# Patient Record
Sex: Female | Born: 1935 | Race: White | Hispanic: No | Marital: Married
Health system: Midwestern US, Community
[De-identification: ages and names within clinical notes are randomized; demographics above are authoritative.]

## PROBLEM LIST (undated history)

## (undated) DIAGNOSIS — T8549XS Other mechanical complication of breast prosthesis and implant, sequela: Secondary | ICD-10-CM

## (undated) DIAGNOSIS — D239 Other benign neoplasm of skin, unspecified: Secondary | ICD-10-CM

## (undated) DIAGNOSIS — Z9289 Personal history of other medical treatment: Secondary | ICD-10-CM

## (undated) DIAGNOSIS — R2681 Unsteadiness on feet: Secondary | ICD-10-CM

## (undated) DIAGNOSIS — F32A Depression, unspecified: Secondary | ICD-10-CM

## (undated) DIAGNOSIS — R002 Palpitations: Secondary | ICD-10-CM

## (undated) DIAGNOSIS — G8929 Other chronic pain: Secondary | ICD-10-CM

## (undated) DIAGNOSIS — R918 Other nonspecific abnormal finding of lung field: Secondary | ICD-10-CM

## (undated) DIAGNOSIS — D126 Benign neoplasm of colon, unspecified: Secondary | ICD-10-CM

## (undated) DIAGNOSIS — N390 Urinary tract infection, site not specified: Secondary | ICD-10-CM

## (undated) DIAGNOSIS — D249 Benign neoplasm of unspecified breast: Secondary | ICD-10-CM

## (undated) DIAGNOSIS — F419 Anxiety disorder, unspecified: Secondary | ICD-10-CM

## (undated) DIAGNOSIS — C801 Malignant (primary) neoplasm, unspecified: Secondary | ICD-10-CM

## (undated) DIAGNOSIS — R251 Tremor, unspecified: Secondary | ICD-10-CM

## (undated) DIAGNOSIS — A0472 Enterocolitis due to Clostridium difficile, not specified as recurrent: Secondary | ICD-10-CM

## (undated) DIAGNOSIS — F329 Major depressive disorder, single episode, unspecified: Secondary | ICD-10-CM

## (undated) DIAGNOSIS — R634 Abnormal weight loss: Secondary | ICD-10-CM

## (undated) DIAGNOSIS — I10 Essential (primary) hypertension: Secondary | ICD-10-CM

## (undated) DIAGNOSIS — J00 Acute nasopharyngitis [common cold]: Secondary | ICD-10-CM

## (undated) DIAGNOSIS — I509 Heart failure, unspecified: Secondary | ICD-10-CM

## (undated) DIAGNOSIS — N12 Tubulo-interstitial nephritis, not specified as acute or chronic: Secondary | ICD-10-CM

## (undated) DIAGNOSIS — E119 Type 2 diabetes mellitus without complications: Secondary | ICD-10-CM

## (undated) DIAGNOSIS — Z923 Personal history of irradiation: Secondary | ICD-10-CM

## (undated) DIAGNOSIS — S2239XA Fracture of one rib, unspecified side, initial encounter for closed fracture: Secondary | ICD-10-CM

## (undated) DIAGNOSIS — R109 Unspecified abdominal pain: Secondary | ICD-10-CM

## (undated) DIAGNOSIS — C3490 Malignant neoplasm of unspecified part of unspecified bronchus or lung: Secondary | ICD-10-CM

## (undated) DIAGNOSIS — S2249XA Multiple fractures of ribs, unspecified side, initial encounter for closed fracture: Secondary | ICD-10-CM

## (undated) DIAGNOSIS — J449 Chronic obstructive pulmonary disease, unspecified: Secondary | ICD-10-CM

## (undated) DIAGNOSIS — N200 Calculus of kidney: Secondary | ICD-10-CM

## (undated) DIAGNOSIS — K219 Gastro-esophageal reflux disease without esophagitis: Secondary | ICD-10-CM

## (undated) DIAGNOSIS — E785 Hyperlipidemia, unspecified: Secondary | ICD-10-CM

## (undated) DIAGNOSIS — R0602 Shortness of breath: Secondary | ICD-10-CM

## (undated) DIAGNOSIS — M1990 Unspecified osteoarthritis, unspecified site: Secondary | ICD-10-CM

## (undated) DIAGNOSIS — K5790 Diverticulosis of intestine, part unspecified, without perforation or abscess without bleeding: Secondary | ICD-10-CM

## (undated) DIAGNOSIS — K458 Other specified abdominal hernia without obstruction or gangrene: Secondary | ICD-10-CM

## (undated) DIAGNOSIS — M5136 Other intervertebral disc degeneration, lumbar region: Secondary | ICD-10-CM

## (undated) DIAGNOSIS — S32009A Unspecified fracture of unspecified lumbar vertebra, initial encounter for closed fracture: Secondary | ICD-10-CM

## (undated) DIAGNOSIS — M5416 Radiculopathy, lumbar region: Secondary | ICD-10-CM

## (undated) DIAGNOSIS — M810 Age-related osteoporosis without current pathological fracture: Secondary | ICD-10-CM

## (undated) HISTORY — DX: Malignant (primary) neoplasm, unspecified: C80.1

## (undated) HISTORY — PX: TONSILLECTOMY: SUR1361

## (undated) HISTORY — DX: Benign neoplasm of colon, unspecified: D12.6

## (undated) HISTORY — DX: Benign neoplasm of unspecified breast: D24.9

## (undated) HISTORY — DX: Tubulo-interstitial nephritis, not specified as acute or chronic: N12

## (undated) HISTORY — PX: EYE SURGERY: SHX253

## (undated) HISTORY — DX: Multiple fractures of ribs, unspecified side, initial encounter for closed fracture: S22.49XA

## (undated) HISTORY — DX: Personal history of other medical treatment: Z92.89

## (undated) HISTORY — DX: Anxiety disorder, unspecified: F41.9

## (undated) HISTORY — PX: TUBAL LIGATION: SHX77

## (undated) HISTORY — DX: Other benign neoplasm of skin, unspecified: D23.9

## (undated) HISTORY — DX: Hyperlipidemia, unspecified: E78.5

## (undated) HISTORY — DX: Unspecified osteoarthritis, unspecified site: M19.90

## (undated) HISTORY — DX: Fracture of one rib, unspecified side, initial encounter for closed fracture: S22.39XA

## (undated) HISTORY — DX: Chronic obstructive pulmonary disease, unspecified: J44.9

## (undated) HISTORY — DX: Personal history of irradiation: Z92.3

## (undated) HISTORY — DX: Diverticulosis of intestine, part unspecified, without perforation or abscess without bleeding: K57.90

## (undated) HISTORY — DX: Type 2 diabetes mellitus without complications: E11.9

## (undated) HISTORY — DX: Calculus of kidney: N20.0

## (undated) HISTORY — DX: Malignant neoplasm of unspecified part of unspecified bronchus or lung: C34.90

---

## 1961-02-21 HISTORY — PX: APPENDECTOMY: SHX54

## 1968-10-22 HISTORY — PX: BREAST SURGERY: SHX581

## 1970-02-21 HISTORY — PX: ABDOMINAL HYSTERECTOMY: SHX81

## 1970-02-21 HISTORY — PX: EXPLORATORY LAPAROTOMY: SUR591

## 1981-02-21 DIAGNOSIS — N200 Calculus of kidney: Secondary | ICD-10-CM

## 1981-02-21 HISTORY — DX: Calculus of kidney: N20.0

## 2000-05-09 ENCOUNTER — Ambulatory Visit (HOSPITAL_COMMUNITY): Admission: RE | Admit: 2000-05-09 | Discharge: 2000-05-09 | Payer: Self-pay | Admitting: Pulmonary Disease

## 2000-08-29 ENCOUNTER — Ambulatory Visit (HOSPITAL_COMMUNITY): Admission: RE | Admit: 2000-08-29 | Discharge: 2000-08-29 | Payer: Self-pay | Admitting: Pulmonary Disease

## 2001-04-13 ENCOUNTER — Emergency Department (HOSPITAL_COMMUNITY): Admission: EM | Admit: 2001-04-13 | Discharge: 2001-04-13 | Payer: Self-pay | Admitting: Emergency Medicine

## 2001-04-23 ENCOUNTER — Ambulatory Visit (HOSPITAL_COMMUNITY): Admission: RE | Admit: 2001-04-23 | Discharge: 2001-04-23 | Payer: Self-pay | Admitting: Family Medicine

## 2001-04-23 ENCOUNTER — Encounter: Payer: Self-pay | Admitting: Family Medicine

## 2001-09-27 ENCOUNTER — Ambulatory Visit (HOSPITAL_COMMUNITY): Admission: RE | Admit: 2001-09-27 | Discharge: 2001-09-27 | Payer: Self-pay | Admitting: Pulmonary Disease

## 2001-11-01 ENCOUNTER — Ambulatory Visit (HOSPITAL_COMMUNITY): Admission: RE | Admit: 2001-11-01 | Discharge: 2001-11-01 | Payer: Self-pay | Admitting: Pulmonary Disease

## 2002-02-21 ENCOUNTER — Encounter (INDEPENDENT_AMBULATORY_CARE_PROVIDER_SITE_OTHER): Payer: Self-pay | Admitting: *Deleted

## 2002-02-21 DIAGNOSIS — D249 Benign neoplasm of unspecified breast: Secondary | ICD-10-CM

## 2002-02-21 HISTORY — DX: Benign neoplasm of unspecified breast: D24.9

## 2003-05-08 ENCOUNTER — Ambulatory Visit (HOSPITAL_COMMUNITY): Admission: RE | Admit: 2003-05-08 | Discharge: 2003-05-08 | Payer: Self-pay | Admitting: Pulmonary Disease

## 2003-05-23 DIAGNOSIS — Z9289 Personal history of other medical treatment: Secondary | ICD-10-CM

## 2003-05-23 HISTORY — DX: Personal history of other medical treatment: Z92.89

## 2003-05-24 ENCOUNTER — Emergency Department (HOSPITAL_COMMUNITY): Admission: EM | Admit: 2003-05-24 | Discharge: 2003-05-24 | Payer: Self-pay | Admitting: Emergency Medicine

## 2003-05-31 ENCOUNTER — Inpatient Hospital Stay (HOSPITAL_COMMUNITY): Admission: EM | Admit: 2003-05-31 | Discharge: 2003-06-03 | Payer: Self-pay | Admitting: Emergency Medicine

## 2003-06-02 ENCOUNTER — Encounter (INDEPENDENT_AMBULATORY_CARE_PROVIDER_SITE_OTHER): Payer: Self-pay | Admitting: *Deleted

## 2003-06-04 ENCOUNTER — Encounter: Admission: RE | Admit: 2003-06-04 | Discharge: 2003-06-04 | Payer: Self-pay | Admitting: Family Medicine

## 2003-07-01 ENCOUNTER — Encounter: Admission: RE | Admit: 2003-07-01 | Discharge: 2003-07-01 | Payer: Self-pay | Admitting: Sports Medicine

## 2003-08-07 ENCOUNTER — Encounter: Admission: RE | Admit: 2003-08-07 | Discharge: 2003-08-07 | Payer: Self-pay | Admitting: Family Medicine

## 2003-08-12 ENCOUNTER — Encounter: Admission: RE | Admit: 2003-08-12 | Discharge: 2003-08-12 | Payer: Self-pay | Admitting: Family Medicine

## 2003-08-15 ENCOUNTER — Encounter: Admission: RE | Admit: 2003-08-15 | Discharge: 2003-08-15 | Payer: Self-pay | Admitting: Family Medicine

## 2003-08-29 ENCOUNTER — Encounter: Admission: RE | Admit: 2003-08-29 | Discharge: 2003-08-29 | Payer: Self-pay | Admitting: Sports Medicine

## 2003-09-15 ENCOUNTER — Encounter: Admission: RE | Admit: 2003-09-15 | Discharge: 2003-09-15 | Payer: Self-pay | Admitting: Family Medicine

## 2003-10-22 ENCOUNTER — Encounter: Admission: RE | Admit: 2003-10-22 | Discharge: 2003-10-22 | Payer: Self-pay | Admitting: Family Medicine

## 2003-11-27 ENCOUNTER — Ambulatory Visit: Payer: Self-pay | Admitting: Family Medicine

## 2003-12-08 ENCOUNTER — Ambulatory Visit: Payer: Self-pay | Admitting: Sports Medicine

## 2003-12-09 ENCOUNTER — Ambulatory Visit (HOSPITAL_COMMUNITY): Admission: RE | Admit: 2003-12-09 | Discharge: 2003-12-09 | Payer: Self-pay | Admitting: Sports Medicine

## 2003-12-26 ENCOUNTER — Ambulatory Visit: Payer: Self-pay | Admitting: Family Medicine

## 2004-03-03 ENCOUNTER — Ambulatory Visit: Payer: Self-pay | Admitting: Family Medicine

## 2004-08-06 ENCOUNTER — Ambulatory Visit: Payer: Self-pay | Admitting: Family Medicine

## 2004-11-08 ENCOUNTER — Ambulatory Visit: Payer: Self-pay | Admitting: Sports Medicine

## 2004-11-23 ENCOUNTER — Ambulatory Visit: Payer: Self-pay | Admitting: Family Medicine

## 2004-12-31 ENCOUNTER — Ambulatory Visit: Payer: Self-pay | Admitting: Family Medicine

## 2005-02-02 ENCOUNTER — Ambulatory Visit: Payer: Self-pay | Admitting: Family Medicine

## 2005-04-06 ENCOUNTER — Encounter: Admission: RE | Admit: 2005-04-06 | Discharge: 2005-04-06 | Payer: Self-pay | Admitting: Sports Medicine

## 2005-04-06 ENCOUNTER — Ambulatory Visit: Payer: Self-pay | Admitting: Family Medicine

## 2005-04-15 ENCOUNTER — Ambulatory Visit: Payer: Self-pay | Admitting: Family Medicine

## 2005-05-25 ENCOUNTER — Ambulatory Visit: Payer: Self-pay | Admitting: Family Medicine

## 2005-07-11 ENCOUNTER — Ambulatory Visit: Payer: Self-pay | Admitting: Family Medicine

## 2005-10-18 ENCOUNTER — Ambulatory Visit: Payer: Self-pay | Admitting: Family Medicine

## 2005-11-03 ENCOUNTER — Ambulatory Visit: Payer: Self-pay | Admitting: Family Medicine

## 2005-11-08 ENCOUNTER — Ambulatory Visit: Payer: Self-pay | Admitting: Family Medicine

## 2005-11-08 ENCOUNTER — Ambulatory Visit: Payer: Self-pay

## 2005-11-21 ENCOUNTER — Ambulatory Visit: Payer: Self-pay | Admitting: Family Medicine

## 2006-02-07 ENCOUNTER — Ambulatory Visit: Payer: Self-pay | Admitting: Family Medicine

## 2006-02-23 ENCOUNTER — Ambulatory Visit: Payer: Self-pay | Admitting: Family Medicine

## 2006-04-03 ENCOUNTER — Ambulatory Visit: Payer: Self-pay | Admitting: Family Medicine

## 2006-04-20 DIAGNOSIS — M810 Age-related osteoporosis without current pathological fracture: Secondary | ICD-10-CM | POA: Insufficient documentation

## 2006-04-20 DIAGNOSIS — E669 Obesity, unspecified: Secondary | ICD-10-CM | POA: Insufficient documentation

## 2006-04-20 DIAGNOSIS — N3941 Urge incontinence: Secondary | ICD-10-CM

## 2006-04-20 DIAGNOSIS — G4700 Insomnia, unspecified: Secondary | ICD-10-CM | POA: Insufficient documentation

## 2006-04-20 DIAGNOSIS — K589 Irritable bowel syndrome without diarrhea: Secondary | ICD-10-CM | POA: Insufficient documentation

## 2006-04-20 DIAGNOSIS — M1990 Unspecified osteoarthritis, unspecified site: Secondary | ICD-10-CM | POA: Insufficient documentation

## 2006-04-20 DIAGNOSIS — D509 Iron deficiency anemia, unspecified: Secondary | ICD-10-CM

## 2006-04-20 DIAGNOSIS — K219 Gastro-esophageal reflux disease without esophagitis: Secondary | ICD-10-CM

## 2006-04-21 ENCOUNTER — Encounter (INDEPENDENT_AMBULATORY_CARE_PROVIDER_SITE_OTHER): Payer: Self-pay | Admitting: *Deleted

## 2006-05-11 ENCOUNTER — Encounter: Payer: Self-pay | Admitting: Family Medicine

## 2006-05-11 DIAGNOSIS — I872 Venous insufficiency (chronic) (peripheral): Secondary | ICD-10-CM

## 2006-05-11 DIAGNOSIS — M1990 Unspecified osteoarthritis, unspecified site: Secondary | ICD-10-CM | POA: Insufficient documentation

## 2006-05-11 DIAGNOSIS — F411 Generalized anxiety disorder: Secondary | ICD-10-CM | POA: Insufficient documentation

## 2006-05-11 DIAGNOSIS — I10 Essential (primary) hypertension: Secondary | ICD-10-CM | POA: Insufficient documentation

## 2006-05-11 DIAGNOSIS — J449 Chronic obstructive pulmonary disease, unspecified: Secondary | ICD-10-CM | POA: Insufficient documentation

## 2006-05-11 DIAGNOSIS — E785 Hyperlipidemia, unspecified: Secondary | ICD-10-CM

## 2006-05-11 DIAGNOSIS — M6530 Trigger finger, unspecified finger: Secondary | ICD-10-CM | POA: Insufficient documentation

## 2006-06-21 ENCOUNTER — Ambulatory Visit: Payer: Self-pay | Admitting: Family Medicine

## 2006-06-23 LAB — CONVERTED CEMR LAB
ALT: 14 units/L (ref 0–40)
Bilirubin, Direct: 0.2 mg/dL (ref 0.0–0.3)
CO2: 30 meq/L (ref 19–32)
Calcium: 9.6 mg/dL (ref 8.4–10.5)
Creatinine, Ser: 0.7 mg/dL (ref 0.4–1.2)
GFR calc Af Amer: 107 mL/min
HDL: 43.9 mg/dL (ref >39.0)
Sodium: 144 meq/L (ref 135–145)
Total Protein: 6 g/dL (ref 6.0–8.3)
Triglycerides: 110 mg/dL (ref 0–149)

## 2006-06-26 ENCOUNTER — Ambulatory Visit: Payer: Self-pay | Admitting: Family Medicine

## 2006-06-26 LAB — CONVERTED CEMR LAB
HDL goal, serum: 40 mg/dL
LDL Goal: 100 mg/dL

## 2006-08-08 ENCOUNTER — Encounter: Admission: RE | Admit: 2006-08-08 | Discharge: 2006-08-08 | Payer: Self-pay | Admitting: Family Medicine

## 2006-08-17 ENCOUNTER — Encounter (INDEPENDENT_AMBULATORY_CARE_PROVIDER_SITE_OTHER): Payer: Self-pay | Admitting: *Deleted

## 2007-01-01 ENCOUNTER — Ambulatory Visit: Payer: Self-pay | Admitting: Family Medicine

## 2007-07-02 ENCOUNTER — Ambulatory Visit: Payer: Self-pay | Admitting: Family Medicine

## 2007-08-30 ENCOUNTER — Ambulatory Visit: Payer: Self-pay | Admitting: Family Medicine

## 2007-08-31 DIAGNOSIS — E119 Type 2 diabetes mellitus without complications: Secondary | ICD-10-CM

## 2007-08-31 LAB — CONVERTED CEMR LAB
ALT: 19 units/L (ref 0–35)
Albumin: 3.8 g/dL (ref 3.5–5.2)
Bilirubin, Direct: 0.1 mg/dL (ref 0.0–0.3)
CO2: 31 meq/L (ref 19–32)
Calcium: 9.4 mg/dL (ref 8.4–10.5)
Creatinine, Ser: 0.9 mg/dL (ref 0.4–1.2)
Direct LDL: 179.2 mg/dL
GFR calc Af Amer: 80 mL/min
Glucose, Bld: 130 mg/dL — ABNORMAL HIGH (ref 70–99)
VLDL: 21 mg/dL (ref 0–40)

## 2007-09-07 ENCOUNTER — Ambulatory Visit: Payer: Self-pay | Admitting: Family Medicine

## 2007-09-11 LAB — CONVERTED CEMR LAB
Creatinine, Urine: 143.5 mg/dL
Hgb A1c MFr Bld: 6.5 % — ABNORMAL HIGH (ref 4.6–6.1)
Microalb Creat Ratio: 5 mg/g (ref 0.0–30.0)
Microalb, Ur: 0.72 mg/dL (ref 0.00–1.89)

## 2007-12-05 ENCOUNTER — Ambulatory Visit: Payer: Self-pay | Admitting: Family Medicine

## 2007-12-05 LAB — CONVERTED CEMR LAB: Cholesterol: 225 mg/dL (ref 0–200)

## 2007-12-07 ENCOUNTER — Telehealth (INDEPENDENT_AMBULATORY_CARE_PROVIDER_SITE_OTHER): Payer: Self-pay | Admitting: Internal Medicine

## 2007-12-14 ENCOUNTER — Ambulatory Visit: Payer: Self-pay | Admitting: Family Medicine

## 2008-02-22 LAB — HM DIABETES EYE EXAM: HM Diabetic Eye Exam: NORMAL

## 2008-03-11 ENCOUNTER — Ambulatory Visit: Payer: Self-pay | Admitting: Family Medicine

## 2008-03-13 LAB — CONVERTED CEMR LAB
ALT: 13 units/L (ref 0–35)
AST: 18 units/L (ref 0–37)
Albumin: 4 g/dL (ref 3.5–5.2)
Bilirubin, Direct: 0.1 mg/dL (ref 0.0–0.3)
Direct LDL: 161.1 mg/dL
Glucose, Bld: 137 mg/dL — ABNORMAL HIGH (ref 70–99)
HDL: 50.6 mg/dL (ref >39.0)
Hgb A1c MFr Bld: 6.1 % — ABNORMAL HIGH (ref 4.6–6.0)
Potassium: 4.4 meq/L (ref 3.5–5.1)
Sodium: 142 meq/L (ref 135–145)
VLDL: 31 mg/dL (ref 0–40)

## 2008-03-17 ENCOUNTER — Ambulatory Visit: Payer: Self-pay | Admitting: Family Medicine

## 2008-04-08 ENCOUNTER — Encounter (INDEPENDENT_AMBULATORY_CARE_PROVIDER_SITE_OTHER): Payer: Self-pay | Admitting: *Deleted

## 2008-06-09 ENCOUNTER — Ambulatory Visit: Payer: Self-pay | Admitting: Family Medicine

## 2008-06-09 LAB — CONVERTED CEMR LAB: LDL Cholesterol: 147 mg/dL

## 2008-06-11 ENCOUNTER — Telehealth: Payer: Self-pay | Admitting: Family Medicine

## 2008-06-13 LAB — CONVERTED CEMR LAB
AST: 20 units/L (ref 0–37)
Albumin: 3.9 g/dL (ref 3.5–5.2)
BUN: 16 mg/dL (ref 6–23)
CO2: 31 meq/L (ref 19–32)
Chloride: 110 meq/L (ref 96–112)
Cholesterol: 221 mg/dL — ABNORMAL HIGH (ref 0–200)
Direct LDL: 147.7 mg/dL
GFR calc non Af Amer: 75.12 mL/min (ref >60)
Glucose, Bld: 130 mg/dL — ABNORMAL HIGH (ref 70–99)
Hgb A1c MFr Bld: 6.3 % (ref 4.6–6.5)
Sodium: 145 meq/L (ref 135–145)
Triglycerides: 94 mg/dL (ref 0.0–149.0)

## 2008-06-18 ENCOUNTER — Ambulatory Visit: Payer: Self-pay | Admitting: Family Medicine

## 2008-07-18 ENCOUNTER — Ambulatory Visit: Payer: Self-pay | Admitting: Family Medicine

## 2008-07-22 ENCOUNTER — Encounter: Payer: Self-pay | Admitting: Family Medicine

## 2008-08-07 ENCOUNTER — Telehealth: Payer: Self-pay | Admitting: Family Medicine

## 2008-09-12 ENCOUNTER — Ambulatory Visit: Payer: Self-pay | Admitting: Family Medicine

## 2008-09-24 ENCOUNTER — Ambulatory Visit: Payer: Self-pay | Admitting: Family Medicine

## 2008-09-24 DIAGNOSIS — D126 Benign neoplasm of colon, unspecified: Secondary | ICD-10-CM | POA: Insufficient documentation

## 2008-11-11 ENCOUNTER — Ambulatory Visit: Payer: Self-pay | Admitting: Family Medicine

## 2008-11-12 ENCOUNTER — Ambulatory Visit: Payer: Self-pay | Admitting: Family Medicine

## 2008-12-03 ENCOUNTER — Ambulatory Visit: Payer: Self-pay | Admitting: Family Medicine

## 2008-12-10 ENCOUNTER — Telehealth: Payer: Self-pay | Admitting: Family Medicine

## 2008-12-11 ENCOUNTER — Encounter: Payer: Self-pay | Admitting: Family Medicine

## 2008-12-15 ENCOUNTER — Encounter: Payer: Self-pay | Admitting: Family Medicine

## 2008-12-16 ENCOUNTER — Telehealth: Payer: Self-pay | Admitting: Family Medicine

## 2008-12-17 ENCOUNTER — Encounter: Payer: Self-pay | Admitting: Family Medicine

## 2009-03-04 ENCOUNTER — Telehealth: Payer: Self-pay | Admitting: Internal Medicine

## 2009-03-09 ENCOUNTER — Emergency Department (HOSPITAL_COMMUNITY): Admission: EM | Admit: 2009-03-09 | Discharge: 2009-03-09 | Payer: Self-pay | Admitting: Emergency Medicine

## 2009-03-09 ENCOUNTER — Ambulatory Visit: Payer: Self-pay | Admitting: Family Medicine

## 2009-03-17 ENCOUNTER — Ambulatory Visit: Payer: Self-pay | Admitting: Family Medicine

## 2009-03-26 ENCOUNTER — Ambulatory Visit: Payer: Self-pay | Admitting: Family Medicine

## 2009-03-26 LAB — CONVERTED CEMR LAB
Alkaline Phosphatase: 68 units/L (ref 39–117)
Bilirubin, Direct: 0.1 mg/dL (ref 0.0–0.3)
Calcium: 9.6 mg/dL (ref 8.4–10.5)
GFR calc non Af Amer: 65.43 mL/min (ref >60)
HDL: 63.8 mg/dL (ref >39.00)
Sodium: 141 meq/L (ref 135–145)
Total CHOL/HDL Ratio: 4
Triglycerides: 135 mg/dL (ref 0.0–149.0)

## 2009-04-03 ENCOUNTER — Encounter (INDEPENDENT_AMBULATORY_CARE_PROVIDER_SITE_OTHER): Payer: Self-pay | Admitting: *Deleted

## 2009-04-03 ENCOUNTER — Ambulatory Visit: Payer: Self-pay | Admitting: Family Medicine

## 2009-04-14 ENCOUNTER — Encounter: Admission: RE | Admit: 2009-04-14 | Discharge: 2009-04-14 | Payer: Self-pay | Admitting: Family Medicine

## 2009-04-15 LAB — HM MAMMOGRAPHY: HM Mammogram: NORMAL

## 2009-04-20 ENCOUNTER — Encounter (INDEPENDENT_AMBULATORY_CARE_PROVIDER_SITE_OTHER): Payer: Self-pay

## 2009-04-21 ENCOUNTER — Ambulatory Visit: Payer: Self-pay | Admitting: Internal Medicine

## 2009-05-05 ENCOUNTER — Ambulatory Visit: Payer: Self-pay | Admitting: Internal Medicine

## 2009-06-11 ENCOUNTER — Ambulatory Visit: Payer: Self-pay | Admitting: Family Medicine

## 2009-06-25 ENCOUNTER — Ambulatory Visit: Payer: Self-pay | Admitting: Family Medicine

## 2009-06-29 LAB — CONVERTED CEMR LAB
Albumin: 3.9 g/dL (ref 3.5–5.2)
BUN: 14 mg/dL (ref 6–23)
Bilirubin, Direct: 0.1 mg/dL (ref 0.0–0.3)
CO2: 31 meq/L (ref 19–32)
Cholesterol: 242 mg/dL — ABNORMAL HIGH (ref 0–200)
Direct LDL: 165.1 mg/dL
GFR calc non Af Amer: 83.25 mL/min (ref >60)
Total CHOL/HDL Ratio: 4
Triglycerides: 101 mg/dL (ref 0.0–149.0)
VLDL: 20.2 mg/dL (ref 0.0–40.0)

## 2009-07-03 ENCOUNTER — Ambulatory Visit: Payer: Self-pay | Admitting: Family Medicine

## 2009-07-23 ENCOUNTER — Ambulatory Visit: Payer: Self-pay | Admitting: Family Medicine

## 2009-09-29 ENCOUNTER — Ambulatory Visit: Payer: Self-pay | Admitting: Family Medicine

## 2009-10-02 LAB — CONVERTED CEMR LAB
ALT: 18 units/L (ref 0–35)
Albumin: 4 g/dL (ref 3.5–5.2)
BUN: 18 mg/dL (ref 6–23)
CO2: 29 meq/L (ref 19–32)
Chloride: 100 meq/L (ref 96–112)
Cholesterol: 234 mg/dL — ABNORMAL HIGH (ref 0–200)
Creatinine, Ser: 0.9 mg/dL (ref 0.4–1.2)
Glucose, Bld: 166 mg/dL — ABNORMAL HIGH (ref 70–99)
Hgb A1c MFr Bld: 7.4 % — ABNORMAL HIGH (ref 4.6–6.5)
Microalb, Ur: 0.6 mg/dL (ref 0.0–1.9)
Total Bilirubin: 0.8 mg/dL (ref 0.3–1.2)
Total Protein: 6.5 g/dL (ref 6.0–8.3)
VLDL: 39 mg/dL (ref 0.0–40.0)

## 2009-10-06 ENCOUNTER — Ambulatory Visit: Payer: Self-pay | Admitting: Family Medicine

## 2009-11-11 ENCOUNTER — Ambulatory Visit: Payer: Self-pay | Admitting: Family Medicine

## 2010-01-04 ENCOUNTER — Ambulatory Visit: Payer: Self-pay | Admitting: Family Medicine

## 2010-01-18 ENCOUNTER — Ambulatory Visit: Payer: Self-pay | Admitting: Family Medicine

## 2010-02-23 ENCOUNTER — Ambulatory Visit
Admission: RE | Admit: 2010-02-23 | Discharge: 2010-02-23 | Payer: Self-pay | Source: Home / Self Care | Attending: Family Medicine | Admitting: Family Medicine

## 2010-02-23 LAB — HM DIABETES FOOT EXAM

## 2010-03-02 ENCOUNTER — Ambulatory Visit
Admission: RE | Admit: 2010-03-02 | Discharge: 2010-03-02 | Payer: Self-pay | Source: Home / Self Care | Attending: Family Medicine | Admitting: Family Medicine

## 2010-03-14 ENCOUNTER — Encounter: Payer: Self-pay | Admitting: Family Medicine

## 2010-03-23 NOTE — Procedures (Signed)
Summary: Colonoscopy  Patient: [name] Note: All result statuses are Final unless otherwise noted.  Tests: (1) Colonoscopy (COL)   COL Colonoscopy           DONE     [facility]     [address]     [place], [place]  [address]           COLONOSCOPY PROCEDURE REPORT           PATIENT:  Marie Holmes, Marie Holmes  MR#:  [id number]     BIRTHDATE:  January 12, 1936, 74 yrs. old  GENDER:  female           ENDOSCOPIST:  Hedwig Morton. Juanda Chance, MD     Referred by:           PROCEDURE DATE:  05/05/2009     PROCEDURE:  Colonoscopy [address]56[address]     ASA CLASS:  Class I     INDICATIONS:  ADENOMATOUS POLYP IN 2005     PRIOR COLON 1990           MEDICATIONS:   Versed 12 mg, Fentanyl 100 mcg           DESCRIPTION OF PROCEDURE:   After the risks benefits and     alternatives of the procedure were thoroughly explained, informed     consent was obtained.  Digital rectal exam was performed and     revealed no rectal masses.   The LB [id number] endoscope     was introduced through the anus and advanced to the cecum, which     was identified by both the appendix and ileocecal valve, without     limitations.  The quality of the prep was good, using MiraLax.     The instrument was then slowly withdrawn as the colon was fully     examined.     <<PROCEDUREIMAGES>>           FINDINGS:  Moderate diverticulosis was found in the sigmoid colon     (see image1 and image3). DIFFICULT TURN IN THE SIGMOID COLON, WE     HAD TO SWITCH TO A PEDIATRIC SCOPE  This was otherwise a normal     examination of the colon (see image4 and image2).   Retroflexed     views in the rectum revealed no abnormalities.    The scope was     then withdrawn from the patient and the procedure completed.           COMPLICATIONS:  None           ENDOSCOPIC IMPRESSION:     1) Moderate diverticulosis in the sigmoid colon     2) Otherwise normal examination     RECOMMENDATIONS:     1) high fiber diet           REPEAT EXAM:  In 10  year(s) for.           ______________________________     Hedwig Morton. Juanda Chance, MD           CC:           n.     eSIGNED:   Hedwig Morton. Loyde Orth at 05/05/2009 12:31 PM           [place] Nap, [phone]  Note: An exclamation mark (!) indicates a result that was not dispersed into the flowsheet. Document Creation Date: 05/05/2009 12:31 PM _______________________________________________________________________  (1) Order result status: Final Collection or observation date-time: 05/05/2009 12:25 Requested date-time:  Receipt date-time:  Reported date-time:  Referring Physician:   Ordering Physician: Lina Sar [phone]) Specimen Source:  Source: Launa Grill Order Number: [id number] Lab site:   Appended Document: Colonoscopy    Clinical Lists Changes  Observations: Added new observation of COLONNXTDUE: 04/2019 (05/05/2009 13:01)
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
## 2010-03-23 NOTE — Letter (Signed)
Summary: [facility] Instructions  [facility]  [address] [place], [place] [address]   Phone: [phone]  Fax: [phone]       Marie Holmes    1935-11-30    MRN: [id number]       Procedure Day /Date: Tuesday 05/05/09     Arrival Time:  10:30 am     Procedure Time: 11:30 am     Location of Procedure:                    _x_  [facility] (4th Floor)    PREPARATION FOR COLONOSCOPY WITH MIRALAX  Starting 5 days prior to your procedure 04/30/09 do not eat nuts, seeds, popcorn, corn, beans, peas,  salads, or any raw vegetables.  Do not take any fiber supplements (e.g. Metamucil, Citrucel, and Benefiber). ____________________________________________________________________________________________________   THE DAY BEFORE YOUR PROCEDURE         DATE: 05/04/09  DAY: Monday  1   Drink clear liquids the entire day-NO SOLID FOOD  2   Do not drink anything colored red or purple.  Avoid juices with pulp.  No orange juice.  3   Drink at least 64 oz. (8 glasses) of fluid/clear liquids during the day to prevent dehydration and help the prep work efficiently.  CLEAR LIQUIDS INCLUDE: Water Jello Ice Popsicles Tea (sugar ok, no milk/cream) Powdered fruit flavored drinks Coffee (sugar ok, no milk/cream) Gatorade Juice: apple, white grape, white cranberry  Lemonade Clear bullion, consomm, broth Carbonated beverages (any kind) Strained chicken noodle soup Hard Candy  4   Mix the entire bottle of Miralax with 64 oz. of Gatorade/Powerade in the morning and put in the refrigerator to chill.  5   At 3:00 pm take 2 Dulcolax/Bisacodyl tablets.  6   At 4:30 pm take one Reglan/Metoclopramide tablet.  7  Starting at 5:00 pm drink one 8 oz glass of the Miralax mixture every 15-20 minutes until you have finished drinking the entire 64 oz.  You should finish drinking prep around 7:30 or 8:00 pm.  8   If you are nauseated, you may take the 2nd Reglan/Metoclopramide  tablet at 6:30 pm.        9    At 8:00 pm take 2 more DULCOLAX/Bisacodyl tablets.     THE DAY OF YOUR PROCEDURE      DATE:  05/05/09  DAY: Tuesday  You may drink clear liquids until  9:30 am (2 HOURS BEFORE PROCEDURE).   MEDICATION INSTRUCTIONS  Unless otherwise instructed, you should take regular prescription medications with a small sip of water as early as possible the morning of your procedure.         OTHER INSTRUCTIONS  You will need a responsible adult at least 73 years of age to accompany you and drive you home.   This person must remain in the waiting room during your procedure.  Wear loose fitting clothing that is easily removed.  Leave jewelry and other valuables at home.  However, you may wish to bring a book to read or an iPod/MP3 player to listen to music as you wait for your procedure to start.  Remove all body piercing jewelry and leave at home.  Total time from sign-in until discharge is approximately 2-3 hours.  You should go home directly after your procedure and rest.  You can resume normal activities the day after your procedure.  The day of your procedure you should not:   Drive  Make legal decisions   Operate machinery   Drink alcohol   Return to work  You will receive specific instructions about eating, activities and medications before you leave.   The above instructions have been reviewed and explained to me by   Ulis Rias RN  April 21, 2009 12:59 PM     I fully understand and can verbalize these instructions _____________________________ Date _______

## 2010-03-23 NOTE — Miscellaneous (Signed)
Summary: Lec previsit  Clinical Lists Changes  Medications: Added new medication of MIRALAX   POWD (POLYETHYLENE GLYCOL 3350) As per prep  instructions. - Signed Added new medication of REGLAN 10 MG  TABS (METOCLOPRAMIDE HCL) As per prep instructions. - Signed Added new medication of DULCOLAX 5 MG  TBEC (BISACODYL) Day before procedure take 2 at 3pm and 2 at 8pm. - Signed Rx of MIRALAX   POWD (POLYETHYLENE GLYCOL 3350) As per prep  instructions.;  #255gm x 0;  Signed;  Entered by: Ulis Rias RN;  Authorized by: Hart Carwin MD;  Method used: Electronically to [facility] [address], [place], [place], [place]  [address], Ph: [phone], Fax: [phone] Rx of REGLAN 10 MG  TABS (METOCLOPRAMIDE HCL) As per prep instructions.;  #2 x 0;  Signed;  Entered by: Ulis Rias RN;  Authorized by: Hart Carwin MD;  Method used: Electronically to [facility] [address] [place], [place], [place]  [address], Ph: [phone], Fax: [phone] Rx of DULCOLAX 5 MG  TBEC (BISACODYL) Day before procedure take 2 at 3pm and 2 at 8pm.;  #4 x 0;  Signed;  Entered by: Ulis Rias RN;  Authorized by: Hart Carwin MD;  Method used: Electronically to [facility] [address] [place], [place], [place]  [address], Ph: [phone], Fax: [phone] Observations: Added new observation of ALLERGY REV: Done (04/21/2009 12:39)    Prescriptions: DULCOLAX 5 MG  TBEC (BISACODYL) Day before procedure take 2 at 3pm and 2 at 8pm.  #4 x 0   Entered by:   Ulis Rias RN   Authorized by:   Hart Carwin MD   Signed by:   Ulis Rias RN on 04/21/2009   Method used:   Electronically to        [facility]  [address] (retail)       [address]       [place], [place]  [address]       Ph: [phone]       Fax: [phone]   RxID:   [id number] REGLAN 10 MG  TABS (METOCLOPRAMIDE HCL) As per prep instructions.  #2 x 0   Entered by:   Ulis Rias RN  Authorized by:   Hart Carwin MD   Signed by:   Ulis Rias RN on 04/21/2009   Method used:   Electronically to        [facility]  [address] (retail)       [address]       [place], [place]  [address]       Ph: [phone]       Fax: [phone]   RxID:   [id number] MIRALAX   POWD (POLYETHYLENE GLYCOL 3350) As per prep  instructions.  #255gm x 0   Entered by:   Ulis Rias RN   Authorized by:   Hart Carwin MD   Signed by:   Ulis Rias RN on 04/21/2009   Method used:   Electronically to        [facility]  [address] (retail)       [address]       [place], [place]  [address]       Ph: [phone]  Fax: [phone]   RxID:   [id number]

## 2010-03-23 NOTE — Progress Notes (Signed)
Summary: Decline to Schedule Colonoscopy   Phone Note Outgoing Call   Call placed by: Hortense Ramal CMA Duncan Dull),  March 04, 2009 1:39 PM Call placed to: Patient Summary of Call: Left message on patient's voicemail to call back. She needs to schedule her recall colonoscopy due to her previous history of adenomatous colonic polyps. Initial call taken by: Hortense Ramal CMA Duncan Dull),  March 04, 2009 1:40 PM  Follow-up for Phone Call        Patient declines to schedule colonoscopy at this time. She states that she will call back if she decides to schedule. I have advised her as to how important it is that she follow up as directed and stay up to date on her colonoscopy due to her history of adenomatous colonic polyps. Follow-up by: Hortense Ramal CMA Duncan Dull),  March 05, 2009 8:08 AM
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
## 2010-03-23 NOTE — Assessment & Plan Note (Signed)
Summary: FLU SHOT/CLE   Nurse Visit   Allergies: 1)  ! Sulfa  Immunizations Administered:  Pneumonia Vaccine:    Vaccine Type: Pneumovax    Site: right deltoid    Mfr: Merck    Dose: 0.5 ml    Route: IM    Given by: Mervin Hack CMA (AAMA)    Exp. Date: 05/06/2011    Lot #: [id number]    VIS given: 01/26/09 version given November 11, 2009.  Influenza Vaccine # 1:    Vaccine Type: Fluvax 3+    Site: left deltoid    Mfr: GlaxoSmithKline    Dose: 0.5 ml    Route: IM    Given by: Mervin Hack CMA (AAMA)    Exp. Date: 08/21/2010    Lot #: [id number]    VIS given: 09/15/09 version given November 11, 2009.  Flu Vaccine Consent Questions:    Do you have a history of severe allergic reactions to this vaccine? no    Any prior history of allergic reactions to egg and/or gelatin? no    Do you have a sensitivity to the preservative Thimersol? no    Do you have a past history of Guillan-Barre Syndrome? no    Do you currently have an acute febrile illness? no    Have you ever had a severe reaction to latex? no    Vaccine information given and explained to patient? yes    Are you currently pregnant? no  Orders Added: 1)  Pneumococcal Vaccine [90732] 2)  Admin 1st Vaccine [90471] 3)  Flu Vaccine 20yrs + [44010] 4)  Admin of Any Addtl Vaccine [27253]

## 2010-03-23 NOTE — Letter (Signed)
Summary: Previsit letter  [facility]  [address] [place], [place] [address]   Phone: [phone]  Fax: [phone]       04/03/2009 MRN: [id number]  Marie Holmes [address] [place], [place]  [address]  Dear Ms. Kennebrew,  Welcome to the Gastroenterology Division at [facility].    You are scheduled to see a nurse for your pre-procedure visit on 04-21-09 at 1PMon the 3rd floor at [facility], [address]  We ask that you try to arrive at our office 15 minutes prior to your appointment time to allow for check-in.  Your nurse visit will consist of discussing your medical and surgical history, your immediate family medical history, and your medications.    Please bring a complete list of all your medications or, if you prefer, bring the medication bottles and we will list them.  We will need to be aware of both prescribed and over the counter drugs.  We will need to know exact dosage information as well.  If you are on blood thinners (Coumadin, Plavix, Aggrenox, Ticlid, etc.) please call our office today/prior to your appointment, as we need to consult with your physician about holding your medication.   Please be prepared to read and sign documents such as consent forms, a financial agreement, and acknowledgement forms.  If necessary, and with your consent, a friend or relative is welcome to sit-in on the nurse visit with you.  Please bring your insurance card so that we may make a copy of it.  If your insurance requires a referral to see a specialist, please bring your referral form from your primary care physician.  No co-pay is required for this nurse visit.     If you cannot keep your appointment, please call [phone] to cancel or reschedule prior to your appointment date.  This allows [place] the opportunity to schedule an appointment for another patient in need of care.    Thank you for choosing  Gastroenterology for your medical needs.  We  appreciate the opportunity to care for you.  Please visit [place] at our website  to learn more about our practice.                     Sincerely.                                                                                                                   The Gastroenterology Division
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
## 2010-04-17 ENCOUNTER — Encounter: Payer: Self-pay | Admitting: Family Medicine

## 2010-04-27 ENCOUNTER — Encounter: Payer: Self-pay | Admitting: Family Medicine

## 2010-04-30 ENCOUNTER — Telehealth: Payer: Self-pay | Admitting: Family Medicine

## 2010-05-04 NOTE — Progress Notes (Signed)
Summary: Alprazolam and Citalopram  Phone Note Refill Request Message from:  Fax from Pharmacy on April 30, 2010 3:25 PM  Refills Requested: Medication #1:  CITALOPRAM HYDROBROMIDE 40 MG TABS Take 1 tablet by mouth once a day  Medication #2:  ALPRAZOLAM 0.5 MG  TABS 1 tab by mouth at bedtime as needed insomnia [facility].  Forms in your IN box if you want them.  Fax:   [phone]   Method Requested: Fax to Mail Away Pharmacy Initial call taken by: Delilah Shan CMA Duncan Dull),  April 30, 2010 3:26 PM  Follow-up for Phone Call        [facility]Consuello Masse CMA   Follow-up by: Benny Lennert CMA Duncan Dull),  April 30, 2010 3:40 PM    Prescriptions: ALPRAZOLAM 0.5 MG  TABS (ALPRAZOLAM) 1 tab by mouth at bedtime as needed insomnia  #30 x 0   Entered by:   Benny Lennert CMA (AAMA)   Authorized by:   Kerby Nora MD   Signed by:   Benny Lennert CMA (AAMA) on 04/30/2010   Method used:   Printed then faxed to ...       [facility] (mail-order)             , [place]         Ph: [phone]       Fax: [phone]   RxID:   [id number] CITALOPRAM HYDROBROMIDE 40 MG TABS (CITALOPRAM HYDROBROMIDE) Take 1 tablet by mouth once a day  #90 x 3   Entered by:   Benny Lennert CMA (AAMA)   Authorized by:   Kerby Nora MD   Signed by:   Benny Lennert CMA (AAMA) on 04/30/2010   Method used:   Faxed to ...       [facility] (mail-order)             , [place]         Ph: [phone]       Fax: [phone]   RxID:   [id number] ALPRAZOLAM 0.5 MG  TABS (ALPRAZOLAM) 1 tab by mouth at bedtime as needed insomnia  #30 x 0   Entered and Authorized by:   Kerby Nora MD   Signed by:   Kerby Nora MD on 04/30/2010   Method used:   Telephoned to ...       [facility]  [address] [facility] (retail)       [address]       [place], [place]  [address]       Ph: [phone]       Fax: [phone]   RxID:   [id number] CITALOPRAM  HYDROBROMIDE 40 MG TABS (CITALOPRAM HYDROBROMIDE) Take 1 tablet by mouth once a day  #90 x 3   Entered and Authorized by:   Kerby Nora MD   Signed by:   Kerby Nora MD on 04/30/2010   Method used:   Electronically to        [facility]  [address] (retail)       [address]       [place], [place]  [address]       Ph: [phone]       Fax: [phone]   RxID:   [id number]

## 2010-05-16 LAB — GLUCOSE, CAPILLARY: Glucose-Capillary: 149 mg/dL — ABNORMAL HIGH (ref 70–99)

## 2010-05-25 ENCOUNTER — Other Ambulatory Visit: Payer: Self-pay | Admitting: Family Medicine

## 2010-05-26 ENCOUNTER — Other Ambulatory Visit (INDEPENDENT_AMBULATORY_CARE_PROVIDER_SITE_OTHER): Payer: Medicare HMO | Admitting: Family Medicine

## 2010-05-26 DIAGNOSIS — E119 Type 2 diabetes mellitus without complications: Secondary | ICD-10-CM

## 2010-05-31 ENCOUNTER — Ambulatory Visit: Payer: Self-pay | Admitting: Family Medicine

## 2010-06-14 ENCOUNTER — Encounter: Payer: Self-pay | Admitting: Family Medicine

## 2010-06-14 ENCOUNTER — Ambulatory Visit (INDEPENDENT_AMBULATORY_CARE_PROVIDER_SITE_OTHER): Payer: Medicare HMO | Admitting: Family Medicine

## 2010-06-14 DIAGNOSIS — R140 Abdominal distension (gaseous): Secondary | ICD-10-CM | POA: Insufficient documentation

## 2010-06-14 DIAGNOSIS — E119 Type 2 diabetes mellitus without complications: Secondary | ICD-10-CM

## 2010-06-14 DIAGNOSIS — K589 Irritable bowel syndrome without diarrhea: Secondary | ICD-10-CM

## 2010-06-14 DIAGNOSIS — E785 Hyperlipidemia, unspecified: Secondary | ICD-10-CM

## 2010-06-14 DIAGNOSIS — R141 Gas pain: Secondary | ICD-10-CM

## 2010-06-14 DIAGNOSIS — K219 Gastro-esophageal reflux disease without esophagitis: Secondary | ICD-10-CM

## 2010-06-14 DIAGNOSIS — I10 Essential (primary) hypertension: Secondary | ICD-10-CM

## 2010-06-14 MED ORDER — METFORMIN HCL ER (OSM) 1000 MG PO TB24: ORAL_TABLET | ORAL | Status: DC

## 2010-06-14 MED ORDER — OMEPRAZOLE 20 MG PO CPDR: 40.0000 mg | DELAYED_RELEASE_CAPSULE | Freq: Every day | ORAL | Status: DC

## 2010-06-14 NOTE — Progress Notes (Signed)
  Subjective:    Patient ID: Marie Holmes, female    DOB: 1935/10/18, 75 y.o.   MRN: [id number]  HPI 75 year old female here for BP and Dm check.  Diabetes:  Inadequate control.Using medications without difficulties: but metformin not helping  Even at higher dose. Hypoglycemic episodes: none Hyperglycemic episodes: Feet problems:none Blood Sugars averaging: 185-215 fasting Only comes down if she fasts. eye exam within last year: yes Other issues: She has been sweating a lot during the day and night. Nocturia. Thirsty and dry skin.   Occ feels like food stopping in abdomen, not moving through... In last few months.  No  Abdominal pain.. More pressure, bloating. Copius gas.. Feels like it is radiating to back at times. Occ sour burps..food coming up at times. Nausea occ. Occ diarrhea... No relationship to changing metformin doses.  Hx of multiple abdominal surgeries.    Hypertension: Well controlled.    Using medication without problems or lightheadedness:  Chest pain with exertion: None Edema:None Short of breath: stable Average home BPs:120/70s  PMH and SH reviewed.   Vital signs, Meds and allergies reviewed.  ROS: See HPI.  Otherwise nontributory.   GEN: nad, alert and oriented HEENT: mucous membranes moist NECK: supple w/o LA CV: rrr.  no murmur PULM: ctab, no inc wob ABD: soft, +bs,mild pain in epigastrum momentarily, no hepatosplenomegaly, no rebound, no guarding EXT: no edema SKIN: no acute rash  Diabetic foot exam: Normal inspection No skin breakdown No calluses  Normal DP pulses Normal sensation to light tough and monofilament Nails normal    Review of Systems     Objective:   Physical Exam        Assessment & Plan:

## 2010-06-14 NOTE — Patient Instructions (Addendum)
Increase to 4 tabs daily of metformin until meds gone.. Then fill 1,000 mg tablets. Follow blood sugars at home.. If not improving in 1-2 weeks.. Call. Increase omeprazole to 2 tabs of 20 mg daily. Call if not improving.

## 2010-06-15 ENCOUNTER — Telehealth: Payer: Self-pay | Admitting: *Deleted

## 2010-06-15 NOTE — Assessment & Plan Note (Signed)
Worsened control despite increase in metformin.. If metformin is working for her.  Start by maxing out dose of metformin...may need to stop this med (possible SE of GI upset) or add glipizide, etc. She has tried Byetta in past and did not tolerate it.

## 2010-06-15 NOTE — Telephone Encounter (Signed)
Yes, okay to make the change... I thought I sent in generic.Marland Kitchen Please change in EMR as well.

## 2010-06-15 NOTE — Assessment & Plan Note (Signed)
See discussion in GERD section.

## 2010-06-15 NOTE — Telephone Encounter (Signed)
Marie Holmes is asking to change fortamet to metformin 1000 mg ER, number 60,  2 a day.  The fortamet is very expensive.  [facility] [place].

## 2010-06-15 NOTE — Assessment & Plan Note (Signed)
?   If this is sole cause of reflux of food into mouth and epigastric pain. She may also have elemant of DM gastroparesis, so we will try to get better control of DM. Also there may be an element of SE to metformin.  Will increase her PPI prilosec to 40 mg daily.  May need GI referral for further eval if not improving.

## 2010-06-15 NOTE — Assessment & Plan Note (Signed)
Improved control  Continue current regimen

## 2010-06-16 NOTE — Telephone Encounter (Signed)
Pharmacy advised as instructed

## 2010-06-23 ENCOUNTER — Encounter: Payer: Self-pay | Admitting: Family Medicine

## 2010-06-23 ENCOUNTER — Ambulatory Visit (INDEPENDENT_AMBULATORY_CARE_PROVIDER_SITE_OTHER): Payer: Medicare HMO | Admitting: Family Medicine

## 2010-06-23 VITALS — BP 120/62 | HR 76 | Temp 97.8°F | Ht 61.25 in | Wt 142.0 lb

## 2010-06-23 DIAGNOSIS — J40 Bronchitis, not specified as acute or chronic: Secondary | ICD-10-CM | POA: Insufficient documentation

## 2010-06-23 MED ORDER — GUAIFENESIN-CODEINE 100-10 MG/5ML PO SYRP: 5.0000 mL | ORAL_SOLUTION | Freq: Two times a day (BID) | ORAL | Status: DC | PRN

## 2010-06-23 MED ORDER — AZITHROMYCIN 250 MG PO TABS: ORAL_TABLET | ORAL | Status: AC

## 2010-06-23 NOTE — Progress Notes (Signed)
75 yo pt of Dr. Ermalene Searing here for ?bronchitis.  ?h/o COPD.  Does not appear to be on an inhaled steroid. Uses prn rescue inhaler.  5 days of worsening URI symptoms- harsh, wet, productive cough.    Has not taken her temperature but having chills and sweats at night.   L ear is hurting a bit   Can't sleep because cough keeping her awake.  The PMH, PSH, Social History, Family History, Medications, and allergies have been reviewed in [facility], and have been updated if relevant.  Review of Systems  General:  Complains of chills, fatigue, loss of appetite, and malaise; denies fever. Eyes:  Denies discharge and eye irritation. ENT:  Complains of hoarseness, nasal congestion, postnasal drainage, and sore throat; denies earache and sinus pressure. CV:  Denies chest pain or discomfort and palpitations. Resp:  Complains of cough, sputum productive, and wheezing; denies pleuritic. GI:  Denies diarrhea, nausea, and vomiting. Derm:  Denies itching and rash.  Physical Exam BP 120/62  Pulse 76  Temp(Src) 97.8 F (36.6 C) (Oral)  Ht 5' 1.25" (1.556 m)  Wt 142 lb (64.411 kg)  BMI 26.61 kg/m2  SpO2 96% General:  Well-developed,well-nourished,in no acute distress; alert,appropriate and cooperative throughout examination Head:  normocephalic, atraumatic, and no abnormalities observed.  mild maxillary sinus tenderness  Eyes:  vision grossly intact, pupils equal, pupils round, pupils reactive to light, and no injection.   Ears:  R ear normal and L ear normal.   Nose:  nares are boggy but clear  Mouth:  mildly hoarse voice  pharynx pink and moist, no erythema, and no exudates.   Neck:  No deformities, masses, or tenderness noted. Lungs:  harsh/ distant bs  hacking cough  CTA with harsh bs in bases/no rhonchi or rales  no wheeze- even on forced exp  Heart:  Normal rate and regular rhythm. S1 and S2 normal without gallop, murmur, click, rub or other extra sounds. Skin:  Intact without suspicious  lesions or rashes Cervical Nodes:  No lymphadenopathy noted Psych:  normal affect, talkative and pleasant

## 2010-06-23 NOTE — Assessment & Plan Note (Signed)
New. Given history and progression of symptoms, will treat for bacterial process with Zpack. Cheratussin for cough and supportive care as per pt instructions.

## 2010-06-23 NOTE — Patient Instructions (Signed)
Take antibiotic as directed.  Drink lots of fluids.  Treat sympotmatically with Mucinex, nasal saline irrigation, and Tylenol/Ibuprofen. AYou can use warm compresses.  Cough suppressant at night. Call if not improving as expected in 5-7 days.

## 2010-06-28 ENCOUNTER — Other Ambulatory Visit: Payer: Self-pay | Admitting: Family Medicine

## 2010-07-09 NOTE — Discharge Summary (Signed)
NAME:  Marie Holmes, Marie Holmes                         ACCOUNT NO.:  [id number]   MEDICAL RECORD NO.:  [id number]                   PATIENT TYPE:  INP   LOCATION:  5707                                 FACILITY:  [facility]   PHYSICIAN:  Kerby Nora, MD                     DATE OF BIRTH:  07-26-1935   DATE OF ADMISSION:  05/31/2003  DATE OF DISCHARGE:  06/03/2003                                 DISCHARGE SUMMARY   DISCHARGE DIAGNOSES:  1. Abdominal pain and distention secondary to irritable bowel syndrome.  2. Heme-positive stools secondary to colonic polyps.  3. Anxiety.  4. Elbow pain.   MEDICATIONS:  1. Levsinex 0.375 mg p.o. b.i.d. p.r.n. abdominal pain.  2. Phenergan 12.5 mg p.o. q.6h. p.r.n. nausea.  3. Ativan 1 mg p.o. q.h.s. p.r.n. anxiety and agitation.  4. Tylenol 325 mg p.o. q.4h. p.r.n. pain.   SPECIAL INSTRUCTIONS:  The patient is advised to take laxative upon home  arrival to get rid of barium.   FOLLOW UP:  The patient will follow up with Dr. Ermalene Searing at [facility] on Jul 01, 2003, at 2:15.   CONSULTATION:  GI, Dr. Juanda Chance.   PROCEDURES:  1. June 02, 2003, right upper quadrant abdominal ultrasound, no gallstones,     no abnormalities, pancreas not visualized.  2. June 01, 2003, KUB:  No evidence of obstruction or gross free abdominal     air.  3. May 31, 2003, CT of the abdomen and pelvis.  Impression:  Mild diffuse     fatty infiltration of liver, tiny hypodensity in right lobe of liver,     stable, small calcifications in spleen, pancreas and adrenal glands are     within normal limits, cyst in left kidney is stable, gallbladder is     within normal limits, no disproportionate dilation of bowel to suggest     obstruction, healing right-sided rib fractures noted.  4. June 02, 2003, colonoscopy.  Impression:  Multiple polyps in the sigmoid     colon to rectum, minimum size 3 mm, maximum size 5 mm.  No evidence of     diverticulosis, three polyps removed  and sent to pathology.  5. June 03, 2003, small-bowel follow-through:  Unremarkable.   HISTORY OF PRESENT ILLNESS:  The patient is a 75 year old white female with  abdominal pain and increased distension over the past one to two weeks.  The  patient had been seen previously in GI clinic with Dr. Juanda Chance and had a well-  known history of irritable bowel syndrome and right upper quadrant pain.  The patient was seen one week ago in [facility] Emergency  Department and was diagnosed with early diverticulitis and sent home with  Cipro and Flagyl.  Several days later, she saw her primary care physician,  Dr. Juanetta Gosling, in [place], [place], who continued that treatment  course.  Following that, she had worsening abdominal pain and distention  that brought her to the emergency room.   HOSPITAL COURSE:  PROBLEM #1 -  ABDOMINAL PAIN AND DISTENTION:  Initially on  admission, the patient had a KUB that showed no evidence of obstruction or  abdominal free air.  Given the history of a CT showing early diverticulitis,  a CT scan was repeated.  This second CT scan showed no evidence of  diverticulitis and no evidence of small-bowel obstruction; given her history  of multiple abdominal surgeries, it was thought that partial small-bowel  obstruction was still high on the list on her differential.  Given this, GI  was consulted.  They felt that partial small-bowel obstruction versus  irritable bowel syndrome were likely possibilities of etiology of her  abdominal pain and distention.  The patient then had a normal right upper  quadrant ultrasound and normal colonoscopy with only multiple small polyps.  Following this, she had small-bowel follow-through that only showed a small  dilated area.  the working diagnosis for her abdominal pain and distention  was irritable bowel syndrome.  She was begun on antispasmodics and her  abdominal pain improved greatly by the day of discharge.    PROBLEM #2 -  HEME-POSITIVE STOOLS:  Given this and her abdominal pain and  distention, she had the work-up noted in #1.  The heme-positive stools were  thought to be secondary to colonic polyps.  The patient's hemoglobin was  stable at 12.9.  The pathology is pending on these polyps removed and should  be followed up as an outpatient.   PROBLEM #3 -  ANXIETY:  The patient did have significant anxiety and  reported recent stressors included her mother dying approximately three  weeks ago.  The patient was started by Dr. Leveda Anna on 1 mg Ativan at bedtime  to assist with anxiety and sleep.  This may only need to be continued  temporarily until the patient can get a handle on her recent stressors in  her life.   PROBLEM #4 -  ELBOW PAIN:  The patient reported right elbow pain.  This pain  was consistent most with lateral epicondylitis.  The patient had normal  elbow films with no evidence of fracture.  This should be followed up on as  an outpatient and the patient given range of motion exercises.                                                Kerby Nora, MD    AB/MEDQ  D:  06/03/2003  T:  06/04/2003  Job:  [id number]   cc:   Lina Sar, M.D. [facility]

## 2010-07-09 NOTE — Assessment & Plan Note (Signed)
[facility]                             [place] OFFICE NOTE   NAME:Marie, Holmes                      MRN:          [id number]  DATE:10/18/2005                            DOB:          June 08, 1935    CHIEF COMPLAINT:  A 75 year old white female here to establish new doctor.   HISTORY OF PRESENT ILLNESS:  Marie Holmes is familiar to me as I took care  of her at [facility].  She would like to transfer here for  further care.  Today she would like to discuss the following.  1. Elevated blood pressure:  She has noted elevated blood pressures at      home ranging around 169/95.  She has felt somewhat symptomatic with a      slight headache.  She denies any recent chest pain but has had some      sharp intermittent chest pain in the past.  She has never had      cardiovascular evaluation such as a stress test.  She appears to be      taking metoprolol inappropriately 150 mg three tablets for a total of      450 mg b.i.d.  I doubt she is actually taking that much given that her      pulse is 70 but we did clarify today her dose.  She is also taking      enalapril 10 mg daily.  2. Anxiety:  She continues to have problems with general anxiety      throughout the day.  She feels as though she can never get her mind to      stop running.  She states that she continues to have problems sleeping      because she cannot stop thinking about things that worry her before she      goes to sleep.  She states she has always been this way and has been      her tendency but she would like to retry an antidepressant to see if it      improves this medication.   PAST MEDICAL HISTORY:  1. Hypertension.  2. COPD.  3. Anxiety.  4. Increased cholesterol.  5. Kidney stones.  6. Colon polyps.   HOSPITALIZATIONS/SURGERIES/PROCEDURES:  Patient is unsure and we will obtain  this from [facility].  1. Breast biopsy in 1971.  2.  Appendicitis in 1963.  3. Hysterectomy in 1971.   ALLERGIES:  SULFA.   MEDICATIONS:  1. Metoprolol 150 mg p.o. b.i.d. or possibly 450 mg p.o. b.i.d.  2. Enalapril 10 mg daily.  3. Albuterol p.r.n.   REVIEW OF SYSTEMS:  Otherwise negative.   FAMILY HISTORY:  Father deceased at age 51 with pulmonary embolism.  Mother  deceased at age 73 with hip fracture.  She has one brother and one half-  brother who are healthy and she has one sister who died in a motor vehicle  accident at a young age.  There is no family history of cancer of any type.   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  She is married happily, although frequently gets frustrated with her  husband.  She has no children.  She does not get regular exercise.  She  frequently eats fast food.   PHYSICAL EXAMINATION:  GENERAL APPEARANCE:  A thin appearing female in no  apparent distress.  VITAL SIGNS:  Height 5 feet 2 inches, weight 126, blood pressure 168/83,  pulse 70, temperature 97.7.  HEENT:  PERRLA.  Extraocular muscles intact.  Nares clear.  Oropharynx  clear.  Tympanic membranes clear.  NECK:  No thyromegaly, no lymphadenopathy.  PULMONARY:  Clear to auscultation bilaterally.  No wheezing, rhonchi or  rales.  CARDIOVASCULAR:  Regular rate and rhythm, no murmurs, rubs, or gallops.  ABDOMEN:  Soft, nontender, normal active bowel sounds, no  hepatosplenomegaly.  EXTREMITIES:  There are 2+ pulses, no peripheral edema.   ASSESSMENT/PLAN:  1. Hypertension, poor control.  We will gradually titrate up her      enalapril.  She would go up to 20 mg daily.  We clarified her      metoprolol dose for it to be 150 mg twice daily.  She likely needs a      cardiovascular evaluation given her occasional chest pain.  She has      never had a Cardiolite or a stress test.  As soon as we obtain records      from [facility], we can consider setting this up.  2. Anxiety,  chronic:  We will restart her on fluoxetine today 20 mg daily.      Hopefully, this will help with her anxiety symptoms.  3. Chronic obstructive pulmonary disease:  She would benefit from      pulmonary function tests to determine whether she has a reversible      component.  We should likely initiate Advair given the fact that she      has frequent cough and some shortness of breath.  She has been using      the albuterol occasionally.  She will return to clinic following      receipt of the records.                                   Kerby Nora, MD   AB/MedQ  DD:  10/18/2005  DT:  10/19/2005  Job #:  [id number]

## 2010-07-09 NOTE — H&P (Signed)
NAME:  Marie Holmes, Marie Holmes                         ACCOUNT NO.:  [id number]   MEDICAL RECORD NO.:  [id number]                   PATIENT TYPE:  INP   LOCATION:  5707                                 FACILITY:  [facility]   PHYSICIAN:  Santiago Bumpers. Hensel, M.D.             DATE OF BIRTH:  1935-06-27   DATE OF ADMISSION:  05/31/2003  DATE OF DISCHARGE:                                HISTORY & PHYSICAL   CHIEF COMPLAINT:  Abdominal pain, distention.   PRIMARY MEDICAL PHYSICIAN:  Oneal Deputy. Juanetta Gosling, M.D.   HISTORY OF PRESENT ILLNESS:  This is a 75 year old white female with  abdominal pain and increasing distention over the past one to two weeks.  Ms. Castrellon was seen approximately one week ago at [facility] Emergency  Department and diagnosed with early diverticulitis.  She was sent home on  seven days of Cipro and Flagyl.  She was also sent home with Vicodin for  pain relief and Phenergan for nausea.  She saw her primary medical doctor  approximately two days later and told to continue the treatment course.  Over the past few days, she has experienced intermittent stabbing and right  lower quadrant abdominal pain.  This is worse when lying supine and improved  with ambulation.  She denies any fevers, chills.  She has been nauseous but  denies any vomiting.  She  has only had two bowel movements in the past  seven days.  There has been no diarrhea, except a few episodes after her CT  scan with contrast last week.   PAST MEDICAL HISTORY:  None, per patient report.   PAST SURGICAL HISTORY:  1. Total abdominal hysterectomy and status post a left oophorectomy.  2. Status post appendectomy.  3. Status post exploratory laparotomy for adhesions in the 1970s.  4. Benign tumor excision on her left arm.   MEDICATIONS:  1. Cipro.  2. Flagyl.  3. Vicodin.  4. Phenergan.   All of these were prescribed last week and she has been taking.   ALLERGIES:  SULFA drugs, which cause ulcers on her mouth and a  rash.   SOCIAL HISTORY:  She currently lives in [place] with her husband.  She is  retired as an Environmental health practitioner.  She has had CMA training in the  past.  She has smoked approximately one pack per day since 75 years of age.  She denies any alcohol.  She has four children.  She has been married to her  current husband for about four years, as her previous husband is deceased.   FAMILY HISTORY:  Mother died at the age of 40, status post a hip fracture.  She also had hypothyroidism and osteoporosis.  Her father died at the age of  58, secondary to a pulmonary embolism.  She has one brother and one half-  brother who are both healthy.  She does have a daughter and a niece with  systemic lupus erythematosus.   PHYSICAL EXAMINATION:  Her temp is 98.3, blood pressure 162/80, heart rate  is 114 (86 by recheck performed by myself), respiratory rate 20, O2 sat 97%  on room air.  In general, she is an anxious appearing white female in no  acute distress.  HEENT:  Normocephalic, atraumatic.  PERRLA, EOMI, tympanic membranes clear,  oropharynx shows a dry tongue, but no erythema.  Neck is supple.  No  lymphadenopathy or thyromegaly.  CARDIOVASCULAR:  Regular rate and rhythm, no murmurs.  Her rate was 86, by  my check.  CHEST:  Clear to auscultation.  No rattle or wheeze.  ABDOMEN:  Soft, but distended.  There are hyperactive bowel sounds.  She has  mild to moderate tenderness to palpation of the right lower quadrant,  without any rebound or guarding.  EXTREMITIES:  Without edema.  NEUROLOGIC EXAM:  Alert and oriented x 3.  Cranial nerves II-XII are intact.  Strength and sensation are normal.   LABORATORY DATA:  White blood count 10.7, hemoglobin 14.0, hematocrit 41.0,  platelets 362, sodium 142, potassium 3.6, chloride 109, bicarb 25, BUN 11,  creatinine 0.8, glucose 109, total bilirubin 0.9, alk phos 77, AST 50, ALT  43, total protein 6.6, albumin 4.0, calcium 9.2, PT 12.7, INR 0.9,  PTT 35,  lipase 18.  Urinalysis is negative, except for 15 ketones.   OTHER STUDIES:  1. Acute abdominal series reveals old rib fractures.  There are several air     fluid levels in descending colon; question diarrheal disease.  There is     normal bowel gas pattern and no dilated bowel.  2. CT of the abdomen and pelvis:  No acute disease.  There are some liver     and renal hyperdensities and this is unchanged from a previous CT scan.   ASSESSMENT:  1. Abdominal distention, confusing picture.  She is status post treatment     for diverticulitis.  There is no previous increase white blood count or     fever.  She has new question of mild obstruction from adhesions from     previous surgeries, but no evidence of such on CT.  We will admit, place     on bowel rest, avoid opiates, give IV fluids and follow.  We will     consider NG tube placement if distension continues.  We will hold     antibiotics for now.  2. Anxiety.  The patient __________anxiety while in room.  Recent stressors     include mother dying approximately three weeks.  Could this be IBS vs     stress?  We will keep this in mind. We will consider antispasmodic.  3. Increased LFT is very mild.  We will recheck in the a.m.  4. Increased blood pressure.  The patient is anxious and ___________.  We     will follow while in hospital.   DICTATING FOR:  Santiago Bumpers. Leveda Anna, M.D.      Rodolph Bong, M.D.                        William A. Leveda Anna, M.D.    AK/MEDQ  D:  05/31/2003  T:  06/01/2003  Job:  [id number]

## 2010-07-21 ENCOUNTER — Other Ambulatory Visit: Payer: Self-pay | Admitting: *Deleted

## 2010-07-22 MED ORDER — METFORMIN HCL ER 500 MG PO TB24: ORAL_TABLET | ORAL | Status: DC

## 2010-08-04 MED ORDER — MIDAZOLAM 1 MG/ML IJ SOLN
1 mg/mL | INTRAMUSCULAR | Status: DC | PRN
Start: 2010-08-04 — End: 2010-08-04
  Administered 2010-08-04 (×4): via INTRAVENOUS

## 2010-08-04 MED ORDER — DIPHENHYDRAMINE HCL 50 MG/ML IJ SOLN
50 mg/mL | Freq: Once | INTRAMUSCULAR | Status: AC
Start: 2010-08-04 — End: 2010-08-04
  Administered 2010-08-04: 15:00:00 via INTRAVENOUS

## 2010-08-04 MED ORDER — SIMETHICONE 40 MG/0.6 ML ORAL DROPS, SUSP
40 mg/0.6 mL | ORAL | Status: DC | PRN
Start: 2010-08-04 — End: 2010-08-04

## 2010-08-04 MED ORDER — FLUMAZENIL 0.1 MG/ML IV SOLN
0.1 mg/mL | INTRAVENOUS | Status: DC | PRN
Start: 2010-08-04 — End: 2010-08-04

## 2010-08-04 MED ORDER — FENTANYL CITRATE (PF) 50 MCG/ML IJ SOLN
50 mcg/mL | INTRAMUSCULAR | Status: DC | PRN
Start: 2010-08-04 — End: 2010-08-04
  Administered 2010-08-04 (×3): via INTRAVENOUS

## 2010-08-04 MED ORDER — NALOXONE 0.4 MG/ML INJECTION
0.4 mg/mL | INTRAMUSCULAR | Status: DC | PRN
Start: 2010-08-04 — End: 2010-08-04

## 2010-08-04 MED ADMIN — 0.9% sodium chloride infusion: INTRAVENOUS | @ 15:00:00 | NDC 00409798303

## 2010-08-04 NOTE — Procedures (Signed)
Procedures by Leta Speller, MD at 08/04/10 1109                Author: Leta Speller, MD  Service: --  Author Type: Physician       Filed: 08/04/10 1113  Date of Service: 08/04/10 1109  Status: Signed          Editor: Leta Speller, MD (Physician)            Pre-procedure Diagnoses        1. Anemia [285.9]                           Post-procedure Diagnoses        1. Internal hemorrhoids [455.0]        2. Diverticulosis [562.10]                           Procedures        1. COLONOSCOPY [HYQ6578 (Custom)]                                                         [facility]   [place] [facility] D. Nunzio Cory, MD   [phone]        August 04, 2010      Colonoscopy Procedure Note   Amy Galloway   DOB:  09/21/1935   [facility] Medical Record Number: [id number]      Indications:     Iron deficiency anemia - 280.9   PCP:  Larena Glassman, MD   Anesthesia/Sedation: Conscious Sedation/Moderate Sedation   Endoscopist:  Dr. Theodoro Parma   Complications:  None   Estimated Blood Loss:  None      Permit:   The indications, risks, benefits and alternatives were reviewed with the patient or their decision maker who was provided an opportunity to ask questions  and all questions were answered.  The specific risks of colonoscopy with conscious sedation were reviewed, including but not limited to anesthetic complication, bleeding, adverse drug reaction, missed lesion, infection, IV site reactions, and intestinal  perforation which would lead to the need for surgical repair.  Alternatives to colonoscopy including radiographic imaging, observation without testing, or laboratory testing were reviewed including the limitations of those alternatives.  After considering  the options and having all their questions answered, the patient or their decision maker provided both verbal and written consent to proceed.           Procedure in Detail:   After  obtaining informed consent, positioning of the patient in the left lateral decubitus position, and conduction of a pre-procedure pause or "time out" the endoscope was introduced into the anus and advanced to the cecum, which was identified by the  ileocecal valve and appendiceal orifice.  The quality of the colonic preparation was good.  A careful inspection was made as the colonoscope was withdrawn, findings and interventions are described below.      Findings:    There is diverticulosis in the sigmoid colon without complications such as bleeding, inflammatory change, or luminal narrowing.   In the rectum, medium internal hemorrhoids are noted without bleeding.      Specimens:  none      Complications:    None; patient tolerated the procedure well.      Impression:   Normal colonoscopy to the cecum, with no evidence of neoplasia, clinically diverticular disease, or mucosal abnormality.      Recommendations:      - High fiber diet.     - Given the normal findings on EGD and colonoscopy and the absence of overt GI blood loss, [name] anemia is a consequence of her gastric bypass surgery and the only treatment for that is iron supplementation PO (IV if needed.)     - Follow up with your primary care physician as needed.      Thank you for entrusting me with this patient's care.  Please do not hesitate to contact me with any questions or if I can be of assistance with any of your other patients' GI needs.      Signed By: Leta Speller, MD                         August 04, 2010

## 2010-08-04 NOTE — Procedures (Signed)
Procedures by Leta Speller, MD at 08/04/10 1107                Author: Leta Speller, MD  Service: --  Author Type: Physician       Filed: 08/04/10 1109  Date of Service: 08/04/10 1107  Status: Signed          Editor: Leta Speller, MD (Physician)            Pre-procedure Diagnoses        1. Anemia [285.9]                           Procedures        1. EGD [GI2 (Custom)]                                                           [facility]   [place] - [facility] D. Nunzio Cory, MD   [phone]        August 04, 2010      Esophagogastroduodenoscopy (EGD) Procedure Note   Amy Galloway   DOB: 07/02/1935   [facility] Medical Record Number: [id number]         Indications:    Iron deficiency anemia - 280.9   Referring Physician:  Larena Glassman, MD   Anesthesia/Sedation: Conscious Sedation/Moderate Sedation   Endoscopist:  Dr. Theodoro Parma   Complications:  None   Estimated Blood Loss:  None      Permit:   The indications, risks, benefits and alternatives were reviewed with the patient or their decision maker who was provided an opportunity to ask questions  and all questions were answered.  The specific risks of esophagogastroduodenoscopy with conscious sedation were reviewed, including but not limited to anesthetic complication, bleeding, adverse drug reaction, missed lesion, infection, IV site reactions,  and intestinal perforation which would lead to the need for surgical repair.  Alternatives to EGD including radiographic imaging, observation without testing, or laboratory testing were reviewed as well as the limitations of those alternatives discussed.   After considering the options and having all their questions answered, the patient or their decision maker provided both verbal and written consent to proceed.         Procedure in Detail:   After obtaining informed consent, positioning of the patient in the left lateral decubitus  position, and conduction of a pre-procedure pause or "time out" the endoscope was introduced into the mouth and advanced to the duodenum.  A careful inspection  was made, and findings or interventions are described below.      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   Duodenum/jejunum: normal      Therapies:  none      Specimens: none      Impression: Gastric bypass.             Recommendations:   -See colonoscopy report.      Thank you for entrusting me with this patient's care.  Please do not hesitate to contact me with any questions or  if I can be of assistance with any of your other patients' GI needs.   Signed By: Leta Speller, MD                         August 04, 2010

## 2010-08-04 NOTE — Procedures (Signed)
[facility]  [place] - [facility] D. Nunzio Cory, MD  [phone]      August 04, 2010    Colonoscopy Procedure Note  Amy Galloway  DOB:  Nov 24, 1935  [facility] Medical Record Number: [id number]    Indications:     Iron deficiency anemia - 280.9  PCP:  Larena Glassman, MD  Anesthesia/Sedation: Conscious Sedation/Moderate Sedation  Endoscopist:  Dr. Theodoro Parma  Complications:  None  Estimated Blood Loss:  None    Permit:  The indications, risks, benefits and alternatives were reviewed with the patient or their decision maker who was provided an opportunity to ask questions and all questions were answered.  The specific risks of colonoscopy with conscious sedation were reviewed, including but not limited to anesthetic complication, bleeding, adverse drug reaction, missed lesion, infection, IV site reactions, and intestinal perforation which would lead to the need for surgical repair.  Alternatives to colonoscopy including radiographic imaging, observation without testing, or laboratory testing were reviewed including the limitations of those alternatives.  After considering the options and having all their questions answered, the patient or their decision maker provided both verbal and written consent to proceed.        Procedure in Detail:  After obtaining informed consent, positioning of the patient in the left lateral decubitus position, and conduction of a pre-procedure pause or "time out" the endoscope was introduced into the anus and advanced to the cecum, which was identified by the ileocecal valve and appendiceal orifice.  The quality of the colonic preparation was good.  A careful inspection was made as the colonoscope was withdrawn, findings and interventions are described below.    Findings:   There is diverticulosis in the sigmoid colon without complications such as bleeding, inflammatory change, or luminal narrowing.   In the rectum, medium internal hemorrhoids are noted without bleeding.    Specimens:    none    Complications:   None; patient tolerated the procedure well.    Impression:  Normal colonoscopy to the cecum, with no evidence of neoplasia, clinically diverticular disease, or mucosal abnormality.    Recommendations:     - High fiber diet.    - Given the normal findings on EGD and colonoscopy and the absence of overt GI blood loss, [name]'s anemia is a consequence of her gastric bypass surgery and the only treatment for that is iron supplementation PO (IV if needed.)    - Follow up with your primary care physician as needed.    Thank you for entrusting me with this patient's care.  Please do not hesitate to contact me with any questions or if I can be of assistance with any of your other patients' GI needs.    Signed By: Leta Speller, MD                        August 04, 2010

## 2010-08-04 NOTE — Procedures (Signed)
 [facility]  [place] - [facility] [facility] D. Nunzio Cory, MD  [phone]      August 04, 2010    Esophagogastroduodenoscopy (EGD) Procedure Note  Amy Galloway  DOB: December 10, 1935  [facility] Medical Record Number: [id number]      Indications:    Iron deficiency anemia - 280.9  Referring Physician:  Larena Glassman, MD  Anesthesia/Sedation: Conscious Sedation/Moderate Sedation  Endoscopist:  Dr. Theodoro Parma  Complications:  None  Estimated Blood Loss:  None    Permit:  The indications, risks, benefits and alternatives were reviewed with the patient or their decision maker who was provided an opportunity to ask questions and all questions were answered.  The specific risks of esophagogastroduodenoscopy with conscious sedation were reviewed, including but not limited to anesthetic complication, bleeding, adverse drug reaction, missed lesion, infection, IV site reactions, and intestinal perforation which would lead to the need for surgical repair.  Alternatives to EGD including radiographic imaging, observation without testing, or laboratory testing were reviewed as well as the limitations of those alternatives discussed.  After considering the options and having all their questions answered, the patient or their decision maker provided both verbal and written consent to proceed.       Procedure in Detail:  After obtaining informed consent, positioning of the patient in the left lateral decubitus position, and conduction of a pre-procedure pause or "time out" the endoscope was introduced into the mouth and advanced to the duodenum.  A careful inspection was made, and findings or interventions are described below.    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   Duodenum/jejunum: normal    Therapies:  none    Specimens: none    Impression: Gastric bypass.           Recommendations:  -See colonoscopy report.    Thank you for entrusting me with this patient's care.  Please do not hesitate to contact me with any questions or if I can be of assistance with any of your other patients' GI needs.  Signed By: Leta Speller, MD                        August 04, 2010

## 2010-08-04 NOTE — Interval H&P Note (Signed)
Pre-Endoscopy H&P Update  Chief complaint/HPI:  The indication for the procedure, the patient's history and the patient's current medications are reviewed prior to the procedure and that data is reported on the H&P to which this document is attached.  Past Medical History   Diagnosis Date   ??? Thyroid disease    ??? Psychiatric disorder      depression   ??? Arrhythmia      PSVT   ??? Hypertension    ??? Other ill-defined conditions      TMJ   ??? Other ill-defined conditions      vitamin D deficiency   ??? Other ill-defined conditions      insomnia   ??? Other ill-defined conditions      carpel tunnel syndrome   ??? Other ill-defined conditions      iron deficiency   ??? Cancer 1979     breast cancer/ chemotherapy (1980-81)      Past Surgical History   Procedure Date   ??? Hx dilation and curettage 1975     excessive bleeding   ??? Hx hysterectomy 1976     fibroid tumor causing excessive bleeding   ??? Hx gi 1978     hemorrhoidectomy and repair of anal ulcer   ??? Hx orthopaedic 1995     right carpel tunnel surgery   ??? Hx orthopaedic 2005     arthroscopic right shoulder surgery - torn rotator cuff   ??? Hx orthopaedic 2010, 2011,2012     spinal steroid injection L4, L5- spinal stenosis   ??? Cardiac surg procedure unlist 2007     heart cath due to rapid heart rate   ??? Hx gastric bypass 2000, 2001     initial/repair   ??? Hx heent      macular degeneration   ??? Hx heent 2006     laser surgery to repair left torn retina    ??? Breast surgery procedure unlisted 1979     left modified radical mastectomy - breast cancer   ??? Breast surgery procedure unlisted 1982     reconstruction     Social   History   Substance Use Topics   ??? Smoking status: Not on file   ??? Smokeless tobacco: Not on file   ??? Alcohol Use:       No family history on file.   No Known Allergies   Prior to Admission Medications   Medication Last Dose Informant Patient Reported? Taking?   levothyroxine (LEVOXYL) 150 mcg tablet 08/04/2010 at Unknown  Yes Yes    Take 150 mcg by mouth Daily (before breakfast).     omeprazole (PRILOSEC) 20 mg capsule 08/03/2010 at Unknown  Yes Yes   Take 20 mg by mouth every morning.     TRIAMTERENE-HYDROCHLOROTHIAZID PO 08/03/2010 at Unknown  Yes Yes   Take 0.5 Tabs by mouth every morning.     propranolol (INDERAL) 20 mg tablet 08/04/2010 at Unknown  Yes Yes   Take 20 mg by mouth three (3) times daily.     citalopram (CELEXA) 20 mg tablet 08/04/2010 at Unknown  Yes Yes   Take 20 mg by mouth daily.     buPROPion SR (WELLBUTRIN SR) 150 mg SR tablet 08/04/2010 at Unknown  Yes Yes   Take 150 mg by mouth two (2) times a day.     ergocalciferol (VITAMIN D2) 50,000 unit capsule 08/03/2010 at Unknown  Yes Yes   Take 50,000 Units by mouth. Twice a month  fluticasone (FLONASE) 50 mcg/actuation nasal spray 08/03/2010 at Unknown  Yes Yes   2 Sprays by Both Nostrils route daily.     Azelastine (ASTEPRO) 0.15 % (205.5 mcg) nasal spray 08/03/2010 at Unknown  Yes Yes   2 Sprays by Both Nostrils route daily.     traZODone (DESYREL) 50 mg tablet 08/03/2010 at Unknown  Yes Yes   1 - 1 1/2 tablets at bedtime    DOCUSATE SODIUM (COLACE PO) 08/03/2010 at Unknown  Yes Yes   Take 250 mg by mouth two (2) times a day.     PYRIDOXINE HCL (VITAMIN B-6 PO) 08/03/2010 at Unknown  Yes Yes   Take 500 mg by mouth daily.     cyanocobalamin (VITAMIN B-12) 1,000 mcg tablet 08/03/2010 at Unknown  Yes Yes   Take 1,000 mcg by mouth daily.     aspirin 81 mg tablet 07/04/2010 at Unknown  Yes Yes   Take 81 mg by mouth daily.     CALCIUM CITRATE-VITAMIN D3 PO 08/03/2010 at Unknown  Yes Yes   Take 2 Caps by mouth daily.     iron polysaccharides (NU-IRON) 150 mg capsule 07/28/2010 at Unknown  Yes Yes   Take 150 mg by mouth daily.     Cholecalciferol, Vitamin D3, (VITAMIN D3) 1,000 unit Cap 08/03/2010 at Unknown  Yes Yes   Take 1 Tab by mouth daily.     VIT A/VIT C/VIT E/ZINC/COPPER (I-CAPS AREDS PO) 08/03/2010 at Unknown  Yes Yes   Take 1 Tab by mouth daily.      ranitidine (ZANTAC) 150 mg tablet 08/03/2010 at Unknown  Yes Yes   Take 150 mg by mouth as needed.     LORazepam (ATIVAN) 0.5 mg tablet Unknown at Unknown  Yes No   Take 0.5 mg by mouth two (2) times a day.     benzonatate (TESSALON) 100 mg capsule Unknown at Unknown  Yes No   Take 100 mg by mouth as needed.     naproxen (NAPROSYN) 500 mg tablet Unknown at Unknown  Yes No   Take 500 mg by mouth as needed.     acetaminophen 650 mg Tab Unknown at Unknown  Yes No   Take 1 Tab by mouth as needed.            PHYSICAL EXAM:  The patient is examined immediately prior to the procedure.  Visit Vitals   Item Reading   ??? BP 154/80   ??? Pulse 75   ??? Resp 14   ??? Ht 5\' 4"  (1.626 m)   ??? Wt 95.255 kg (210 lb)   ??? BMI 36.05 kg/m2   ??? SpO2 99%     Gen: Appears comfortable, no distress.  Pulm: comfortable respirations with no abnormal audible breath sounds  CV: heart regular, well perfused  GI: abdomen flat.    PLAN:  Informed consent discussion held, patient afforded an opportunity to ask questions and all questions answered.  After being advised of the risks, benefits, and alternatives, the patient requested that we proceed and indicated so on a written consent form.      Will proceed with procedure as planned.  Leta Speller, MD

## 2010-08-04 NOTE — H&P (Signed)
75 y.o. female for diagnostic EGD and colonoscopy for iron deficiency anemia on 08/04/2010.  Additional data for completion of the targeted pre-endoscopy H&P will be provided under 'H&P interval notes'.  Please see that document which will be attached to this.  Amy Speller, MD

## 2010-08-16 LAB — HM DIABETES EYE EXAM

## 2010-09-09 ENCOUNTER — Other Ambulatory Visit (INDEPENDENT_AMBULATORY_CARE_PROVIDER_SITE_OTHER): Payer: Medicare HMO

## 2010-09-09 DIAGNOSIS — E119 Type 2 diabetes mellitus without complications: Secondary | ICD-10-CM

## 2010-09-09 DIAGNOSIS — E785 Hyperlipidemia, unspecified: Secondary | ICD-10-CM

## 2010-09-09 LAB — LIPID PANEL
Cholesterol: 188 mg/dL (ref 0–200)
LDL Cholesterol: 104 mg/dL — ABNORMAL HIGH (ref 0–99)
VLDL: 20 mg/dL (ref 0.0–40.0)

## 2010-09-09 LAB — COMPREHENSIVE METABOLIC PANEL
ALT: 13 U/L (ref 0–35)
Albumin: 4.1 g/dL (ref 3.5–5.2)
Alkaline Phosphatase: 69 U/L (ref 39–117)
CO2: 30 mEq/L (ref 19–32)
Glucose, Bld: 148 mg/dL — ABNORMAL HIGH (ref 70–99)
Potassium: 4.4 mEq/L (ref 3.5–5.1)
Sodium: 142 mEq/L (ref 135–145)
Total Protein: 6.8 g/dL (ref 6.0–8.3)

## 2010-09-09 LAB — HEMOGLOBIN A1C: Hgb A1c MFr Bld: 7.2 % — ABNORMAL HIGH (ref 4.6–6.5)

## 2010-09-15 ENCOUNTER — Encounter: Payer: Medicare HMO | Admitting: Family Medicine

## 2010-09-16 ENCOUNTER — Encounter: Payer: Self-pay | Admitting: Family Medicine

## 2010-09-16 ENCOUNTER — Ambulatory Visit (INDEPENDENT_AMBULATORY_CARE_PROVIDER_SITE_OTHER): Payer: Medicare HMO | Admitting: Family Medicine

## 2010-09-16 DIAGNOSIS — E119 Type 2 diabetes mellitus without complications: Secondary | ICD-10-CM

## 2010-09-16 DIAGNOSIS — J449 Chronic obstructive pulmonary disease, unspecified: Secondary | ICD-10-CM

## 2010-09-16 DIAGNOSIS — M899 Disorder of bone, unspecified: Secondary | ICD-10-CM

## 2010-09-16 DIAGNOSIS — J4489 Other specified chronic obstructive pulmonary disease: Secondary | ICD-10-CM

## 2010-09-16 DIAGNOSIS — N3941 Urge incontinence: Secondary | ICD-10-CM

## 2010-09-16 DIAGNOSIS — E785 Hyperlipidemia, unspecified: Secondary | ICD-10-CM

## 2010-09-16 DIAGNOSIS — F411 Generalized anxiety disorder: Secondary | ICD-10-CM

## 2010-09-16 DIAGNOSIS — K219 Gastro-esophageal reflux disease without esophagitis: Secondary | ICD-10-CM

## 2010-09-16 DIAGNOSIS — R350 Frequency of micturition: Secondary | ICD-10-CM

## 2010-09-16 DIAGNOSIS — Z0000 Encounter for general adult medical examination without abnormal findings: Secondary | ICD-10-CM

## 2010-09-16 DIAGNOSIS — M949 Disorder of cartilage, unspecified: Secondary | ICD-10-CM

## 2010-09-16 DIAGNOSIS — I10 Essential (primary) hypertension: Secondary | ICD-10-CM

## 2010-09-16 LAB — POCT URINALYSIS DIPSTICK
Glucose, UA: NEGATIVE
Leukocytes, UA: NEGATIVE
Nitrite, UA: NEGATIVE
Protein, UA: NEGATIVE
Spec Grav, UA: 1.015
Urobilinogen, UA: NEGATIVE

## 2010-09-16 LAB — HM DIABETES FOOT EXAM

## 2010-09-16 MED ORDER — ALPRAZOLAM 0.5 MG PO TABS: 0.5000 mg | ORAL_TABLET | Freq: Every evening | ORAL | Status: DC | PRN

## 2010-09-16 NOTE — Assessment & Plan Note (Signed)
Continue metformin, work on lifestyle changes. We will recheck in 3 months.

## 2010-09-16 NOTE — Patient Instructions (Addendum)
Work on decreasing ice cream. Start exercise program, try to do daily walking. Look into insurance coverage of shingle vaccine. Stop at front to schuedule Bone density. Make sure to do yearly eye exam.

## 2010-09-16 NOTE — Assessment & Plan Note (Signed)
Well-controlled on current meds 

## 2010-09-16 NOTE — Assessment & Plan Note (Signed)
Stable control on current meds. 

## 2010-09-16 NOTE — Progress Notes (Signed)
Subjective:    Patient ID: Marie Holmes, female    DOB: 05/24/1935, 75 y.o.   MRN: [id number]  HPI  I have personally reviewed the Medicare Annual Wellness questionnaire and have noted 1. The patient's medical and social history 2. Their use of alcohol, tobacco or illicit drugs 3. Their current medications and supplements 4. The patient's functional ability including ADL's, fall risks, home safety risks and hearing or visual             impairment. 5. Diet and physical activities 6. Evidence for depression or mood disorders The patients weight, height, BMI and visual acuity have been recorded in the chart I have made referrals, counseling and provided education to the patient based review of the above and I have provided the pt with a written personalized care plan for preventive services.  Diabetes: Inadequate control but better now on max metformin.  Last A1C 7.2 down from 7.5 Using medications without difficulties: but metformin not helping Even at higher dose.  Hypoglycemic episodes: none  Hyperglycemic episodes:  Feet problems:none  Blood Sugars averaging:  In last 2-3 mornings 130-135 fasting, 2 hours after meal 130 Still eating a lot of ice cream in AMs. Eye exam within last year: NO  On higher dose of omeprazole 40 mg daily... GI issue: Helped moderately along with tagmet with issues with: feels like food stopping in abdomen, not moving through... In last few months.   Hypertension: Well controlled.  Using medication without problems or lightheadedness:  Chest pain with exertion: None  Edema:None  Short of breath: stable  Average home BPs:120/70s  PMH and SH reviewed.   Anxiety: On celexa, using alprazolam for sleep.   Review of Systems  Constitutional: Negative for fever, fatigue and unexpected weight change.  HENT: Negative for ear pain, congestion, sore throat, sneezing, trouble swallowing and sinus pressure.   Eyes: Negative for pain and itching.  Respiratory:  Negative for cough, shortness of breath and wheezing.   Cardiovascular: Negative for chest pain, palpitations and leg swelling.  Gastrointestinal: Positive for abdominal distention. Negative for nausea, abdominal pain, diarrhea, constipation and blood in stool.  Genitourinary: Positive for frequency. Negative for dysuria, hematuria, vaginal bleeding, vaginal discharge, difficulty urinating and menstrual problem.       NOted in last month, no other symptoms, does not empty completely.  Skin: Negative for rash.  Neurological: Negative for syncope, weakness, light-headedness, numbness and headaches.  Psychiatric/Behavioral: Negative for confusion and dysphoric mood. The patient is not nervous/anxious.        Objective:   Physical Exam  Constitutional: Vital signs are normal. She appears well-developed and well-nourished. She is cooperative.  Non-toxic appearance. She does not appear ill. No distress.  HENT:  Head: Normocephalic.  Right Ear: Hearing, tympanic membrane, external ear and ear canal normal.  Left Ear: Hearing, tympanic membrane, external ear and ear canal normal.  Nose: Nose normal.  Eyes: Conjunctivae, EOM and lids are normal. Pupils are equal, round, and reactive to light. No foreign bodies found.  Neck: Trachea normal and normal range of motion. Neck supple. Carotid bruit is not present. No mass and no thyromegaly present.  Cardiovascular: Normal rate, regular rhythm, S1 normal, S2 normal, normal heart sounds and intact distal pulses.  Exam reveals no gallop.   No murmur heard. Pulmonary/Chest: Effort normal and breath sounds normal. No respiratory distress. She has no wheezes. She has no rhonchi. She has no rales.  Abdominal: Soft. Normal appearance and bowel sounds are normal. She  exhibits no distension, no fluid wave, no abdominal bruit and no mass. There is no hepatosplenomegaly. There is no tenderness. There is no rebound, no guarding and no CVA tenderness. No hernia.    Genitourinary: No breast swelling, tenderness, discharge or bleeding. Pelvic exam was performed with patient prone.  Lymphadenopathy:    She has no cervical adenopathy.    She has no axillary adenopathy.  Neurological: She is alert. She has normal strength. No cranial nerve deficit or sensory deficit.  Skin: Skin is warm, dry and intact. No rash noted.  Psychiatric: Her speech is normal and behavior is normal. Judgment normal. Her mood appears not anxious. Cognition and memory are normal. She does not exhibit a depressed mood.          Assessment & Plan:  Annual Medicare: The patient's preventative maintenance and recommended screening tests for an annual wellness exam were reviewed in full today. Brought up to date unless services declined.  Counselled on the importance of diet, exercise, and its role in overall health and mortality. The patient's FH and SH was reviewed, including their home life, tobacco status, and drug and alcohol status.   Refuses mammogram this year, will do every 2 years.  NO DVE or pap.. Only has right ovary, no symptoms. Colonoscopy 2011 Due for DXA. Will consider shingles vaccine.

## 2010-09-16 NOTE — Assessment & Plan Note (Signed)
Well controlled. Continue current medication.  

## 2010-09-16 NOTE — Assessment & Plan Note (Signed)
Improved on higher dose of omeprazole. A lot of GI symptoms may be due to metformin but she is tolerating.

## 2010-09-16 NOTE — Assessment & Plan Note (Signed)
Great improvement. LDL almost at goal <100.

## 2010-09-16 NOTE — Assessment & Plan Note (Signed)
No sign of infection. Not bothering her enough to start medication. Will follow.

## 2010-10-05 ENCOUNTER — Ambulatory Visit (INDEPENDENT_AMBULATORY_CARE_PROVIDER_SITE_OTHER)
Admission: RE | Admit: 2010-10-05 | Discharge: 2010-10-05 | Disposition: A | Discharge status: Home / Self Care | Payer: Medicare HMO | Source: Ambulatory Visit

## 2010-10-05 DIAGNOSIS — M899 Disorder of bone, unspecified: Secondary | ICD-10-CM

## 2010-10-05 DIAGNOSIS — M949 Disorder of cartilage, unspecified: Secondary | ICD-10-CM

## 2010-10-15 ENCOUNTER — Ambulatory Visit (INDEPENDENT_AMBULATORY_CARE_PROVIDER_SITE_OTHER): Payer: Medicare HMO | Admitting: Family Medicine

## 2010-10-15 ENCOUNTER — Encounter: Payer: Self-pay | Admitting: Family Medicine

## 2010-10-15 DIAGNOSIS — R197 Diarrhea, unspecified: Secondary | ICD-10-CM

## 2010-10-15 DIAGNOSIS — E119 Type 2 diabetes mellitus without complications: Secondary | ICD-10-CM

## 2010-10-15 DIAGNOSIS — M810 Age-related osteoporosis without current pathological fracture: Secondary | ICD-10-CM

## 2010-10-15 MED ORDER — GLIPIZIDE ER 5 MG PO TB24: 5.0000 mg | ORAL_TABLET | Freq: Every day | ORAL | Status: DC

## 2010-10-15 MED ORDER — AMOXICILLIN 500 MG PO TABS: 1000.0000 mg | ORAL_TABLET | Freq: Two times a day (BID) | ORAL | Status: AC

## 2010-10-15 NOTE — Assessment & Plan Note (Signed)
Likely due to metformin SE as she has gradaully noted issue worsening on higher dose. Return to lower dose. Call if not improving or if new symptoms, abd pain, blood in stool, fever etc.

## 2010-10-15 NOTE — Progress Notes (Signed)
  Subjective:    Patient ID: Marie Holmes, female    DOB: [birth year], 75 y.o.   MRN: [id number]  Sinusitis This is a new problem. The current episode started in the past 7 days (started with sore throat). The problem has been gradually worsening since onset. There has been no fever. Associated symptoms include chills, congestion, coughing, ear pain and a sore throat. Pertinent negatives include no headaches or shortness of breath. (Left ear pain, non productive cough from post nasal drip.) Past treatments include acetaminophen. The treatment provided no relief.   Recent DEXA showed osteoporosis in hip. Not interested in medication at this time.  2 weeks ago she had an episode of stool incontinence... Stool like soup.  She is on metfromin. She has continued to have episodes of this after meals. Occ abdominal cramps before BM once. No abdominal pain otherwise. No blood in stool.   Review of Systems  Constitutional: Positive for chills.  HENT: Positive for ear pain, congestion and sore throat.   Respiratory: Positive for cough. Negative for shortness of breath.   Cardiovascular: Negative for chest pain, palpitations and leg swelling.  Gastrointestinal: Negative for abdominal pain and blood in stool.  Neurological: Negative for headaches.       Objective:   Physical Exam  Constitutional: Vital signs are normal. She appears well-developed and well-nourished. She is cooperative.  Non-toxic appearance. She does not appear ill. No distress.  HENT:  Head: Normocephalic.  Right Ear: Hearing, tympanic membrane, external ear and ear canal normal. Tympanic membrane is not erythematous, not retracted and not bulging.  Left Ear: Hearing, tympanic membrane, external ear and ear canal normal. Tympanic membrane is not erythematous, not retracted and not bulging.  Nose: No mucosal edema or rhinorrhea. Right sinus exhibits no maxillary sinus tenderness and no frontal sinus tenderness. Left sinus exhibits  no maxillary sinus tenderness and no frontal sinus tenderness.  Mouth/Throat: Uvula is midline, oropharynx is clear and moist and mucous membranes are normal.  Eyes: Conjunctivae, EOM and lids are normal. Pupils are equal, round, and reactive to light. No foreign bodies found.  Neck: Trachea normal and normal range of motion. Neck supple. Carotid bruit is not present. No mass and no thyromegaly present.  Cardiovascular: Normal rate, regular rhythm, S1 normal, S2 normal, normal heart sounds, intact distal pulses and normal pulses.  Exam reveals no gallop and no friction rub.   No murmur heard. Pulmonary/Chest: Effort normal and breath sounds normal. Not tachypneic. No respiratory distress. She has no decreased breath sounds. She has no wheezes. She has no rhonchi. She has no rales.  Abdominal: Soft. Normal appearance and bowel sounds are normal. She exhibits no distension and no mass. There is no tenderness. There is no rebound.       Hyperactive bowel sounds  Neurological: She is alert.  Skin: Skin is warm, dry and intact. No rash noted.  Psychiatric: Her speech is normal and behavior is normal. Judgment and thought content normal. Her mood appears not anxious. Cognition and memory are normal. She does not exhibit a depressed mood.          Assessment & Plan:

## 2010-10-15 NOTE — Patient Instructions (Addendum)
Start amoxicillin x 10 days.  Also consider mucinex and nasal saline irrigation. Call if not improving at end of antibioitcs or shortness of breath. Decrease metfromin back to 1 tab po twice daily.  Call if diarrhea and incontinence continuing despite lower dose metformin. Start low dose glipizide XL 5 mg daily.

## 2010-10-15 NOTE — Assessment & Plan Note (Signed)
Lower metformin for SE. Add glipizide 5 mg XL. Follow up as scheduled.

## 2010-10-15 NOTE — Assessment & Plan Note (Addendum)
Refuses treatment of any type. Discussed risks and benefits. She voices understanding of risk not treating osteoporosis. Will work on ca, vit d and weight bearing exercsie.

## 2010-12-02 ENCOUNTER — Ambulatory Visit (INDEPENDENT_AMBULATORY_CARE_PROVIDER_SITE_OTHER): Payer: Medicare HMO

## 2010-12-02 DIAGNOSIS — Z23 Encounter for immunization: Secondary | ICD-10-CM

## 2010-12-09 ENCOUNTER — Other Ambulatory Visit (INDEPENDENT_AMBULATORY_CARE_PROVIDER_SITE_OTHER): Payer: Medicare HMO

## 2010-12-09 DIAGNOSIS — E119 Type 2 diabetes mellitus without complications: Secondary | ICD-10-CM

## 2010-12-09 LAB — HEMOGLOBIN A1C: Hgb A1c MFr Bld: 6.8 % — ABNORMAL HIGH (ref 4.6–6.5)

## 2010-12-16 ENCOUNTER — Ambulatory Visit (INDEPENDENT_AMBULATORY_CARE_PROVIDER_SITE_OTHER): Payer: Medicare HMO | Admitting: Family Medicine

## 2010-12-16 ENCOUNTER — Encounter: Payer: Self-pay | Admitting: Family Medicine

## 2010-12-16 DIAGNOSIS — R05 Cough: Secondary | ICD-10-CM | POA: Insufficient documentation

## 2010-12-16 DIAGNOSIS — Z9189 Other specified personal risk factors, not elsewhere classified: Secondary | ICD-10-CM

## 2010-12-16 DIAGNOSIS — I10 Essential (primary) hypertension: Secondary | ICD-10-CM

## 2010-12-16 DIAGNOSIS — R197 Diarrhea, unspecified: Secondary | ICD-10-CM

## 2010-12-16 DIAGNOSIS — E119 Type 2 diabetes mellitus without complications: Secondary | ICD-10-CM

## 2010-12-16 DIAGNOSIS — E785 Hyperlipidemia, unspecified: Secondary | ICD-10-CM

## 2010-12-16 MED ORDER — FLUTICASONE PROPIONATE 50 MCG/ACT NA SUSP: 2.0000 | Freq: Every day | NASAL | Status: DC

## 2010-12-16 MED ORDER — AZITHROMYCIN 250 MG PO TABS: ORAL_TABLET | ORAL | Status: AC

## 2010-12-16 NOTE — Assessment & Plan Note (Signed)
Improved on lower dose of metformin.

## 2010-12-16 NOTE — Assessment & Plan Note (Addendum)
Well controlled, but to avoid low.. Pt instructed to never skip meal, eat with taking this med. Space metfomrin out to possibly help with diarrhea. May need to decrease metformin  Further or change off glipizide if lows continue.

## 2010-12-16 NOTE — Assessment & Plan Note (Signed)
As well as some SOB in setting of post nasal drip, fluigd in left ear and sinus pressure. Will treat with antibiotics and nasal steroid, but not improving given smoking history.. Check PFTs for re-eval of COPD (on no meds) and CXR.

## 2010-12-16 NOTE — Assessment & Plan Note (Signed)
Well controlled. Continue current medication. Per pt some occ ups at home, will continue to follow.

## 2010-12-16 NOTE — Patient Instructions (Addendum)
Take the glipizide at lunch with biggest meal of day.  MAke sure to eat with this medicaiton. Take metformin one tab in AM and one at PM. Recheck in 3 moinths, but in meantime if  Still having low blood sugars or diarrhea is intolerable... Call. Start nasal steroid spray for ear congestion, allergic symptoms.  Repeat course of antibiotics for sinus  Infection.  Call if cough, shortness of breath not improving. Likely would need follow up appt if not resolving in next 2 weeks for lung function test and CXR.

## 2010-12-16 NOTE — Progress Notes (Signed)
Subjective:    Patient ID: Marie Holmes, female    DOB: [birth year], 75 y.o.   MRN: [id number]  HPI  Diabetes:  Improved control (A1C at 6.8) on lowered dose of metformin given SE of diarrhea...added glipizide for the last 2 month.  Diarrhea better, but not gone 100%. Using medications without difficulties: NO Hypoglycemic episodes: one episode of 50... Forgot to eat.,,  Hyperglycemic episodes:NOne Feet problems: Blood Sugars averaging: FBS 121-150 eye exam within last year:  Hypertension:  At goal on cozaar, toprol Using medication without problems or lightheadedness:  Chest pain with exertion:None Edema: Occ Short of breath: some with exertion... Not exercising. Average home BPs: occ running 155/80, pulse 80 Other issues:  Elevated Cholesterol:due for re-eval in 3  Months.  Since sinus infection in  09/2010.. Treated with antibiotics, symptoms resolved then gradually returned. Cough since then...still having some left ear ache, B facial pain, occ congestion. Mild  SOB with exertion in last few months. Occ wheeze. No smoking, but has extensive history. Has had pain in right mid back, intermittant Review of Systems  Constitutional: Negative for fever and fatigue.  HENT: Positive for ear pain, congestion, rhinorrhea, postnasal drip and sinus pressure. Negative for nosebleeds and sneezing.   Eyes: Negative for pain.  Respiratory: Positive for cough and shortness of breath. Negative for chest tightness.   Cardiovascular: Positive for leg swelling. Negative for chest pain and palpitations.  Gastrointestinal: Negative for abdominal pain.  Genitourinary: Negative for dysuria.       Objective:   Physical Exam  Constitutional: Vital signs are normal. She appears well-developed and well-nourished. She is cooperative.  Non-toxic appearance. She does not appear ill. No distress.  HENT:  Head: Normocephalic.  Right Ear: Hearing, tympanic membrane, external ear and ear canal normal.  Tympanic membrane is not erythematous, not retracted and not bulging.  Left Ear: Hearing, external ear and ear canal normal. Tympanic membrane is not erythematous, not retracted and not bulging. A middle ear effusion is present.  Nose: No mucosal edema or rhinorrhea. Right sinus exhibits no maxillary sinus tenderness and no frontal sinus tenderness. Left sinus exhibits no maxillary sinus tenderness and no frontal sinus tenderness.  Mouth/Throat: Uvula is midline, oropharynx is clear and moist and mucous membranes are normal.       No pus seen, clear fluid  left TM  Eyes: Conjunctivae, EOM and lids are normal. Pupils are equal, round, and reactive to light. No foreign bodies found.  Neck: Trachea normal and normal range of motion. Neck supple. Carotid bruit is not present. No mass and no thyromegaly present.  Cardiovascular: Normal rate, regular rhythm, S1 normal, S2 normal, normal heart sounds, intact distal pulses and normal pulses.  Exam reveals no gallop and no friction rub.   No murmur heard. Pulmonary/Chest: Effort normal and breath sounds normal. Not tachypneic. No respiratory distress. She has no decreased breath sounds. She has no wheezes. She has no rhonchi. She has no rales.  Abdominal: Soft. Normal appearance and bowel sounds are normal. There is no tenderness.  Musculoskeletal:       ttp over right mid back under scapula  Neurological: She is alert.  Skin: Skin is warm, dry and intact. No rash noted.  Psychiatric: She has a normal mood and affect. Her speech is normal and behavior is normal. Judgment and thought content normal. Her mood appears not anxious. Cognition and memory are normal. She does not exhibit a depressed mood.    Diabetic foot exam: Normal inspection  No skin breakdown No calluses  Normal DP pulses Normal sensation to light touch and monofilament Nails normal       Assessment & Plan:

## 2010-12-16 NOTE — Assessment & Plan Note (Addendum)
Due for re-eval next OV. 

## 2011-02-09 ENCOUNTER — Encounter: Payer: Self-pay | Admitting: Family Medicine

## 2011-02-09 ENCOUNTER — Ambulatory Visit (INDEPENDENT_AMBULATORY_CARE_PROVIDER_SITE_OTHER): Payer: Medicare HMO | Admitting: Family Medicine

## 2011-02-09 VITALS — BP 120/72 | HR 77 | Temp 98.5°F | Ht 61.0 in | Wt 140.8 lb

## 2011-02-09 DIAGNOSIS — R05 Cough: Secondary | ICD-10-CM

## 2011-02-09 DIAGNOSIS — J209 Acute bronchitis, unspecified: Secondary | ICD-10-CM

## 2011-02-09 DIAGNOSIS — J449 Chronic obstructive pulmonary disease, unspecified: Secondary | ICD-10-CM

## 2011-02-09 MED ORDER — AZITHROMYCIN 250 MG PO TABS: ORAL_TABLET | ORAL | Status: DC

## 2011-02-09 MED ORDER — ALBUTEROL SULFATE HFA 108 (90 BASE) MCG/ACT IN AERS: 2.0000 | INHALATION_SPRAY | Freq: Four times a day (QID) | RESPIRATORY_TRACT | Status: DC | PRN

## 2011-02-09 MED ORDER — HYDROCODONE-HOMATROPINE 5-1.5 MG/5ML PO SYRP: ORAL_SOLUTION | ORAL | Status: AC

## 2011-02-09 NOTE — Progress Notes (Signed)
  Patient Name: Marie Holmes Date of Birth: [birth year] Age: 75 y.o. Medical Record Number: [id number] Gender: female  History of Present Illness:  Marie Holmes is a 75 y.o. very pleasant female patient with COPD who presents with the following:  Left side of face hurts and a lot of green drainage. Was supposed to sing in the christmas plan in church. 5 days. Having some chills, but no fever right now. Having trouble keeping warm.  Wants to stay in bed. Coughing all the time. Generally feels terrible. No global myalgias or arthralgias  Past Medical History, Surgical History, Social History, Family History, and Problem List have been reviewed in EHR and updated if relevant.  Review of Systems: ROS: GEN: Acute illness details above GI: Tolerating PO intake GU: maintaining adequate hydration and urination Pulm: No SOB Interactive and getting along well at home.  Otherwise, ROS is as per the HPI.   Physical Examination: Filed Vitals:   02/09/11 1537  BP: 120/72  Pulse: 77  Temp: 98.5 F (36.9 C)  TempSrc: Oral  Height: 5\' 1"  (1.549 m)  Weight: 140 lb 12.8 oz (63.866 kg)  SpO2: 97%    Body mass index is 26.60 kg/(m^2).   GEN: A and O x 3. WDWN. NAD.    ENT: Nose clear, ext NML.  No LAD.  No JVD.  TM's clear. Oropharynx clear.  PULM: Normal WOB, no distress. No crackles, wheezes, rhonchi. Decreased BS CV: RRR, no M/G/R, No rubs, No JVD.   EXT: warm and well-perfused, No c/c/e. PSYCH: Pleasant and conversant.   Assessment and Plan: 1. Bronchitis, acute  azithromycin (ZITHROMAX Z-PAK) 250 MG tablet, albuterol (PROVENTIL HFA;VENTOLIN HFA) 108 (90 BASE) MCG/ACT inhaler, HYDROcodone-homatropine (HYCODAN) 5-1.5 MG/5ML syrup  2. Cough    3. COPD      Bronchitis with mild copd flare

## 2011-02-16 ENCOUNTER — Telehealth: Payer: Self-pay | Admitting: Internal Medicine

## 2011-02-16 NOTE — Telephone Encounter (Signed)
Patient called and stated she is still having symptoms of chest congestion, chills, and was Rx a Z-pac on 02/09/11 and wanted to know if she could get a refill on the Z-pak.  She did her symptoms are better but not completely gone.  She saw you on the 19th for this.

## 2011-02-16 NOTE — Telephone Encounter (Signed)
Patient advised.

## 2011-02-16 NOTE — Telephone Encounter (Signed)
It is still active in her system and will be for a few more days.

## 2011-03-15 ENCOUNTER — Other Ambulatory Visit (INDEPENDENT_AMBULATORY_CARE_PROVIDER_SITE_OTHER): Payer: MEDICARE

## 2011-03-15 DIAGNOSIS — E119 Type 2 diabetes mellitus without complications: Secondary | ICD-10-CM

## 2011-03-15 DIAGNOSIS — E785 Hyperlipidemia, unspecified: Secondary | ICD-10-CM

## 2011-03-15 DIAGNOSIS — I10 Essential (primary) hypertension: Secondary | ICD-10-CM

## 2011-03-15 LAB — COMPREHENSIVE METABOLIC PANEL
Albumin: 4.1 g/dL (ref 3.5–5.2)
CO2: 28 mEq/L (ref 19–32)
Calcium: 9.5 mg/dL (ref 8.4–10.5)
Chloride: 103 mEq/L (ref 96–112)
GFR: 81.56 mL/min (ref >60.00)
Glucose, Bld: 149 mg/dL — ABNORMAL HIGH (ref 70–99)
Sodium: 140 mEq/L (ref 135–145)
Total Bilirubin: 0.7 mg/dL (ref 0.3–1.2)
Total Protein: 6.9 g/dL (ref 6.0–8.3)

## 2011-03-15 LAB — LIPID PANEL
Cholesterol: 195 mg/dL (ref 0–200)
VLDL: 22.6 mg/dL (ref 0.0–40.0)

## 2011-03-18 ENCOUNTER — Encounter: Payer: Self-pay | Admitting: Family Medicine

## 2011-03-18 ENCOUNTER — Ambulatory Visit (INDEPENDENT_AMBULATORY_CARE_PROVIDER_SITE_OTHER): Payer: MEDICARE | Admitting: Family Medicine

## 2011-03-18 DIAGNOSIS — E785 Hyperlipidemia, unspecified: Secondary | ICD-10-CM

## 2011-03-18 DIAGNOSIS — I10 Essential (primary) hypertension: Secondary | ICD-10-CM

## 2011-03-18 DIAGNOSIS — E119 Type 2 diabetes mellitus without complications: Secondary | ICD-10-CM

## 2011-03-18 LAB — HM DIABETES FOOT EXAM

## 2011-03-18 MED ORDER — LOSARTAN POTASSIUM 50 MG PO TABS: 50.0000 mg | ORAL_TABLET | Freq: Every day | ORAL | Status: DC

## 2011-03-18 MED ORDER — INSULIN PEN NEEDLE 29G X 12.7MM MISC: 1.0000 | Freq: Two times a day (BID) | Status: DC

## 2011-03-18 MED ORDER — METOPROLOL SUCCINATE ER 100 MG PO TB24: 100.0000 mg | ORAL_TABLET | Freq: Every day | ORAL | Status: DC

## 2011-03-18 MED ORDER — EXENATIDE 5 MCG/0.02ML ~~LOC~~ SOPN: 5.0000 ug | PEN_INJECTOR | Freq: Two times a day (BID) | SUBCUTANEOUS | Status: DC

## 2011-03-18 NOTE — Progress Notes (Signed)
Subjective:    Patient ID: Marie Holmes, female    DOB: 06/01/1935, 76 y.o.   MRN: [id number]  HPI  When she wakes up in morning..she has excessive sweating and hand shaking/tremor in last 3-4 months. Worse in the last month. Worse in morning than in evening. No shuffling. Good arm swing. Fatigued. Very mobile affect.  Brother with parkinson's disease.    Diabetes: Well controlled on current meds but she is not tolerating them well. Went back on metformin 3 tabs daily given glipizide was worse  Occ sees metformin tablet in stool, not digesting completely. Continues to have  occ incontinence and diarrhea with metformin at this dose. Lab Results  Component Value Date   HGBA1C 6.8* 03/15/2011  Hypoglycemic episodes:None Hyperglycemic episodes:None Feet problems:None Blood Sugars averaging: [id number] eye exam within last year:yes.   She has started smoking again !!! because can help with tremor.  Elevated Cholesterol: Worsened control... On no meds.  Pt refuses statin meds. Previously almost at goal LDL <100. Diet compliance: moderate, some decreased appetitie  Exercise: Minimal. Other complaints:  Hypertension:  Well controlled on current meds.  Using medication without problems or lightheadedness:  Chest pain with exertion: Edema: Short of breath: Average home BPs: Other issues:    Review of Systems  Constitutional: Negative for fever and fatigue.  HENT: Negative for ear pain.   Eyes: Negative for pain.  Respiratory: Negative for chest tightness and shortness of breath.   Cardiovascular: Negative for chest pain, palpitations and leg swelling.  Gastrointestinal: Negative for abdominal pain.  Genitourinary: Negative for dysuria.       Objective:   Physical Exam  Constitutional: Vital signs are normal. She appears well-developed and well-nourished. She is cooperative.  Non-toxic appearance. She does not appear ill. No distress.  HENT:  Head: Normocephalic.    Right Ear: Hearing, tympanic membrane, external ear and ear canal normal. Tympanic membrane is not erythematous, not retracted and not bulging.  Left Ear: Hearing, tympanic membrane, external ear and ear canal normal. Tympanic membrane is not erythematous, not retracted and not bulging.  Nose: No mucosal edema or rhinorrhea. Right sinus exhibits no maxillary sinus tenderness and no frontal sinus tenderness. Left sinus exhibits no maxillary sinus tenderness and no frontal sinus tenderness.  Mouth/Throat: Uvula is midline, oropharynx is clear and moist and mucous membranes are normal.  Eyes: Conjunctivae, EOM and lids are normal. Pupils are equal, round, and reactive to light. No foreign bodies found.  Neck: Trachea normal and normal range of motion. Neck supple. Carotid bruit is not present. No mass and no thyromegaly present.  Cardiovascular: Normal rate, regular rhythm, S1 normal, S2 normal, normal heart sounds, intact distal pulses and normal pulses.  Exam reveals no gallop and no friction rub.   No murmur heard. Pulmonary/Chest: Effort normal and breath sounds normal. Not tachypneic. No respiratory distress. She has no decreased breath sounds. She has no wheezes. She has no rhonchi. She has no rales.  Abdominal: Soft. Normal appearance and bowel sounds are normal. There is no tenderness.  Neurological: She is alert.  Skin: Skin is warm, dry and intact. No rash noted.  Psychiatric: Her speech is normal and behavior is normal. Judgment and thought content normal. Her mood appears not anxious. Cognition and memory are normal. She does not exhibit a depressed mood.  Diabetic foot exam: Normal inspection No skin breakdown No calluses  Normal DP pulses Normal sensation to light touch and monofilament Nails normal  Assessment & Plan:

## 2011-03-18 NOTE — Patient Instructions (Signed)
Start Byetta twice daily with meals.  Return for 3 month DM follow up in 06/2011. Fasting labs prior.

## 2011-03-23 NOTE — Assessment & Plan Note (Signed)
Pt feels tremor as well as diarrhea is due to metformin. SHe wants to change meds.  Will stop metformin and try Byetta BID. She will call in 1 month with update on blood sugars. We will likely need to increase to 10 mcg daily. Reviewed med in detail with her. Samples given. Encouraged exercise, weight loss, healthy eating habits.

## 2011-03-23 NOTE — Assessment & Plan Note (Signed)
Well controlled. Continue current medication.  

## 2011-03-23 NOTE — Assessment & Plan Note (Signed)
Inadequate control refuses statin med. Can consider welchol for DM  and cho control at  next OV once her current DM med concerns are resolved.Marland Kitchen

## 2011-03-31 ENCOUNTER — Encounter: Payer: Self-pay | Admitting: Family Medicine

## 2011-03-31 ENCOUNTER — Ambulatory Visit (INDEPENDENT_AMBULATORY_CARE_PROVIDER_SITE_OTHER): Payer: MEDICARE | Admitting: Family Medicine

## 2011-03-31 VITALS — BP 110/70 | HR 91 | Temp 98.6°F | Wt 134.2 lb

## 2011-03-31 DIAGNOSIS — R300 Dysuria: Secondary | ICD-10-CM | POA: Insufficient documentation

## 2011-03-31 DIAGNOSIS — R110 Nausea: Secondary | ICD-10-CM

## 2011-03-31 DIAGNOSIS — E119 Type 2 diabetes mellitus without complications: Secondary | ICD-10-CM

## 2011-03-31 LAB — POCT URINALYSIS DIPSTICK
Bilirubin, UA: NEGATIVE
Ketones, UA: NEGATIVE
Leukocytes, UA: NEGATIVE
Protein, UA: NEGATIVE
Spec Grav, UA: 1.02

## 2011-03-31 LAB — CBC WITH DIFFERENTIAL/PLATELET
Basophils Relative: 0.2 % (ref 0.0–3.0)
Eosinophils Relative: 1.6 % (ref 0.0–5.0)
HCT: 41.1 % (ref 36.0–46.0)
Hemoglobin: 13.9 g/dL (ref 12.0–15.0)
Lymphs Abs: 2 10*3/uL (ref 0.7–4.0)
MCV: 92.1 fl (ref 78.0–100.0)
Monocytes Absolute: 0.8 10*3/uL (ref 0.1–1.0)
Monocytes Relative: 8.1 % (ref 3.0–12.0)
Neutro Abs: 7.4 10*3/uL (ref 1.4–7.7)
RBC: 4.47 Mil/uL (ref 3.87–5.11)
WBC: 10.4 10*3/uL (ref 4.5–10.5)

## 2011-03-31 LAB — LIPASE: Lipase: 26 U/L (ref 11.0–59.0)

## 2011-03-31 MED ORDER — METFORMIN HCL ER 500 MG PO TB24: 500.0000 mg | ORAL_TABLET | Freq: Every day | ORAL | Status: DC

## 2011-03-31 MED ORDER — SITAGLIPTIN PHOSPHATE 100 MG PO TABS: 100.0000 mg | ORAL_TABLET | Freq: Every day | ORAL | Status: DC

## 2011-03-31 NOTE — Progress Notes (Signed)
Subjective:    Patient ID: Marie Holmes, female    DOB: [birth year], 76 y.o.   MRN: [id number]  HPI 76 yo pt of Dr. Ermalene Searing here to discuss her medications.  DM- when she saw Dr. Leonard Schwartz on 1/25, complained of sweating, diarrhea and shakiness that she was attributing to Metformin.Metformin stopped and started Byetta. She says this caused tachycardia and vomiting. She wants her lipase checked (package insert mentioned increased risk of pancreatitis).  She cannot tolerate glypizide either.  Stopped taking Byetta and restarted Metformin 2 days ago.  Occ sees metformin tablet in stool, not digesting completely. Continues to have  occ incontinence and diarrhea with metformin at this dose. Lab Results  Component Value Date   HGBA1C 6.8* 03/15/2011   Hypoglycemic episodes:None Hyperglycemic episodes:None Feet problems:None Blood Sugars averaging: [id number] eye exam within last year:yes.  Has no energy.  ?UTI- two weeks of increased urinary frequency.  No dysuria, back pain, nausea or vomiting. No fever.  Patient Active Problem List  Diagnoses  . COLONIC POLYPS  . DIABETES MELLITUS, TYPE II  . HYPERLIPIDEMIA  . OBESITY, NOS  . ANEMIA, IRON DEFICIENCY, UNSPEC.  Marland Kitchen ANXIETY  . HYPERTENSION  . VENOUS INSUFFICIENCY, CHRONIC  . COPD  . GASTROESOPHAGEAL REFLUX, NO ESOPHAGITIS  . IRRITABLE BOWEL SYNDROME  . OSTEOARTHRITIS  . OSTEOARTHRITIS, MULTI SITES  . TRIGGER FINGER  . Osteoporosis, unspecified  . INSOMNIA NOS  . INCONTINENCE, URGE  . Dysuria   Past Medical History  Diagnosis Date  . Benign tumor of breast 2004    left  . Colon polyp   . Broken ribs     history of several broken ribs on right and compression  fx  . Fibroma of skin     Multiple fibromas/lipomas on arms and legs B  . Pyelonephritis   . Anxiety   . COPD (chronic obstructive pulmonary disease)   . Hyperlipidemia   . Osteoarthritis   . History of CT scan of abdomen 05/23/2003    Mild fatty liver inf, tiny  hypodensity in right lobe of liver  . Kidney stone 02/21/1981    Right/removed   Past Surgical History  Procedure Date  . Appendectomy 02/21/1961  . Exploratory laparotomy 02/21/1970    for ? adhesions  . Abdominal hysterectomy 02/21/1970    right ovary remaining   History  Substance Use Topics  . Smoking status: Former Games developer  . Smokeless tobacco: Never Used   Comment: 3/4 pack X 50 years  . Alcohol Use: No   Family History  Problem Relation Age of Onset  . Parkinsonism Brother    Allergies  Allergen Reactions  . Sulfonamide Derivatives     REACTION: Rash and swelling   Current Outpatient Prescriptions on File Prior to Visit  Medication Sig Dispense Refill  . albuterol (PROVENTIL HFA;VENTOLIN HFA) 108 (90 BASE) MCG/ACT inhaler Inhale 2 puffs into the lungs every 6 (six) hours as needed for wheezing.  1 Inhaler  3  . ALPRAZolam (XANAX) 0.5 MG tablet Take 1 tablet (0.5 mg total) by mouth at bedtime as needed. For insomnia  30 tablet  0  . aspirin 81 MG tablet Take 81 mg by mouth daily.        . citalopram (CELEXA) 40 MG tablet Take 40 mg by mouth daily.        . fluticasone (FLONASE) 50 MCG/ACT nasal spray Place 2 sprays into the nose daily.  16 g  2  . Insulin Pen Needle 29G X 12.7MM  MISC 1 each by Does not apply route 2 (two) times daily.  100 each  0  . losartan (COZAAR) 50 MG tablet Take 1 tablet (50 mg total) by mouth daily.  90 tablet  3  . metoprolol succinate (TOPROL-XL) 100 MG 24 hr tablet Take 1 tablet (100 mg total) by mouth daily.  90 tablet  3  . Multiple Vitamin (MULTIVITAMIN) tablet Take 1 tablet by mouth daily.        Marland Kitchen omeprazole (PRILOSEC) 20 MG capsule Take 2 capsules (40 mg total) by mouth daily.  90 capsule  3   The PMH, PSH, Social History, Family History, Medications, and allergies have been reviewed in [facility], and have been updated if relevant.   Review of Systems  See HPI     Objective:   Physical Exam  BP 110/70  Pulse 91  Temp(Src) 98.6 F  (37 C) (Oral)  Wt 134 lb 4 oz (60.895 kg)  Constitutional: Vital signs are normal. She appears well-developed and well-nourished. She is cooperative.  Non-toxic appearance. She does not appear ill. No distress.  HENT:  Head: Normocephalic.  Cardiovascular: Normal rate, regular rhythm, S1 normal, S2 normal, normal heart sounds, intact distal pulses and normal pulses.  Exam reveals no gallop and no friction rub.   No murmur heard. Pulmonary/Chest: Effort normal and breath sounds normal. Not tachypneic. No respiratory distress. She has no decreased breath sounds. She has no wheezes. She has no rhonchi. She has no rales.  Abdominal: Soft. Normal appearance and bowel sounds are normal. There is no tenderness.  Neurological: She is alert.  Skin: Skin is warm, dry and intact. No rash noted.  Psychiatric: Her speech is normal and behavior is normal. Judgment and thought content normal. Her mood appears not anxious. Cognition and memory are normal. She does not exhibit a depressed mood.      Assessment & Plan:   1. DIABETES MELLITUS, TYPE II  Deteriorated.  Will decrease dose of Metformin and start Januvia 100 mg daily. The patient indicates understanding of these issues and agrees with the plan. She will call me next week with update of her CBGs.   2. Urinary frequency- UA neg.  Continue to monitor symptoms, may be related to #1. POCT urinalysis dipstick  3. Nausea  Resolved, secondary to Byetta.  See above.  Will check Lipase per pt request although I did reassure that this was unlikely. CBC w/Diff, Lipase

## 2011-03-31 NOTE — Patient Instructions (Addendum)
Do not restart the Byetta. You are going start taking Metformin 500 mg at breakfast. I am adding Januvia 100 mg daily. If your sugars get too high (above 300), start taking a Metformin in the evening as well. Call me in 1-2 weeks with an update.

## 2011-04-01 ENCOUNTER — Telehealth: Payer: Self-pay | Admitting: *Deleted

## 2011-04-01 ENCOUNTER — Telehealth: Payer: Self-pay | Admitting: Family Medicine

## 2011-04-01 NOTE — Telephone Encounter (Signed)
[facility] pharmacy and left a message on pharmacy voicemail advising as instructed.

## 2011-04-01 NOTE — Telephone Encounter (Signed)
Metformin should be 500 mg daily.

## 2011-04-01 NOTE — Telephone Encounter (Signed)
[facility] pharmacy wants to clarify directions for metformin b/c their was 2 directions on rx.

## 2011-04-01 NOTE — Telephone Encounter (Signed)
Spoke with Marie Holmes and advised via telephne, Rx is for Metformin XL 500mg .

## 2011-04-04 ENCOUNTER — Other Ambulatory Visit: Payer: Self-pay | Admitting: *Deleted

## 2011-04-04 MED ORDER — CITALOPRAM HYDROBROMIDE 40 MG PO TABS: 40.0000 mg | ORAL_TABLET | Freq: Every day | ORAL | Status: DC

## 2011-04-04 NOTE — Telephone Encounter (Signed)
Left message on machine at home, Rx sent to [facility] pharmacy.

## 2011-04-04 NOTE — Telephone Encounter (Signed)
Patient called stating that she needs a new script sent to her new mail order pharmacy [facility] for her anti-depressant.  Please advise number of refills.

## 2011-05-02 ENCOUNTER — Encounter: Payer: Self-pay | Admitting: Family Medicine

## 2011-05-02 ENCOUNTER — Ambulatory Visit (INDEPENDENT_AMBULATORY_CARE_PROVIDER_SITE_OTHER): Payer: MEDICARE | Admitting: Family Medicine

## 2011-05-02 VITALS — BP 120/68 | HR 79 | Temp 98.0°F | Ht 61.0 in | Wt 134.4 lb

## 2011-05-02 DIAGNOSIS — J0100 Acute maxillary sinusitis, unspecified: Secondary | ICD-10-CM

## 2011-05-02 MED ORDER — AMOXICILLIN 875 MG PO TABS: 875.0000 mg | ORAL_TABLET | Freq: Two times a day (BID) | ORAL | Status: AC

## 2011-05-02 NOTE — Progress Notes (Signed)
  Patient Name: Marie Holmes Date of Birth: Jan 17, 1936 Age: 76 y.o. Medical Record Number: [id number] Gender: female Date of Encounter: 05/02/2011  History of Present Illness:  Marie Holmes is a 76 y.o. very pleasant female patient who presents with the following:  Friday night, sounds sick. Maxillary facial pain and pain in the throat and ears are hurting. Fever some.  Chills Eating and drinking ok.  Urinating normal.  Mostly sinus pain and congestion  Past Medical History, Surgical History, Social History, Family History, Problem List, Medications, and Allergies have been reviewed and updated if relevant.  Review of Systems: ROS: GEN: Acute illness details above GI: Tolerating PO intake GU: maintaining adequate hydration and urination Pulm: No SOB Interactive and getting along well at home.  Otherwise, ROS is as per the HPI.   Physical Examination: Filed Vitals:   05/02/11 1524  BP: 120/68  Pulse: 79  Temp: 98 F (36.7 C)  TempSrc: Oral  Height: 5\' 1"  (1.549 m)  Weight: 134 lb 6.4 oz (60.963 kg)  SpO2: 96%    Body mass index is 25.39 kg/(m^2).   Gen: WDWN, NAD; alert,appropriate and cooperative throughout exam  HEENT: Normocephalic and atraumatic. Throat clear, w/o exudate, no LAD, R TM clear, L TM - good landmarks, No fluid present. rhinnorhea.  Left frontal and maxillary sinuses: Tender max Right frontal and maxillary sinuses: Tendermax  Neck: No ant or post LAD CV: RRR, No M/G/R Pulm: Breathing comfortably in no resp distress. no w/c/r Abd: S,NT,ND,+BS Extr: no c/c/e Psych: full affect, pleasant   Assessment and Plan:  1. Sinusitis, acute maxillary  amoxicillin (AMOXIL) 875 MG tablet    Medications Today: Meds ordered this encounter  Medications  . amoxicillin (AMOXIL) 875 MG tablet    Sig: Take 1 tablet (875 mg total) by mouth 2 (two) times daily.    Dispense:  20 tablet    Refill:  0  . amoxicillin (AMOXIL) 875 MG tablet    Sig: Take 1  tablet (875 mg total) by mouth 2 (two) times daily.    Dispense:  20 tablet    Refill:  0    Acute sinusitis: ABX as below.  Refer to the patient instructions sections for details of plan shared with patient.  Reviewed symptomatic care as well as ABX in this case.

## 2011-05-23 ENCOUNTER — Other Ambulatory Visit: Payer: Self-pay

## 2011-05-23 MED ORDER — SITAGLIPTIN PHOSPHATE 100 MG PO TABS: 100.0000 mg | ORAL_TABLET | Freq: Every day | ORAL | Status: DC

## 2011-05-23 NOTE — Telephone Encounter (Signed)
Ok to refill as written below.

## 2011-05-23 NOTE — Telephone Encounter (Signed)
Pt said she saw Dr Dayton Martes on 03/31/11 and was supposed to call back 1-2 wks to update her on how BS was doing/ Pt said she was doing so well she did not call until she got letter from [facility] that she would need new rx for Januvia in about 1 week. Pt said she takes metformin 500 mg in AM and Januvia 100 mg at 5 pm. Pt said average fasting BS is 140 and when checks BS before lunch usually runs around 130.Please advise. Pt can be reached at [phone].

## 2011-06-21 ENCOUNTER — Other Ambulatory Visit (INDEPENDENT_AMBULATORY_CARE_PROVIDER_SITE_OTHER): Payer: MEDICARE

## 2011-06-21 DIAGNOSIS — E785 Hyperlipidemia, unspecified: Secondary | ICD-10-CM

## 2011-06-21 DIAGNOSIS — E119 Type 2 diabetes mellitus without complications: Secondary | ICD-10-CM

## 2011-06-21 LAB — COMPREHENSIVE METABOLIC PANEL
Albumin: 4.1 g/dL (ref 3.5–5.2)
BUN: 13 mg/dL (ref 6–23)
Calcium: 9.6 mg/dL (ref 8.4–10.5)
Chloride: 104 mEq/L (ref 96–112)
Creatinine, Ser: 0.7 mg/dL (ref 0.4–1.2)
Glucose, Bld: 162 mg/dL — ABNORMAL HIGH (ref 70–99)
Potassium: 4.2 mEq/L (ref 3.5–5.1)

## 2011-06-21 LAB — LIPID PANEL
Cholesterol: 209 mg/dL — ABNORMAL HIGH (ref 0–200)
Triglycerides: 166 mg/dL — ABNORMAL HIGH (ref 0.0–149.0)

## 2011-06-21 LAB — HEMOGLOBIN A1C: Hgb A1c MFr Bld: 6.8 % — ABNORMAL HIGH (ref 4.6–6.5)

## 2011-06-28 ENCOUNTER — Ambulatory Visit (INDEPENDENT_AMBULATORY_CARE_PROVIDER_SITE_OTHER): Payer: MEDICARE | Admitting: Family Medicine

## 2011-06-28 ENCOUNTER — Encounter: Payer: Self-pay | Admitting: Family Medicine

## 2011-06-28 VITALS — BP 118/62 | HR 80 | Temp 98.0°F | Wt 129.0 lb

## 2011-06-28 DIAGNOSIS — E119 Type 2 diabetes mellitus without complications: Secondary | ICD-10-CM

## 2011-06-28 MED ORDER — ALPRAZOLAM 0.5 MG PO TABS: 0.5000 mg | ORAL_TABLET | Freq: Every evening | ORAL | Status: DC | PRN

## 2011-06-28 MED ORDER — METFORMIN HCL ER 500 MG PO TB24: 500.0000 mg | ORAL_TABLET | Freq: Every day | ORAL | Status: DC

## 2011-06-28 NOTE — Progress Notes (Signed)
Subjective:    Patient ID: Marie Holmes, female    DOB: 1936-01-04, 76 y.o.   MRN: [id number]  HPI 76 yo pt who recently transferred care with me here for follow up.  DM- when she saw Marie Holmes on 1/25, complained of sweating, diarrhea and shakiness that she was attributing to Metformin. Metformin stopped and started [place]. She says this caused tachycardia and vomiting. She cannot tolerate glypizide either.  In February, we decreased dose of Metformin and started  Januvia 100 mg daily.  Diarrhea and nausea resolved.   Lab Results  Component Value Date   HGBA1C 6.8* 06/21/2011   Hypoglycemic episodes:None Hyperglycemic episodes:None Feet problems:None Blood Sugars averaging: FBS 61-140. eye exam within last year:yes.   Lab Results  Component Value Date   CHOL 209* 06/21/2011   HDL 52.10 06/21/2011   LDLCALC 122* 03/15/2011   LDLDIRECT 146.7 06/21/2011   TRIG 166.0* 06/21/2011   CHOLHDL 4 06/21/2011     Patient Active Problem List  Diagnoses  . COLONIC POLYPS  . DIABETES MELLITUS, TYPE II  . HYPERLIPIDEMIA  . OBESITY, NOS  . ANEMIA, IRON DEFICIENCY, UNSPEC.  Marland Kitchen ANXIETY  . HYPERTENSION  . VENOUS INSUFFICIENCY, CHRONIC  . COPD  . GASTROESOPHAGEAL REFLUX, NO ESOPHAGITIS  . IRRITABLE BOWEL SYNDROME  . OSTEOARTHRITIS  . OSTEOARTHRITIS, MULTI SITES  . TRIGGER FINGER  . Osteoporosis, unspecified  . INSOMNIA NOS  . INCONTINENCE, URGE  . Dysuria   Past Medical History  Diagnosis Date  . Benign tumor of breast 2004    left  . Colon polyp   . Broken ribs     history of several broken ribs on right and compression  fx  . Fibroma of skin     Multiple fibromas/lipomas on arms and legs B  . Pyelonephritis   . Anxiety   . COPD (chronic obstructive pulmonary disease)   . Hyperlipidemia   . Osteoarthritis   . History of CT scan of abdomen 05/23/2003    Mild fatty liver inf, tiny hypodensity in right lobe of liver  . Kidney stone 02/21/1981    Right/removed   Past  Surgical History  Procedure Date  . Appendectomy 02/21/1961  . Exploratory laparotomy 02/21/1970    for ? adhesions  . Abdominal hysterectomy 02/21/1970    right ovary remaining   History  Substance Use Topics  . Smoking status: Former Games developer  . Smokeless tobacco: Never Used   Comment: 3/4 pack X 50 years  . Alcohol Use: No   Family History  Problem Relation Age of Onset  . Parkinsonism Brother    Allergies  Allergen Reactions  . Sulfonamide Derivatives     REACTION: Rash and swelling   Current Outpatient Prescriptions on File Prior to Visit  Medication Sig Dispense Refill  . ALPRAZolam (XANAX) 0.5 MG tablet Take 1 tablet (0.5 mg total) by mouth at bedtime as needed. For insomnia  30 tablet  0  . aspirin 81 MG tablet Take 81 mg by mouth daily.        . citalopram (CELEXA) 40 MG tablet Take 1 tablet (40 mg total) by mouth daily.  90 tablet  3  . fluticasone (FLONASE) 50 MCG/ACT nasal spray Place 2 sprays into the nose daily.  16 g  2  . losartan (COZAAR) 50 MG tablet Take 1 tablet (50 mg total) by mouth daily.  90 tablet  3  . metFORMIN (GLUCOPHAGE-XR) 500 MG 24 hr tablet Take 500 mg by mouth daily with  breakfast.      . metoprolol succinate (TOPROL-XL) 100 MG 24 hr tablet Take 1 tablet (100 mg total) by mouth daily.  90 tablet  3  . Multiple Vitamin (MULTIVITAMIN) tablet Take 1 tablet by mouth daily.        Marland Kitchen omeprazole (PRILOSEC) 20 MG capsule Take 2 capsules (40 mg total) by mouth daily.  90 capsule  3  . sitaGLIPtin (JANUVIA) 100 MG tablet Take 1 tablet (100 mg total) by mouth daily.  30 tablet  6   The PMH, PSH, Social History, Family History, Medications, and allergies have been reviewed in [facility], and have been updated if relevant.   Review of Systems  See HPI     Objective:   Physical Exam  BP 118/62  Pulse 80  Temp(Src) 98 F (36.7 C) (Oral)  Wt 129 lb (58.514 kg)  Constitutional: Vital signs are normal. She appears well-developed and well-nourished. She is  cooperative.  Non-toxic appearance. She does not appear ill. No distress.  HENT:  Head: Normocephalic.  Cardiovascular: Normal rate, regular rhythm, S1 normal, S2 normal, normal heart sounds, intact distal pulses and normal pulses.  Exam reveals no gallop and no friction rub.   No murmur heard. Pulmonary/Chest: Effort normal and breath sounds normal. Not tachypneic. No respiratory distress. She has no decreased breath sounds. She has no wheezes. She has no rhonchi. She has no rales.  Abdominal: Soft. Normal appearance and bowel sounds are normal. There is no tenderness.  Neurological: She is alert.  Skin: Skin is warm, dry and intact. No rash noted.  Psychiatric: Her speech is normal and behavior is normal. Judgment and thought content normal. Her mood appears not anxious. Cognition and memory are normal. She does not exhibit a depressed mood.      Assessment & Plan:   1 DM-  Stable. Continue current meds. Follow up in 3 months.

## 2011-06-28 NOTE — Patient Instructions (Addendum)
Good to see you. Let's keep your diabetes medications the way they are. Please come back to see me in 3 months.

## 2011-09-27 ENCOUNTER — Other Ambulatory Visit: Payer: Self-pay | Admitting: Family Medicine

## 2011-09-27 ENCOUNTER — Ambulatory Visit (INDEPENDENT_AMBULATORY_CARE_PROVIDER_SITE_OTHER): Payer: MEDICARE | Admitting: Family Medicine

## 2011-09-27 ENCOUNTER — Encounter: Payer: Self-pay | Admitting: Family Medicine

## 2011-09-27 ENCOUNTER — Ambulatory Visit (INDEPENDENT_AMBULATORY_CARE_PROVIDER_SITE_OTHER)
Admission: RE | Admit: 2011-09-27 | Discharge: 2011-09-27 | Disposition: A | Discharge status: Home / Self Care | Payer: MEDICARE | Source: Ambulatory Visit | Attending: Family Medicine | Admitting: Family Medicine

## 2011-09-27 VITALS — BP 140/68 | HR 60 | Temp 97.4°F | Wt 124.0 lb

## 2011-09-27 DIAGNOSIS — R634 Abnormal weight loss: Secondary | ICD-10-CM | POA: Insufficient documentation

## 2011-09-27 DIAGNOSIS — R05 Cough: Secondary | ICD-10-CM

## 2011-09-27 DIAGNOSIS — E119 Type 2 diabetes mellitus without complications: Secondary | ICD-10-CM

## 2011-09-27 DIAGNOSIS — R9389 Abnormal findings on diagnostic imaging of other specified body structures: Secondary | ICD-10-CM

## 2011-09-27 LAB — COMPREHENSIVE METABOLIC PANEL
Albumin: 3.8 g/dL (ref 3.5–5.2)
BUN: 11 mg/dL (ref 6–23)
CO2: 29 mEq/L (ref 19–32)
Calcium: 9.1 mg/dL (ref 8.4–10.5)
Chloride: 101 mEq/L (ref 96–112)
GFR: 80.19 mL/min (ref >60.00)
Glucose, Bld: 108 mg/dL — ABNORMAL HIGH (ref 70–99)
Potassium: 4.1 mEq/L (ref 3.5–5.1)

## 2011-09-27 LAB — CBC WITH DIFFERENTIAL/PLATELET
Basophils Absolute: 0 10*3/uL (ref 0.0–0.1)
Basophils Relative: 0.3 % (ref 0.0–3.0)
Eosinophils Absolute: 0.2 10*3/uL (ref 0.0–0.7)
MCHC: 32.9 g/dL (ref 30.0–36.0)
MCV: 93.1 fl (ref 78.0–100.0)
Monocytes Absolute: 0.8 10*3/uL (ref 0.1–1.0)
Neutrophils Relative %: 65 % (ref 43.0–77.0)
Platelets: 243 10*3/uL (ref 150.0–400.0)
RBC: 4.4 Mil/uL (ref 3.87–5.11)
RDW: 13.5 % (ref 11.5–14.6)

## 2011-09-27 LAB — HEMOGLOBIN A1C: Hgb A1c MFr Bld: 6.7 % — ABNORMAL HIGH (ref 4.6–6.5)

## 2011-09-27 LAB — TSH: TSH: 0.77 u[IU]/mL (ref 0.35–5.50)

## 2011-09-27 NOTE — Progress Notes (Signed)
Subjective:    Patient ID: Marie Holmes, female    DOB: 1935-09-15, 76 y.o.   MRN: [id number]  HPI 76 yo here for diabetes follow up and to discuss weight loss.  DM-  In February, we decreased dose of Metformin and started  Januvia 100 mg daily. Diarrhea and nausea resolved.   Lab Results  Component Value Date   HGBA1C 6.8* 06/21/2011   Hypoglycemic episodes:None Hyperglycemic episodes:None Feet problems:None Blood Sugars averaging: FBS 60-120 eye exam within last year:yes.   Weight loss- unintentional weight loss over past several months. Wt Readings from Last 3 Encounters:  09/27/11 124 lb (56.246 kg)  06/28/11 129 lb (58.514 kg)  05/02/11 134 lb 6.4 oz (60.963 kg)   Denies feeling full early.  No n/v/d. Colonoscopy last year.  She is a long term smoker and has had a chronic dry cough which she feels is getting worse.  Denies any symptoms of anxiety or depression.  Patient Active Problem List  Diagnosis  . COLONIC POLYPS  . DIABETES MELLITUS, TYPE II  . HYPERLIPIDEMIA  . OBESITY, NOS  . ANEMIA, IRON DEFICIENCY, UNSPEC.  Marland Kitchen ANXIETY  . HYPERTENSION  . VENOUS INSUFFICIENCY, CHRONIC  . COPD  . GASTROESOPHAGEAL REFLUX, NO ESOPHAGITIS  . IRRITABLE BOWEL SYNDROME  . OSTEOARTHRITIS  . OSTEOARTHRITIS, MULTI SITES  . TRIGGER FINGER  . Osteoporosis, unspecified  . INSOMNIA NOS  . INCONTINENCE, URGE  . Dysuria   Past Medical History  Diagnosis Date  . Benign tumor of breast 2004    left  . Colon polyp   . Broken ribs     history of several broken ribs on right and compression  fx  . Fibroma of skin     Multiple fibromas/lipomas on arms and legs B  . Pyelonephritis   . Anxiety   . COPD (chronic obstructive pulmonary disease)   . Hyperlipidemia   . Osteoarthritis   . History of CT scan of abdomen 05/23/2003    Mild fatty liver inf, tiny hypodensity in right lobe of liver  . Kidney stone 02/21/1981    Right/removed   Past Surgical History  Procedure Date   . Appendectomy 02/21/1961  . Exploratory laparotomy 02/21/1970    for ? adhesions  . Abdominal hysterectomy 02/21/1970    right ovary remaining   History  Substance Use Topics  . Smoking status: Former Games developer  . Smokeless tobacco: Never Used   Comment: 3/4 pack X 50 years  . Alcohol Use: No   Family History  Problem Relation Age of Onset  . Parkinsonism Brother    Allergies  Allergen Reactions  . Sulfonamide Derivatives     REACTION: Rash and swelling   Current Outpatient Prescriptions on File Prior to Visit  Medication Sig Dispense Refill  . ALPRAZolam (XANAX) 0.5 MG tablet Take 1 tablet (0.5 mg total) by mouth at bedtime as needed. For insomnia  30 tablet  0  . aspirin 81 MG tablet Take 81 mg by mouth daily.        . citalopram (CELEXA) 40 MG tablet Take 1 tablet (40 mg total) by mouth daily.  90 tablet  3  . fluticasone (FLONASE) 50 MCG/ACT nasal spray Place 2 sprays into the nose daily.  16 g  2  . losartan (COZAAR) 50 MG tablet Take 1 tablet (50 mg total) by mouth daily.  90 tablet  3  . metFORMIN (GLUCOPHAGE-XR) 500 MG 24 hr tablet Take 1 tablet (500 mg total) by mouth daily  with breakfast.  90 tablet  3  . metoprolol succinate (TOPROL-XL) 100 MG 24 hr tablet Take 1 tablet (100 mg total) by mouth daily.  90 tablet  3  . Multiple Vitamin (MULTIVITAMIN) tablet Take 1 tablet by mouth daily.        Marland Kitchen omeprazole (PRILOSEC) 20 MG capsule Take 2 capsules (40 mg total) by mouth daily.  90 capsule  3  . sitaGLIPtin (JANUVIA) 100 MG tablet Take 1 tablet (100 mg total) by mouth daily.  30 tablet  6   The PMH, PSH, Social History, Family History, Medications, and allergies have been reviewed in [facility], and have been updated if relevant.   Review of Systems  See HPI No fevers No night sweats She does endorse fatigue No blood in stool No changes in bowel habits     Objective:   Physical Exam  BP 140/68  Pulse 60  Temp 97.4 F (36.3 C)  Wt 124 lb (56.246 kg) Wt Readings  from Last 3 Encounters:  09/27/11 124 lb (56.246 kg)  06/28/11 129 lb (58.514 kg)  05/02/11 134 lb 6.4 oz (60.963 kg)    Constitutional: Vital signs are normal. She appears well-developed and well-nourished. She is cooperative.  Non-toxic appearance. She does not appear ill. No distress.  HENT:  Head: Normocephalic.  Cardiovascular: Normal rate, regular rhythm, S1 normal, S2 normal, normal heart sounds, intact distal pulses and normal pulses.  Exam reveals no gallop and no friction rub.   No murmur heard. Pulmonary/Chest: Effort normal and breath sounds normal. Not tachypneic. No respiratory distress. She has no decreased breath sounds. She has no wheezes. She has no rhonchi. She has no rales.  Abdominal: Soft. Normal appearance and bowel sounds are normal. There is no tenderness.  Neurological: She is alert.  Skin: Skin is warm, dry and intact. No rash noted.  Psychiatric: Her speech is normal and behavior is normal. Judgment and thought content normal. Her mood appears not anxious. Cognition and memory are normal. She does not exhibit a depressed mood.      Assessment & Plan:   1. DIABETES MELLITUS, TYPE II  Stable, continue current meds. Recheck a1c today. Hemoglobin A1c  2. Weight loss  New with unclear etiology. Will check labs today. I am concerned for lung malignancy given long term tobacco use and worsening cough. Will check CXR.  If neg, proceed with CT of chest. The patient indicates understanding of these issues and agrees with the plan.  Comprehensive metabolic panel, TSH, CBC with Differential  3. Cough  See above. DG Chest 2 View

## 2011-09-27 NOTE — Patient Instructions (Addendum)
Good to see you. We will call you with you xray and lab results, probably tomorrow.

## 2011-09-28 ENCOUNTER — Ambulatory Visit: Payer: MEDICARE | Admitting: Family Medicine

## 2011-09-29 ENCOUNTER — Ambulatory Visit: Payer: MEDICARE | Admitting: Family Medicine

## 2011-09-30 ENCOUNTER — Ambulatory Visit (INDEPENDENT_AMBULATORY_CARE_PROVIDER_SITE_OTHER)
Admission: RE | Admit: 2011-09-30 | Discharge: 2011-09-30 | Disposition: A | Discharge status: Home / Self Care | Payer: MEDICARE | Source: Ambulatory Visit | Attending: Family Medicine | Admitting: Family Medicine

## 2011-09-30 DIAGNOSIS — R918 Other nonspecific abnormal finding of lung field: Secondary | ICD-10-CM

## 2011-09-30 DIAGNOSIS — R9389 Abnormal findings on diagnostic imaging of other specified body structures: Secondary | ICD-10-CM

## 2011-09-30 DIAGNOSIS — R05 Cough: Secondary | ICD-10-CM

## 2011-09-30 MED ORDER — IOHEXOL 300 MG/ML  SOLN
80.0000 mL | Freq: Once | INTRAMUSCULAR | Status: AC | PRN
  Administered 2011-09-30: 80 mL via INTRAVENOUS

## 2011-10-03 ENCOUNTER — Other Ambulatory Visit: Payer: Self-pay | Admitting: Family Medicine

## 2011-10-03 DIAGNOSIS — R911 Solitary pulmonary nodule: Secondary | ICD-10-CM

## 2011-10-03 DIAGNOSIS — J209 Acute bronchitis, unspecified: Secondary | ICD-10-CM

## 2011-10-03 MED ORDER — AZITHROMYCIN 250 MG PO TABS: ORAL_TABLET | ORAL | Status: AC

## 2011-10-06 ENCOUNTER — Ambulatory Visit (INDEPENDENT_AMBULATORY_CARE_PROVIDER_SITE_OTHER): Payer: MEDICARE | Admitting: Family Medicine

## 2011-10-06 ENCOUNTER — Encounter: Payer: Self-pay | Admitting: Family Medicine

## 2011-10-06 VITALS — BP 130/60 | HR 72 | Temp 97.9°F | Wt 122.0 lb

## 2011-10-06 DIAGNOSIS — J209 Acute bronchitis, unspecified: Secondary | ICD-10-CM

## 2011-10-06 MED ORDER — ALBUTEROL SULFATE HFA 108 (90 BASE) MCG/ACT IN AERS: 2.0000 | INHALATION_SPRAY | Freq: Four times a day (QID) | RESPIRATORY_TRACT | Status: DC | PRN

## 2011-10-06 MED ORDER — DEXAMETHASONE SOD PHOSPHATE PF 10 MG/ML IJ SOLN
10.0000 mg | Freq: Once | INTRAMUSCULAR | Status: AC
  Administered 2011-10-06: 10 mg via INTRAMUSCULAR

## 2011-10-06 MED ORDER — FLUTICASONE PROPIONATE HFA 44 MCG/ACT IN AERO: 1.0000 | INHALATION_SPRAY | Freq: Two times a day (BID) | RESPIRATORY_TRACT | Status: DC

## 2011-10-06 NOTE — Addendum Note (Signed)
Addended by: Eliezer Bottom on: 10/06/2011 01:52 PM   Modules accepted: Orders

## 2011-10-06 NOTE — Patient Instructions (Addendum)
Start Flovent. Use rescue inhaler.  Finish Zpack. Call me if no improvement over next several days.

## 2011-10-06 NOTE — Progress Notes (Signed)
Marie Holmes is a 76 y.o. very pleasant female patient with COPD who presents with the following:  2 weeks of cough and wheezing. Started Zpack on Monday - last pill tomorrow. Ran out of her albuterol inhaler.  Does not use an inhaled steroid.  Cough is productve. Having some chills, but no fever right now. Having trouble keeping warm.  Wants to stay in bed. Coughing all the time. Generally feels terrible. No global myalgias or arthralgias  Patient Active Problem List  Diagnosis  . COLONIC POLYPS  . DIABETES MELLITUS, TYPE II  . HYPERLIPIDEMIA  . OBESITY, NOS  . ANEMIA, IRON DEFICIENCY, UNSPEC.  Marland Kitchen ANXIETY  . HYPERTENSION  . VENOUS INSUFFICIENCY, CHRONIC  . COPD  . GASTROESOPHAGEAL REFLUX, NO ESOPHAGITIS  . IRRITABLE BOWEL SYNDROME  . OSTEOARTHRITIS  . OSTEOARTHRITIS, MULTI SITES  . TRIGGER FINGER  . Osteoporosis, unspecified  . INSOMNIA NOS  . INCONTINENCE, URGE  . Dysuria  . Weight loss   Past Medical History  Diagnosis Date  . Benign tumor of breast 2004    left  . Colon polyp   . Broken ribs     history of several broken ribs on right and compression  fx  . Fibroma of skin     Multiple fibromas/lipomas on arms and legs B  . Pyelonephritis   . Anxiety   . COPD (chronic obstructive pulmonary disease)   . Hyperlipidemia   . Osteoarthritis   . History of CT scan of abdomen 05/23/2003    Mild fatty liver inf, tiny hypodensity in right lobe of liver  . Kidney stone 02/21/1981    Right/removed   Past Surgical History  Procedure Date  . Appendectomy 02/21/1961  . Exploratory laparotomy 02/21/1970    for ? adhesions  . Abdominal hysterectomy 02/21/1970    right ovary remaining   History  Substance Use Topics  . Smoking status: Former Games developer  . Smokeless tobacco: Never Used   Comment: 3/4 pack X 50 years  . Alcohol Use: No   Family History  Problem Relation Age of Onset  . Parkinsonism Brother    Allergies  Allergen Reactions  . Sulfonamide  Derivatives     REACTION: Rash and swelling   Current Outpatient Prescriptions on File Prior to Visit  Medication Sig Dispense Refill  . ALPRAZolam (XANAX) 0.5 MG tablet Take 1 tablet (0.5 mg total) by mouth at bedtime as needed. For insomnia  30 tablet  0  . aspirin 81 MG tablet Take 81 mg by mouth daily.        Marland Kitchen azithromycin (ZITHROMAX Z-PAK) 250 MG tablet Take 2 tablets (500 mg) on  Day 1,  followed by 1 tablet (250 mg) once daily on Days 2 through 5.  6 each  0  . citalopram (CELEXA) 40 MG tablet Take 1 tablet (40 mg total) by mouth daily.  90 tablet  3  . fluticasone (FLONASE) 50 MCG/ACT nasal spray Place 2 sprays into the nose daily.  16 g  2  . losartan (COZAAR) 50 MG tablet Take 1 tablet (50 mg total) by mouth daily.  90 tablet  3  . metFORMIN (GLUCOPHAGE-XR) 500 MG 24 hr tablet Take 1 tablet (500 mg total) by mouth daily with breakfast.  90 tablet  3  . metoprolol succinate (TOPROL-XL) 100 MG 24 hr tablet Take 1 tablet (100 mg total) by mouth daily.  90 tablet  3  . Multiple Vitamin (MULTIVITAMIN) tablet Take 1 tablet by mouth  daily.        . omeprazole (PRILOSEC) 20 MG capsule Take 2 capsules (40 mg total) by mouth daily.  90 capsule  3  . sitaGLIPtin (JANUVIA) 100 MG tablet Take 1 tablet (100 mg total) by mouth daily.  30 tablet  6  . fluticasone (FLOVENT HFA) 44 MCG/ACT inhaler Inhale 1 puff into the lungs 2 (two) times daily.  1 Inhaler  12    Past Medical History, Surgical History, Social History, Family History, and Problem List have been reviewed in EHR and updated if relevant.  Review of Systems: ROS: GEN: Acute illness details above GI: Tolerating PO intake GU: maintaining adequate hydration and urination Pulm: No SOB Interactive and getting along well at home.  Otherwise, ROS is as per the HPI.   Physical Examination: Filed Vitals:   10/06/11 1114  BP: 130/60  Pulse: 72  Temp: 97.9 F (36.6 C)  Weight: 122 lb (55.339 kg)  SpO2: 94%    There is no height  on file to calculate BMI.   GEN: A and O x 3. WDWN. NAD.    ENT: Nose clear, ext NML.  No LAD.  No JVD.  TM's clear. Oropharynx clear.  PULM: Diffuse exp wheezes, no increased WOB, Decreased BS CV: RRR, no M/G/R, No rubs, No JVD.   EXT: warm and well-perfused, No c/c/e. PSYCH: Pleasant and conversant.   Assessment and Plan: 1. Bronchitis, acute  albuterol (PROVENTIL HFA;VENTOLIN HFA) 108 (90 BASE) MCG/ACT inhaler    Bronchitis with COPD flare. Pt refuses nebs treatment in office. Finish zpack. Sample of flovent given and refilled albuterol. Decadron IM given in office. Pt to call on Monday with an update. The patient indicates understanding of these issues and agrees with the plan.

## 2011-12-13 ENCOUNTER — Encounter: Payer: Self-pay | Admitting: Family Medicine

## 2011-12-13 ENCOUNTER — Ambulatory Visit (INDEPENDENT_AMBULATORY_CARE_PROVIDER_SITE_OTHER): Payer: MEDICARE | Admitting: Family Medicine

## 2011-12-13 VITALS — BP 130/66 | HR 68 | Temp 98.0°F | Wt 120.0 lb

## 2011-12-13 DIAGNOSIS — Z23 Encounter for immunization: Secondary | ICD-10-CM

## 2011-12-13 DIAGNOSIS — E119 Type 2 diabetes mellitus without complications: Secondary | ICD-10-CM

## 2011-12-13 DIAGNOSIS — F419 Anxiety disorder, unspecified: Secondary | ICD-10-CM

## 2011-12-13 MED ORDER — METFORMIN HCL 1000 MG PO TABS: 1000.0000 mg | ORAL_TABLET | Freq: Two times a day (BID) | ORAL | Status: DC

## 2011-12-13 MED ORDER — ALPRAZOLAM 0.5 MG PO TABS: 0.5000 mg | ORAL_TABLET | Freq: Every evening | ORAL | Status: DC | PRN

## 2011-12-13 NOTE — Progress Notes (Signed)
Subjective:    Patient ID: Marie Holmes, female    DOB: 1935-07-17, 76 y.o.   MRN: [id number]  HPI 76 yo here for diabetes follow up.  DM-  In February, we decreased dose of Metformin and started  Januvia 100 mg daily because she was having diarrhea and nausea.  Lab Results  Component Value Date   HGBA1C 6.7* 09/27/2011   She recently stopped her  Januvia on her own because it was too expensive. She increased Metformin to 1000 mg twice daily.  No diarrhea but blood sugars are higher- 120s fasting. She is under more stress lately- fighting with her next door neighbors.  CT for follow up left lung lesion( 3 month f/u) scheduled for next month.     Patient Active Problem List  Diagnosis  . COLONIC POLYPS  . DIABETES MELLITUS, TYPE II  . HYPERLIPIDEMIA  . OBESITY, NOS  . ANEMIA, IRON DEFICIENCY, UNSPEC.  Marland Kitchen ANXIETY  . HYPERTENSION  . VENOUS INSUFFICIENCY, CHRONIC  . COPD  . GASTROESOPHAGEAL REFLUX, NO ESOPHAGITIS  . IRRITABLE BOWEL SYNDROME  . OSTEOARTHRITIS  . OSTEOARTHRITIS, MULTI SITES  . TRIGGER FINGER  . Osteoporosis, unspecified  . INSOMNIA NOS  . INCONTINENCE, URGE  . Dysuria  . Weight loss   Past Medical History  Diagnosis Date  . Benign tumor of breast 2004    left  . Colon polyp   . Broken ribs     history of several broken ribs on right and compression  fx  . Fibroma of skin     Multiple fibromas/lipomas on arms and legs B  . Pyelonephritis   . Anxiety   . COPD (chronic obstructive pulmonary disease)   . Hyperlipidemia   . Osteoarthritis   . History of CT scan of abdomen 05/23/2003    Mild fatty liver inf, tiny hypodensity in right lobe of liver  . Kidney stone 02/21/1981    Right/removed   Past Surgical History  Procedure Date  . Appendectomy 02/21/1961  . Exploratory laparotomy 02/21/1970    for ? adhesions  . Abdominal hysterectomy 02/21/1970    right ovary remaining   History  Substance Use Topics  . Smoking status: Former Games developer  .  Smokeless tobacco: Never Used   Comment: 3/4 pack X 50 years  . Alcohol Use: No   Family History  Problem Relation Age of Onset  . Parkinsonism Brother    Allergies  Allergen Reactions  . Sulfonamide Derivatives     REACTION: Rash and swelling   Current Outpatient Prescriptions on File Prior to Visit  Medication Sig Dispense Refill  . albuterol (PROVENTIL HFA;VENTOLIN HFA) 108 (90 BASE) MCG/ACT inhaler Inhale 2 puffs into the lungs every 6 (six) hours as needed for wheezing.  1 Inhaler  3  . ALPRAZolam (XANAX) 0.5 MG tablet Take 1 tablet (0.5 mg total) by mouth at bedtime as needed. For insomnia  30 tablet  0  . aspirin 81 MG tablet Take 81 mg by mouth daily.        . citalopram (CELEXA) 40 MG tablet Take 1 tablet (40 mg total) by mouth daily.  90 tablet  3  . fluticasone (FLONASE) 50 MCG/ACT nasal spray Place 2 sprays into the nose daily.  16 g  2  . fluticasone (FLOVENT HFA) 44 MCG/ACT inhaler Inhale 1 puff into the lungs 2 (two) times daily.  1 Inhaler  12  . losartan (COZAAR) 50 MG tablet Take 1 tablet (50 mg total) by mouth daily.  90 tablet  3  . metFORMIN (GLUCOPHAGE-XR) 500 MG 24 hr tablet Take 1 tablet (500 mg total) by mouth daily with breakfast.  90 tablet  3  . metoprolol succinate (TOPROL-XL) 100 MG 24 hr tablet Take 1 tablet (100 mg total) by mouth daily.  90 tablet  3  . Multiple Vitamin (MULTIVITAMIN) tablet Take 1 tablet by mouth daily.        Marland Kitchen omeprazole (PRILOSEC) 20 MG capsule Take 2 capsules (40 mg total) by mouth daily.  90 capsule  3  . sitaGLIPtin (JANUVIA) 100 MG tablet Take 1 tablet (100 mg total) by mouth daily.  30 tablet  6   The PMH, PSH, Social History, Family History, Medications, and allergies have been reviewed in [facility], and have been updated if relevant.   Review of Systems  See HPI     Objective:   Physical Exam  BP 130/66  Pulse 68  Temp 98 F (36.7 C)  Wt 120 lb (54.432 kg)   Constitutional: Vital signs are normal. She appears  well-developed and well-nourished. She is cooperative.  Non-toxic appearance. She does not appear ill. No distress.  HENT:  Head: Normocephalic.  Cardiovascular: Normal rate, regular rhythm, S1 normal, S2 normal, normal heart sounds, intact distal pulses and normal pulses.  Exam reveals no gallop and no friction rub.   No murmur heard. Pulmonary/Chest: Effort normal and breath sounds normal. Not tachypneic. No respiratory distress. She has no decreased breath sounds. She has no wheezes. She has no rhonchi. She has no rales.  Abdominal: Soft. Normal appearance and bowel sounds are normal. There is no tenderness.  Neurological: She is alert.  Skin: Skin is warm, dry and intact. No rash noted.  Psychiatric: Her speech is normal and behavior is normal. Judgment and thought content normal. Her mood appears not anxious. Cognition and memory are normal. She does not exhibit a depressed mood.      Assessment & Plan:   1. DIABETES MELLITUS, TYPE II  Deteriorated, likely due to stress. >25 min spent with face to face with patient, >50% counseling and/or coordinating care Advised that next step would be add insulin since she was intolerant of other oral meds including glipizide. She is very reluctant to start insulin.  Will recheck a1c today.  She hopes to have a resolution with her neighbor soon. The patient indicates understanding of these issues and agrees with the plan.  Hemoglobin A1c

## 2011-12-13 NOTE — Patient Instructions (Addendum)
Good to see you. I will call you with your a1c results.  Continue Metformin 1000 mg twice daily.

## 2011-12-20 ENCOUNTER — Telehealth: Payer: Self-pay | Admitting: *Deleted

## 2011-12-20 NOTE — Telephone Encounter (Signed)
Script for alprazolam printed on 12/13/11 called to [facility].

## 2012-01-03 ENCOUNTER — Ambulatory Visit (INDEPENDENT_AMBULATORY_CARE_PROVIDER_SITE_OTHER)
Admission: RE | Admit: 2012-01-03 | Discharge: 2012-01-03 | Disposition: A | Discharge status: Home / Self Care | Payer: MEDICARE | Source: Ambulatory Visit | Attending: Family Medicine | Admitting: Family Medicine

## 2012-01-03 DIAGNOSIS — R911 Solitary pulmonary nodule: Secondary | ICD-10-CM

## 2012-01-03 DIAGNOSIS — J984 Other disorders of lung: Secondary | ICD-10-CM

## 2012-01-04 ENCOUNTER — Other Ambulatory Visit: Payer: Self-pay | Admitting: Family Medicine

## 2012-01-04 DIAGNOSIS — R918 Other nonspecific abnormal finding of lung field: Secondary | ICD-10-CM

## 2012-01-04 DIAGNOSIS — R911 Solitary pulmonary nodule: Secondary | ICD-10-CM

## 2012-01-13 ENCOUNTER — Encounter: Payer: MEDICARE | Admitting: Cardiothoracic Surgery

## 2012-01-13 ENCOUNTER — Other Ambulatory Visit: Payer: Self-pay | Admitting: *Deleted

## 2012-01-13 MED ORDER — CITALOPRAM HYDROBROMIDE 40 MG PO TABS: 40.0000 mg | ORAL_TABLET | Freq: Every day | ORAL | Status: DC

## 2012-01-13 MED ORDER — LOSARTAN POTASSIUM 50 MG PO TABS: 50.0000 mg | ORAL_TABLET | Freq: Every day | ORAL | Status: DC

## 2012-01-13 MED ORDER — METOPROLOL SUCCINATE ER 100 MG PO TB24: 100.0000 mg | ORAL_TABLET | Freq: Every day | ORAL | Status: DC

## 2012-01-16 ENCOUNTER — Other Ambulatory Visit: Payer: Self-pay | Admitting: Cardiothoracic Surgery

## 2012-01-16 ENCOUNTER — Encounter: Payer: Self-pay | Admitting: Cardiothoracic Surgery

## 2012-01-16 ENCOUNTER — Institutional Professional Consult (permissible substitution) (INDEPENDENT_AMBULATORY_CARE_PROVIDER_SITE_OTHER): Payer: MEDICARE | Admitting: Cardiothoracic Surgery

## 2012-01-16 VITALS — BP 118/65 | HR 80 | Resp 20 | Ht 61.0 in | Wt 120.0 lb

## 2012-01-16 DIAGNOSIS — D381 Neoplasm of uncertain behavior of trachea, bronchus and lung: Secondary | ICD-10-CM

## 2012-01-16 DIAGNOSIS — R51 Headache: Secondary | ICD-10-CM

## 2012-01-16 DIAGNOSIS — R918 Other nonspecific abnormal finding of lung field: Secondary | ICD-10-CM

## 2012-01-16 DIAGNOSIS — C7931 Secondary malignant neoplasm of brain: Secondary | ICD-10-CM

## 2012-01-16 NOTE — Progress Notes (Signed)
PCP is Ruthe Mannan, MD Referring Provider is Dianne Dun, MD  Chief Complaint  Patient presents with  . Lung Lesion    Referral from Dr Dayton Martes for surgical eval on lung nodules, Chest CT    HPI: I was asked to evaluate this 76 year old Caucasian female reformed but heavy smoker with a left lower lobe infiltrated density which has increased on CT scan from August to November and is associated with a significant weight loss and some associated back pain. In August the area concern was in the spear aspect of the lower lobe. Peripherally measuring approximately 8 mm. Measurements in Novembe of the ground glass density r are approximately 1.5 cm and there is concern this could represent a bronchogenic carcinoma, possible adenocarcinoma. There is no significant enlargement of the mediastinal nodes. The patient is stop smoking for one to 2 years. She denies family history of lung cancer. She denies history of significant thoracic trauma   Past Medical History  Diagnosis Date  . Benign tumor of breast 2004    left  . Colon polyp   . Broken ribs     history of several broken ribs on right and compression  fx  . Fibroma of skin     Multiple fibromas/lipomas on arms and legs B  . Pyelonephritis   . Anxiety   . COPD (chronic obstructive pulmonary disease)   . Hyperlipidemia   . Osteoarthritis   . History of CT scan of abdomen 05/23/2003    Mild fatty liver inf, tiny hypodensity in right lobe of liver  . Kidney stone 02/21/1981    Right/removed    Past Surgical History  Procedure Date  . Appendectomy 02/21/1961  . Exploratory laparotomy 02/21/1970    for ? adhesions  . Abdominal hysterectomy 02/21/1970    right ovary remaining    Family History  Problem Relation Age of Onset  . Parkinsonism Brother     Social History History  Substance Use Topics  . Smoking status: Former Games developer  . Smokeless tobacco: Never Used     Comment: 3/4 pack X 50 years  . Alcohol Use: No    Current  Outpatient Prescriptions  Medication Sig Dispense Refill  . albuterol (PROVENTIL HFA;VENTOLIN HFA) 108 (90 BASE) MCG/ACT inhaler Inhale 2 puffs into the lungs every 6 (six) hours as needed for wheezing.  1 Inhaler  3  . ALPRAZolam (XANAX) 0.5 MG tablet Take 1 tablet (0.5 mg total) by mouth at bedtime as needed. For insomnia  30 tablet  0  . aspirin 81 MG tablet Take 81 mg by mouth daily.        . citalopram (CELEXA) 40 MG tablet Take 1 tablet (40 mg total) by mouth daily.  90 tablet  0  . losartan (COZAAR) 50 MG tablet Take 1 tablet (50 mg total) by mouth daily.  90 tablet  1  . metFORMIN (GLUCOPHAGE) 1000 MG tablet Take 1 tablet (1,000 mg total) by mouth 2 (two) times daily with a meal.  180 tablet  3  . metoprolol succinate (TOPROL-XL) 100 MG 24 hr tablet Take 1 tablet (100 mg total) by mouth daily.  90 tablet  1  . Multiple Vitamin (MULTIVITAMIN) tablet Take 1 tablet by mouth daily.          Allergies  Allergen Reactions  . Sulfonamide Derivatives     REACTION: Rash and swelling    Review of Systems Positive for significant weight loss with her  Dress size decreasing from size  12 to size 6 or 8. No night sweats HEENT review negative for difficulty swallowing, positive dental plates Thorax positive for the ground glass density increasing in size in the superior aspect of the left lower lobe, peripherally Cardiac no history of angina or murmur or arrhythmia GI history of laparotomy x2 once for gastric abnormality and a second time for lysis of adhesions Endocrine positive for diabetes Vascular negative for DVT claudication or TIA Neurologic negative for stroke, no recent head CT scan BP 118/65  Pulse 80  Resp 20  Ht 5\' 1"  (1.549 m)  Wt 120 lb (54.432 kg)  BMI 22.67 kg/m2  SpO2 92% Physical Exam General appearance very nice 76 year old female no acute distress HEENT normocephalic pupils equal Neck without palpable adenopathy JVD or bruit Thorax without deformity or tenderness  breath sounds diminished bilaterally Cardiac regular rhythm without murmur or gallop Abdomen soft nontender well-healed lower surgical scar Extremities without edema tenderness or clubbing Vascular positive pulses in all extremities no venous insufficiency Neurologic alert and oriented no focal motor deficit normal gait Diagnostic Tests:   Impression: 76 year old female with fairly significant emphysema noted on CT scan associated with a resting O2 sat of 92% and symptomatic dyspnea on exertion within a large ground glass density in the left lower lobe on serial CT scans from August to November 2013. This certainly could represent an early adenocarcinoma and we'll proceed with a PET scan. Without significant adenopathy it is hard to explain her severe weight loss on the basis of this radiographic finding.   Plan: PET scan, brain MRI or clinical staging as well as pulmonary functio  testing to assess operability. Return to clinic to review studies.

## 2012-01-17 LAB — CREATININE, SERUM: Creat: 0.73 mg/dL (ref 0.50–1.10)

## 2012-01-17 LAB — BUN: BUN: 11 mg/dL (ref 6–23)

## 2012-01-27 ENCOUNTER — Encounter (HOSPITAL_COMMUNITY)
Admission: RE | Admit: 2012-01-27 | Discharge: 2012-01-27 | Disposition: A | Discharge status: Home / Self Care | Payer: Medicare Other | Source: Ambulatory Visit | Attending: Cardiothoracic Surgery | Admitting: Cardiothoracic Surgery

## 2012-01-27 ENCOUNTER — Ambulatory Visit (HOSPITAL_COMMUNITY)
Admission: RE | Admit: 2012-01-27 | Discharge: 2012-01-27 | Disposition: A | Discharge status: Home / Self Care | Payer: Medicare Other | Source: Ambulatory Visit | Attending: Cardiothoracic Surgery | Admitting: Cardiothoracic Surgery

## 2012-01-27 ENCOUNTER — Encounter (HOSPITAL_COMMUNITY): Payer: MEDICARE

## 2012-01-27 ENCOUNTER — Encounter (HOSPITAL_COMMUNITY): Payer: Self-pay

## 2012-01-27 DIAGNOSIS — J449 Chronic obstructive pulmonary disease, unspecified: Secondary | ICD-10-CM | POA: Insufficient documentation

## 2012-01-27 DIAGNOSIS — R062 Wheezing: Secondary | ICD-10-CM | POA: Insufficient documentation

## 2012-01-27 DIAGNOSIS — J984 Other disorders of lung: Secondary | ICD-10-CM | POA: Insufficient documentation

## 2012-01-27 DIAGNOSIS — D381 Neoplasm of uncertain behavior of trachea, bronchus and lung: Secondary | ICD-10-CM

## 2012-01-27 DIAGNOSIS — C7931 Secondary malignant neoplasm of brain: Secondary | ICD-10-CM

## 2012-01-27 DIAGNOSIS — R059 Cough, unspecified: Secondary | ICD-10-CM | POA: Insufficient documentation

## 2012-01-27 DIAGNOSIS — J4489 Other specified chronic obstructive pulmonary disease: Secondary | ICD-10-CM | POA: Insufficient documentation

## 2012-01-27 DIAGNOSIS — R51 Headache: Secondary | ICD-10-CM

## 2012-01-27 DIAGNOSIS — R05 Cough: Secondary | ICD-10-CM | POA: Insufficient documentation

## 2012-01-27 DIAGNOSIS — J988 Other specified respiratory disorders: Secondary | ICD-10-CM | POA: Insufficient documentation

## 2012-01-27 DIAGNOSIS — R0989 Other specified symptoms and signs involving the circulatory and respiratory systems: Secondary | ICD-10-CM | POA: Insufficient documentation

## 2012-01-27 DIAGNOSIS — R0609 Other forms of dyspnea: Secondary | ICD-10-CM | POA: Insufficient documentation

## 2012-01-27 HISTORY — DX: Other nonspecific abnormal finding of lung field: R91.8

## 2012-01-27 LAB — PULMONARY FUNCTION TEST

## 2012-01-27 LAB — GLUCOSE, CAPILLARY: Glucose-Capillary: 140 mg/dL — ABNORMAL HIGH (ref 70–99)

## 2012-01-27 MED ORDER — GADOBENATE DIMEGLUMINE 529 MG/ML IV SOLN
10.0000 mL | Freq: Once | INTRAVENOUS | Status: AC | PRN
  Administered 2012-01-27: 10 mL via INTRAVENOUS

## 2012-01-27 MED ORDER — FLUDEOXYGLUCOSE F - 18 (FDG) INJECTION
16.4000 | Freq: Once | INTRAVENOUS | Status: AC | PRN
  Administered 2012-01-27: 16.4 via INTRAVENOUS

## 2012-01-27 MED ORDER — ALBUTEROL SULFATE (5 MG/ML) 0.5% IN NEBU
2.5000 mg | INHALATION_SOLUTION | Freq: Once | RESPIRATORY_TRACT | Status: AC
  Administered 2012-01-27: 2.5 mg via RESPIRATORY_TRACT

## 2012-02-01 ENCOUNTER — Other Ambulatory Visit: Payer: Self-pay | Admitting: Cardiothoracic Surgery

## 2012-02-01 ENCOUNTER — Ambulatory Visit (INDEPENDENT_AMBULATORY_CARE_PROVIDER_SITE_OTHER): Payer: Medicare Other | Admitting: Cardiothoracic Surgery

## 2012-02-01 ENCOUNTER — Encounter: Payer: Self-pay | Admitting: Cardiothoracic Surgery

## 2012-02-01 VITALS — BP 107/64 | HR 77 | Resp 18 | Ht 61.0 in | Wt 120.0 lb

## 2012-02-01 DIAGNOSIS — D381 Neoplasm of uncertain behavior of trachea, bronchus and lung: Secondary | ICD-10-CM

## 2012-02-01 DIAGNOSIS — R918 Other nonspecific abnormal finding of lung field: Secondary | ICD-10-CM

## 2012-02-01 NOTE — Progress Notes (Signed)
PCP is Ruthe Mannan, MD Referring Provider is Dianne Dun, MD  Chief Complaint  Patient presents with  . Lung Lesion    S/P PET Scan, MRI Brain and PFT's on 01/27/12    HPI: 2.4 cm nodular density in the periphery of the left lower lobe and a patient with heavy smoking history Increased metabolic activity of the density 2.4 SUV no other abnormal uptake on PET scan Brain MRI negative for metastatic disease PFTs show significant COPD with FEV1 of 680 cc, low diffusion capacity as well Probable early stage lung cancer, possible adenocarcinoma  Past Medical History  Diagnosis Date  . Benign tumor of breast 2004    left  . Colon polyp   . Broken ribs     history of several broken ribs on right and compression  fx  . Fibroma of skin     Multiple fibromas/lipomas on arms and legs B  . Pyelonephritis   . Anxiety   . COPD (chronic obstructive pulmonary disease)   . Hyperlipidemia   . Osteoarthritis   . History of CT scan of abdomen 05/23/2003    Mild fatty liver inf, tiny hypodensity in right lobe of liver  . Kidney stone 02/21/1981    Right/removed  . Pulmonary nodules     Past Surgical History  Procedure Date  . Appendectomy 02/21/1961  . Exploratory laparotomy 02/21/1970    for ? adhesions  . Abdominal hysterectomy 02/21/1970    right ovary remaining    Family History  Problem Relation Age of Onset  . Parkinsonism Brother     Social History History  Substance Use Topics  . Smoking status: Former Games developer  . Smokeless tobacco: Never Used     Comment: 3/4 pack X 50 years  . Alcohol Use: No    Current Outpatient Prescriptions  Medication Sig Dispense Refill  . albuterol (PROVENTIL HFA;VENTOLIN HFA) 108 (90 BASE) MCG/ACT inhaler Inhale 2 puffs into the lungs every 6 (six) hours as needed for wheezing.  1 Inhaler  3  . ALPRAZolam (XANAX) 0.5 MG tablet Take 1 tablet (0.5 mg total) by mouth at bedtime as needed. For insomnia  30 tablet  0  . aspirin 81 MG tablet Take 81  mg by mouth daily.        . citalopram (CELEXA) 40 MG tablet Take 1 tablet (40 mg total) by mouth daily.  90 tablet  0  . losartan (COZAAR) 50 MG tablet Take 1 tablet (50 mg total) by mouth daily.  90 tablet  1  . metFORMIN (GLUCOPHAGE) 1000 MG tablet Take 1 tablet (1,000 mg total) by mouth 2 (two) times daily with a meal.  180 tablet  3  . metoprolol succinate (TOPROL-XL) 100 MG 24 hr tablet Take 1 tablet (100 mg total) by mouth daily.  90 tablet  1  . Multiple Vitamin (MULTIVITAMIN) tablet Take 1 tablet by mouth daily.          Allergies  Allergen Reactions  . Sulfonamide Derivatives     REACTION: Rash and swelling    Review of Systems runny nose and cough no further weight loss no fever  BP 107/64  Pulse 77  Resp 18  Ht 5\' 1"  (1.549 m)  Wt 120 lb (54.432 kg)  BMI 22.67 kg/m2  SpO2 94% Physical Exam Lungs clear No adenopathy the neck No edema Neuro intact  Diagnostic Tests: Results of PET scan and brain MRI and PFTs reviewed with patient and family  Impression: Probable early  stage lung cancer left lower lobe. Patient poor candidate for surgical resection 22 severe COPD, FEV1 680 cc  Plan: Needle biopsy of density. If positive will probably recommend SB RT

## 2012-02-02 ENCOUNTER — Other Ambulatory Visit: Payer: Self-pay | Admitting: Radiology

## 2012-02-02 ENCOUNTER — Encounter (HOSPITAL_COMMUNITY): Payer: Self-pay | Admitting: Pharmacist

## 2012-02-06 ENCOUNTER — Ambulatory Visit (HOSPITAL_COMMUNITY)
Admission: RE | Admit: 2012-02-06 | Discharge: 2012-02-06 | Disposition: A | Discharge status: Home / Self Care | Payer: Medicare Other | Source: Ambulatory Visit | Attending: Diagnostic Radiology | Admitting: Diagnostic Radiology

## 2012-02-06 ENCOUNTER — Ambulatory Visit (HOSPITAL_COMMUNITY)
Admission: RE | Admit: 2012-02-06 | Discharge: 2012-02-06 | Disposition: A | Discharge status: Home / Self Care | Payer: Medicare Other | Source: Ambulatory Visit | Attending: Cardiothoracic Surgery | Admitting: Cardiothoracic Surgery

## 2012-02-06 ENCOUNTER — Ambulatory Visit (HOSPITAL_COMMUNITY): Admission: RE | Admit: 2012-02-06 | Payer: Medicare Other | Source: Ambulatory Visit

## 2012-02-06 DIAGNOSIS — D381 Neoplasm of uncertain behavior of trachea, bronchus and lung: Secondary | ICD-10-CM | POA: Insufficient documentation

## 2012-02-06 DIAGNOSIS — J9819 Other pulmonary collapse: Secondary | ICD-10-CM | POA: Insufficient documentation

## 2012-02-06 HISTORY — PX: OTHER SURGICAL HISTORY: SHX169

## 2012-02-06 LAB — CBC
HCT: 40.7 % (ref 36.0–46.0)
Platelets: 278 10*3/uL (ref 150–400)
RBC: 4.48 MIL/uL (ref 3.87–5.11)
RDW: 13.3 % (ref 11.5–15.5)
WBC: 7.6 10*3/uL (ref 4.0–10.5)

## 2012-02-06 LAB — PROTIME-INR: INR: 0.95 (ref 0.00–1.49)

## 2012-02-06 LAB — GLUCOSE, CAPILLARY
Glucose-Capillary: 133 mg/dL — ABNORMAL HIGH (ref 70–99)
Glucose-Capillary: 118 mg/dL — ABNORMAL HIGH (ref 70–99)

## 2012-02-06 LAB — APTT: aPTT: 34 seconds (ref 24–37)

## 2012-02-06 MED ORDER — HYDROCODONE-ACETAMINOPHEN 5-325 MG PO TABS
1.0000 | ORAL_TABLET | ORAL | Status: DC | PRN
  Filled 2012-02-06: qty 2

## 2012-02-06 MED ORDER — MIDAZOLAM HCL 2 MG/2ML IJ SOLN
INTRAMUSCULAR | Status: AC | PRN
  Administered 2012-02-06: 0.5 mg via INTRAVENOUS
  Administered 2012-02-06: 1 mg via INTRAVENOUS

## 2012-02-06 MED ORDER — MIDAZOLAM HCL 2 MG/2ML IJ SOLN
INTRAMUSCULAR | Status: AC
  Filled 2012-02-06: qty 4

## 2012-02-06 MED ORDER — SODIUM CHLORIDE 0.9 % IV SOLN
INTRAVENOUS | Status: DC
  Administered 2012-02-06: 08:00:00 via INTRAVENOUS

## 2012-02-06 MED ORDER — FENTANYL CITRATE 0.05 MG/ML IJ SOLN
INTRAMUSCULAR | Status: AC | PRN
  Administered 2012-02-06: 50 ug via INTRAVENOUS
  Administered 2012-02-06: 25 ug via INTRAVENOUS

## 2012-02-06 MED ORDER — FENTANYL CITRATE 0.05 MG/ML IJ SOLN
INTRAMUSCULAR | Status: AC
  Filled 2012-02-06: qty 4

## 2012-02-06 NOTE — H&P (Signed)
Marie Holmes is an 76 y.o. female.   Chief Complaint: weight loss, lung nodule. Presents today for needle biopsy.  HPI: see pulmonary note below :  PCP is Ruthe Mannan, MD Referring Provider is Dianne Dun, MD    Chief Complaint   Patient presents with   .  Lung Lesion       S/P PET Scan, MRI Brain and PFT's on 01/27/12     HPI: 2.4 cm nodular density in the periphery of the left lower lobe and a patient with heavy smoking history Increased metabolic activity of the density 2.4 SUV no other abnormal uptake on PET scan Brain MRI negative for metastatic disease PFTs show significant COPD with FEV1 of 680 cc, low diffusion capacity as well Probable early stage lung cancer, possible adenocarcinoma    Past Medical History   Diagnosis  Date   .  Benign tumor of breast  2004       left   .  Colon polyp     .  Broken ribs         history of several broken ribs on right and compression  fx   .  Fibroma of skin         Multiple fibromas/lipomas on arms and legs B   .  Pyelonephritis     .  Anxiety     .  COPD (chronic obstructive pulmonary disease)     .  Hyperlipidemia     .  Osteoarthritis     .  History of CT scan of abdomen  05/23/2003       Mild fatty liver inf, tiny hypodensity in right lobe of liver   .  Kidney stone  02/21/1981       Right/removed   .  Pulmonary nodules         Past Surgical History   Procedure  Date   .  Appendectomy  02/21/1961   .  Exploratory laparotomy  02/21/1970       for ? adhesions   .  Abdominal hysterectomy  02/21/1970       right ovary remaining       Family History   Problem  Relation  Age of Onset   .  Parkinsonism  Brother       Social History History   Substance Use Topics   .  Smoking status:  Former Games developer   .  Smokeless tobacco:  Never Used         Comment: 3/4 pack X 50 years   .  Alcohol Use:  No       Current Outpatient Prescriptions   Medication  Sig  Dispense  Refill   .  albuterol (PROVENTIL HFA;VENTOLIN HFA)  108 (90 BASE) MCG/ACT inhaler  Inhale 2 puffs into the lungs every 6 (six) hours as needed for wheezing.   1 Inhaler   3   .  ALPRAZolam (XANAX) 0.5 MG tablet  Take 1 tablet (0.5 mg total) by mouth at bedtime as needed. For insomnia   30 tablet   0   .  aspirin 81 MG tablet  Take 81 mg by mouth daily.           .  citalopram (CELEXA) 40 MG tablet  Take 1 tablet (40 mg total) by mouth daily.   90 tablet   0   .  losartan (COZAAR) 50 MG tablet  Take 1 tablet (50 mg total) by mouth daily.  90 tablet   1   .  metFORMIN (GLUCOPHAGE) 1000 MG tablet  Take 1 tablet (1,000 mg total) by mouth 2 (two) times daily with a meal.   180 tablet   3   .  metoprolol succinate (TOPROL-XL) 100 MG 24 hr tablet  Take 1 tablet (100 mg total) by mouth daily.   90 tablet   1   .  Multiple Vitamin (MULTIVITAMIN) tablet  Take 1 tablet by mouth daily.               Allergies   Allergen  Reactions   .  Sulfonamide Derivatives         REACTION: Rash and swelling     Review of Systems runny nose and cough no further weight loss no fever  BP 107/64  Pulse 77  Resp 18  Ht 5\' 1"  (1.549 m)  Wt 120 lb (54.432 kg)  BMI 22.67 kg/m2  SpO2 94% Physical Exam Lungs clear No adenopathy the neck No edema Neuro intact  Diagnostic Tests: Results of PET scan and brain MRI and PFTs reviewed with patient and family  Impression: Probable early stage lung cancer left lower lobe. Patient poor candidate for surgical resection 22 severe COPD, FEV1 680 cc  Plan: Needle biopsy of density. If positive will probably recommend SB RT   Today's examination notes :    Past Medical History  Diagnosis Date  . Benign tumor of breast 2004    left  . Colon polyp   . Broken ribs     history of several broken ribs on right and compression  fx  . Fibroma of skin     Multiple fibromas/lipomas on arms and legs B  . Pyelonephritis   . Anxiety   . COPD (chronic obstructive pulmonary disease)   . Hyperlipidemia   . Osteoarthritis    . History of CT scan of abdomen 05/23/2003    Mild fatty liver inf, tiny hypodensity in right lobe of liver  . Kidney stone 02/21/1981    Right/removed  . Pulmonary nodules     Past Surgical History  Procedure Date  . Appendectomy 02/21/1961  . Exploratory laparotomy 02/21/1970    for ? adhesions  . Abdominal hysterectomy 02/21/1970    right ovary remaining    Family History  Problem Relation Age of Onset  . Parkinsonism Brother    Social History:  reports that she has quit smoking. She has never used smokeless tobacco. She reports that she does not drink alcohol. Her drug history not on file.  Allergies:  Allergies  Allergen Reactions  . Sulfonamide Derivatives Swelling and Rash    Around the mouth swelling     Medication List     As of 02/06/2012  8:41 AM    ASK your doctor about these medications         albuterol 108 (90 BASE) MCG/ACT inhaler   Commonly known as: PROVENTIL HFA;VENTOLIN HFA   Inhale 2 puffs into the lungs every 6 (six) hours as needed for wheezing.      ALPRAZolam 0.5 MG tablet   Commonly known as: XANAX   Take 0.5 mg by mouth at bedtime. For insomnia      aspirin EC 81 MG tablet   Take 81 mg by mouth every morning.      citalopram 40 MG tablet   Commonly known as: CELEXA   Take 40 mg by mouth every evening.      ibuprofen 200 MG tablet  Commonly known as: ADVIL,MOTRIN   Take 200 mg by mouth every 8 (eight) hours as needed. For pain      losartan 50 MG tablet   Commonly known as: COZAAR   Take 50 mg by mouth every morning.      metFORMIN 1000 MG tablet   Commonly known as: GLUCOPHAGE   Take 1 tablet (1,000 mg total) by mouth 2 (two) times daily with a meal.      metoprolol succinate 100 MG 24 hr tablet   Commonly known as: TOPROL-XL   Take 100 mg by mouth every morning.      multivitamin tablet   Take 1 tablet by mouth daily at 12 noon.           Results for orders placed during the hospital encounter of 02/06/12 (from  the past 48 hour(s))  CBC     Status: Normal   Collection Time   02/06/12  7:40 AM      Component Value Range Comment   WBC 7.6  4.0 - 10.5 K/uL    RBC 4.48  3.87 - 5.11 MIL/uL    Hemoglobin 13.5  12.0 - 15.0 g/dL    HCT 16.1  09.6 - 04.5 %    MCV 90.8  78.0 - 100.0 fL    MCH 30.1  26.0 - 34.0 pg    MCHC 33.2  30.0 - 36.0 g/dL    RDW 40.9  81.1 - 91.4 %    Platelets 278  150 - 400 K/uL   PROTIME-INR     Status: Normal   Collection Time   02/06/12  7:40 AM      Component Value Range Comment   Prothrombin Time 12.6  11.6 - 15.2 seconds    INR 0.95  0.00 - 1.49   GLUCOSE, CAPILLARY     Status: Abnormal   Collection Time   02/06/12  7:45 AM      Component Value Range Comment   Glucose-Capillary 133 (*) 70 - 99 mg/dL   APTT     Status: Normal   Collection Time   02/06/12  2:28 PM      Component Value Range Comment   aPTT 34  24 - 37 seconds     Review of Systems  Constitutional: Positive for weight loss and malaise/fatigue. Negative for fever and chills.  Eyes: Negative.   Respiratory: Positive for cough and shortness of breath. Negative for hemoptysis, sputum production and wheezing.   Cardiovascular: Negative.   Gastrointestinal: Negative for heartburn, nausea, vomiting and abdominal pain.  Musculoskeletal: Positive for back pain.  Skin: Negative.   Neurological: Negative for speech change, focal weakness, seizures and loss of consciousness.  Endo/Heme/Allergies: Negative.   Psychiatric/Behavioral: Negative for depression and memory loss. The patient has insomnia.     Blood pressure 148/69, pulse 72, temperature 96.2 F (35.7 C), temperature source Oral, resp. rate 18, height 5\' 1"  (1.549 m), weight 119 lb (53.978 kg), SpO2 96.00%. Physical Exam  Constitutional: She is oriented to person, place, and time. She appears well-developed. No distress.  HENT:  Head: Normocephalic and atraumatic.  Eyes: Pupils are equal, round, and reactive to light.  Cardiovascular: Normal  rate, regular rhythm and normal heart sounds.  Exam reveals no gallop and no friction rub.   No murmur heard. Respiratory: Effort normal and breath sounds normal. No respiratory distress. She has no wheezes. She has no rales.       Diminished BS bilaterally but CTA  GI: Soft. Bowel sounds are normal. She exhibits no distension.  Musculoskeletal: Normal range of motion. She exhibits no edema.  Neurological: She is alert and oriented to person, place, and time.  Skin: Skin is warm and dry. She is not diaphoretic.  Psychiatric: She has a normal mood and affect. Her behavior is normal. Judgment and thought content normal.     Assessment/Plan Procedure details for lung biopsy discussed in detail with patient. Potential complications including but not limited to infection, bleeding, pneumo/hemothorax requiring chest tube placement, inadequate specimen and complications with moderate sedation reviewed with the patient's apparent understanding. Written consent obtained.   [place] Searles D 02/06/2012, 8:37 AM

## 2012-02-06 NOTE — ED Notes (Signed)
O2 d/c 

## 2012-02-06 NOTE — Procedures (Signed)
CT guided core biopsies of the vague lesion in the left lower lobe periphery.  2 cores obtained.  No immediate complication.

## 2012-02-07 ENCOUNTER — Telehealth (HOSPITAL_COMMUNITY): Payer: Self-pay | Admitting: *Deleted

## 2012-02-10 ENCOUNTER — Telehealth: Payer: Self-pay | Admitting: Family Medicine

## 2012-02-10 NOTE — Telephone Encounter (Signed)
Dr. Alla German called in ref to pt and wants you to review her pathology report. He says he will call you back either later today or one day next week. Thank you.

## 2012-02-17 NOTE — Telephone Encounter (Signed)
Path report reviewed.

## 2012-02-17 NOTE — Telephone Encounter (Signed)
Noted. Jacki Cones, please call Dr. Sheffield Slider office to find out what plan is for patient- i.e should we refer to oncology or was in the process of doing so?

## 2012-02-17 NOTE — Telephone Encounter (Signed)
Path report reviewed.  Worrisome for malignancy- non hodgkin's b cell lymphoma.  I will await his call but she is following up with me on TuesdayShirlee Limerick, can you find out if she was already referred to oncology?

## 2012-02-17 NOTE — Telephone Encounter (Signed)
Called [facility] and she has not been seen by any of the Oncologists there. She hasnt been referred to Oncology.

## 2012-02-20 NOTE — Telephone Encounter (Signed)
Spoke with Corrie Dandy, nurse with that office.  She advised that her office will be doing oncology referral.

## 2012-02-20 NOTE — Telephone Encounter (Signed)
Noted.  Will forward to [facility].

## 2012-02-21 ENCOUNTER — Ambulatory Visit (INDEPENDENT_AMBULATORY_CARE_PROVIDER_SITE_OTHER): Payer: Medicare Other | Admitting: Family Medicine

## 2012-02-21 ENCOUNTER — Telehealth: Payer: Self-pay | Admitting: Internal Medicine

## 2012-02-21 ENCOUNTER — Encounter: Payer: Self-pay | Admitting: Family Medicine

## 2012-02-21 VITALS — BP 122/60 | HR 72 | Temp 97.6°F | Wt 118.0 lb

## 2012-02-21 DIAGNOSIS — D47Z9 Other specified neoplasms of uncertain behavior of lymphoid, hematopoietic and related tissue: Secondary | ICD-10-CM

## 2012-02-21 DIAGNOSIS — E119 Type 2 diabetes mellitus without complications: Secondary | ICD-10-CM

## 2012-02-21 DIAGNOSIS — D492 Neoplasm of unspecified behavior of bone, soft tissue, and skin: Secondary | ICD-10-CM

## 2012-02-21 MED ORDER — DOXYCYCLINE HYCLATE 100 MG PO CAPS: 100.0000 mg | ORAL_CAPSULE | Freq: Two times a day (BID) | ORAL | Status: DC

## 2012-02-21 NOTE — Telephone Encounter (Signed)
Appt made with Dr Shirline Frees on 02/28/2012 [facility] for patient regarding this appt.

## 2012-02-21 NOTE — Progress Notes (Signed)
Very pleasant 76 year old Caucasian female here for follow up from Dr. Morton Peters.  I referred her to him after she was found to have a left lower lobe infiltrated density which has increased on CT scan from August to November.  This area lit up on PET scan, PET scan otherwise neg.  She has been having significant weight loss and some associated back pain.   Underwent biopsy which showed:  Lymphoid proliferation- worrisome for B cell lymphoma.  Pt received call from his office last night stating that he did not feel it was cancerous and should take antibiotics.  I discussed this with Dr. Morton Peters by phone this morning.  He feels she is poor surgical candidate due to her poor PFTs and the tissue obtained during biopsy was insufficient for accurate cytology.  Since this could be an over read of cancer and since PET scan was otherwise neg, he was planning on repeating CT scan in 3 months.  Pt is worried about waiting 3 months. Continues to have weight loss and back pain.  Wt Readings from Last 3 Encounters:  02/21/12 118 lb (53.524 kg)  02/06/12 119 lb (53.978 kg)  02/01/12 120 lb (54.432 kg)   Patient Active Problem List  Diagnosis  . COLONIC POLYPS  . DIABETES MELLITUS, TYPE II  . HYPERLIPIDEMIA  . OBESITY, NOS  . ANEMIA, IRON DEFICIENCY, UNSPEC.  Marland Kitchen ANXIETY  . HYPERTENSION  . VENOUS INSUFFICIENCY, CHRONIC  . COPD  . GASTROESOPHAGEAL REFLUX, NO ESOPHAGITIS  . IRRITABLE BOWEL SYNDROME  . OSTEOARTHRITIS  . OSTEOARTHRITIS, MULTI SITES  . TRIGGER FINGER  . Osteoporosis, unspecified  . INSOMNIA NOS  . INCONTINENCE, URGE  . Dysuria  . Weight loss   Past Medical History  Diagnosis Date  . Benign tumor of breast 2004    left  . Colon polyp   . Broken ribs     history of several broken ribs on right and compression  fx  . Fibroma of skin     Multiple fibromas/lipomas on arms and legs B  . Pyelonephritis   . Anxiety   . COPD (chronic obstructive pulmonary disease)   .  Hyperlipidemia   . Osteoarthritis   . History of CT scan of abdomen 05/23/2003    Mild fatty liver inf, tiny hypodensity in right lobe of liver  . Kidney stone 02/21/1981    Right/removed  . Pulmonary nodules    Past Surgical History  Procedure Date  . Appendectomy 02/21/1961  . Exploratory laparotomy 02/21/1970    for ? adhesions  . Abdominal hysterectomy 02/21/1970    right ovary remaining   History  Substance Use Topics  . Smoking status: Former Games developer  . Smokeless tobacco: Never Used     Comment: 3/4 pack X 50 years  . Alcohol Use: No   Family History  Problem Relation Age of Onset  . Parkinsonism Brother    Allergies  Allergen Reactions  . Sulfonamide Derivatives Swelling and Rash    Around the mouth swelling   Current Outpatient Prescriptions on File Prior to Visit  Medication Sig Dispense Refill  . albuterol (PROVENTIL HFA;VENTOLIN HFA) 108 (90 BASE) MCG/ACT inhaler Inhale 2 puffs into the lungs every 6 (six) hours as needed for wheezing.  1 Inhaler  3  . ALPRAZolam (XANAX) 0.5 MG tablet Take 0.5 mg by mouth at bedtime. For insomnia      . aspirin EC 81 MG tablet Take 81 mg by mouth every morning.      Marland Kitchen  citalopram (CELEXA) 40 MG tablet Take 40 mg by mouth every evening.      Marland Kitchen ibuprofen (ADVIL,MOTRIN) 200 MG tablet Take 200 mg by mouth every 8 (eight) hours as needed. For pain      . losartan (COZAAR) 50 MG tablet Take 50 mg by mouth every morning.      . metFORMIN (GLUCOPHAGE) 1000 MG tablet Take 1 tablet (1,000 mg total) by mouth 2 (two) times daily with a meal.  180 tablet  3  . metoprolol succinate (TOPROL-XL) 100 MG 24 hr tablet Take 100 mg by mouth every morning.      . Multiple Vitamin (MULTIVITAMIN) tablet Take 1 tablet by mouth daily at 12 noon.        The PMH, PSH, Social History, Family History, Medications, and allergies have been reviewed in [facility], and have been updated if relevant.  ROS: See HPI  Physical exam: BP 122/60  Pulse 72  Temp 97.6 F  (36.4 C)  Wt 118 lb (53.524 kg)  SpO2 98% Gen: alert, frail, tearful but otherwise NAD Psych: good eye contact. Moderately anxious.  Assessment and Plan:  1. Abnormal path report- >25 min spent with face to face with patient, >50% counseling and/or coordinating care.  Discussed with Dr. Morton Peters- I will refer to oncology for further work up given her persistent weight loss and pain.  Perhaps there are other studies they could do, i.e bone marrow biopsy, that would not be as invasive given her abnormal PFTs. Both pt and Dr. Morton Peters are in agreement. Referral placed.

## 2012-02-21 NOTE — Patient Instructions (Addendum)
Good to see you. Have a happy new year. Please stop by to see Shirlee Limerick on your way out.

## 2012-02-21 NOTE — Telephone Encounter (Signed)
LVOM for pt to return call in re NP appt.  °

## 2012-02-21 NOTE — Telephone Encounter (Signed)
S/W pt dtr Traci in re NP appt 01/07 @ 1:30 w/Dr. Arbutus Ped.  Referring Dr. Dayton Martes Dx- Lymphoid Neoplasm Welcome packet mailed

## 2012-02-28 ENCOUNTER — Telehealth: Payer: Self-pay | Admitting: Medical Oncology

## 2012-02-28 ENCOUNTER — Encounter: Payer: Self-pay | Admitting: Internal Medicine

## 2012-02-28 ENCOUNTER — Other Ambulatory Visit (HOSPITAL_BASED_OUTPATIENT_CLINIC_OR_DEPARTMENT_OTHER): Payer: Medicare Other | Admitting: Lab

## 2012-02-28 ENCOUNTER — Ambulatory Visit (HOSPITAL_BASED_OUTPATIENT_CLINIC_OR_DEPARTMENT_OTHER): Payer: Medicare Other | Admitting: Internal Medicine

## 2012-02-28 ENCOUNTER — Ambulatory Visit (HOSPITAL_BASED_OUTPATIENT_CLINIC_OR_DEPARTMENT_OTHER): Payer: Medicare Other

## 2012-02-28 ENCOUNTER — Telehealth: Payer: Self-pay | Admitting: Internal Medicine

## 2012-02-28 VITALS — BP 148/64 | HR 71 | Temp 97.6°F | Resp 20 | Ht 61.0 in | Wt 118.9 lb

## 2012-02-28 DIAGNOSIS — D47Z9 Other specified neoplasms of uncertain behavior of lymphoid, hematopoietic and related tissue: Secondary | ICD-10-CM

## 2012-02-28 DIAGNOSIS — D492 Neoplasm of unspecified behavior of bone, soft tissue, and skin: Secondary | ICD-10-CM

## 2012-02-28 DIAGNOSIS — F17200 Nicotine dependence, unspecified, uncomplicated: Secondary | ICD-10-CM

## 2012-02-28 LAB — COMPREHENSIVE METABOLIC PANEL (CC13)
AST: 12 U/L (ref 5–34)
Albumin: 3.6 g/dL (ref 3.5–5.0)
Alkaline Phosphatase: 87 U/L (ref 40–150)
BUN: 21 mg/dL (ref 7.0–26.0)
Potassium: 4 mEq/L (ref 3.5–5.1)
Sodium: 142 mEq/L (ref 136–145)
Total Protein: 6.7 g/dL (ref 6.4–8.3)

## 2012-02-28 LAB — CBC WITH DIFFERENTIAL/PLATELET
EOS%: 3 % (ref 0.0–7.0)
MCH: 31.7 pg (ref 25.1–34.0)
MCHC: 34 g/dL (ref 31.5–36.0)
MCV: 93.3 fL (ref 79.5–101.0)
MONO%: 9 % (ref 0.0–14.0)
RBC: 4.52 10*6/uL (ref 3.70–5.45)
RDW: 14.2 % (ref 11.2–14.5)

## 2012-02-28 NOTE — Telephone Encounter (Signed)
appts made and printed for pt pt aware that she will get a call [id number]       anne

## 2012-02-28 NOTE — Progress Notes (Signed)
Checked in new patient. No financial issues. °

## 2012-02-28 NOTE — Progress Notes (Signed)
[facility] [phone]   Fax:([phone]) [phone]  CONSULT NOTE  REFERRING PHYSICIAN: Dr. Donata Clay.  REASON FOR CONSULTATION:  77 years old white female with questionable lymphoma  HPI Marie Holmes is a 77 y.o. female with past medical history significant for multiple medical problems including history of hypertension, diabetes mellitus, dyslipidemia, anxiety, COPD, GERD, osteoarthritis as well as history of benign left breast tumor. The patient was seen in August of 2013 by her primary care physician and chest x-ray was performed on 09/27/2011 and it showed 1.0 CM nodular density projecting over the anterior aspect of the thorax noted only on the lateral projection. This was followed by CT scan of the chest with contrast on 10/03/2011 and it showed an ill-defined pleural based density with his the margins measuring 0.8 x 0.3 CM nodular component located peripherally in the left lower lobe. The patient was monitored closely and repeat CT scan of the chest on 01/04/2012 showed no change in the size or appearance of the peripheral 2.5 x 1.4 CM sub-solid nodule in the left lower lobe with a smaller 8 mm central solid component. The overall appearance is unusual and could represent an area of scarring. However, slow growing adenocarcinoma could have a similar appearance. The patient was referred to Dr. Donata Clay for evaluation. A PET scan on 01/27/2012 showed ill-defined left lower lobe ground-glass opacity demonstrate FDG uptake with SUV max of 2.4. This is worrisome for adenocarcinoma and biopsy was suggested. There was no mediastinal or hilar adenopathy and no findings for metastatic disease. MRI of the brain performed on the same day was negative for metastatic disease to the brain. According to Dr. Donata Clay the patient was not a candidate for surgical resection because of her poor pulmonary function. She underwent CT-guided core biopsy of the left lower lobe lesion by  interventional radiology on 02/06/2012. The final pathology showed atypical lymphoid proliferation. The sections show a single needle core biopsy displaying lung tissue infiltrated by a relatively monomorphic population of small round to irregular lymphocytes with high nuclear cytoplasmic ratio, dense chromatin and small to inconspicuous nuclei. This is admixed with occasional scattering of larger transformed lymphoid cells. Rare very small reactive appearing germinal centers are seen. The lymphoid cells are seen in expansile clusters and diffuse sheets associated with scattering of plasma cells in some areas. Flow cytometric material is not available. Hence, immunohistochemical stains were performed including CD20, CD79a, CD10, CD3, CD43, CD5, cyclin D1, kappa and lambda with appropriate controls. The lymphoid cells show predominance of B cells as seen with CD20 and CD79a admixed with a relatively minor population of T cells as seen with CD3 and CD5 stains. There is apparent co-expression of CD43 but not CD5 in B cell areas. Most of the cells are also CD10 negative although clustering of CD 10 positive cells is seen likely correlating with the presence of small germinal centers . Cyclin D1 is completely negative in lymphoid cells. Kappa and lambda stains are essentially non-contributory. Given the overall histologic appearance and the predominance of B cells, the overall findings are worrisome for non-Hodgkin's B cell lymphoma. An excisional biopsy is strongly recommended for further evaluation and lymphoma work up. The patient was referred to me today for further evaluation and recommendation regarding her condition. She is feeling fine today with no specific complaints except for chronic back pain and lack of energy. She lost around 30 pounds over the last 4 months. She also has some shortness breath  with exertion and mild cough but no sputum or hemoptysis. The patient denied having any night sweats. Her  family history is unremarkable for any malignancy. Her mother died at age 16 with broken hip and urinary tract infection and father died at age 40 with pulmonary embolism.  The patient is married and has 4 children. She was accompanied today by her husband Fayrene Fearing and her daughter French Ana. She was to do office work. She has a history of smoking one pack per day for around 55 years and unfortunately continues to smoke few cigarettes every day. She has no history of alcohol or drug abuse.    @SFHPI @  Past Medical History  Diagnosis Date  . Benign tumor of breast 2004    left  . Colon polyp   . Broken ribs     history of several broken ribs on right and compression  fx  . Fibroma of skin     Multiple fibromas/lipomas on arms and legs B  . Pyelonephritis   . Anxiety   . COPD (chronic obstructive pulmonary disease)   . Hyperlipidemia   . Osteoarthritis   . History of CT scan of abdomen 05/23/2003    Mild fatty liver inf, tiny hypodensity in right lobe of liver  . Kidney stone 02/21/1981    Right/removed  . Pulmonary nodules   . Diabetes mellitus without complication     Past Surgical History  Procedure Date  . Appendectomy 02/21/1961  . Exploratory laparotomy 02/21/1970    for ? adhesions  . Abdominal hysterectomy 02/21/1970    right ovary remaining    Family History  Problem Relation Age of Onset  . Parkinsonism Brother   . Stroke Father   . Cancer Sister     Social History History  Substance Use Topics  . Smoking status: Former Smoker -- 1.0 packs/day for 30 years    Quit date: 02/28/2012  . Smokeless tobacco: Never Used     Comment: 3/4 pack X 50 years  . Alcohol Use: No    Allergies  Allergen Reactions  . Sulfonamide Derivatives Swelling and Rash    Around the mouth swelling    Current Outpatient Prescriptions  Medication Sig Dispense Refill  . ALPRAZolam (XANAX) 0.5 MG tablet Take 0.5 mg by mouth at bedtime. For insomnia      . aspirin EC 81 MG tablet Take  81 mg by mouth every morning.      . citalopram (CELEXA) 40 MG tablet Take 40 mg by mouth every evening.      Marland Kitchen doxycycline (VIBRAMYCIN) 100 MG capsule Take 1 capsule (100 mg total) by mouth 2 (two) times daily.  20 capsule  0  . ibuprofen (ADVIL,MOTRIN) 200 MG tablet Take 200 mg by mouth every 8 (eight) hours as needed. For pain      . losartan (COZAAR) 50 MG tablet Take 50 mg by mouth every morning.      . metFORMIN (GLUCOPHAGE) 1000 MG tablet Take 1 tablet (1,000 mg total) by mouth 2 (two) times daily with a meal.  180 tablet  3  . metoprolol succinate (TOPROL-XL) 100 MG 24 hr tablet Take 100 mg by mouth every morning.      . Multiple Vitamin (MULTIVITAMIN) tablet Take 1 tablet by mouth daily at 12 noon.       Marland Kitchen albuterol (PROVENTIL HFA;VENTOLIN HFA) 108 (90 BASE) MCG/ACT inhaler Inhale 2 puffs into the lungs every 6 (six) hours as needed for wheezing.  1 Inhaler  3  Review of Systems  A comprehensive review of systems was negative except for: Constitutional: positive for fatigue and weight loss Respiratory: positive for cough and dyspnea on exertion Musculoskeletal: positive for back pain  Physical Exam  WUJ:WJXBJ, healthy, no distress, well nourished and well developed SKIN: skin color, texture, turgor are normal HEAD: Normocephalic, No masses, lesions, tenderness or abnormalities EYES: normal, PERRLA EARS: External ears normal OROPHARYNX:no exudate and no erythema  NECK: supple, no adenopathy LYMPH:  no palpable lymphadenopathy BREAST:not examined LUNGS: clear to auscultation  HEART: regular rate & rhythm and no murmurs ABDOMEN:abdomen soft, non-tender, normal bowel sounds and no masses or organomegaly BACK: Back symmetric, no curvature. EXTREMITIES:no joint deformities, effusion, or inflammation, no edema, no skin discoloration  NEURO: alert & oriented x 3 with fluent speech  PERFORMANCE STATUS: ECOG 1  LABORATORY DATA: Lab Results  Component Value Date   WBC 8.7  02/28/2012   HGB 14.3 02/28/2012   HCT 42.2 02/28/2012   MCV 93.3 02/28/2012   PLT 274 02/28/2012      Chemistry      Component Value Date/Time   NA 138 09/27/2011 1017   K 4.1 09/27/2011 1017   CL 101 09/27/2011 1017   CO2 29 09/27/2011 1017   BUN 11 01/16/2012 1633   CREATININE 0.73 01/16/2012 1633   CREATININE 0.8 09/27/2011 1017      Component Value Date/Time   CALCIUM 9.1 09/27/2011 1017   ALKPHOS 65 09/27/2011 1017   AST 14 09/27/2011 1017   ALT 10 09/27/2011 1017   BILITOT 0.7 09/27/2011 1017       RADIOGRAPHIC STUDIES: Dg Chest 2 View  02/06/2012  *RADIOLOGY REPORT*  Clinical Data: Status post left lower lobe lung biopsy.  CHEST - 2 VIEW  Comparison: Images from CT biopsy 02/06/2012.  PET scan 01/27/2012.  Findings: The heart size is normal.  Ill-defined density is present in the left lower lobe, in the region of the biopsy.  This is most compatible with mild parenchymal hemorrhage as evidenced on the CT scan.  Ill-defined right basilar airspace disease is likely atelectasis. There is no pneumothorax.  The visualized soft tissues and bony thorax are unremarkable.  IMPRESSION:  1.  Ill-defined airspace disease at the left base likely represents parenchymal hemorrhage surrounding the biopsy. 2.  Minimal right basilar atelectasis. 3.  No pneumothorax.   Original Report Authenticated By: Marin Roberts, M.D.    Ct Biopsy  02/06/2012  *RADIOLOGY REPORT*  Clinical history:77 year old with a suspicious lesion in the periphery of the left lower lobe.  ROCEDURE(S): CT GUIDED BIOPSY OF LEFT LOWER LOBE LESION  Physician: Rachelle Hora. Henn, MD  Medications:Versed 1.5 mg, Fentanyl 75 mcg. A radiology nurse monitored the patient for moderate sedation.  Moderate sedation time:12 minutes  Fluoroscopy time: 9 seconds CT fluoro  Procedure:The procedure was explained to the patient.  The risks and benefits of the procedure were discussed and the patient's questions were addressed.  Informed consent was obtained from the  patient.  The left side of the chest was slightly elevated.  A subtle lesion along the periphery the left lower lobe was identified.  The left side of the chest was prepped and draped in a sterile fashion.  The skin was anesthetized with 1% lidocaine. A 17 gauge needle was directed into the peripheral aspect of the lesion. Two core biopsies were obtained with an 18 gauge device.  17 gauge needle was removed without complication.  Findings:Small pleural-based density in the periphery of the left  lower lobe.  Needle placement confirmed with CT.  Small amount of parenchymal hemorrhage following the core biopsies.  Negative for a pneumothorax.  Complications: None  Impression:CT guided core biopsies of the left lower lobe lesion.   Original Report Authenticated By: Richarda Overlie, M.D.     ASSESSMENT: This is a very pleasant 77 years old white female with left lower lobe lesion suspicious for lymphoid neoplasm, non-Hodgkin B-cell lymphoma. The patient has no other evidence for metastatic disease outside this lesion in the left lung and no palpable lymphadenopathy or hypermetabolic lymphadenopathy on the PET scan.  PLAN: I have a lengthy discussion with the patient and her family today about her condition. I gave him several options for management of her condition including observation with repeat CT scan of the chest in 3 months, proceeding with a bone marrow biopsy and aspirate to rule out any bone marrow involvement by her lymphoid neoplasm versus referral to radiation oncology for consideration of curative radiotherapy to this lesion. After lengthy discussion for the options, the patient would like to proceed with a bone marrow biopsy and aspirate first and if negative she may consider curative radiotherapy to the left lower lobe lung lesion. I will arrange for the patient to have a bone marrow biopsy and aspirate in the next 1-2 weeks. She would come back for followup visit in 3 weeks for evaluation and discussion  of her biopsy results. The patient was advised to call me immediately if she has any concerning symptoms in the interval.  All questions were answered. The patient knows to call the clinic with any problems, questions or concerns. We can certainly see the patient much sooner if necessary.  Thank you so much for allowing me to participate in the care of Marie Holmes. I will continue to follow up the patient with you and assist in her care.  I spent 25 minutes counseling the patient face to face. The total time spent in the appointment was 50 minutes.  Sixto Bowdish K. 02/28/2012, 5:22 PM

## 2012-02-28 NOTE — Patient Instructions (Signed)
You have questionable lymphoid neoplasm in the left lung suspicious for B cell non-Hodgkin lymphoma. We discussed several options for management of her condition including proceeding with a bone marrow biopsy and aspirate versus observation versus curative radiotherapy. Your agreed to proceed with the bone marrow biopsy and aspirate first. Followup in 3 weeks for discussion of the biopsy results.

## 2012-02-28 NOTE — Telephone Encounter (Signed)
I Left message with BM asp and bx appt on jan 17 and to arrive to admitting at [facility] at 0645 , with a driver . Pt instructed to be NPO after midnight and to call me back to confirm. I also confirmed appt with flow cytometry for same date

## 2012-02-29 ENCOUNTER — Telehealth: Payer: Self-pay | Admitting: Family Medicine

## 2012-02-29 NOTE — Telephone Encounter (Signed)
Patient called to let you know that Dr Shirline Frees is going to do a Bone Marrow Biopsy on 03/09/12 at [facility]. If it is negative then he said she could do radiation therapy. He told the patient that the first Biopsy was inconclusive and that he would be in contact with Dr Alla German. She will keep you informed as to what is going on and I told her to call me and I would be happy to take her messages to you.

## 2012-02-29 NOTE — Telephone Encounter (Signed)
Thank you for the update!

## 2012-03-05 ENCOUNTER — Encounter (HOSPITAL_COMMUNITY): Payer: Self-pay | Admitting: Pharmacy Technician

## 2012-03-07 ENCOUNTER — Other Ambulatory Visit (HOSPITAL_COMMUNITY): Payer: Self-pay | Admitting: Internal Medicine

## 2012-03-09 ENCOUNTER — Ambulatory Visit (HOSPITAL_COMMUNITY)
Admission: RE | Admit: 2012-03-09 | Discharge: 2012-03-09 | Disposition: A | Discharge status: Home / Self Care | Payer: Medicare Other | Source: Ambulatory Visit | Attending: Internal Medicine | Admitting: Internal Medicine

## 2012-03-09 ENCOUNTER — Encounter (HOSPITAL_COMMUNITY): Payer: Self-pay

## 2012-03-09 VITALS — BP 143/64 | HR 75 | Temp 97.7°F | Resp 22 | Ht 61.0 in | Wt 118.0 lb

## 2012-03-09 DIAGNOSIS — D492 Neoplasm of unspecified behavior of bone, soft tissue, and skin: Secondary | ICD-10-CM

## 2012-03-09 DIAGNOSIS — D47Z9 Other specified neoplasms of uncertain behavior of lymphoid, hematopoietic and related tissue: Secondary | ICD-10-CM

## 2012-03-09 HISTORY — PX: BONE MARROW BIOPSY: SHX199

## 2012-03-09 LAB — CBC WITH DIFFERENTIAL/PLATELET
Eosinophils Absolute: 0.2 10*3/uL (ref 0.0–0.7)
Eosinophils Relative: 2 % (ref 0–5)
Lymphs Abs: 2.3 10*3/uL (ref 0.7–4.0)
MCH: 31.3 pg (ref 26.0–34.0)
MCV: 90.8 fL (ref 78.0–100.0)
Monocytes Absolute: 0.8 10*3/uL (ref 0.1–1.0)
Platelets: 271 10*3/uL (ref 150–400)
RBC: 4.25 MIL/uL (ref 3.87–5.11)

## 2012-03-09 MED ORDER — MIDAZOLAM HCL 10 MG/2ML IJ SOLN
INTRAMUSCULAR | Status: AC
  Filled 2012-03-09: qty 2

## 2012-03-09 MED ORDER — MIDAZOLAM HCL 2 MG/2ML IJ SOLN
INTRAMUSCULAR | Status: AC | PRN
  Administered 2012-03-09: 2 mg via INTRAVENOUS
  Administered 2012-03-09 (×3): 1 mg via INTRAVENOUS

## 2012-03-09 MED ORDER — MEPERIDINE HCL 50 MG/ML IJ SOLN
INTRAMUSCULAR | Status: AC
  Filled 2012-03-09: qty 1

## 2012-03-09 MED ORDER — SODIUM CHLORIDE 0.9 % IV SOLN
INTRAVENOUS | Status: DC
  Administered 2012-03-09: 07:00:00 via INTRAVENOUS

## 2012-03-09 NOTE — ED Notes (Signed)
Procedure ends and dressint to right posterior iliac area. Pt placed supine with towel to site for pressure

## 2012-03-09 NOTE — ED Notes (Signed)
Dressing CDI 

## 2012-03-09 NOTE — ED Notes (Signed)
Ambulated to BR with minimal assist and tolerated this well. Dressing CDI

## 2012-03-09 NOTE — Sedation Documentation (Signed)
Medication dose calculated and verified ZOX:WRUEAV 5mg  IV

## 2012-03-09 NOTE — ED Notes (Signed)
Vital signs stable. 

## 2012-03-09 NOTE — Procedures (Signed)
Bone Marrow Biopsy and Aspiration Procedure Note   Mallampati's Class: 0 ASA class: 1  Informed consent was obtained and potential risks including bleeding, infection and pain were reviewed with the patient.   Posterior iliac crest(s) prepped with Betadine.   Lidocaine 2% local anesthesia infiltrated into the subcutaneous tissue.  Left bone marrow biopsy and left bone marrow aspirate was obtained.   The procedure was tolerated well and there were no complications.  Specimens sent for: routine histopathologic stains and sectioning, flow cytometry and cytogenetics  Physician: Lajuana Matte.

## 2012-03-09 NOTE — ED Notes (Signed)
Patient is resting comfortably. 

## 2012-03-09 NOTE — ED Notes (Signed)
Patient denies pain and is resting comfortably.  

## 2012-03-09 NOTE — ED Notes (Signed)
dressing CDI 

## 2012-03-20 ENCOUNTER — Encounter: Payer: Self-pay | Admitting: Internal Medicine

## 2012-03-20 ENCOUNTER — Ambulatory Visit (HOSPITAL_BASED_OUTPATIENT_CLINIC_OR_DEPARTMENT_OTHER): Payer: Medicare Other | Admitting: Internal Medicine

## 2012-03-20 ENCOUNTER — Telehealth: Payer: Self-pay | Admitting: Internal Medicine

## 2012-03-20 VITALS — BP 143/75 | HR 84 | Temp 96.6°F | Resp 18 | Ht 61.0 in | Wt 118.8 lb

## 2012-03-20 DIAGNOSIS — D47Z9 Other specified neoplasms of uncertain behavior of lymphoid, hematopoietic and related tissue: Secondary | ICD-10-CM

## 2012-03-20 DIAGNOSIS — D492 Neoplasm of unspecified behavior of bone, soft tissue, and skin: Secondary | ICD-10-CM

## 2012-03-20 NOTE — Telephone Encounter (Signed)
appt made and printed for pt,pt given contrast

## 2012-03-20 NOTE — Progress Notes (Signed)
[facility] Telephone:([phone]) [phone]   Fax:(336) [phone]  OFFICE PROGRESS NOTE  Ruthe Mannan, MD [address] 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 female returns to the clinic today for followup visit accompanied by HER-2 daughters. The patient is feeling fine today with no specific complaints. She denied having any significant weight loss but she continues to have some night sweats. The patient denied having any significant chest pain, shortness of breath, cough or hemoptysis. She recently underwent a bone marrow biopsy and aspirate and she is here for evaluation and discussion of her biopsy results and recommendation regarding treatment of her condition.  MEDICAL HISTORY: Past Medical History  Diagnosis Date  . Benign tumor of breast 2004    left  . Colon polyp   . Broken ribs     history of several broken ribs on right and compression  fx  . Fibroma of skin     Multiple fibromas/lipomas on arms and legs B  . Pyelonephritis   . Anxiety   . COPD (chronic obstructive pulmonary disease)   . Hyperlipidemia   . Osteoarthritis   . History of CT scan of abdomen 05/23/2003    Mild fatty liver inf, tiny hypodensity in right lobe of liver  . Kidney stone 02/21/1981    Right/removed  . Pulmonary nodules   . Diabetes mellitus without complication     ALLERGIES:  is allergic to sulfonamide derivatives.  MEDICATIONS:  Current Outpatient Prescriptions  Medication Sig Dispense Refill  . ALPRAZolam (XANAX) 0.5 MG tablet Take 0.5 mg by mouth at bedtime. For insomnia      . aspirin EC 81 MG tablet Take 81 mg by mouth every morning.      . citalopram (CELEXA) 40 MG tablet Take 40 mg by mouth every evening.      Marland Kitchen ibuprofen (ADVIL,MOTRIN) 200 MG tablet Take 200 mg by mouth every 8  (eight) hours as needed. For pain      . metFORMIN (GLUCOPHAGE) 1000 MG tablet Take 1 tablet (1,000 mg total) by mouth 2 (two) times daily with a meal.  180 tablet  3  . metoprolol succinate (TOPROL-XL) 100 MG 24 hr tablet Take 100 mg by mouth every morning.      . Multiple Vitamin (MULTIVITAMIN) tablet Take 1 tablet by mouth daily at 12 noon.       Marland Kitchen albuterol (PROVENTIL HFA;VENTOLIN HFA) 108 (90 BASE) MCG/ACT inhaler Inhale 2 puffs into the lungs every 6 (six) hours as needed for wheezing.  1 Inhaler  3  . losartan (COZAAR) 50 MG tablet Take 50 mg by mouth every morning.        SURGICAL HISTORY:  Past Surgical History  Procedure Date  . Appendectomy 02/21/1961  . Exploratory laparotomy 02/21/1970    for ? adhesions  . Abdominal hysterectomy 02/21/1970    right ovary remaining    REVIEW OF SYSTEMS:  A comprehensive review of systems was negative.   PHYSICAL EXAMINATION: General appearance: alert, cooperative and no distress Head: Normocephalic, without obvious abnormality, atraumatic Neck: no adenopathy Lymph nodes: Cervical, supraclavicular, and axillary nodes normal. Resp: clear to auscultation bilaterally Cardio: regular rate and rhythm, S1, S2 normal, no murmur, click, rub or gallop GI: soft, non-tender; bowel sounds normal; no masses,  no organomegaly Extremities: extremities normal, atraumatic, no  cyanosis or edema  ECOG PERFORMANCE STATUS: 0 - Asymptomatic  Blood pressure 143/75, pulse 84, temperature 96.6 F (35.9 C), temperature source Oral, resp. rate 18, height 5\' 1"  (1.549 m), weight 118 lb 12.8 oz (53.887 kg).  LABORATORY DATA: Lab Results  Component Value Date   WBC 9.1 03/09/2012   HGB 13.3 03/09/2012   HCT 38.6 03/09/2012   MCV 90.8 03/09/2012   PLT 271 03/09/2012      Chemistry      Component Value Date/Time   NA 142 02/28/2012 1346   NA 138 09/27/2011 1017   K 4.0 02/28/2012 1346   K 4.1 09/27/2011 1017   CL 104 02/28/2012 1346   CL 101 09/27/2011 1017   CO2 30*  02/28/2012 1346   CO2 29 09/27/2011 1017   BUN 21.0 02/28/2012 1346   BUN 11 01/16/2012 1633   CREATININE 0.8 02/28/2012 1346   CREATININE 0.73 01/16/2012 1633   CREATININE 0.8 09/27/2011 1017      Component Value Date/Time   CALCIUM 10.2 02/28/2012 1346   CALCIUM 9.1 09/27/2011 1017   ALKPHOS 87 02/28/2012 1346   ALKPHOS 65 09/27/2011 1017   AST 12 02/28/2012 1346   AST 14 09/27/2011 1017   ALT 10 02/28/2012 1346   ALT 10 09/27/2011 1017   BILITOT 0.36 02/28/2012 1346   BILITOT 0.7 09/27/2011 1017     BONE MARROW REPORT FINAL DIAGNOSIS Diagnosis Bone Marrow, Aspirate,Biopsy, and Clot, left superior PIC - NORMOCELLULAR BONE MARROW FOR AGE WITH TRILINEAGE HEMATOPOIESIS. - NO TUMOR IDENTIFIED. PERIPHERAL BLOOD: - NO SIGNIFICANT MORPHOLOGIC ABNORMALITIES. Diagnosis Note There is no evidence of a lymphoproliferative process as seen by morphology and flow cytometric analysis. Guerry Bruin MD Pathologist, Electronic Signature (Case signed 03/12/2012) GROSS AND MICROSCOPIC INFOR  RADIOGRAPHIC STUDIES: No results found.  ASSESSMENT: This is a very pleasant 77 years old white female recently diagnosed with lymphoid neoplasm presented as an ill-defined pleural based density in the left lower lobe suspicious for non-Hodgkin lymphoma. The bone marrow biopsy and aspirate showed no evidence for involvement with lymphoma or other abnormality.  PLAN: I discussed the bone marrow biopsy and aspirate results with the patient and her family. I gave her the option of continuous observation and repeat CT scan of the chest in 3 months versus referral to radiation oncology for consideration of curative radiotherapy to this area in the left lower lobe. The patient would like to see radiation oncology. I would see her back for followup visit in 3 months with repeat CT scan of the chest, abdomen and pelvis with contrast for evaluation of her disease after the curative radiation. She was advised to call me immediately if she has any  concerning symptoms in the interval. All questions were answered. The patient knows to call the clinic with any problems, questions or concerns. We can certainly see the patient much sooner if necessary.

## 2012-03-20 NOTE — Patient Instructions (Signed)
No evidence for lymphoma involvement of the bone marrow. We discussed treatment options including observation versus referral to radiation oncology. He would like to see radiation oncology. Followup in 3 months with repeat CT scan of the chest, abdomen and pelvis.

## 2012-03-28 ENCOUNTER — Encounter: Payer: Self-pay | Admitting: Radiation Oncology

## 2012-03-28 ENCOUNTER — Ambulatory Visit
Admission: RE | Admit: 2012-03-28 | Discharge: 2012-03-28 | Disposition: A | Discharge status: Home / Self Care | Payer: Medicare Other | Source: Ambulatory Visit | Attending: Radiation Oncology | Admitting: Radiation Oncology

## 2012-03-28 VITALS — BP 162/67 | HR 64 | Temp 98.2°F | Resp 18 | Ht 61.0 in | Wt 119.9 lb

## 2012-03-28 DIAGNOSIS — E785 Hyperlipidemia, unspecified: Secondary | ICD-10-CM | POA: Insufficient documentation

## 2012-03-28 DIAGNOSIS — D492 Neoplasm of unspecified behavior of bone, soft tissue, and skin: Secondary | ICD-10-CM

## 2012-03-28 DIAGNOSIS — Z85118 Personal history of other malignant neoplasm of bronchus and lung: Secondary | ICD-10-CM | POA: Insufficient documentation

## 2012-03-28 DIAGNOSIS — F17200 Nicotine dependence, unspecified, uncomplicated: Secondary | ICD-10-CM | POA: Insufficient documentation

## 2012-03-28 DIAGNOSIS — Z79899 Other long term (current) drug therapy: Secondary | ICD-10-CM | POA: Insufficient documentation

## 2012-03-28 DIAGNOSIS — C8589 Other specified types of non-Hodgkin lymphoma, extranodal and solid organ sites: Secondary | ICD-10-CM | POA: Insufficient documentation

## 2012-03-28 DIAGNOSIS — J4489 Other specified chronic obstructive pulmonary disease: Secondary | ICD-10-CM | POA: Insufficient documentation

## 2012-03-28 DIAGNOSIS — E119 Type 2 diabetes mellitus without complications: Secondary | ICD-10-CM | POA: Insufficient documentation

## 2012-03-28 DIAGNOSIS — J449 Chronic obstructive pulmonary disease, unspecified: Secondary | ICD-10-CM | POA: Insufficient documentation

## 2012-03-28 NOTE — Progress Notes (Signed)
See progress note under physician encounter. 

## 2012-03-28 NOTE — Progress Notes (Signed)
Marie Holmes presents to the clinic today unaccompanied for consultation with Dr. Roselind Messier to discuss the role of radiation therapy in the treatment of pulmonary lymphoid neoplasm questionable for non hodgkin's b cell lymphoma. Marie Holmes is alert and oriented to person, place, and time. No distress noted. Steady gait noted. Pleasant affect noted. Marie Holmes reports middle back and bilateral leg pain 1 on a scale of 0-10 related to effects of osteoarthritis. Marie Holmes reports an unexplained weight loss of 30 lb in the last 3-4 months. Marie Holmes reports a great appetite. Marie Holmes reports eating "alot to try and put her weight back on." Marie Holmes reports intense sweats both during the day and at night. Marie Holmes reports occasional shortness of breath. Marie Holmes report dry cough. Marie Holmes reports cough worse at night. Marie Holmes denies chest pain. Reported all findings to Dr. Roselind Messier.

## 2012-03-28 NOTE — Progress Notes (Signed)
Complete PATIENT MEASURE OF DISTRESS worksheet with a score of 6 submitted to social work.

## 2012-03-28 NOTE — Progress Notes (Signed)
77 year old. White female.   Pulmonary lymphoid neoplasm questionable for non-Hodgkin's B cell lymphoma.   AX: sulfonamide derivatives Denies having a pacemaker No hx of radiation therapy

## 2012-03-28 NOTE — Progress Notes (Signed)
Radiation Oncology         [phone] ________________________________  Initial outpatient Consultation  Name: CECIL Holmes MRN: [id number]  Date: 03/28/2012  DOB: Mar 11, 1935  NW:GNFAO Dayton Martes, MD  Si Gaul, MD   REFERRING PHYSICIAN: Si Gaul, MD  DIAGNOSIS: The encounter diagnosis was Lymphoid neoplasm. clinical stage I non-Hodgkin's lymphoma  HISTORY OF PRESENT ILLNESS::Marie Holmes is a 77 y.o. female who is seen out of the courtesy of Dr. Arbutus Ped for an opinion concerning radiation therapy for what appears to be early stage non-Hodgkin's lymphoma.  The patient was seen in August of 2013 by her primary care physician and chest x-ray was performed on 09/27/2011 and it showed 1.0 CM nodular density projecting over the anterior aspect of the thorax noted only on the lateral projection. This was followed by CT scan of the chest with contrast on 10/03/2011 and it showed an ill-defined pleural based density with his the margins measuring 0.8 x 0.3 CM nodular component located peripherally in the left lower lobe. The patient was monitored closely and repeat CT scan of the chest on 01/04/2012 showed no change in the size or appearance of the peripheral 2.5 x 1.4 cm sub-solid nodule in the left lower lobe with a smaller 8 mm central solid component. The overall appearance is unusual and could represent an area of scarring. However, slow growing adenocarcinoma could have a similar appearance. The patient was referred to Dr. Donata Clay for evaluation. A PET scan on 01/27/2012 showed ill-defined left lower lobe ground-glass opacity demonstrate FDG uptake with SUV max of 2.4. This was worrisome for adenocarcinoma and biopsy was suggested. There was no mediastinal or hilar adenopathy and no findings for metastatic disease. MRI of the brain performed on the same day was negative for metastatic disease to the brain. According to Dr. Donata Clay the patient was not a candidate for surgical resection  because of her poor pulmonary function. She underwent CT-guided core biopsy of the left lower lobe lesion by interventional radiology on 02/06/2012. The final pathology showed atypical lymphoid proliferation. Given the overall histologic appearance and the predominance of B cells, the overall findings are worrisome for non-Hodgkin's B cell lymphoma.  The patient proceeded to undergo bone marrow aspiration biopsy to rule out lymphoma involvement. This did not show any lymphoid infiltration of the bone marrow.  At This time the patient appears to have  early stage non-Hodgkin's lymphoma and is referred to radiation oncology for consideration for definitive treatments.   PREVIOUS RADIATION THERAPY: No  PAST MEDICAL HISTORY:  has a past medical history of Benign tumor of breast (2004); Colon polyp; Broken ribs; Fibroma of skin; Pyelonephritis; Anxiety; COPD (chronic obstructive pulmonary disease); Hyperlipidemia; Osteoarthritis; History of CT scan of abdomen (05/23/2003); Kidney stone (02/21/1981); Pulmonary nodules; Diabetes mellitus without complication; Cancer; and Lung cancer.    PAST SURGICAL HISTORY: Past Surgical History  Procedure Date  . Appendectomy 02/21/1961  . Exploratory laparotomy 02/21/1970    for ? adhesions  . Abdominal hysterectomy 02/21/1970    right ovary remaining  . Bone marrow biopsy 03/09/2012  . Lung core biopsy 02/06/2012    FAMILY HISTORY: family history includes Cancer in her sister; Parkinsonism in her brother; and Stroke in her father.  SOCIAL HISTORY:  reports that she has been smoking Cigarettes.  She has a 15 pack-year smoking history. She has never used smokeless tobacco. She reports that she does not drink alcohol or use illicit drugs.  ALLERGIES: Sulfonamide derivatives  MEDICATIONS:  Current  Outpatient Prescriptions  Medication Sig Dispense Refill  . albuterol (PROVENTIL HFA;VENTOLIN HFA) 108 (90 BASE) MCG/ACT inhaler Inhale 2 puffs into the lungs every 6  (six) hours as needed for wheezing.  1 Inhaler  3  . ALPRAZolam (XANAX) 0.5 MG tablet Take 0.5 mg by mouth at bedtime. For insomnia      . aspirin EC 81 MG tablet Take 81 mg by mouth every morning.      . citalopram (CELEXA) 40 MG tablet Take 40 mg by mouth every evening.      Marland Kitchen ibuprofen (ADVIL,MOTRIN) 200 MG tablet Take 200 mg by mouth every 8 (eight) hours as needed. For pain      . losartan (COZAAR) 50 MG tablet Take 50 mg by mouth every morning.      . metFORMIN (GLUCOPHAGE) 1000 MG tablet Take 1 tablet (1,000 mg total) by mouth 2 (two) times daily with a meal.  180 tablet  3  . metoprolol succinate (TOPROL-XL) 100 MG 24 hr tablet Take 100 mg by mouth every morning.      . Multiple Vitamin (MULTIVITAMIN) tablet Take 1 tablet by mouth daily at 12 noon.         REVIEW OF SYSTEMS:  A 15 point review of systems is documented in the electronic medical record. This was obtained by the nursing staff. However, I reviewed this with the patient to discuss relevant findings and make appropriate changes.  Patient has lost approximately 30 pounds since May of last year. Does complain of hot flashes during the day and drenching sweats involving her scalp region at night.  Her appetite is reasonable. She denies any new bony pain. She denies any headaches or visual problems.   PHYSICAL EXAM:  height is 5\' 1"  (1.549 m) and weight is 119 lb 14.4 oz (54.386 kg). Her oral temperature is 98.2 F (36.8 C). Her blood pressure is 162/67 and her pulse is 64. Her respiration is 18 and oxygen saturation is 99%.   BP 162/67  Pulse 64  Temp 98.2 F (36.8 C) (Oral)  Resp 18  Ht 5\' 1"  (1.549 m)  Wt 119 lb 14.4 oz (54.386 kg)  BMI 22.65 kg/m2  SpO2 99%  General Appearance:    Alert, cooperative, no distress, appears stated age  Head:    Normocephalic, without obvious abnormality, atraumatic  Eyes:    PERRL, conjunctiva/corneas clear, EOM's intact,      Ears:    Normal TM's and external ear canals, both ears    Nose:   Nares normal, septum midline, mucosa normal, no drainage    or sinus tenderness  Throat:   Lips, mucosa, and tongue normal;  gums normal  Neck:   Supple, symmetrical, trachea midline, no adenopathy;    thyroid:  no enlargement/tenderness/nodules; no carotid   bruit or JVD  Back:     Symmetric, no curvature, ROM normal, no CVA tenderness  Lungs:     Clear to auscultation bilaterally, respirations unlabored  Chest Wall:    No tenderness or deformity   Heart:    Regular rate and rhythm,      Abdomen:     Soft, non-tender, bowel sounds active all four quadrants,    no masses, no organomegaly        Extremities:   Extremities normal, atraumatic, no cyanosis, some edema in the ankle and foot area  Pulses:   2+ and symmetric all extremities  Skin:   Skin color, texture, turgor normal, no rashes or lesions  Lymph nodes:   Cervical, supraclavicular, and axillary nodes normal  Neurologic:   , normal strength, sensation and reflexes    throughout    LABORATORY DATA:  Lab Results  Component Value Date   WBC 9.1 03/09/2012   HGB 13.3 03/09/2012   HCT 38.6 03/09/2012   MCV 90.8 03/09/2012   PLT 271 03/09/2012   Lab Results  Component Value Date   NA 142 02/28/2012   K 4.0 02/28/2012   CL 104 02/28/2012   CO2 30* 02/28/2012   Lab Results  Component Value Date   ALT 10 02/28/2012   AST 12 02/28/2012   ALKPHOS 87 02/28/2012   BILITOT 0.36 02/28/2012     RADIOGRAPHY:. Ill-defined left lower lobe ground-glass opacity demonstrates  FDG uptake with SUV max of 2.4. This is worrisome for  adenocarcinoma. Biopsy is suggested.  2. No mediastinal or hilar adenopathy and no findings for  metastatic disease.     IMPRESSION: Apparent stage I non-Hodgkin's lymphoma presenting in the left lateral chest region. I discussed options for management with the patient including close followup with regular interval scanning versus radiation therapy. At this time the patient would like to proceed with a definitive  course of radiation therapy. I would anticipate approximately 4 weeks radiation therapy  PLAN: Simulation and planning next week.  I spent 60 minutes minutes face to face with the patient and more than 50% of that time was spent in counseling and/or coordination of care.   ------------------------------------------------  -----------------------------------  Billie Lade, PhD, MD

## 2012-03-30 NOTE — Addendum Note (Signed)
Encounter addended by: Kimley Apsey Mintz Tiaira Arambula, RN on: 03/30/2012  5:58 PM<BR>     Documentation filed: Charges VN

## 2012-04-02 ENCOUNTER — Ambulatory Visit (INDEPENDENT_AMBULATORY_CARE_PROVIDER_SITE_OTHER): Payer: Medicare Other | Admitting: Family Medicine

## 2012-04-02 ENCOUNTER — Encounter: Payer: Self-pay | Admitting: Family Medicine

## 2012-04-02 ENCOUNTER — Other Ambulatory Visit: Payer: Self-pay

## 2012-04-02 VITALS — BP 130/60 | HR 76 | Temp 97.7°F | Wt 117.0 lb

## 2012-04-02 DIAGNOSIS — I10 Essential (primary) hypertension: Secondary | ICD-10-CM

## 2012-04-02 DIAGNOSIS — D47Z9 Other specified neoplasms of uncertain behavior of lymphoid, hematopoietic and related tissue: Secondary | ICD-10-CM

## 2012-04-02 DIAGNOSIS — D492 Neoplasm of unspecified behavior of bone, soft tissue, and skin: Secondary | ICD-10-CM

## 2012-04-02 DIAGNOSIS — D381 Neoplasm of uncertain behavior of trachea, bronchus and lung: Secondary | ICD-10-CM

## 2012-04-02 DIAGNOSIS — E119 Type 2 diabetes mellitus without complications: Secondary | ICD-10-CM

## 2012-04-02 MED ORDER — METFORMIN HCL ER (OSM) 1000 MG PO TB24: 1000.0000 mg | ORAL_TABLET | Freq: Every day | ORAL | Status: DC

## 2012-04-02 MED ORDER — VARENICLINE TARTRATE 0.5 MG X 11 & 1 MG X 42 PO MISC: ORAL | Status: DC

## 2012-04-02 NOTE — Progress Notes (Signed)
Subjective:    Patient ID: Marie Holmes, female    DOB: March 17, 1935, 77 y.o.   MRN: [id number]  HPI 77 yo very pleasant female with h/o DM, HTN and recent diagnosis of early lymphoid CA here for follow up.  Notes reviewed from Dr. Roselind Messier, rad onc, they plan to proceed with radiation tx which will start next week.  DM-  a1c was fantastic in December.  She recently decreased her Metformin to 1000 mg daily instead of twice daily due to diarrhea.  Fasting CBG was elevated to 212 this morning, was 140 yesterday. Lab Results  Component Value Date   HGBA1C 6.5 02/21/2012   She has lost significant weight in recent months, likely due to increased stressors and recent cancer diagnosis. Wt Readings from Last 3 Encounters:  04/02/12 117 lb (53.071 kg)  03/20/12 118 lb 12.8 oz (53.887 kg)  03/09/12 118 lb (53.524 kg)   HTN- On Toprol 100 mg XL.  BP has been well controlled but at rad onc office is was elevated to 162/67.  She denies any HA, CP, blurred vision, SOB or LE edema.    Patient Active Problem List  Diagnosis  . COLONIC POLYPS  . DIABETES MELLITUS, TYPE II  . HYPERLIPIDEMIA  . ANEMIA, IRON DEFICIENCY, UNSPEC.  Marland Kitchen ANXIETY  . HYPERTENSION  . VENOUS INSUFFICIENCY, CHRONIC  . COPD  . GASTROESOPHAGEAL REFLUX, NO ESOPHAGITIS  . IRRITABLE BOWEL SYNDROME  . OSTEOARTHRITIS, MULTI SITES  . Osteoporosis, unspecified  . INSOMNIA NOS  . INCONTINENCE, URGE  . Weight loss  . Lymphoid neoplasm   Past Medical History  Diagnosis Date  . Benign tumor of breast 2004    left  . Colon polyp   . Broken ribs     history of several broken ribs on right and compression  fx  . Fibroma of skin     Multiple fibromas/lipomas on arms and legs B  . Pyelonephritis   . Anxiety   . COPD (chronic obstructive pulmonary disease)   . Hyperlipidemia   . Osteoarthritis   . History of CT scan of abdomen 05/23/2003    Mild fatty liver inf, tiny hypodensity in right lobe of liver  . Kidney stone  02/21/1981    Right/removed  . Pulmonary nodules   . Diabetes mellitus without complication   . Cancer     non-hodgkins b cell lymphoma  . 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 Appendectomy  02/21/1961  . Exploratory laparotomy  02/21/1970    for ? adhesions  . Abdominal hysterectomy  02/21/1970    right ovary remaining  . Bone marrow biopsy  03/09/2012  . Lung core biopsy  02/06/2012   History  Substance Use Topics  . Smoking status: Current Every Day Smoker -- 0.50 packs/day for 30 years    Types: Cigarettes  . Smokeless tobacco: Never Used     Comment: 3/4 pack X 50 years  . Alcohol Use: No   Family History  Problem Relation Age of Onset  . Parkinsonism Brother   . Stroke Father   . Cancer Sister    Allergies  Allergen Reactions  . Sulfonamide Derivatives Swelling and Rash    Around the mouth swelling   Current Outpatient Prescriptions on File Prior to Visit  Medication Sig Dispense Refill  . albuterol (PROVENTIL HFA;VENTOLIN HFA) 108 (90 BASE) MCG/ACT inhaler Inhale 2 puffs into the lungs  every 6 (six) hours as needed for wheezing.  1 Inhaler  3  . ALPRAZolam (XANAX) 0.5 MG tablet Take 0.5 mg by mouth at bedtime. For insomnia      . aspirin EC 81 MG tablet Take 81 mg by mouth every morning.      . citalopram (CELEXA) 40 MG tablet Take 40 mg by mouth every evening.      Marland Kitchen ibuprofen (ADVIL,MOTRIN) 200 MG tablet Take 200 mg by mouth every 8 (eight) hours as needed. For pain      . losartan (COZAAR) 50 MG tablet Take 50 mg by mouth every morning.      . metFORMIN (GLUCOPHAGE) 1000 MG tablet Take 1 tablet (1,000 mg total) by mouth 2 (two) times daily with a meal.  180 tablet  3  . metoprolol succinate (TOPROL-XL) 100 MG 24 hr tablet Take 100 mg by mouth every morning.      . Multiple Vitamin (MULTIVITAMIN) tablet Take 1 tablet by mouth daily at 12 noon.        No current  facility-administered medications on file prior to visit.   The PMH, PSH, Social History, Family History, Medications, and allergies have been reviewed in [facility], and have been updated if relevant.   Review of Systems  See HPI     Objective:   Physical Exam  BP 130/60  Pulse 76  Temp(Src) 97.7 F (36.5 C)  Wt 117 lb (53.071 kg)  BMI 22.12 kg/m2  Constitutional: Vital signs are normal. She appears well-developed and well-nourished. She is cooperative.  Non-toxic appearance. She does not appear ill. No distress.  HENT:  Head: Normocephalic.  Cardiovascular: Normal rate, regular rhythm, S1 normal, S2 normal, normal heart sounds, intact distal pulses and normal pulses.  Exam reveals no gallop and no friction rub.   No murmur heard. Pulmonary/Chest: Effort normal and breath sounds normal. Not tachypneic. No respiratory distress. She has no decreased breath sounds. She has no wheezes. She has no rhonchi. She has no rales.  Abdominal: Soft. Normal appearance and bowel sounds are normal. There is no tenderness.  Neurological: She is alert.  Skin: Skin is warm, dry and intact. No rash noted.  Psychiatric: Her speech is normal and behavior is normal. Judgment and thought content normal. Her mood appears not anxious. Cognition and memory are normal. She does not exhibit a depressed mood.      Assessment & Plan:    1. HYPERTENSION Stable.  Was likely elevated at rad onc because she was nervous about appt.  Asymptomatic.  Continue current dose of Toprol.  The patient indicates understanding of these issues and agrees with the plan.   2. DIABETES MELLITUS, TYPE II Deteriorated- a1c was great one month ago. She did decrease her own metformin dose.  Will change to ER form, hopefully, this will help with blood sugar spikes. Follow up a1c in 2 months.  3. Lymphoid neoplasm Starting radiation tx next week.

## 2012-04-02 NOTE — Patient Instructions (Addendum)
Great to see you, Ms. Marie Holmes. We are changing your metformin to extended release.  Congratulations on wanting to quit smoking.  This is the best thing you can do for your self.  I sent in a prescription for chantix.  Good luck with radiation.  Keep in touch.

## 2012-04-03 ENCOUNTER — Ambulatory Visit
Admission: RE | Admit: 2012-04-03 | Discharge: 2012-04-03 | Disposition: A | Discharge status: Home / Self Care | Payer: Medicare Other | Source: Ambulatory Visit | Attending: Radiation Oncology | Admitting: Radiation Oncology

## 2012-04-03 DIAGNOSIS — C8589 Other specified types of non-Hodgkin lymphoma, extranodal and solid organ sites: Secondary | ICD-10-CM | POA: Insufficient documentation

## 2012-04-03 DIAGNOSIS — D492 Neoplasm of unspecified behavior of bone, soft tissue, and skin: Secondary | ICD-10-CM

## 2012-04-03 DIAGNOSIS — Z510 Encounter for antineoplastic radiation therapy: Secondary | ICD-10-CM | POA: Insufficient documentation

## 2012-04-03 DIAGNOSIS — J358 Other chronic diseases of tonsils and adenoids: Secondary | ICD-10-CM | POA: Insufficient documentation

## 2012-04-03 DIAGNOSIS — Z7982 Long term (current) use of aspirin: Secondary | ICD-10-CM | POA: Insufficient documentation

## 2012-04-03 DIAGNOSIS — Z79899 Other long term (current) drug therapy: Secondary | ICD-10-CM | POA: Insufficient documentation

## 2012-04-05 NOTE — Progress Notes (Signed)
  Radiation Oncology         [phone]) [phone] ________________________________  Name: SIMRA FIEBIG MRN: [id number]  Date: 04/03/2012  DOB: [birth year]  SIMULATION AND TREATMENT PLANNING NOTE  DIAGNOSIS:  Non-Hodgkin's B-cell lymphoma  NARRATIVE:  The patient was brought to the CT Simulation planning suite.  Identity was confirmed.  All relevant records and images related to the planned course of therapy were reviewed.  The patient freely provided informed written consent to proceed with treatment after reviewing the details related to the planned course of therapy. The consent form was witnessed and verified by the simulation staff.  Then, the patient was set-up in a stable reproducible  supine position for radiation therapy.  CT images were obtained.  Surface markings were placed.  The CT images were loaded into the planning software.  Then the target and avoidance structures were contoured.  Treatment planning then occurred.  The radiation prescription was entered and confirmed.  Then, I designed and supervised the construction of a total of 1 medically necessary complex treatment devices.  I have requested : 3D Simulation  I have requested a DVH of the following structures: GTV, PTV, Heart, Lungs, spinal cord.  I have ordered:dose calc.  PLAN:  The patient will receive 40 Gy in 20 fractions.  ________________________________   Billie Lade, PhD, MD

## 2012-04-10 ENCOUNTER — Ambulatory Visit
Admission: RE | Admit: 2012-04-10 | Discharge: 2012-04-10 | Disposition: A | Discharge status: Home / Self Care | Payer: Medicare Other | Source: Ambulatory Visit | Attending: Radiation Oncology | Admitting: Radiation Oncology

## 2012-04-10 ENCOUNTER — Telehealth: Payer: Self-pay | Admitting: Dietician

## 2012-04-11 ENCOUNTER — Encounter: Payer: Self-pay | Admitting: Internal Medicine

## 2012-04-11 ENCOUNTER — Ambulatory Visit
Admission: RE | Admit: 2012-04-11 | Discharge: 2012-04-11 | Disposition: A | Discharge status: Home / Self Care | Payer: Medicare Other | Source: Ambulatory Visit | Attending: Radiation Oncology | Admitting: Radiation Oncology

## 2012-04-11 MED ORDER — RADIAPLEXRX EX GEL
Freq: Once | CUTANEOUS | Status: AC
  Administered 2012-04-11: 12:00:00 via TOPICAL

## 2012-04-11 NOTE — Progress Notes (Signed)
Oriented patient to staff and routine of the clinic. Provided patient with RADIATION THERAPY AND YOU handbook and RADIATION THERAPY TO CHEST handout then, reviewed pertinent information. Educated patient on potential side effects and management such as, shortness of breath, cough, throat changes, skin changes and fatigue. Provided patient with radiaplex and directed upon use. All questions answered. Provided patient with this writer's contact information and encouraged to call with future needs. Patient verbalized understanding of all reviewed.

## 2012-04-11 NOTE — Progress Notes (Signed)
Marie Holmes expressed concern about palpable raised area along right ribs. No bruising noted. Marie Holmes reports that she turned wrong while playing in her bed with her dog yesterday. Marie Holmes reports discomfort when she takes a deep breath. Marie Holmes has history of right fx ribs and osteoporosis. Will inform Dr. Roselind Messier of these findings.

## 2012-04-12 ENCOUNTER — Encounter: Payer: Self-pay | Admitting: Radiation Oncology

## 2012-04-12 ENCOUNTER — Ambulatory Visit
Admission: RE | Admit: 2012-04-12 | Discharge: 2012-04-12 | Disposition: A | Discharge status: Home / Self Care | Payer: Medicare Other | Source: Ambulatory Visit | Attending: Radiation Oncology | Admitting: Radiation Oncology

## 2012-04-12 VITALS — Resp 18 | Wt 119.0 lb

## 2012-04-12 MED ORDER — AMOXICILLIN-POT CLAVULANATE 875-125 MG PO TABS: 1.0000 | ORAL_TABLET | Freq: Two times a day (BID) | ORAL | Status: DC

## 2012-04-12 NOTE — Progress Notes (Signed)
Patient presents to the clinic today for PUT with Dr. Roselind Messier. Patient alert and oriented to person, place, and time. No distress noted. Steady gait noted. Pleasant affect noted. Patient reports right side rib pain 5 on a scale of 0-10. Patient thinks she may have a sinus infection but, is unable to get in to see PCP because she only works half days and the patient is here receiving treatment when her PCP is working. Patient reports a productive cough with green sputum that has increased in frequency. Edema noted along right rib without bruising. Patient continues to reports increased right rib pain when she inhales deeply. Patient reports taking motrin last night for pain. Reported all findings to Dr. Roselind Messier.

## 2012-04-12 NOTE — Progress Notes (Signed)
[facility]    Radiation Oncology [address] [place]     Maryln Gottron, M.D. [place], [place] [address]               Billie Lade, M.D., Ph.D. Phone: [phone]      Molli Hazard A. Kathrynn Running, M.D. Fax: [phone]      Radene Gunning, M.D., Ph.D.         Lurline Hare, M.D.         Grayland Jack, M.D Weekly Treatment Management Note  Name: Marie Holmes     MRN: [id number]        CSN: [id number] Date: 04/12/2012      DOB: Jan 18, 1936  CC: Ruthe Mannan, MD         Dayton Martes    Status: Outpatient  Diagnosis: The encounter diagnosis was Lymphoid neoplasm.  Current Dose: 4 Gy  Current Fraction: 2  Planned Dose: 40 Gy  Narrative: [name] was seen today for weekly treatment management. The chart was checked and port films  were reviewed. She is tolerating her radiation therapy directed at the left lateral chest well at this time.  She did develop some pain along the right lower anterior rib cage area after playing with her dog approximately 7 days ago. She did have a lot of pain in this area but this problem is improving. She did not hear a popping sensation. This is also had a sinus pressure as well as greenish sputum and a sore throat. She has had some chills.  Sulfonamide derivatives Current Outpatient Prescriptions  Medication Sig Dispense Refill  . albuterol (PROVENTIL HFA;VENTOLIN HFA) 108 (90 BASE) MCG/ACT inhaler Inhale 2 puffs into the lungs every 6 (six) hours as needed for wheezing.  1 Inhaler  3  . ALPRAZolam (XANAX) 0.5 MG tablet Take 0.5 mg by mouth at bedtime. For insomnia      . aspirin EC 81 MG tablet Take 81 mg by mouth every morning.      . citalopram (CELEXA) 40 MG tablet Take 40 mg by mouth every evening.      Marland Kitchen ibuprofen (ADVIL,MOTRIN) 200 MG tablet Take 200 mg by mouth every 8 (eight) hours as needed. For pain      . losartan (COZAAR) 50 MG tablet Take 50 mg by mouth every morning.      . metformin (FORTAMET) 1000 MG (OSM) 24 hr tablet Take 1  tablet (1,000 mg total) by mouth daily with breakfast.  30 tablet  6  . metoprolol succinate (TOPROL-XL) 100 MG 24 hr tablet Take 100 mg by mouth every morning.      . Multiple Vitamin (MULTIVITAMIN) tablet Take 1 tablet by mouth daily at 12 noon.       . varenicline (CHANTIX STARTING MONTH PAK) 0.5 MG X 11 & 1 MG X 42 tablet Take one 0.5 mg tablet by mouth once daily for 3 days, then increase to one 0.5 mg tablet twice daily for 4 days, then increase to one 1 mg tablet twice daily.  53 tablet  0  . Wound Cleansers (RADIAPLEX EX) Apply topically.      Marland Kitchen amoxicillin-clavulanate (AUGMENTIN) 875-125 MG per tablet Take 1 tablet by mouth 2 (two) times daily.  14 tablet  0   No current facility-administered medications for this encounter.   Labs:  Lab Results  Component Value Date   WBC 9.1 03/09/2012   HGB 13.3 03/09/2012   HCT 38.6 03/09/2012   MCV  90.8 03/09/2012   PLT 271 03/09/2012   Lab Results  Component Value Date   CREATININE 0.8 02/28/2012   BUN 21.0 02/28/2012   NA 142 02/28/2012   K 4.0 02/28/2012   CL 104 02/28/2012   CO2 30* 02/28/2012   Lab Results  Component Value Date   ALT 10 02/28/2012   AST 12 02/28/2012   BILITOT 0.36 02/28/2012    Physical Examination:  weight is 119 lb (53.978 kg). Her respiration is 18.    Wt Readings from Last 3 Encounters:  04/12/12 119 lb (53.978 kg)  04/02/12 117 lb (53.071 kg)  03/20/12 118 lb 12.8 oz (53.887 kg)    There is erythema to the posterior pharynx but no obvious exudates. She does have pressure on the left maxillary sinus area with palpation. No palpable adenopathy in the neck. Lungs - Normal respiratory effort, chest expands symmetrically. Lungs are clear to auscultation, no crackles or wheezes.  Heart has regular rhythm and rate  Abdomen is soft and non tender with normal bowel sounds There is some mild swelling on her right anterior lower rib cage area. There is no obvious rib fracture. She is minimally tender with palpation in this area  now  Assessment:  Patient tolerating treatments well  Plan: Continue treatment per original radiation prescription. She has an upper respiratory illness most likely sinusitis and will be placed on Augmentin.

## 2012-04-13 ENCOUNTER — Ambulatory Visit
Admission: RE | Admit: 2012-04-13 | Discharge: 2012-04-13 | Disposition: A | Discharge status: Home / Self Care | Payer: Medicare Other | Source: Ambulatory Visit | Attending: Radiation Oncology | Admitting: Radiation Oncology

## 2012-04-16 ENCOUNTER — Ambulatory Visit
Admission: RE | Admit: 2012-04-16 | Discharge: 2012-04-16 | Disposition: A | Discharge status: Home / Self Care | Payer: Medicare Other | Source: Ambulatory Visit | Attending: Radiation Oncology | Admitting: Radiation Oncology

## 2012-04-17 ENCOUNTER — Ambulatory Visit
Admission: RE | Admit: 2012-04-17 | Discharge: 2012-04-17 | Disposition: A | Discharge status: Home / Self Care | Payer: Medicare Other | Source: Ambulatory Visit | Attending: Radiation Oncology | Admitting: Radiation Oncology

## 2012-04-17 ENCOUNTER — Encounter: Payer: Self-pay | Admitting: Radiation Oncology

## 2012-04-17 VITALS — BP 126/58 | HR 57 | Resp 18 | Wt 119.5 lb

## 2012-04-17 DIAGNOSIS — D492 Neoplasm of unspecified behavior of bone, soft tissue, and skin: Secondary | ICD-10-CM

## 2012-04-17 NOTE — Progress Notes (Signed)
[facility]    Radiation Oncology [address] [place]     Maryln Gottron, M.D. [place], [place] [address]               Billie Lade, M.D., Ph.D. Phone: [phone]      Molli Hazard A. Kathrynn Running, M.D. Fax: [phone]      Radene Gunning, M.D., Ph.D.         Lurline Hare, M.D.         Grayland Jack, M.D Weekly Treatment Management Note  Name: Marie Holmes     MRN: [id number]        CSN: [id number] Date: 04/17/2012      DOB: Feb 08, 1936  CC: Marie Mannan, MD         Marie Holmes    Status: Outpatient  Diagnosis: The encounter diagnosis was Lymphoid neoplasm.  Current Dose: 10 Gy  Current Fraction: 5  Planned Dose: 40 Gy  Narrative: Marie Holmes was seen today for weekly treatment management. The chart was checked and port films  were reviewed. She is tolerating the treatments well at this time. She is feeling better after being placed on Augmentin for her sinusitis.  Sulfonamide derivatives Current Outpatient Prescriptions  Medication Sig Dispense Refill  . albuterol (PROVENTIL HFA;VENTOLIN HFA) 108 (90 BASE) MCG/ACT inhaler Inhale 2 puffs into the lungs every 6 (six) hours as needed for wheezing.  1 Inhaler  3  . ALPRAZolam (XANAX) 0.5 MG tablet Take 0.5 mg by mouth at bedtime. For insomnia      . amoxicillin-clavulanate (AUGMENTIN) 875-125 MG per tablet Take 1 tablet by mouth 2 (two) times daily.  14 tablet  0  . aspirin EC 81 MG tablet Take 81 mg by mouth every morning.      . citalopram (CELEXA) 40 MG tablet Take 40 mg by mouth every evening.      Marland Kitchen ibuprofen (ADVIL,MOTRIN) 200 MG tablet Take 200 mg by mouth every 8 (eight) hours as needed. For pain      . losartan (COZAAR) 50 MG tablet Take 50 mg by mouth every morning.      . metformin (FORTAMET) 1000 MG (OSM) 24 hr tablet Take 1 tablet (1,000 mg total) by mouth daily with breakfast.  30 tablet  6  . metoprolol succinate (TOPROL-XL) 100 MG 24 hr tablet Take 100 mg by mouth every morning.      . Multiple Vitamin  (MULTIVITAMIN) tablet Take 1 tablet by mouth daily at 12 noon.       . Wound Cleansers (RADIAPLEX EX) Apply topically.      . varenicline (CHANTIX STARTING MONTH PAK) 0.5 MG X 11 & 1 MG X 42 tablet Take one 0.5 mg tablet by mouth once daily for 3 days, then increase to one 0.5 mg tablet twice daily for 4 days, then increase to one 1 mg tablet twice daily.  53 tablet  0   No current facility-administered medications for this encounter.   Labs:  Physical Examination:  weight is 119 lb 8 oz (54.205 kg). Her blood pressure is 126/58 and her pulse is 57. Her respiration is 18 and oxygen saturation is 100%.    Wt Readings from Last 3 Encounters:  04/17/12 119 lb 8 oz (54.205 kg)  04/12/12 119 lb (53.978 kg)  04/02/12 117 lb (53.071 kg)   No appreciable skin reaction along the left lateral chest wall area. The oral cavity is moist without secondary infection. There is mild erythema to  the posterior pharynx. No exudates Lungs - Normal respiratory effort, chest expands symmetrically. Lungs are clear to auscultation, no crackles or wheezes.  Heart has regular rhythm and rate  Abdomen is soft and non tender with normal bowel sounds  Assessment:  Patient tolerating treatments well  Plan: Continue treatment per original radiation prescription

## 2012-04-17 NOTE — Progress Notes (Signed)
Marie Holmes presents to the clinic today unaccompanied for PUT with Dr. Roselind Messier. Marie Holmes alert and oriented to person, place, and time. No distress noted. Steady gait noted. Pleasant affect noted. Marie Holmes denies pain at this time. Marie Holmes reports that she has not had a cigarette in one week now. Marie Holmes reports a productive cough with green sputum. Marie Holmes reports that her cough is worse at night. Marie Holmes reports that her right ribs remain sore but, she attributes this to her frequent cough. Marie Holmes reports that she has one more day left of antibiotics for this cold. Marie Holmes reports running a low grade fever at night around 99.5. Marie Holmes reports shortness of breath with exertion. Marie Holmes denies skin changes to treated area. Marie Holmes reports using radiaplex bid as directed. Reported all findings to Dr. Roselind Messier.

## 2012-04-18 ENCOUNTER — Ambulatory Visit
Admission: RE | Admit: 2012-04-18 | Discharge: 2012-04-18 | Disposition: A | Discharge status: Home / Self Care | Payer: Medicare Other | Source: Ambulatory Visit | Attending: Radiation Oncology | Admitting: Radiation Oncology

## 2012-04-19 ENCOUNTER — Ambulatory Visit
Admission: RE | Admit: 2012-04-19 | Discharge: 2012-04-19 | Disposition: A | Discharge status: Home / Self Care | Payer: Medicare Other | Source: Ambulatory Visit | Attending: Radiation Oncology | Admitting: Radiation Oncology

## 2012-04-19 MED ORDER — RADIAPLEXRX EX GEL
Freq: Once | CUTANEOUS | Status: AC
  Administered 2012-04-19: 11:00:00 via TOPICAL

## 2012-04-20 ENCOUNTER — Ambulatory Visit
Admission: RE | Admit: 2012-04-20 | Discharge: 2012-04-20 | Disposition: A | Discharge status: Home / Self Care | Payer: Medicare Other | Source: Ambulatory Visit | Attending: Radiation Oncology | Admitting: Radiation Oncology

## 2012-04-23 ENCOUNTER — Ambulatory Visit
Admission: RE | Admit: 2012-04-23 | Discharge: 2012-04-23 | Disposition: A | Discharge status: Home / Self Care | Payer: Medicare Other | Source: Ambulatory Visit | Attending: Radiation Oncology | Admitting: Radiation Oncology

## 2012-04-24 ENCOUNTER — Ambulatory Visit
Admission: RE | Admit: 2012-04-24 | Discharge: 2012-04-24 | Disposition: A | Discharge status: Home / Self Care | Payer: Medicare Other | Source: Ambulatory Visit | Attending: Radiation Oncology | Admitting: Radiation Oncology

## 2012-04-24 ENCOUNTER — Encounter: Payer: Self-pay | Admitting: Radiation Oncology

## 2012-04-24 VITALS — BP 134/51 | HR 63 | Resp 18 | Wt 124.6 lb

## 2012-04-24 DIAGNOSIS — D492 Neoplasm of unspecified behavior of bone, soft tissue, and skin: Secondary | ICD-10-CM

## 2012-04-24 NOTE — Progress Notes (Signed)
Patient presents to the clinic today for PUT with Dr. Roselind Messier. Patient is alert and oriented to person, place, and time. No distress noted. Steady gait noted. Pleasant affect noted. Patient denies pain at this time. Patient reports that her right lower ribs are no longer painful. She reports that if she moves the wrong way occasionally she will feel a sharp shooting pain in her lower right ribs. Patient denies hyperpigmentation/skin changes to treatment area. Patient has stopped smoking but, reports eating lots of sweets. Patient has gained five pounds since last week. Patient denies painful or difficulty swallowing. Patient reports a productive cough with yellow white sputum. Patient reports a headache but, associates it with her sinuses. Patient reports occasionally she feels off balance when rising from a sitting or laying position. Reported all findings to Dr. Roselind Messier.

## 2012-04-24 NOTE — Progress Notes (Signed)
  Radiation Oncology         [phone]) [phone] ________________________________  Name: Marie Holmes MRN: [id number]  Date: 04/24/2012  DOB: Jan 31, 1936  Weekly Radiation Therapy Management  Current Dose: 20 Gy     Planned Dose:  40 Gy  Narrative . . . . . . . . The patient presents for routine under treatment assessment.                                                     The patient is without complaint.                                 Set-up films were reviewed.                                 The chart was checked. Physical Findings. . .  weight is 124 lb 9.6 oz (56.518 kg). Her blood pressure is 134/51 and her pulse is 63. Her respiration is 18 and oxygen saturation is 98%. . Weight essentially stable.  No significant changes. Occasional nausea Impression . . . . . . . The patient is  tolerating radiation. Plan . . . . . . . . . . . . Continue treatment as planned.  ________________________________   -----------------------------------  Billie Lade, PhD, MD

## 2012-04-25 ENCOUNTER — Ambulatory Visit
Admission: RE | Admit: 2012-04-25 | Discharge: 2012-04-25 | Disposition: A | Discharge status: Home / Self Care | Payer: Medicare Other | Source: Ambulatory Visit | Attending: Radiation Oncology | Admitting: Radiation Oncology

## 2012-04-26 ENCOUNTER — Ambulatory Visit
Admission: RE | Admit: 2012-04-26 | Discharge: 2012-04-26 | Disposition: A | Discharge status: Home / Self Care | Payer: Medicare Other | Source: Ambulatory Visit | Attending: Radiation Oncology | Admitting: Radiation Oncology

## 2012-04-27 ENCOUNTER — Ambulatory Visit
Admission: RE | Admit: 2012-04-27 | Discharge: 2012-04-27 | Disposition: A | Discharge status: Home / Self Care | Payer: Medicare Other | Source: Ambulatory Visit | Attending: Radiation Oncology | Admitting: Radiation Oncology

## 2012-04-30 ENCOUNTER — Ambulatory Visit
Admission: RE | Admit: 2012-04-30 | Discharge: 2012-04-30 | Disposition: A | Discharge status: Home / Self Care | Payer: Medicare Other | Source: Ambulatory Visit | Attending: Radiation Oncology | Admitting: Radiation Oncology

## 2012-05-01 ENCOUNTER — Ambulatory Visit
Admission: RE | Admit: 2012-05-01 | Discharge: 2012-05-01 | Disposition: A | Discharge status: Home / Self Care | Payer: Medicare Other | Source: Ambulatory Visit | Attending: Radiation Oncology | Admitting: Radiation Oncology

## 2012-05-01 ENCOUNTER — Encounter: Payer: Self-pay | Admitting: Radiation Oncology

## 2012-05-01 VITALS — BP 169/87 | HR 62 | Temp 97.5°F | Resp 16 | Wt 120.9 lb

## 2012-05-01 DIAGNOSIS — R51 Headache: Secondary | ICD-10-CM | POA: Insufficient documentation

## 2012-05-01 DIAGNOSIS — M25519 Pain in unspecified shoulder: Secondary | ICD-10-CM | POA: Insufficient documentation

## 2012-05-01 DIAGNOSIS — R5381 Other malaise: Secondary | ICD-10-CM | POA: Insufficient documentation

## 2012-05-01 DIAGNOSIS — R059 Cough, unspecified: Secondary | ICD-10-CM | POA: Insufficient documentation

## 2012-05-01 DIAGNOSIS — R109 Unspecified abdominal pain: Secondary | ICD-10-CM | POA: Insufficient documentation

## 2012-05-01 DIAGNOSIS — Z79899 Other long term (current) drug therapy: Secondary | ICD-10-CM | POA: Insufficient documentation

## 2012-05-01 DIAGNOSIS — R0602 Shortness of breath: Secondary | ICD-10-CM | POA: Insufficient documentation

## 2012-05-01 DIAGNOSIS — R093 Abnormal sputum: Secondary | ICD-10-CM | POA: Insufficient documentation

## 2012-05-01 DIAGNOSIS — R110 Nausea: Secondary | ICD-10-CM | POA: Insufficient documentation

## 2012-05-01 DIAGNOSIS — F411 Generalized anxiety disorder: Secondary | ICD-10-CM | POA: Insufficient documentation

## 2012-05-01 DIAGNOSIS — H5789 Other specified disorders of eye and adnexa: Secondary | ICD-10-CM | POA: Insufficient documentation

## 2012-05-01 DIAGNOSIS — G478 Other sleep disorders: Secondary | ICD-10-CM | POA: Insufficient documentation

## 2012-05-01 DIAGNOSIS — J029 Acute pharyngitis, unspecified: Secondary | ICD-10-CM | POA: Insufficient documentation

## 2012-05-01 DIAGNOSIS — R61 Generalized hyperhidrosis: Secondary | ICD-10-CM | POA: Insufficient documentation

## 2012-05-01 LAB — CBC WITH DIFFERENTIAL/PLATELET
BASO%: 0.3 % (ref 0.0–2.0)
Eosinophils Absolute: 0.3 10*3/uL (ref 0.0–0.5)
MONO#: 1 10*3/uL — ABNORMAL HIGH (ref 0.1–0.9)
MONO%: 13 % (ref 0.0–14.0)
NEUT#: 5.3 10*3/uL (ref 1.5–6.5)
RBC: 4.34 10*6/uL (ref 3.70–5.45)
RDW: 13 % (ref 11.2–14.5)
WBC: 7.7 10*3/uL (ref 3.9–10.3)

## 2012-05-01 LAB — BASIC METABOLIC PANEL (CC13)
CO2: 27 mEq/L (ref 22–29)
Glucose: 117 mg/dl — ABNORMAL HIGH (ref 70–99)
Potassium: 4.2 mEq/L (ref 3.5–5.1)
Sodium: 142 mEq/L (ref 136–145)

## 2012-05-01 MED ORDER — PROMETHAZINE HCL 25 MG PO TABS
25.0000 mg | ORAL_TABLET | Freq: Four times a day (QID) | ORAL | Status: DC | PRN
Start: 2012-05-01 — End: 2012-06-21

## 2012-05-01 NOTE — Progress Notes (Signed)
[facility]    Radiation Oncology [address] [place]     Maryln Gottron, M.D. [place], [place] [address]               Billie Lade, M.D., Ph.D. Phone: [phone]      Molli Hazard A. Kathrynn Running, M.D. Fax: [phone]      Radene Gunning, M.D., Ph.D.         Lurline Hare, M.D.         Grayland Jack, M.D Weekly Treatment Management Note  Name: Marie Holmes     MRN: [id number]        CSN: [id number] Date: 05/01/2012      DOB: 06/11/1935  CC: Marie Mannan, MD         Marie Holmes    Status: Outpatient  Diagnosis: The encounter diagnosis was Lymphoid neoplasm.  Current Dose: 30 Gy  Current Fraction: 15  Planned Dose: 40 Gy  Narrative: Marie Holmes was seen today for weekly treatment management. The chart was checked and port films  were reviewed. She is not feeling well today. She complains of nervousness. She is also noticed more problems with hot flashes and night sweats. She denies any obvious fever. She complains of a sore throat along the left side. She denies any productive sputum. She also complains of some pain in the right shoulder and right lower quadrant of the abdomen. This is also noticed some problems with nausea and I have given her a prescription for Phenergan.  Sulfonamide derivatives Current Outpatient Prescriptions  Medication Sig Dispense Refill  . albuterol (PROVENTIL HFA;VENTOLIN HFA) 108 (90 BASE) MCG/ACT inhaler Inhale 2 puffs into the lungs every 6 (six) hours as needed for wheezing.  1 Inhaler  3  . aspirin EC 81 MG tablet Take 81 mg by mouth every morning.      . citalopram (CELEXA) 40 MG tablet Take 40 mg by mouth every evening.      Marland Kitchen ibuprofen (ADVIL,MOTRIN) 200 MG tablet Take 200 mg by mouth every 8 (eight) hours as needed. For pain      . losartan (COZAAR) 50 MG tablet Take 50 mg by mouth every morning.      . metformin (FORTAMET) 1000 MG (OSM) 24 hr tablet Take 1 tablet (1,000 mg total) by mouth daily with breakfast.  30 tablet  6  .  metoprolol succinate (TOPROL-XL) 100 MG 24 hr tablet Take 100 mg by mouth every morning.      . Multiple Vitamin (MULTIVITAMIN) tablet Take 1 tablet by mouth daily at 12 noon.       . Wound Cleansers (RADIAPLEX EX) Apply topically.      . ALPRAZolam (XANAX) 0.5 MG tablet Take 0.5 mg by mouth at bedtime. For insomnia      . amoxicillin-clavulanate (AUGMENTIN) 875-125 MG per tablet Take 1 tablet by mouth 2 (two) times daily.  14 tablet  0  . promethazine (PHENERGAN) 25 MG tablet Take 1 tablet (25 mg total) by mouth every 6 (six) hours as needed for nausea.  30 tablet  0  . varenicline (CHANTIX STARTING MONTH PAK) 0.5 MG X 11 & 1 MG X 42 tablet Take one 0.5 mg tablet by mouth once daily for 3 days, then increase to one 0.5 mg tablet twice daily for 4 days, then increase to one 1 mg tablet twice daily.  53 tablet  0   No current facility-administered medications for this encounter.   Labs:  Lab Results  Component Value Date   WBC 7.7 05/01/2012   HGB 13.5 05/01/2012   HCT 39.6 05/01/2012   MCV 91.2 05/01/2012   PLT 238 05/01/2012   Lab Results  Component Value Date   CREATININE 0.8 02/28/2012   BUN 21.0 02/28/2012   NA 142 02/28/2012   K 4.0 02/28/2012   CL 104 02/28/2012   CO2 30* 02/28/2012   Lab Results  Component Value Date   ALT 10 02/28/2012   AST 12 02/28/2012   BILITOT 0.36 02/28/2012    Physical Examination:  weight is 120 lb 14.4 oz (54.84 kg). Her oral temperature is 97.5 F (36.4 C). Her blood pressure is 169/87 and her pulse is 62. Her respiration is 16 and oxygen saturation is 100%.    Wt Readings from Last 3 Encounters:  05/01/12 120 lb 14.4 oz (54.84 kg)  04/24/12 124 lb 9.6 oz (56.518 kg)  04/17/12 119 lb 8 oz (54.205 kg)   Patient has some ulceration along the left tonsillar area consistent with a viral infection. I should mention patient was on  antibiotics for sinusitis early during the course of her treatment. There is no secondary infection noted in the oral cavity. She  complains of some soreness in the left upper neck region. There's a  question the palpable lymph node in this area.  Lungs - Normal respiratory effort, chest expands symmetrically. Lungs are clear to auscultation, no crackles or wheezes.  Heart has regular rhythm and rate  Abdomen is soft and non tender with normal bowel sounds  Assessment:  Patient tolerating treatments well.  Am unsure of the etiology of her constellation of problems at this time. She did undergo routine blood work today on her way out the clinic.  Plan: Continue treatment per original radiation prescription

## 2012-05-01 NOTE — Progress Notes (Signed)
Marie Holmes presents to the clinic today unaccompanied for PUT with Dr. Roselind Messier. Marie Holmes alert and oriented to person, place, and time. No distress noted. Steady gait noted. Pleasant affect noted. Marie Holmes reports right shoulder and right groin pain 6 on scale of 0-10. Marie Holmes denies taking any pain medication this morning. Marie Holmes reports that her "nerves have gotten the best of her since Friday." Marie Holmes reports difficulty sleeping due to anxiety and night sweats. Marie Holmes reports that she is out of her xanax. Marie Holmes reports that she has started back smoking. Marie Holmes reports persistent nausea but, denies emesis. Marie Holmes reports that she would now like a prescription for phenergan. Blood pressure elevated. Marie Holmes reports that she took her bp medication this morning. Marie Holmes reports a headache. Marie Holmes reports that her left eye was matted shut this monring when she awoke. Marie Holmes reports fatigue. Marie Holmes reports occasional shortness of breath. Marie Holmes denies pain or difficulty swallowing however, Marie Holmes reports the left side of her throat is sore. Marie Holmes reports that mostly her cough is dry but, occasionally it is productive with yellow to green sputum. Reported all findings to Dr. Roselind Messier.

## 2012-05-02 ENCOUNTER — Telehealth: Payer: Self-pay | Admitting: Radiation Oncology

## 2012-05-02 ENCOUNTER — Ambulatory Visit
Admission: RE | Admit: 2012-05-02 | Discharge: 2012-05-02 | Disposition: A | Discharge status: Home / Self Care | Payer: Medicare Other | Source: Ambulatory Visit | Attending: Radiation Oncology | Admitting: Radiation Oncology

## 2012-05-02 ENCOUNTER — Ambulatory Visit: Payer: Medicare Other | Admitting: Cardiothoracic Surgery

## 2012-05-02 NOTE — Telephone Encounter (Signed)
Per Dr. Trina Ao request phone patient this morning to informing her that her lab results from yesterday are normal. Patient reports that she check her bp this morning at home and it was 150/70. Informed patient while this value was an improvement from yesterday her bp is still high. Encouraged patient to check bp twice daily and record values. Also, encouraged patient to contact PCP to make a follow up appointment to discuss elevated bp. Patient verbalized understanding and expressed appreciation for the call.

## 2012-05-03 ENCOUNTER — Ambulatory Visit
Admission: RE | Admit: 2012-05-03 | Discharge: 2012-05-03 | Disposition: A | Discharge status: Home / Self Care | Payer: Medicare Other | Source: Ambulatory Visit | Attending: Radiation Oncology | Admitting: Radiation Oncology

## 2012-05-04 ENCOUNTER — Ambulatory Visit
Admission: RE | Admit: 2012-05-04 | Discharge: 2012-05-04 | Disposition: A | Discharge status: Home / Self Care | Payer: Medicare Other | Source: Ambulatory Visit | Attending: Radiation Oncology | Admitting: Radiation Oncology

## 2012-05-07 ENCOUNTER — Ambulatory Visit
Admission: RE | Admit: 2012-05-07 | Discharge: 2012-05-07 | Disposition: A | Discharge status: Home / Self Care | Payer: Medicare Other | Source: Ambulatory Visit | Attending: Radiation Oncology | Admitting: Radiation Oncology

## 2012-05-08 ENCOUNTER — Encounter: Payer: Self-pay | Admitting: Radiation Oncology

## 2012-05-08 ENCOUNTER — Ambulatory Visit
Admission: RE | Admit: 2012-05-08 | Discharge: 2012-05-08 | Disposition: A | Discharge status: Home / Self Care | Payer: Medicare Other | Source: Ambulatory Visit | Attending: Radiation Oncology | Admitting: Radiation Oncology

## 2012-05-08 VITALS — BP 144/64 | HR 64 | Resp 18 | Wt 122.8 lb

## 2012-05-08 MED ORDER — RADIAPLEXRX EX GEL
Freq: Once | CUTANEOUS | Status: AC
  Administered 2012-05-08: 17:00:00 via TOPICAL

## 2012-05-08 NOTE — Progress Notes (Signed)
[facility]    Radiation Oncology [address] [place]     Maryln Gottron, M.D. [place], [place] [address]               Billie Lade, M.D., Ph.D. Phone: [phone]      Molli Hazard A. Kathrynn Running, M.D. Fax: [phone]      Radene Gunning, M.D., Ph.D.         Lurline Hare, M.D.         Grayland Jack, M.D Weekly Treatment Management Note  Name: Marie Holmes     MRN: [id number]        CSN: [id number] Date: 05/08/2012      DOB: 1935-08-11  CC: Ruthe Mannan, MD         Dayton Martes    Status: Outpatient  Diagnosis: The encounter diagnosis was Lymphoid neoplasm.  Current Dose: 40 Gy  Current Fraction: 20  Planned Dose: 40 Gy  Narrative: Marie Holmes was seen today for weekly treatment management. The chart was checked and port films  were reviewed. She is doing better this week. She has less anxiety. She denies any significant cough or breathing problems. She does have some fatigue related to her treatment. She denies any itching or discomfort along the left chest wall area.  Sulfonamide derivatives  Current Outpatient Prescriptions  Medication Sig Dispense Refill  . albuterol (PROVENTIL HFA;VENTOLIN HFA) 108 (90 BASE) MCG/ACT inhaler Inhale 2 puffs into the lungs every 6 (six) hours as needed for wheezing.  1 Inhaler  3  . ALPRAZolam (XANAX) 0.5 MG tablet Take 0.5 mg by mouth at bedtime. For insomnia      . amoxicillin-clavulanate (AUGMENTIN) 875-125 MG per tablet Take 1 tablet by mouth 2 (two) times daily.  14 tablet  0  . aspirin EC 81 MG tablet Take 81 mg by mouth every morning.      . citalopram (CELEXA) 40 MG tablet Take 40 mg by mouth every evening.      Marland Kitchen ibuprofen (ADVIL,MOTRIN) 200 MG tablet Take 200 mg by mouth every 8 (eight) hours as needed. For pain      . losartan (COZAAR) 50 MG tablet Take 50 mg by mouth every morning.      . metformin (FORTAMET) 1000 MG (OSM) 24 hr tablet Take 1 tablet (1,000 mg total) by mouth daily with breakfast.  30 tablet  6  .  metoprolol succinate (TOPROL-XL) 100 MG 24 hr tablet Take 100 mg by mouth every morning.      . Multiple Vitamin (MULTIVITAMIN) tablet Take 1 tablet by mouth daily at 12 noon.       . promethazine (PHENERGAN) 25 MG tablet Take 1 tablet (25 mg total) by mouth every 6 (six) hours as needed for nausea.  30 tablet  0  . varenicline (CHANTIX STARTING MONTH PAK) 0.5 MG X 11 & 1 MG X 42 tablet Take one 0.5 mg tablet by mouth once daily for 3 days, then increase to one 0.5 mg tablet twice daily for 4 days, then increase to one 1 mg tablet twice daily.  53 tablet  0  . Wound Cleansers (RADIAPLEX EX) Apply topically.       No current facility-administered medications for this encounter.   Labs:  Lab Results  Component Value Date   WBC 7.7 05/01/2012   HGB 13.5 05/01/2012   HCT 39.6 05/01/2012   MCV 91.2 05/01/2012   PLT 238 05/01/2012   Lab Results  Component Value  Date   CREATININE 0.8 05/01/2012   BUN 15.4 05/01/2012   NA 142 05/01/2012   K 4.2 05/01/2012   CL 106 05/01/2012   CO2 27 05/01/2012   Lab Results  Component Value Date   ALT 10 02/28/2012   AST 12 02/28/2012   BILITOT 0.36 02/28/2012    Physical Examination:  weight is 122 lb 12.8 oz (55.702 kg). Her blood pressure is 144/64 and her pulse is 64. Her respiration is 18 and oxygen saturation is 97%.    Wt Readings from Last 3 Encounters:  05/08/12 122 lb 12.8 oz (55.702 kg)  05/01/12 120 lb 14.4 oz (54.84 kg)  04/24/12 124 lb 9.6 oz (56.518 kg)     Lungs - Normal respiratory effort, chest expands symmetrically. Lungs are clear to auscultation, no crackles or wheezes.  Heart has regular rhythm and rate  Abdomen is soft and non tender with normal bowel sounds  Assessment:  Patient tolerated treatments reasonably well  Plan: She will be scheduled for followup in one month.

## 2012-05-08 NOTE — Addendum Note (Signed)
Encounter addended by: Agnes Lawrence, RN on: 05/08/2012  5:11 PM<BR>     Documentation filed: Inpatient MAR, Orders

## 2012-05-08 NOTE — Progress Notes (Signed)
Patient presents to the clinic today accompanied by her husband for a PUT with Dr. Roselind Messier. Patient alert and oriented to person, place, and time. No distress noted. Steady gait noted. Pleasant affect noted. Patient denies pain at this time.   Patient reports that her nerves have improved. Patient reports that she believes the chantix was affecting her nerves and she has stopped taking it. Patient denies difficulty sleeping. Patient reports night sweats continues. Patient reports occasional nausea but, denies emesis.Patient reports fatigue. Patient reports occasional shortness of breath. Patient denies pain or difficulty swallowing however, patient reports the left side of her throat is sore. Patient reports that mostly her cough is dry but, occasionally it is productive with yellow to green sputum. Provided patient with appointment card for one month follow up. Also, provided patient with radiaplex and encouraged her to use it for next two weeks. Only faint hyperpigmentation of upper back noted. Reported all findings to Dr. Roselind Messier.

## 2012-05-09 ENCOUNTER — Encounter: Payer: Self-pay | Admitting: Radiation Oncology

## 2012-05-09 ENCOUNTER — Encounter: Payer: Self-pay | Admitting: Family Medicine

## 2012-05-09 ENCOUNTER — Ambulatory Visit (INDEPENDENT_AMBULATORY_CARE_PROVIDER_SITE_OTHER): Payer: Medicare Other | Admitting: Family Medicine

## 2012-05-09 ENCOUNTER — Ambulatory Visit: Payer: Medicare Other

## 2012-05-09 VITALS — BP 152/82 | HR 74 | Temp 98.4°F | Wt 121.0 lb

## 2012-05-09 DIAGNOSIS — I10 Essential (primary) hypertension: Secondary | ICD-10-CM

## 2012-05-09 DIAGNOSIS — J322 Chronic ethmoidal sinusitis: Secondary | ICD-10-CM | POA: Insufficient documentation

## 2012-05-09 MED ORDER — AMOXICILLIN-POT CLAVULANATE 875-125 MG PO TABS: 1.0000 | ORAL_TABLET | Freq: Two times a day (BID) | ORAL | Status: DC

## 2012-05-09 NOTE — Patient Instructions (Addendum)
Please check your blood pressure every day for the next week and call me with your readings.

## 2012-05-09 NOTE — Progress Notes (Signed)
Subjective:    Patient ID: Marie Holmes, female    DOB: 04-29-1935, 77 y.o.   MRN: [id number]  HPI 77 yo very pleasant female with h/o DM, HTN and recent diagnosis of early lymphoid CA here for follow up blood pressures.    Notes reviewed from Dr. Roselind Messier, rad onc. Just finished last radiation tx.   They have been concerned that her BP has been elevated at radiation tx.  She is going back in April to for radiation follow up and CT scan of chest, abdomen and pelvis.  BP Readings from Last 3 Encounters:  05/08/12 144/64  05/01/12 169/87  04/24/12 134/51   On Toprol 100 mg XL.   She denies any HA, CP, blurred vision, SOB or LE edema currently but when it was elevated in 170s/80s, she did have a headache.      Sinusitis- Dr. Darleene Cleaver treated her for sinusitis a few weeks ago with Augmentin.  Symptoms improved but now having left facial pain.    No fevers.  She is fatigued but thinks this is due to radiation tx.   Patient Active Problem List  Diagnosis  . COLONIC POLYPS  . DIABETES MELLITUS, TYPE II  . HYPERLIPIDEMIA  . ANEMIA, IRON DEFICIENCY, UNSPEC.  Marland Kitchen ANXIETY  . HYPERTENSION  . VENOUS INSUFFICIENCY, CHRONIC  . COPD  . GASTROESOPHAGEAL REFLUX, NO ESOPHAGITIS  . IRRITABLE BOWEL SYNDROME  . OSTEOARTHRITIS, MULTI SITES  . Osteoporosis, unspecified  . INSOMNIA NOS  . INCONTINENCE, URGE  . Weight loss  . Lymphoid neoplasm   Past Medical History  Diagnosis Date  . Benign tumor of breast 2004    left  . Colon polyp   . Broken ribs     history of several broken ribs on right and compression  fx  . Fibroma of skin     Multiple fibromas/lipomas on arms and legs B  . Pyelonephritis   . Anxiety   . COPD (chronic obstructive pulmonary disease)   . Hyperlipidemia   . Osteoarthritis   . History of CT scan of abdomen 05/23/2003    Mild fatty liver inf, tiny hypodensity in right lobe of liver  . Kidney stone 02/21/1981    Right/removed  . Pulmonary nodules   . Diabetes  mellitus without complication   . Cancer     non-hodgkins b cell lymphoma  . 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 Appendectomy  02/21/1961  . Exploratory laparotomy  02/21/1970    for ? adhesions  . Abdominal hysterectomy  02/21/1970    right ovary remaining  . Bone marrow biopsy  03/09/2012  . Lung core biopsy  02/06/2012   History  Substance Use Topics  . Smoking status: Current Every Day Smoker -- 0.50 packs/day for 30 years    Types: Cigarettes  . Smokeless tobacco: Never Used     Comment: 3/4 pack X 50 years  . Alcohol Use: No   Family History  Problem Relation Age of Onset  . Parkinsonism Brother   . Stroke Father   . Cancer Sister    Allergies  Allergen Reactions  . Sulfonamide Derivatives Swelling and Rash    Around the mouth swelling   Current Outpatient Prescriptions on File Prior to Visit  Medication Sig Dispense Refill  . albuterol (PROVENTIL HFA;VENTOLIN HFA) 108 (90 BASE) MCG/ACT inhaler Inhale 2 puffs into the lungs every 6 (six) hours as  needed for wheezing.  1 Inhaler  3  . ALPRAZolam (XANAX) 0.5 MG tablet Take 0.5 mg by mouth at bedtime. For insomnia      . amoxicillin-clavulanate (AUGMENTIN) 875-125 MG per tablet Take 1 tablet by mouth 2 (two) times daily.  14 tablet  0  . aspirin EC 81 MG tablet Take 81 mg by mouth every morning.      . citalopram (CELEXA) 40 MG tablet Take 40 mg by mouth every evening.      Marland Kitchen ibuprofen (ADVIL,MOTRIN) 200 MG tablet Take 200 mg by mouth every 8 (eight) hours as needed. For pain      . losartan (COZAAR) 50 MG tablet Take 50 mg by mouth every morning.      . metformin (FORTAMET) 1000 MG (OSM) 24 hr tablet Take 1 tablet (1,000 mg total) by mouth daily with breakfast.  30 tablet  6  . metoprolol succinate (TOPROL-XL) 100 MG 24 hr tablet Take 100 mg by mouth every morning.      . Multiple Vitamin (MULTIVITAMIN) tablet Take 1  tablet by mouth daily at 12 noon.       . promethazine (PHENERGAN) 25 MG tablet Take 1 tablet (25 mg total) by mouth every 6 (six) hours as needed for nausea.  30 tablet  0  . varenicline (CHANTIX STARTING MONTH PAK) 0.5 MG X 11 & 1 MG X 42 tablet Take one 0.5 mg tablet by mouth once daily for 3 days, then increase to one 0.5 mg tablet twice daily for 4 days, then increase to one 1 mg tablet twice daily.  53 tablet  0  . Wound Cleansers (RADIAPLEX EX) Apply topically.       No current facility-administered medications on file prior to visit.   The PMH, PSH, Social History, Family History, Medications, and allergies have been reviewed in [facility], and have been updated if relevant.   Review of Systems  See HPI     Objective:   Physical Exam  BP 152/82  Pulse 74  Temp(Src) 98.4 F (36.9 C)  Wt 121 lb (54.885 kg)  BMI 22.87 kg/m2  SpO2 92%  Constitutional: Vital signs are normal. She appears well-developed and well-nourished. She is cooperative.  Non-toxic appearance. She does not appear ill. No distress.  HENT:  +boggy turbinates left with left sinuses TTP Head: Normocephalic.  Cardiovascular: Normal rate, regular rhythm, S1 normal, S2 normal, normal heart sounds, intact distal pulses and normal pulses.  Exam reveals no gallop and no friction rub.   No murmur heard. Pulmonary/Chest: Effort normal and breath sounds normal. Not tachypneic. No respiratory distress. She has no decreased breath sounds. She has no wheezes. She has no rhonchi. She has no rales.  Abdominal: Soft. Normal appearance and bowel sounds are normal. There is no tenderness.  Neurological: She is alert.  Skin: Skin is warm, dry and intact. No rash noted.  Psychiatric: Her speech is normal and behavior is normal. Judgment and thought content normal. Her mood appears not anxious. Cognition and memory are normal. She does not exhibit a depressed mood.      Assessment & Plan:   1. HYPERTENSION Deteriorated but has been  normotensive several times this month.  I suspect this is due to her recovering from her recent tx and I would prefer not to increase her blood pressure medication to avoid hypotension.  She is very reliable and has a BP cuff at home.  She will check daily BP and call me with readings.  The patient indicates understanding of these issues and agrees with the plan.   2. Lymphoid neoplasm S/p radiation tx.  Has follow up scheduled.  3. Ethmoid sinusitis Deteriorated.  Given duration and progression of symptoms, will treat for bacterial sinusitis with Augmentin. Call or return to clinic prn if these symptoms worsen or fail to improve as anticipated. The patient indicates understanding of these issues and agrees with the plan.

## 2012-05-09 NOTE — Progress Notes (Signed)
  Radiation Oncology         [phone]) [phone] ________________________________  Name: Marie Holmes MRN: [id number]  Date: 05/09/2012  DOB: 10/06/1935  End of Treatment Note  Diagnosis:   Probable B-cell Non-Hodgkins Lymphoma, Stage I     Indication for treatment:  Definitive treatment       Radiation treatment dates:   2/19 - 05/08/12  Site/dose:   Left chest wall area, 40 Gy in 20 fractions  Beams/energy:   3-D conformal using 3 fields, AP/PA/left lateral  Narrative: The patient tolerated radiation treatment relatively well.   She did have anxiety early on with her treatment. She also experienced fatigue.  Plan: The patient has completed radiation treatment. The patient will return to radiation oncology clinic for routine followup in one month. I advised them to call or return sooner if they have any questions or concerns related to their recovery or treatment.  -----------------------------------  Billie Lade, PhD, MD

## 2012-05-10 ENCOUNTER — Ambulatory Visit: Payer: Medicare Other

## 2012-05-11 ENCOUNTER — Ambulatory Visit: Payer: Medicare Other

## 2012-05-14 ENCOUNTER — Ambulatory Visit: Payer: Medicare Other

## 2012-05-15 ENCOUNTER — Ambulatory Visit: Payer: Medicare Other

## 2012-05-16 ENCOUNTER — Encounter: Payer: Self-pay | Admitting: Radiation Oncology

## 2012-05-16 ENCOUNTER — Ambulatory Visit: Payer: Medicare Other

## 2012-05-16 NOTE — Progress Notes (Signed)
  Radiation Oncology         [phone]) [phone] ________________________________  Name: [name] MRN: [id number]  Date: 05/16/2012  DOB: 06-18-1935  Simulation Verification Note- performed on 04/10/2012  Status: outpatient  NARRATIVE: The patient was brought to the treatment unit and placed in the planned treatment position. The clinical setup was verified. Then port films were obtained and uploaded to the radiation oncology medical record software.  The treatment beams were carefully compared against the planned radiation fields. The position location and shape of the radiation fields was reviewed. They targeted volume of tissue appears to be appropriately covered by the radiation beams. Organs at risk appear to be excluded as planned.  Based on my personal review, I approved the simulation verification. The patient's treatment will proceed as planned.  -----------------------------------  Billie Lade, PhD, MD

## 2012-05-17 ENCOUNTER — Ambulatory Visit: Payer: Medicare Other

## 2012-05-18 ENCOUNTER — Ambulatory Visit: Payer: Medicare Other

## 2012-05-21 ENCOUNTER — Ambulatory Visit: Payer: Medicare Other

## 2012-05-22 ENCOUNTER — Ambulatory Visit: Payer: Medicare Other

## 2012-06-05 ENCOUNTER — Other Ambulatory Visit: Payer: Self-pay

## 2012-06-05 MED ORDER — LOSARTAN POTASSIUM 50 MG PO TABS: 50.0000 mg | ORAL_TABLET | Freq: Every morning | ORAL | Status: DC

## 2012-06-05 MED ORDER — ALPRAZOLAM 0.5 MG PO TABS: 0.5000 mg | ORAL_TABLET | Freq: Every day | ORAL | Status: DC

## 2012-06-05 MED ORDER — METFORMIN HCL 1000 MG PO TABS: 1000.0000 mg | ORAL_TABLET | Freq: Two times a day (BID) | ORAL | Status: DC

## 2012-06-05 MED ORDER — METOPROLOL SUCCINATE ER 100 MG PO TB24: 100.0000 mg | ORAL_TABLET | Freq: Every morning | ORAL | Status: DC

## 2012-06-05 NOTE — Telephone Encounter (Signed)
Advised patient, metformin sent to [facility] [place]. Alprazolam called to [facility].

## 2012-06-05 NOTE — Telephone Encounter (Signed)
Yes ok to increase metformin to 1000 mg twice daily.

## 2012-06-05 NOTE — Telephone Encounter (Signed)
Pt request refill alprazolam,losartan and metoprolol 100 mg 24 hr tab to [facility]. Pt said Metformin 1000 mg 24 hr tab is not bringing BS down. FBS averages 150; pt request to go back on metformin 1000 mg twice a day.Please advise.

## 2012-06-05 NOTE — Telephone Encounter (Signed)
Left message asking patient to call back

## 2012-06-14 ENCOUNTER — Ambulatory Visit
Admission: RE | Admit: 2012-06-14 | Discharge: 2012-06-14 | Disposition: A | Discharge status: Home / Self Care | Payer: Medicare Other | Source: Ambulatory Visit | Attending: Radiation Oncology | Admitting: Radiation Oncology

## 2012-06-14 ENCOUNTER — Encounter: Payer: Self-pay | Admitting: Radiation Oncology

## 2012-06-14 VITALS — BP 120/57 | HR 68 | Temp 97.5°F | Resp 18 | Wt 119.5 lb

## 2012-06-14 DIAGNOSIS — D492 Neoplasm of unspecified behavior of bone, soft tissue, and skin: Secondary | ICD-10-CM

## 2012-06-14 NOTE — Progress Notes (Signed)
Radiation Oncology         [phone]) [phone] ________________________________  Name: Marie Holmes MRN: [id number]  Date: 06/14/2012  DOB: Jul 16, 1935  Follow-Up Visit Note  CC: Ruthe Mannan, MD  Si Gaul, MD  Diagnosis:   Probable B cell non-Hodgkin's lymphoma, stage I  Interval Since Last Radiation:  6  weeks  Narrative:  The patient returns today for routine follow-up.  She is doing reasonably well at this time.  She has mild fatigue. She denies any cough or breathing problems. She's had some mild soreness along the skin of the treatment area and left breast region.  Unfortunately her husband being evaluated by hospice for progressive emphysema and breathing issues.                              ALLERGIES:  is allergic to chantix and sulfonamide derivatives.  Meds: Current Outpatient Prescriptions  Medication Sig Dispense Refill  . albuterol (PROVENTIL HFA;VENTOLIN HFA) 108 (90 BASE) MCG/ACT inhaler Inhale 2 puffs into the lungs every 6 (six) hours as needed for wheezing.  1 Inhaler  3  . ALPRAZolam (XANAX) 0.5 MG tablet Take 1 tablet (0.5 mg total) by mouth at bedtime. For insomnia  30 tablet  0  . aspirin EC 81 MG tablet Take 81 mg by mouth every morning.      . citalopram (CELEXA) 40 MG tablet Take 40 mg by mouth every evening.      Marland Kitchen ibuprofen (ADVIL,MOTRIN) 200 MG tablet Take 200 mg by mouth every 8 (eight) hours as needed. For pain      . losartan (COZAAR) 50 MG tablet Take 1 tablet (50 mg total) by mouth every morning.  30 tablet  6  . metFORMIN (GLUCOPHAGE) 1000 MG tablet Take 1 tablet (1,000 mg total) by mouth 2 (two) times daily with a meal.  180 tablet  1  . metoprolol succinate (TOPROL-XL) 100 MG 24 hr tablet Take 1 tablet (100 mg total) by mouth every morning.  30 tablet  6  . Multiple Vitamin (MULTIVITAMIN) tablet Take 1 tablet by mouth daily at 12 noon.       . promethazine (PHENERGAN) 25 MG tablet Take 1 tablet (25 mg total) by mouth every 6 (six) hours as needed for  nausea.  30 tablet  0  . amoxicillin-clavulanate (AUGMENTIN) 875-125 MG per tablet Take 1 tablet by mouth 2 (two) times daily.  20 tablet  0  . Wound Cleansers (RADIAPLEX EX) Apply topically.       No current facility-administered medications for this encounter.    Physical Findings: The patient is in no acute distress. Patient is alert and oriented.  weight is 119 lb 8 oz (54.205 kg). Her oral temperature is 97.5 F (36.4 C). Her blood pressure is 120/57 and her pulse is 68. Her respiration is 18 and oxygen saturation is 97%. .  No palpable supraclavicular or axillary adenopathy. The lungs are clear to auscultation. The heart has a regular rhythm and rate. Examination of the left breast reveals no mass or nipple discharge. There is mild radiation changes noted along the lateral aspect of the breast and left lateral rib cage area  Lab Findings: Lab Results  Component Value Date   WBC 7.7 05/01/2012   HGB 13.5 05/01/2012   HCT 39.6 05/01/2012   MCV 91.2 05/01/2012   PLT 238 05/01/2012      Radiographic Findings: No results found.  Impression:  The patient is recovering from the effects of radiation.    Plan:  Routine followup in 3 months.  The patient will undergo CT scan of the body April 30 and then followup with Dr. Arbutus Ped on may 1.  _____________________________________  -----------------------------------  Billie Lade, PhD, MD

## 2012-06-14 NOTE — Progress Notes (Signed)
Patient presents to the clinic today unaccompanied for follow up with Dr. Roselind Messier. Patient alert and oriented to person, place, and time. No distress noted. Steady gait noted. Pleasant affect noted. Patient denies pain at this time. However, patient does reports occasional left axilla and left breast pain. Patient reports that her husband was admitted to hospice this week. Patient has been caring for him in their home since his discharge. Patient reports fatigue associated with caring for her husband. Patient reports an occasional productive cough with white sputum. Patient denies shortness of breath. Patient reports persistent nausea mostly before bed. Patient reports that she vomitted a large amount last week. Patient reports diarrhea associated with metformin use. Patient reports an occasional slight headache. Patient denies diplopia, dizziness or ringing of the ears. Skin of chest/treatment area normal color and appearance. Patient reports a good appetite. Patient reports continued difficulty sleeping. Reported all findings to Dr. Roselind Messier.

## 2012-06-19 ENCOUNTER — Other Ambulatory Visit (HOSPITAL_BASED_OUTPATIENT_CLINIC_OR_DEPARTMENT_OTHER): Payer: Medicare Other | Admitting: Lab

## 2012-06-19 DIAGNOSIS — D492 Neoplasm of unspecified behavior of bone, soft tissue, and skin: Secondary | ICD-10-CM

## 2012-06-19 DIAGNOSIS — D47Z9 Other specified neoplasms of uncertain behavior of lymphoid, hematopoietic and related tissue: Secondary | ICD-10-CM

## 2012-06-19 DIAGNOSIS — I10 Essential (primary) hypertension: Secondary | ICD-10-CM

## 2012-06-19 LAB — CBC WITH DIFFERENTIAL/PLATELET
BASO%: 0.3 % (ref 0.0–2.0)
Basophils Absolute: 0 10*3/uL (ref 0.0–0.1)
Eosinophils Absolute: 0.2 10*3/uL (ref 0.0–0.5)
HCT: 41.7 % (ref 34.8–46.6)
HGB: 13.8 g/dL (ref 11.6–15.9)
LYMPH%: 15.7 % (ref 14.0–49.7)
MCHC: 33 g/dL (ref 31.5–36.0)
MONO#: 0.8 10*3/uL (ref 0.1–0.9)
NEUT%: 70.3 % (ref 38.4–76.8)
Platelets: 239 10*3/uL (ref 145–400)
WBC: 7.2 10*3/uL (ref 3.9–10.3)

## 2012-06-19 LAB — COMPREHENSIVE METABOLIC PANEL (CC13)
BUN: 16.5 mg/dL (ref 7.0–26.0)
CO2: 28 mEq/L (ref 22–29)
Calcium: 9.8 mg/dL (ref 8.4–10.4)
Chloride: 103 mEq/L (ref 98–107)
Creatinine: 0.8 mg/dL (ref 0.6–1.1)
Glucose: 111 mg/dl — ABNORMAL HIGH (ref 70–99)
Total Bilirubin: 0.62 mg/dL (ref 0.20–1.20)

## 2012-06-20 ENCOUNTER — Ambulatory Visit (HOSPITAL_COMMUNITY)
Admission: RE | Admit: 2012-06-20 | Discharge: 2012-06-20 | Disposition: A | Discharge status: Home / Self Care | Payer: Medicare Other | Source: Ambulatory Visit | Attending: Internal Medicine | Admitting: Internal Medicine

## 2012-06-20 DIAGNOSIS — D492 Neoplasm of unspecified behavior of bone, soft tissue, and skin: Secondary | ICD-10-CM

## 2012-06-20 DIAGNOSIS — I2510 Atherosclerotic heart disease of native coronary artery without angina pectoris: Secondary | ICD-10-CM | POA: Insufficient documentation

## 2012-06-20 DIAGNOSIS — J438 Other emphysema: Secondary | ICD-10-CM | POA: Insufficient documentation

## 2012-06-20 DIAGNOSIS — Z923 Personal history of irradiation: Secondary | ICD-10-CM | POA: Insufficient documentation

## 2012-06-20 DIAGNOSIS — N289 Disorder of kidney and ureter, unspecified: Secondary | ICD-10-CM | POA: Insufficient documentation

## 2012-06-20 DIAGNOSIS — Z90710 Acquired absence of both cervix and uterus: Secondary | ICD-10-CM | POA: Insufficient documentation

## 2012-06-20 DIAGNOSIS — Z9089 Acquired absence of other organs: Secondary | ICD-10-CM | POA: Insufficient documentation

## 2012-06-20 DIAGNOSIS — C3490 Malignant neoplasm of unspecified part of unspecified bronchus or lung: Secondary | ICD-10-CM | POA: Insufficient documentation

## 2012-06-20 DIAGNOSIS — Z87442 Personal history of urinary calculi: Secondary | ICD-10-CM | POA: Insufficient documentation

## 2012-06-20 MED ORDER — IOHEXOL 300 MG/ML  SOLN
100.0000 mL | Freq: Once | INTRAMUSCULAR | Status: AC | PRN
  Administered 2012-06-20: 100 mL via INTRAVENOUS

## 2012-06-21 ENCOUNTER — Telehealth: Payer: Self-pay | Admitting: Internal Medicine

## 2012-06-21 ENCOUNTER — Encounter: Payer: Self-pay | Admitting: Internal Medicine

## 2012-06-21 ENCOUNTER — Ambulatory Visit (HOSPITAL_BASED_OUTPATIENT_CLINIC_OR_DEPARTMENT_OTHER): Payer: Medicare Other | Admitting: Internal Medicine

## 2012-06-21 VITALS — BP 143/73 | HR 74 | Temp 96.9°F | Resp 20 | Ht 61.0 in | Wt 117.6 lb

## 2012-06-21 DIAGNOSIS — D47Z9 Other specified neoplasms of uncertain behavior of lymphoid, hematopoietic and related tissue: Secondary | ICD-10-CM

## 2012-06-21 DIAGNOSIS — D492 Neoplasm of unspecified behavior of bone, soft tissue, and skin: Secondary | ICD-10-CM

## 2012-06-21 NOTE — Progress Notes (Signed)
[facility] [phone]   Fax:([phone]  OFFICE PROGRESS NOTE  Ruthe Mannan, MD [address], [place] [address]  DIAGNOSIS: Pulmonary lymphoid neoplasm questionable for non-Hodgkin lymphoma   PRIOR THERAPY:  Definitive radiotherapy to the left lower lobe lung mass under the care of Dr. Roselind Messier completed on 05/08/2012.  CURRENT THERAPY: Observation  INTERVAL HISTORY: Marie Holmes 77 y.o. female returns to the clinic today for followup visit accompanied her daughter and granddaughter. The patient is feeling fine today with no specific complaints. She denied having any significant chest pain but continues to have mild cough, no shortness of breath or hemoptysis. She has no significant weight loss or night sweats. The patient completed a course of definitive radiotherapy to the left lower lobe lung mass under the care of Dr. Roselind Messier and she tolerated it fairly well. She has repeat CT scan of the chest, abdomen and pelvis for evaluation of her disease and she is here for discussion of her scan results and recommendation regarding her condition.  MEDICAL HISTORY: Past Medical History  Diagnosis Date  . Benign tumor of breast 2004    left  . Colon polyp   . Broken ribs     history of several broken ribs on right and compression  fx  . Fibroma of skin     Multiple fibromas/lipomas on arms and legs B  . Pyelonephritis   . Anxiety   . COPD (chronic obstructive pulmonary disease)   . Hyperlipidemia   . Osteoarthritis   . History of CT scan of abdomen 05/23/2003    Mild fatty liver inf, tiny hypodensity in right lobe of liver  . Kidney stone 02/21/1981    Right/removed  . Pulmonary nodules   . Diabetes mellitus without complication   . Cancer     non-hodgkins b cell lymphoma  . 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  MEDICATIONS:  Current Outpatient Prescriptions  Medication Sig Dispense Refill  . ALPRAZolam (XANAX) 0.5 MG tablet Take 1 tablet (0.5 mg total) by mouth at bedtime. For insomnia  30 tablet  0  . aspirin EC 81 MG tablet Take 81 mg by mouth every morning.      . citalopram (CELEXA) 40 MG tablet Take 40 mg by mouth every evening.      Marland Kitchen ibuprofen (ADVIL,MOTRIN) 200 MG tablet Take 200 mg by mouth every 8 (eight) hours as needed. For pain      . losartan (COZAAR) 50 MG tablet Take 1 tablet (50 mg total) by mouth every morning.  30 tablet  6  . metFORMIN (GLUCOPHAGE) 1000 MG tablet Take 1 tablet (1,000 mg total) by mouth 2 (two) times daily with a meal.  180 tablet  1  . metoprolol succinate (TOPROL-XL) 100 MG 24 hr tablet Take 1 tablet (100 mg total) by mouth every morning.  30 tablet  6  . Multiple Vitamin (MULTIVITAMIN) tablet Take 1 tablet by mouth daily at 12 noon.       Marland Kitchen albuterol (PROVENTIL HFA;VENTOLIN HFA) 108 (90 BASE) MCG/ACT inhaler Inhale 2 puffs into the lungs every 6 (six) hours as needed for wheezing.  1 Inhaler  3   No current facility-administered medications for this visit.    SURGICAL HISTORY:  Past Surgical History  Procedure Laterality Date  . Appendectomy  02/21/1961  . Exploratory laparotomy  02/21/1970    for ? adhesions  . Abdominal hysterectomy  02/21/1970    right ovary remaining  . Bone marrow biopsy  03/09/2012  . Lung core biopsy  02/06/2012    REVIEW OF SYSTEMS:  A comprehensive review of systems was negative.   PHYSICAL EXAMINATION: General appearance: alert, cooperative and no distress Head: Normocephalic, without obvious abnormality, atraumatic Neck: no adenopathy Lymph nodes: Cervical, supraclavicular, and axillary nodes normal. Resp: clear to auscultation bilaterally Cardio: regular rate and rhythm, S1, S2 normal, no murmur, click, rub or gallop GI: soft, non-tender; bowel sounds normal; no masses,  no organomegaly Extremities:  extremities normal, atraumatic, no cyanosis or edema  ECOG PERFORMANCE STATUS: 0 - Asymptomatic  Blood pressure 143/73, pulse 74, temperature 96.9 F (36.1 C), temperature source Oral, resp. rate 20, height 5\' 1"  (1.549 m), weight 117 lb 9.6 oz (53.343 kg).  LABORATORY DATA: Lab Results  Component Value Date   WBC 7.2 06/19/2012   HGB 13.8 06/19/2012   HCT 41.7 06/19/2012   MCV 91.3 06/19/2012   PLT 239 06/19/2012      Chemistry      Component Value Date/Time   NA 141 06/19/2012 0931   NA 138 09/27/2011 1017   K 4.1 06/19/2012 0931   K 4.1 09/27/2011 1017   CL 103 06/19/2012 0931   CL 101 09/27/2011 1017   CO2 28 06/19/2012 0931   CO2 29 09/27/2011 1017   BUN 16.5 06/19/2012 0931   BUN 11 01/16/2012 1633   CREATININE 0.8 06/19/2012 0931   CREATININE 0.73 01/16/2012 1633   CREATININE 0.8 09/27/2011 1017      Component Value Date/Time   CALCIUM 9.8 06/19/2012 0931   CALCIUM 9.1 09/27/2011 1017   ALKPHOS 79 06/19/2012 0931   ALKPHOS 65 09/27/2011 1017   AST 11 06/19/2012 0931   AST 14 09/27/2011 1017   ALT 7 06/19/2012 0931   ALT 10 09/27/2011 1017   BILITOT 0.62 06/19/2012 0931   BILITOT 0.7 09/27/2011 1017       RADIOGRAPHIC STUDIES: Ct Chest W Contrast  06/20/2012  *RADIOLOGY REPORT*  Clinical Data:  Lung cancer, radiation therapy complete.  CT CHEST, ABDOMEN AND PELVIS WITH CONTRAST  Technique:  Multidetector CT imaging of the chest, abdomen and pelvis was performed following the standard protocol during bolus administration of intravenous contrast.  Contrast: OMNIPAQUE IOHEXOL 300 MG/ML  SOLN  Comparison:  PET 01/27/2012 and CT chest 01/03/2012.   CT CHEST  Findings:  No pathologically enlarged mediastinal, hilar or axillary lymph nodes.  Atherosclerotic calcification of the arterial vasculature, including coronary arteries.  Heart size normal.  No pericardial effusion.  Mild centrilobular emphysema.  Scattered peripheral pulmonary nodules measure 3 mm or less in size, unchanged. There is a  there is a residual 4 mm subpleural nodular density in the peripheral left lower lobe (image 39), without surrounding ground-glass, as seen on 01/03/2012.  No pleural fluid.  Airway is unremarkable.  IMPRESSION: Residual 4 mm subpleural nodular density in the peripheral left lower lobe, previously seen as a mixed solid and ground-glass lesion on 01/03/2012, indicating a positive response to therapy.   CT ABDOMEN AND PELVIS  Findings:  Liver may be slightly decreased in attenuation diffusely.  A faint blush of hyper attenuation along the gallbladder fossa (image 62) may represent a focal area of sparing. Liver, gallbladder and adrenal glands are otherwise unremarkable. Low attenuation lesions in the kidneys measure up to 7 mm  on the left and are likely cysts.  Spleen, pancreas, stomach and bowel are unremarkable.  Hysterectomy.  Ovaries are visualized.  No pathologically enlarged lymph nodes.  Atherosclerotic calcification of the arterial vasculature without abdominal aortic aneurysm.  No free fluid.  No worrisome lytic or sclerotic lesions.  IMPRESSION:  1.  No evidence of metastatic disease in the abdomen or pelvis. 2.  Question hepatic steatosis.   Original Report Authenticated By: Leanna Battles, M.D.    ASSESSMENT AND PLAN:  1) Pulmonary lymphoid neoplasm questionable for non-Hodgkin lymphoma involving the left lower lobe of the lung: Status post definitive radiotherapy under the care of Dr. Roselind Messier. The patient has no evidence for disease progression on his recent scan and she had much improvement in the left lower lobe lung mass after radiotherapy. I discussed the scan results with the patient and her daughter. I recommended for her to continue on observation with repeat CT scan of the chest, abdomen and pelvis in 6 months. 2) COPD: Continue current treatment with albuterol inhaler. 3) hypertension: Currently on Cozaar and Toprol-XL.  She was advised to call immediately if she has any concerning  symptoms in the interval.  All questions were answered. The patient knows to call the clinic with any problems, questions or concerns. We can certainly see the patient much sooner if necessary.

## 2012-06-21 NOTE — Patient Instructions (Addendum)
No evidence for disease progression on his recent scan.  Followup visit in 6 months with repeat CT scan of the chest, abdomen and pelvis. 

## 2012-06-27 ENCOUNTER — Ambulatory Visit: Payer: Medicare Other | Admitting: Cardiothoracic Surgery

## 2012-07-02 ENCOUNTER — Telehealth: Payer: Self-pay | Admitting: Family Medicine

## 2012-07-02 MED ORDER — ALPRAZOLAM 0.5 MG PO TABS: 0.5000 mg | ORAL_TABLET | Freq: Three times a day (TID) | ORAL | Status: DC | PRN

## 2012-07-02 MED ORDER — CITALOPRAM HYDROBROMIDE 40 MG PO TABS: 40.0000 mg | ORAL_TABLET | Freq: Every evening | ORAL | Status: DC

## 2012-07-02 NOTE — Telephone Encounter (Signed)
Unfortunately her husband passed away this weekend.  Please call in rx refill for alprazolam as entered below.

## 2012-07-02 NOTE — Telephone Encounter (Signed)
Alprazolam called to [facility] [place].

## 2012-07-20 ENCOUNTER — Encounter: Payer: Self-pay | Admitting: Radiology

## 2012-07-23 ENCOUNTER — Encounter: Payer: Self-pay | Admitting: Family Medicine

## 2012-07-23 ENCOUNTER — Ambulatory Visit (INDEPENDENT_AMBULATORY_CARE_PROVIDER_SITE_OTHER)
Admission: RE | Admit: 2012-07-23 | Discharge: 2012-07-23 | Disposition: A | Discharge status: Home / Self Care | Payer: Medicare Other | Source: Ambulatory Visit | Attending: Family Medicine | Admitting: Family Medicine

## 2012-07-23 ENCOUNTER — Ambulatory Visit (INDEPENDENT_AMBULATORY_CARE_PROVIDER_SITE_OTHER): Payer: Medicare Other | Admitting: Family Medicine

## 2012-07-23 VITALS — BP 110/58 | HR 78 | Temp 97.3°F | Wt 112.0 lb

## 2012-07-23 DIAGNOSIS — R109 Unspecified abdominal pain: Secondary | ICD-10-CM

## 2012-07-23 DIAGNOSIS — R1032 Left lower quadrant pain: Secondary | ICD-10-CM

## 2012-07-23 DIAGNOSIS — R5381 Other malaise: Secondary | ICD-10-CM

## 2012-07-23 DIAGNOSIS — R102 Pelvic and perineal pain: Secondary | ICD-10-CM | POA: Insufficient documentation

## 2012-07-23 LAB — CBC WITH DIFFERENTIAL/PLATELET
Basophils Absolute: 0 10*3/uL (ref 0.0–0.1)
Basophils Relative: 0.4 % (ref 0.0–3.0)
Eosinophils Absolute: 0.2 10*3/uL (ref 0.0–0.7)
Hemoglobin: 14.5 g/dL (ref 12.0–15.0)
Lymphocytes Relative: 19.7 % (ref 12.0–46.0)
Monocytes Relative: 10.1 % (ref 3.0–12.0)
Neutro Abs: 6 10*3/uL (ref 1.4–7.7)
Neutrophils Relative %: 67.8 % (ref 43.0–77.0)
RBC: 4.69 Mil/uL (ref 3.87–5.11)

## 2012-07-23 LAB — POCT URINALYSIS DIPSTICK
Bilirubin, UA: NEGATIVE
Glucose, UA: NEGATIVE
Ketones, UA: NEGATIVE
Leukocytes, UA: NEGATIVE
Nitrite, UA: NEGATIVE

## 2012-07-23 LAB — VITAMIN B12: Vitamin B-12: 125 pg/mL — ABNORMAL LOW (ref 211–911)

## 2012-07-23 MED ORDER — CYANOCOBALAMIN 100 MCG PO TABS: 100.0000 ug | ORAL_TABLET | Freq: Every day | ORAL | Status: DC

## 2012-07-23 NOTE — Progress Notes (Signed)
.vs Subjective:    Patient ID: Marie Holmes, female    DOB: Jan 24, 1936, 77 y.o.   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 Roselind Messier and Shirline Frees, here for left abdominal/back pain x 1 month.   She does have a h/o nephrolithiasis.  States this feels similar. Increased urinary frequency and suprapubic pressure.  No dysuria.  Some back pain, bilaterally. No n/v/d. No fever.  No gross hematuria.  Last CT of abdomen in 02/2012. Reviewed.  Has lost a few pounds again but recently lost her husband.  She feels she is coping best she can. No SI. Has cut back on her xanax. Wt Readings from Last 3 Encounters:  07/23/12 112 lb (50.803 kg)  06/21/12 117 lb 9.6 oz (53.343 kg)  06/14/12 119 lb 8 oz (54.205 kg)    Patient Active Problem List   Diagnosis Date Noted  . Suprapubic pain 07/23/2012  . Ethmoid sinusitis 05/09/2012  . Lymphoid neoplasm 02/28/2012  . Weight loss 09/27/2011  . COLONIC POLYPS 09/24/2008  . DIABETES MELLITUS, TYPE II 08/31/2007  . HYPERLIPIDEMIA 05/11/2006  . ANXIETY 05/11/2006  . HYPERTENSION 05/11/2006  . VENOUS INSUFFICIENCY, CHRONIC 05/11/2006  . COPD 05/11/2006  . ANEMIA, IRON DEFICIENCY, UNSPEC. 04/20/2006  . GASTROESOPHAGEAL REFLUX, NO ESOPHAGITIS 04/20/2006  . IRRITABLE BOWEL SYNDROME 04/20/2006  . OSTEOARTHRITIS, MULTI SITES 04/20/2006  . Osteoporosis, unspecified 04/20/2006  . INSOMNIA NOS 04/20/2006  . INCONTINENCE, URGE 04/20/2006   Past Medical History  Diagnosis Date  . Benign tumor of breast 2004    left  . Colon polyp   . Broken ribs     history of several broken ribs on right and compression  fx  . Fibroma of skin     Multiple fibromas/lipomas on arms and legs B  . Pyelonephritis   . Anxiety   . COPD (chronic obstructive pulmonary disease)   .  Hyperlipidemia   . Osteoarthritis   . History of CT scan of abdomen 05/23/2003    Mild fatty liver inf, tiny hypodensity in right lobe of liver  . Kidney stone 02/21/1981    Right/removed  . Pulmonary nodules   . Diabetes mellitus without complication   . Cancer     non-hodgkins b cell lymphoma  . 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 Appendectomy  02/21/1961  . Exploratory laparotomy  02/21/1970    for ? adhesions  . Abdominal hysterectomy  02/21/1970    right ovary remaining  . Bone marrow biopsy  03/09/2012  . Lung core biopsy  02/06/2012   History  Substance Use Topics  . Smoking status: Current Every Day Smoker -- 0.50 packs/day for 30 years    Types: Cigarettes  . Smokeless tobacco: Never Used     Comment: 3/4 pack X 50 years  . Alcohol Use: No   Family History  Problem Relation Age of Onset  . Parkinsonism Brother   . Stroke Father   . Cancer Sister    Allergies  Allergen Reactions  . Chantix (Varenicline)     Made her angry, nausea, made blood pressure go up  . Sulfonamide Derivatives Swelling and Rash    Around the mouth swelling   Current Outpatient Prescriptions on File Prior to  Visit  Medication Sig Dispense Refill  . albuterol (PROVENTIL HFA;VENTOLIN HFA) 108 (90 BASE) MCG/ACT inhaler Inhale 2 puffs into the lungs every 6 (six) hours as needed for wheezing.  1 Inhaler  3  . ALPRAZolam (XANAX) 0.5 MG tablet Take 1 tablet (0.5 mg total) by mouth 3 (three) times daily as needed for sleep or anxiety. For insomnia  90 tablet  0  . aspirin EC 81 MG tablet Take 81 mg by mouth every morning.      . citalopram (CELEXA) 40 MG tablet Take 1 tablet (40 mg total) by mouth every evening.  30 tablet  6  . ibuprofen (ADVIL,MOTRIN) 200 MG tablet Take 200 mg by mouth every 8 (eight) hours as needed. For pain      . losartan (COZAAR) 50 MG tablet Take 1 tablet (50 mg total)  by mouth every morning.  30 tablet  6  . metFORMIN (GLUCOPHAGE) 1000 MG tablet Take 1 tablet (1,000 mg total) by mouth 2 (two) times daily with a meal.  180 tablet  1  . metoprolol succinate (TOPROL-XL) 100 MG 24 hr tablet Take 1 tablet (100 mg total) by mouth every morning.  30 tablet  6  . Multiple Vitamin (MULTIVITAMIN) tablet Take 1 tablet by mouth daily at 12 noon.        No current facility-administered medications on file prior to visit.   The PMH, PSH, Social History, Family History, Medications, and allergies have been reviewed in [facility], and have been updated if relevant.  Review of Systems See HPI    Objective:   Physical Exam BP 110/58  Pulse 78  Temp(Src) 97.3 F (36.3 C)  Wt 112 lb (50.803 kg)  BMI 21.17 kg/m2  General:  Well-developed,well-nourished,in no acute distress; alert,appropriate and cooperative throughout examination Head:  normocephalic and atraumatic.   Abd:  Soft, NT, pos BS, no CVA tenderness Skin:  Intact without suspicious lesions or rashes Psych:  Cognition and judgment appear intact. Alert and cooperative with normal attention span and concentration. No apparent delusions, illusions, hallucinations     Assessment & Plan:  1. Suprapubic pain, left UA pos for RBCs only. I do suspect nephrolithiasis.  Send urine for cx.  Check KUB today since she has had multiple recent CT scans (scheduled for follow up CT in 10/2012).  If unable to visualize on KUB, will proceed with CT. The patient indicates understanding of these issues and agrees with the plan.  - POCT urinalysis dipstick - DG Abd 1 View; Future - Urine culture

## 2012-07-23 NOTE — Addendum Note (Signed)
Addended by: Dianne Dun on: 07/23/2012 03:47 PM   Modules accepted: Orders

## 2012-07-23 NOTE — Patient Instructions (Addendum)
Good to see you. We will call you with your lab results and your xray results.

## 2012-07-25 ENCOUNTER — Other Ambulatory Visit: Payer: Self-pay | Admitting: Family Medicine

## 2012-07-25 DIAGNOSIS — D492 Neoplasm of unspecified behavior of bone, soft tissue, and skin: Secondary | ICD-10-CM

## 2012-07-25 DIAGNOSIS — R1030 Lower abdominal pain, unspecified: Secondary | ICD-10-CM

## 2012-07-25 LAB — URINE CULTURE: Organism ID, Bacteria: NO GROWTH

## 2012-07-27 ENCOUNTER — Other Ambulatory Visit: Payer: Medicare Other

## 2012-07-30 ENCOUNTER — Ambulatory Visit (INDEPENDENT_AMBULATORY_CARE_PROVIDER_SITE_OTHER)
Admission: RE | Admit: 2012-07-30 | Discharge: 2012-07-30 | Disposition: A | Discharge status: Home / Self Care | Payer: Medicare Other | Source: Ambulatory Visit | Attending: Family Medicine | Admitting: Family Medicine

## 2012-07-30 DIAGNOSIS — R1030 Lower abdominal pain, unspecified: Secondary | ICD-10-CM

## 2012-07-30 DIAGNOSIS — D47Z9 Other specified neoplasms of uncertain behavior of lymphoid, hematopoietic and related tissue: Secondary | ICD-10-CM

## 2012-07-30 DIAGNOSIS — D492 Neoplasm of unspecified behavior of bone, soft tissue, and skin: Secondary | ICD-10-CM

## 2012-07-30 DIAGNOSIS — R109 Unspecified abdominal pain: Secondary | ICD-10-CM

## 2012-07-31 ENCOUNTER — Other Ambulatory Visit: Payer: Self-pay | Admitting: Family Medicine

## 2012-07-31 DIAGNOSIS — N200 Calculus of kidney: Secondary | ICD-10-CM

## 2012-07-31 MED ORDER — TAMSULOSIN HCL 0.4 MG PO CAPS: ORAL_CAPSULE | ORAL | Status: DC

## 2012-08-01 ENCOUNTER — Other Ambulatory Visit: Payer: Medicare Other

## 2012-08-02 ENCOUNTER — Telehealth: Payer: Self-pay

## 2012-08-02 MED ORDER — HYDROCODONE-ACETAMINOPHEN 5-325 MG PO TABS: 1.0000 | ORAL_TABLET | ORAL | Status: DC | PRN

## 2012-08-02 NOTE — Telephone Encounter (Signed)
Pt left v/m;pt has kidney stone in lt kidney; Flomax was sent to [facility]; pharmacy would not fill Flomax because pt is allergic to sulfa. Pt request substitute med to [facility].. Pt request cb.

## 2012-08-02 NOTE — Telephone Encounter (Signed)
There is not really a good sub. Drink as much fluids as possible and pain meds prn.

## 2012-08-02 NOTE — Telephone Encounter (Signed)
Spoke to patient and she said she was not given any pain meds and wants to know if you can call her some to [facility] [place]

## 2012-08-02 NOTE — Telephone Encounter (Signed)
Hydrocodone-apap 5-325, 1 po q 4 hours prn pain, #60, 0 refills

## 2012-08-02 NOTE — Telephone Encounter (Signed)
Medication called to pharmacy and patient advised 

## 2012-08-06 ENCOUNTER — Encounter: Payer: Self-pay | Admitting: Family Medicine

## 2012-09-04 ENCOUNTER — Ambulatory Visit (INDEPENDENT_AMBULATORY_CARE_PROVIDER_SITE_OTHER): Payer: Medicare Other | Admitting: Urology

## 2012-09-04 DIAGNOSIS — R1032 Left lower quadrant pain: Secondary | ICD-10-CM

## 2012-09-04 DIAGNOSIS — N200 Calculus of kidney: Secondary | ICD-10-CM

## 2012-09-04 DIAGNOSIS — R1031 Right lower quadrant pain: Secondary | ICD-10-CM

## 2012-09-05 ENCOUNTER — Other Ambulatory Visit (INDEPENDENT_AMBULATORY_CARE_PROVIDER_SITE_OTHER): Payer: Medicare Other

## 2012-09-05 DIAGNOSIS — E538 Deficiency of other specified B group vitamins: Secondary | ICD-10-CM

## 2012-09-05 LAB — VITAMIN B12: Vitamin B-12: 420 pg/mL (ref 211–911)

## 2012-09-06 ENCOUNTER — Other Ambulatory Visit: Payer: Self-pay | Admitting: *Deleted

## 2012-09-06 MED ORDER — ALPRAZOLAM 0.5 MG PO TABS: 0.5000 mg | ORAL_TABLET | Freq: Three times a day (TID) | ORAL | Status: DC | PRN

## 2012-09-06 NOTE — Telephone Encounter (Signed)
Refill request for alprazolam.  Patient states she feels much better, mentally and physically.

## 2012-09-07 NOTE — Telephone Encounter (Signed)
Refill called to [facility]. 

## 2012-09-11 ENCOUNTER — Encounter: Payer: Self-pay | Admitting: Radiation Oncology

## 2012-09-11 DIAGNOSIS — Z923 Personal history of irradiation: Secondary | ICD-10-CM | POA: Insufficient documentation

## 2012-09-13 ENCOUNTER — Ambulatory Visit
Admission: RE | Admit: 2012-09-13 | Discharge: 2012-09-13 | Disposition: A | Discharge status: Home / Self Care | Payer: Medicare Other | Source: Ambulatory Visit | Attending: Radiation Oncology | Admitting: Radiation Oncology

## 2012-09-13 ENCOUNTER — Encounter: Payer: Self-pay | Admitting: Radiation Oncology

## 2012-09-13 VITALS — BP 126/68 | HR 71 | Temp 97.7°F | Resp 20 | Wt 118.4 lb

## 2012-09-13 DIAGNOSIS — D492 Neoplasm of unspecified behavior of bone, soft tissue, and skin: Secondary | ICD-10-CM

## 2012-09-13 NOTE — Progress Notes (Addendum)
Follow up [facility] rad txs:04/01/12-05/08/12,vitals wnl, 95% room air sats, occasional cough white pheglm, , has left ear pain,  Just a couple days, has sinus drainage and head ache,  Scratchy throat also,appetite good now, loss of husband this past May,2014, has a kidney stone, in left kidney, no infection from Urologist a couple weeks past sob with mod exertion,still smoking about 8 cigarettes daily trying to quit 10:50 AM

## 2012-09-13 NOTE — Progress Notes (Signed)
Radiation Oncology         [phone]) [phone] ________________________________  Name: Marie Holmes MRN: [id number]  Date: 09/13/2012  DOB: 03-Oct-1935  Follow-Up Visit Note  CC: Ruthe Mannan, MD  Dianne Dun, MD  Diagnosis:   Non-Hodgkin's lymphoma presenting in the left chest  Interval Since Last Radiation:  4  months  Narrative:  The patient returns today for routine follow-up.  Clinically she is doing well without any pain along the left chest.. Her breathing is stable. She denies any hemoptysis. She has had some discomfort along her left ear area and I recommended she followup with her primary care physician concerning this issue. The patient did undergo CAT scans in late April showing essentially complete response with no new problems.                              ALLERGIES:  is allergic to chantix and sulfonamide derivatives.  Meds: Current Outpatient Prescriptions  Medication Sig Dispense Refill  . ALPRAZolam (XANAX) 0.5 MG tablet Take 1 tablet (0.5 mg total) by mouth 3 (three) times daily as needed for sleep or anxiety.  90 tablet  0  . aspirin EC 81 MG tablet Take 81 mg by mouth every morning.      . citalopram (CELEXA) 40 MG tablet Take 1 tablet (40 mg total) by mouth every evening.  30 tablet  6  . cyanocobalamin 100 MCG tablet Take 1 tablet (100 mcg total) by mouth daily.  30 tablet  3  . ibuprofen (ADVIL,MOTRIN) 200 MG tablet Take 200 mg by mouth every 8 (eight) hours as needed. For pain      . losartan (COZAAR) 50 MG tablet Take 1 tablet (50 mg total) by mouth every morning.  30 tablet  6  . metFORMIN (GLUCOPHAGE) 1000 MG tablet Take 1 tablet (1,000 mg total) by mouth 2 (two) times daily with a meal.  180 tablet  1  . metoprolol succinate (TOPROL-XL) 100 MG 24 hr tablet Take 1 tablet (100 mg total) by mouth every morning.  30 tablet  6  . Multiple Vitamin (MULTIVITAMIN) tablet Take 1 tablet by mouth daily at 12 noon.       Marland Kitchen albuterol (PROVENTIL HFA;VENTOLIN HFA) 108 (90 BASE)  MCG/ACT inhaler Inhale 2 puffs into the lungs every 6 (six) hours as needed for wheezing.  1 Inhaler  3  . HYDROcodone-acetaminophen (NORCO/VICODIN) 5-325 MG per tablet Take 1 tablet by mouth every 4 (four) hours as needed for pain.  60 tablet  0  . tamsulosin (FLOMAX) 0.4 MG CAPS 1 capsule daily until stone is passed.  30 capsule  3   No current facility-administered medications for this encounter.    Physical Findings: The patient is in no acute distress. Patient is alert and oriented.  weight is 118 lb 6.4 oz (53.706 kg). Her oral temperature is 97.7 F (36.5 C). Her blood pressure is 126/68 and her pulse is 71. Her respiration is 20 and oxygen saturation is 95%. .  No palpable cervical supraclavicular or axillary adenopathy. The lungs are clear to auscultation. The heart has a regular rhythm and rate. The abdomen is soft and nontender with normal bowel sounds.  Lab Findings: Lab Results  Component Value Date   WBC 8.9 07/23/2012   HGB 14.5 07/23/2012   HCT 42.6 07/23/2012   MCV 90.9 07/23/2012   PLT 291.0 07/23/2012     Radiographic Findings: No results  found.  Impression:  No evidence of recurrence on clinical exam today. CT scans showing good response to therapy  Plan:  When necessary followup in radiation oncology. Patient will continue close followup in medical oncology with scans scheduled for late October.  _____________________________________  -----------------------------------  Billie Lade, PhD, MD

## 2012-10-08 ENCOUNTER — Encounter: Payer: Self-pay | Admitting: Family Medicine

## 2012-10-08 ENCOUNTER — Ambulatory Visit (INDEPENDENT_AMBULATORY_CARE_PROVIDER_SITE_OTHER): Payer: Medicare Other | Admitting: Family Medicine

## 2012-10-08 VITALS — BP 140/70 | HR 77 | Temp 98.2°F | Ht 61.0 in | Wt 121.0 lb

## 2012-10-08 DIAGNOSIS — L989 Disorder of the skin and subcutaneous tissue, unspecified: Secondary | ICD-10-CM | POA: Insufficient documentation

## 2012-10-08 DIAGNOSIS — L739 Follicular disorder, unspecified: Secondary | ICD-10-CM | POA: Insufficient documentation

## 2012-10-08 MED ORDER — CEPHALEXIN 500 MG PO CAPS: 500.0000 mg | ORAL_CAPSULE | Freq: Two times a day (BID) | ORAL | Status: DC

## 2012-10-08 MED ORDER — MOMETASONE FUROATE 0.1 % EX CREA: TOPICAL_CREAM | Freq: Every day | CUTANEOUS | Status: DC

## 2012-10-08 MED ORDER — ALBUTEROL SULFATE HFA 108 (90 BASE) MCG/ACT IN AERS: 2.0000 | INHALATION_SPRAY | RESPIRATORY_TRACT | Status: DC | PRN

## 2012-10-08 NOTE — Assessment & Plan Note (Signed)
On legs - after walking in the ocean- will tx with keflex and update if no improvement Elocon cream for itch prn  Adv to avoid sun

## 2012-10-08 NOTE — Patient Instructions (Addendum)
Take the keflex as directed (antibiotic) Use elocon cream once daily as needed to itchy areas Stay cool  Update if not starting to improve in a week or if worsening   We will do dermatology referral for spot on arm at check out

## 2012-10-08 NOTE — Assessment & Plan Note (Signed)
May be a granulomatous rxn but cannot r/o a squamous cell skin ca Ref to derm for eval

## 2012-10-08 NOTE — Progress Notes (Signed)
Subjective:    Patient ID: Marie Holmes, female    DOB: 02/11/1936, 77 y.o.   MRN: [id number]  HPI Here with sunburn/rash   She went to the beach this weekend  Both legs she has a rash - with red bumps  Can feel on arms "under the skin" - but cannot see it there   She had radiation for cancer this past year (to the lung)   She was good at wearing sunscreen- not a new brand  She did walk in the ocean a bit  This itches and hurts a bit  A few bumps had clear discharge   She has a bump on R arm-unrelated / is hard and a bit sore to the touch  ? Skin cancer or other     Patient Active Problem List   Diagnosis Date Noted  . Hx of radiation therapy   . Suprapubic pain 07/23/2012  . Ethmoid sinusitis 05/09/2012  . Lymphoid neoplasm 02/28/2012  . Weight loss 09/27/2011  . COLONIC POLYPS 09/24/2008  . DIABETES MELLITUS, TYPE II 08/31/2007  . HYPERLIPIDEMIA 05/11/2006  . ANXIETY 05/11/2006  . HYPERTENSION 05/11/2006  . VENOUS INSUFFICIENCY, CHRONIC 05/11/2006  . COPD 05/11/2006  . ANEMIA, IRON DEFICIENCY, UNSPEC. 04/20/2006  . GASTROESOPHAGEAL REFLUX, NO ESOPHAGITIS 04/20/2006  . IRRITABLE BOWEL SYNDROME 04/20/2006  . OSTEOARTHRITIS, MULTI SITES 04/20/2006  . Osteoporosis, unspecified 04/20/2006  . INSOMNIA NOS 04/20/2006  . INCONTINENCE, URGE 04/20/2006   Past Medical History  Diagnosis Date  . Benign tumor of breast 2004    left  . Colon polyp   . Broken ribs     history of several broken ribs on right and compression  fx  . Fibroma of skin     Multiple fibromas/lipomas on arms and legs B  . Pyelonephritis   . Anxiety   . COPD (chronic obstructive pulmonary disease)   . Hyperlipidemia   . Osteoarthritis   . History of CT scan of abdomen 05/23/2003    Mild fatty liver inf, tiny hypodensity in right lobe of liver  . Kidney stone 02/21/1981    Right/removed  . Pulmonary nodules   . Diabetes mellitus without complication   . Cancer     non-hodgkins b cell  lymphoma  . 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 Hx of radiation therapy 04/11/12- 05/08/12    left chest wall area, 40 gray 20 fx   Past Surgical History  Procedure Laterality Date  . Appendectomy  02/21/1961  . Exploratory laparotomy  02/21/1970    for ? adhesions  . Abdominal hysterectomy  02/21/1970    right ovary remaining  . Bone marrow biopsy  03/09/2012  . Lung core biopsy  02/06/2012   History  Substance Use Topics  . Smoking status: Current Every Day Smoker -- 0.50 packs/day for 30 years    Types: Cigarettes  . Smokeless tobacco: Never Used     Comment: 3/4 pack X 50 years  . Alcohol Use: No   Family History  Problem Relation Age of Onset  . Parkinsonism Brother   . Stroke Father   . Cancer Sister    Allergies  Allergen Reactions  . Chantix [Varenicline]     Made her angry, nausea, made blood pressure go up  . Sulfonamide Derivatives Swelling and Rash    Around the mouth swelling   Current Outpatient Prescriptions on File Prior to Visit  Medication Sig Dispense Refill  . ALPRAZolam (  XANAX) 0.5 MG tablet Take 1 tablet (0.5 mg total) by mouth 3 (three) times daily as needed for sleep or anxiety.  90 tablet  0  . aspirin EC 81 MG tablet Take 81 mg by mouth every morning.      . citalopram (CELEXA) 40 MG tablet Take 1 tablet (40 mg total) by mouth every evening.  30 tablet  6  . cyanocobalamin 100 MCG tablet Take 1 tablet (100 mcg total) by mouth daily.  30 tablet  3  . HYDROcodone-acetaminophen (NORCO/VICODIN) 5-325 MG per tablet Take 1 tablet by mouth every 4 (four) hours as needed for pain.  60 tablet  0  . ibuprofen (ADVIL,MOTRIN) 200 MG tablet Take 200 mg by mouth every 8 (eight) hours as needed. For pain      . losartan (COZAAR) 50 MG tablet Take 1 tablet (50 mg total) by mouth every morning.  30 tablet  6  . metFORMIN (GLUCOPHAGE) 1000 MG tablet Take 1 tablet (1,000 mg total) by mouth 2 (two) times daily with a  meal.  180 tablet  1  . metoprolol succinate (TOPROL-XL) 100 MG 24 hr tablet Take 1 tablet (100 mg total) by mouth every morning.  30 tablet  6  . Multiple Vitamin (MULTIVITAMIN) tablet Take 1 tablet by mouth daily at 12 noon.       . tamsulosin (FLOMAX) 0.4 MG CAPS 1 capsule daily until stone is passed.  30 capsule  3   No current facility-administered medications on file prior to visit.       Review of Systems Review of Systems  Constitutional: Negative for fever, appetite change, fatigue and unexpected weight change.  Eyes: Negative for pain and visual disturbance.  Respiratory: Negative for cough and shortness of breath.   Cardiovascular: Negative for cp or palpitations    Gastrointestinal: Negative for nausea, diarrhea and constipation.  Genitourinary: Negative for urgency and frequency.  Skin: Negative for pallor or and pos for rash as well as lesion on arm Neurological: Negative for weakness, light-headedness, numbness and headaches.  Hematological: Negative for adenopathy. Does not bruise/bleed easily.  Psychiatric/Behavioral: Negative for dysphoric mood. The patient is not nervous/anxious.         Objective:   Physical Exam  Constitutional: She appears well-developed and well-nourished. No distress.  HENT:  Head: Normocephalic and atraumatic.  Eyes: Conjunctivae and EOM are normal. Pupils are equal, round, and reactive to light. No scleral icterus.  Neck: Normal range of motion. Neck supple.  Cardiovascular: Normal rate and regular rhythm.   Lymphadenopathy:    She has no cervical adenopathy.  Neurological: She is alert.  Skin: Skin is warm and dry. Rash noted. No pallor.  Right arm- 1 cm round lesion-firm with some scale   bilat lower legs- erythematous papules that do not blanche No sunburn apparent  No drainage or pustules  Psychiatric: She has a normal mood and affect.          Assessment & Plan:

## 2012-10-16 ENCOUNTER — Other Ambulatory Visit: Payer: Self-pay | Admitting: Dermatology

## 2012-10-17 ENCOUNTER — Other Ambulatory Visit: Payer: Self-pay | Admitting: *Deleted

## 2012-10-17 MED ORDER — ALPRAZOLAM 0.5 MG PO TABS: 0.5000 mg | ORAL_TABLET | Freq: Three times a day (TID) | ORAL | Status: DC | PRN

## 2012-10-17 NOTE — Telephone Encounter (Signed)
Last filled 09/07/12

## 2012-10-17 NOTE — Telephone Encounter (Signed)
Refill called to [facility]. 

## 2012-10-18 ENCOUNTER — Encounter: Payer: Self-pay | Admitting: Family Medicine

## 2012-10-18 ENCOUNTER — Ambulatory Visit (INDEPENDENT_AMBULATORY_CARE_PROVIDER_SITE_OTHER)
Admission: RE | Admit: 2012-10-18 | Discharge: 2012-10-18 | Disposition: A | Discharge status: Home / Self Care | Payer: Medicare Other | Source: Ambulatory Visit | Attending: Family Medicine | Admitting: Family Medicine

## 2012-10-18 ENCOUNTER — Ambulatory Visit (INDEPENDENT_AMBULATORY_CARE_PROVIDER_SITE_OTHER): Payer: Medicare Other | Admitting: Family Medicine

## 2012-10-18 VITALS — BP 120/60 | HR 68 | Temp 98.0°F | Ht 61.0 in | Wt 120.0 lb

## 2012-10-18 DIAGNOSIS — J449 Chronic obstructive pulmonary disease, unspecified: Secondary | ICD-10-CM

## 2012-10-18 MED ORDER — FLUTICASONE-SALMETEROL 250-50 MCG/DOSE IN AEPB: 1.0000 | INHALATION_SPRAY | Freq: Two times a day (BID) | RESPIRATORY_TRACT | Status: DC

## 2012-10-18 MED ORDER — ALBUTEROL SULFATE HFA 108 (90 BASE) MCG/ACT IN AERS: 2.0000 | INHALATION_SPRAY | RESPIRATORY_TRACT | Status: DC | PRN

## 2012-10-18 NOTE — Progress Notes (Signed)
Echo 10/03/12 (done at [facility] in [place])  LVEF 60% NO RWA.   Trivial MR and TR.   Holter (48 hour) 10/03/12: Avg HR 71 BPM- NSR. 2 PVCs and 21 PACs. NO sustained arrhythmias

## 2012-10-18 NOTE — Progress Notes (Signed)
I do not see a note from me in connect care

## 2012-10-18 NOTE — Patient Instructions (Addendum)
Good to see you. We are adding Advair- 1 puff twice daily. Continue albuterol as needed.  I will call you with your chest xray results this afternoon.

## 2012-10-18 NOTE — Progress Notes (Signed)
Subjective:    Patient ID: Marie Holmes, female    DOB: 1935-04-26, 77 y.o.   MRN: [id number]  HPI 77 yo very pleasant female with h/o COPD, DM, HTN and recent diagnosis of early lymphoid CA here for chest congestion and oxygen desaturation.    She does continue to smoke.  Past 4 months, worsening cough, wheezing and SOB.  Not using her albuterol because last rx sent was proair and it makes her more jittery.  No fevers or chills.  Cough is productive but she "cannot get the mucous out."  No CP.  Checking her O2 sats at home and falling into low 90s. SOB worse at bedtime.   Just finished course of Keflex for skin infection.  Wt Readings from Last 3 Encounters:  10/18/12 120 lb (54.432 kg)  10/08/12 121 lb (54.885 kg)  09/13/12 118 lb 6.4 oz (53.706 kg)     Patient Active Problem List   Diagnosis Date Noted  . Folliculitis 10/08/2012  . Skin lesion of right arm 10/08/2012  . Hx of radiation therapy   . Suprapubic pain 07/23/2012  . Ethmoid sinusitis 05/09/2012  . Lymphoid neoplasm 02/28/2012  . Weight loss 09/27/2011  . COLONIC POLYPS 09/24/2008  . DIABETES MELLITUS, TYPE II 08/31/2007  . HYPERLIPIDEMIA 05/11/2006  . ANXIETY 05/11/2006  . HYPERTENSION 05/11/2006  . VENOUS INSUFFICIENCY, CHRONIC 05/11/2006  . COPD 05/11/2006  . ANEMIA, IRON DEFICIENCY, UNSPEC. 04/20/2006  . GASTROESOPHAGEAL REFLUX, NO ESOPHAGITIS 04/20/2006  . IRRITABLE BOWEL SYNDROME 04/20/2006  . OSTEOARTHRITIS, MULTI SITES 04/20/2006  . Osteoporosis, unspecified 04/20/2006  . INSOMNIA NOS 04/20/2006  . INCONTINENCE, URGE 04/20/2006   Past Medical History  Diagnosis Date  . Benign tumor of breast 2004    left  . Colon polyp   . Broken ribs     history of several broken ribs on right and compression  fx  . Fibroma of skin     Multiple fibromas/lipomas on arms and legs B  . Pyelonephritis   . Anxiety   . COPD (chronic obstructive pulmonary disease)   . Hyperlipidemia   . Osteoarthritis    . History of CT scan of abdomen 05/23/2003    Mild fatty liver inf, tiny hypodensity in right lobe of liver  . Kidney stone 02/21/1981    Right/removed  . Pulmonary nodules   . Diabetes mellitus without complication   . Cancer     non-hodgkins b cell lymphoma  . 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 Hx of radiation therapy 04/11/12- 05/08/12    left chest wall area, 40 gray 20 fx   Past Surgical History  Procedure Laterality Date  . Appendectomy  02/21/1961  . Exploratory laparotomy  02/21/1970    for ? adhesions  . Abdominal hysterectomy  02/21/1970    right ovary remaining  . Bone marrow biopsy  03/09/2012  . Lung core biopsy  02/06/2012   History  Substance Use Topics  . Smoking status: Current Every Day Smoker -- 0.50 packs/day for 30 years    Types: Cigarettes  . Smokeless tobacco: Never Used     Comment: 3/4 pack X 50 years  . Alcohol Use: No   Family History  Problem Relation Age of Onset  . Parkinsonism Brother   . Stroke Father   . Cancer Sister    Allergies  Allergen Reactions  . Chantix [Varenicline]     Made her angry, nausea, made blood pressure go up  .  Sulfonamide Derivatives Swelling and Rash    Around the mouth swelling   Current Outpatient Prescriptions on File Prior to Visit  Medication Sig Dispense Refill  . albuterol (PROVENTIL HFA;VENTOLIN HFA) 108 (90 BASE) MCG/ACT inhaler Inhale 2 puffs into the lungs every 4 (four) hours as needed for wheezing.  1 Inhaler  1  . ALPRAZolam (XANAX) 0.5 MG tablet Take 1 tablet (0.5 mg total) by mouth 3 (three) times daily as needed for sleep or anxiety.  90 tablet  0  . aspirin EC 81 MG tablet Take 81 mg by mouth every morning.      . citalopram (CELEXA) 40 MG tablet Take 1 tablet (40 mg total) by mouth every evening.  30 tablet  6  . cyanocobalamin 100 MCG tablet Take 1 tablet (100 mcg total) by mouth daily.  30 tablet  3  . ibuprofen (ADVIL,MOTRIN) 200 MG tablet  Take 200 mg by mouth every 8 (eight) hours as needed. For pain      . losartan (COZAAR) 50 MG tablet Take 1 tablet (50 mg total) by mouth every morning.  30 tablet  6  . metFORMIN (GLUCOPHAGE) 1000 MG tablet Take 1 tablet (1,000 mg total) by mouth 2 (two) times daily with a meal.  180 tablet  1  . metoprolol succinate (TOPROL-XL) 100 MG 24 hr tablet Take 1 tablet (100 mg total) by mouth every morning.  30 tablet  6  . mometasone (ELOCON) 0.1 % cream Apply topically daily. To affected areas  45 g  0  . Multiple Vitamin (MULTIVITAMIN) tablet Take 1 tablet by mouth daily at 12 noon.        No current facility-administered medications on file prior to visit.   The PMH, PSH, Social History, Family History, Medications, and allergies have been reviewed in [facility], and have been updated if relevant.   Review of Systems  See HPI     Objective:   Physical Exam  BP 120/60  Pulse 68  Temp(Src) 98 F (36.7 C)  Ht 5\' 1"  (1.549 m)  Wt 120 lb (54.432 kg)  BMI 22.69 kg/m2  SpO2 94%  Constitutional: Vital signs are normal. She appears well-developed and well-nourished. She is cooperative.  Non-toxic appearance. She does not appear ill. No distress.  Head: Normocephalic.  Cardiovascular: Normal rate, regular rhythm, S1 normal, S2 normal, normal heart sounds, intact distal pulses and normal pulses.  Exam reveals no gallop and no friction rub.   No murmur heard. Pulmonary/Chest: Effort normal and breath sounds normal. Not tachypneic.  Scattered exp wheezes, decreased BS bilaterally Abdominal: Soft. Normal appearance and bowel sounds are normal. There is no tenderness.  Neurological: She is alert.  Skin: Skin is warm, dry and intact. No rash noted.  Psychiatric: Her speech is normal and behavior is normal. Judgment and thought content normal. Her mood appears not anxious. Cognition and memory are normal. She does not exhibit a depressed mood.      Assessment & Plan:.    1. COPD Deteriorated.   Continues to smoke and not ready to quit. Will add Advair, rx for albuterol refilled for rescue. Will get CXR today. - DG Chest 2 View; Future

## 2012-10-24 NOTE — Progress Notes (Signed)
Cardiac Electrophysiology Office Consultation Note     Subjective:      Amy Galloway is a 77 yo patient who is seen for evaluation of PACs and PVCs kindly referred by Dr Salvadore Farber. She reports she was diagnosised with PSVT 8 years ago, after having a stress test. She has been taking propanol for this for 8 years now and it was controlling the PSVT.  The past year and half she has been under a lot of stress, she had back surgery, her husband suffered a major heart attack and she recently fell and fractured her left wrist. The episodes of fast heart rate and palpitations started occuring despite taking the propanolol.   They episodes do not have a particular trigger they happen when she is lying in bed and with activity.  They typically last 5-15 minutes, the most severe episode she has had lastd 30 minutes.  During the episodes of fast heart rate she profusely sweats, she feels weak, lightheadedness and dizziness.    No associated SOB or chest pain.  She usually just lays down until the episode passes and blast the air conditioner. She has not had the opportunity to try a vagal maneuver.   On average she is having the episode 2-3 times a month.   She has a past medical history of IBS, clotting disorder, hypertension, back surgery.   She had a cardiac cath at [facility] 8 years, reported mild blockages that were not bad enough to stents.     Echo 10/03/12 (done at [facility] in [place])  LVEF 60% NO RWA.   Trivial MR and TR.   Holter (48 hour) 10/03/12: Avg HR 71 BPM- NSR. 2 PVCs and 21 PACs. NO sustained arrhythmias.     Family hx: mother CVA 70's/colan cancer, father hx prostate cancer, Oldest brother MI 58 yo. Sister CHF in 30yo. Brother youngest- cardiomyopathy, viral etiology . Paternal and maternal grandfather Mi in 14's.  No hx ppm   Social hx: Quit 2 years ago, tobacco use x 50 years. Very occasional. Married. 3 children. Active but does not exercise, arthritis in  both hips . 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       Patient Active Problem List   Diagnosis Code   ??? PVC (premature ventricular contraction) 427.69   ??? PAC (premature atrial contraction) 427.61   ??? Palpitation 785.1     Patient Active Problem List    Diagnosis Date Noted   ??? PVC (premature ventricular contraction) 10/24/2012   ??? PAC (premature atrial contraction) 10/24/2012   ??? Palpitation 10/24/2012     Current Outpatient Prescriptions   Medication Sig Dispense Refill   ??? pantoprazole (PROTONIX) 20 mg tablet Take 20 mg by mouth daily.       ??? losartan (COZAAR) 25 mg tablet Take  by mouth daily.       ??? B INFANTIS/B ANI/B LON/B BIFID (PROBIOTIC 4X PO) Take  by mouth.       ??? aspirin (ASPIRIN) 325 mg tablet Take 325 mg by mouth daily.       ??? levothyroxine (LEVOXYL) 150 mcg tablet Take 150 mcg by mouth Daily (before breakfast).         ??? propranolol (INDERAL) 20 mg tablet Take 20 mg by mouth three (3) times daily.         ??? LORazepam (ATIVAN) 0.5 mg tablet Take 0.5 mg by mouth two (2) times a day.         ???  citalopram (CELEXA) 20 mg tablet Take 20 mg by mouth daily.         ??? buPROPion SR (WELLBUTRIN SR) 150 mg SR tablet Take 150 mg by mouth two (2) times a day.         ??? ergocalciferol (VITAMIN D2) 50,000 unit capsule Take 50,000 Units by mouth. Twice a month        ??? fluticasone (FLONASE) 50 mcg/actuation nasal spray 2 Sprays by Both Nostrils route daily.         ??? traZODone (DESYREL) 50 mg tablet 1 - 1 1/2 tablets at bedtime        ??? DOCUSATE SODIUM (COLACE PO) Take 250 mg by mouth two (2) times a day.         ??? PYRIDOXINE HCL (VITAMIN B-6 PO) Take 500 mg by mouth daily.         ??? cyanocobalamin (VITAMIN B-12) 1,000 mcg tablet Take 1,000 mcg by mouth daily.         ??? CALCIUM CITRATE-VITAMIN D3 PO Take 2 Caps by mouth daily.         ??? iron polysaccharides (NU-IRON) 150 mg capsule Take 150 mg by mouth daily.         ??? Cholecalciferol, Vitamin D3, (VITAMIN D3) 1,000 unit Cap Take 1 Tab by mouth daily.         ??? VIT A/VIT  C/VIT E/ZINC/COPPER (I-CAPS AREDS PO) Take 1 Tab by mouth daily.         ??? ranitidine (ZANTAC) 150 mg tablet Take 150 mg by mouth as needed.         ??? acetaminophen 650 mg Tab Take 1 Tab by mouth as needed.           No Known Allergies  Past Medical History   Diagnosis Date   ??? Thyroid disease    ??? Psychiatric disorder      depression   ??? Arrhythmia      PSVT   ??? Hypertension    ??? Other ill-defined conditions      TMJ   ??? Other ill-defined conditions      vitamin D deficiency   ??? Other ill-defined conditions      insomnia   ??? Other ill-defined conditions      carpel tunnel syndrome   ??? Other ill-defined conditions      iron deficiency   ??? Cancer 1979     breast cancer/ chemotherapy (1980-81)     Past Surgical History   Procedure Laterality Date   ??? Hx dilation and curettage  1975     excessive bleeding   ??? Hx hysterectomy  1976     fibroid tumor causing excessive bleeding   ??? Hx gi  1978     hemorrhoidectomy and repair of anal ulcer   ??? Hx orthopaedic  1995     right carpel tunnel surgery   ??? Hx orthopaedic  2005     arthroscopic right shoulder surgery - torn rotator cuff   ??? Hx orthopaedic  2010, 2011,2012     spinal steroid injection L4, L5- spinal stenosis   ??? Pr cardiac surg procedure unlist  2007     heart cath due to rapid heart rate   ??? Hx gastric bypass  2000, 2001     initial/repair   ??? Hx heent       macular degeneration   ??? Hx heent  2006     laser surgery to repair  left torn retina    ??? Pr breast surgery procedure unlisted  1979     left modified radical mastectomy - breast cancer   ??? Pr breast surgery procedure unlisted  1982     reconstruction     History reviewed. No pertinent family history.  History   Substance Use Topics   ??? Smoking status: Former Smoker   ??? Smokeless tobacco: Not on file   ??? Alcohol Use: Yes      Comment: socially        Review of Systems:   Constitutional: Negative for fever, chills, weight loss, + malaise/fatigue.   HEENT: Negative for nosebleeds, vision changes.   Respiratory:  Negative for cough, hemoptysis, sputum production, and wheezing.   Cardiovascular: Negative for chest pain, +palpitations, orthopnea, claudication, leg swelling, syncope, and PND.   Gastrointestinal: Negative for nausea, vomiting, diarrhea, constipation, blood in stool and melena.   Genitourinary: Negative for dysuria, and hematuria.   Musculoskeletal: Negative for myalgias, +arthralgia bilateral hips and back.   Skin: Negative for rash.   Heme: Does not bleed or bruise easily.   Neurological: Negative for speech change and focal weakness     Objective:     BP 130/74   Pulse 64   Ht 5\' 4"  (1.626 m)   Wt 195 lb (88.451 kg)   BMI 33.46 kg/m2   Physical Exam:   Constitutional: well-developed and well-nourished. No distress.   Head: Normocephalic and atraumatic.   Eyes: Pupils are equal, round  Neck: supple. No JVD present.   Cardiovascular: Normal rate, regular rhythm and normal heart sounds.  Exam reveals no gallop and no friction rub.  murmur heard.  Pulmonary/Chest: Effort normal and breath sounds normal. No wheezes.   Abdominal: Soft, no tenderness.   Musculoskeletal: no edema.   Neurological: alert,oriented.   Skin: Skin is warm and dry  Psychiatric: normal mood and affect. Behavior is normal. Judgment and thought content normal.      EKG: sinus rhythm, no preexcitation      Assessment/Plan:       ICD-9-CM    1. Palpitation 785.1 AMB POC EKG ROUTINE W/ 12 LEADS, INTER & REP   2. PAC (premature atrial contraction) 427.61    3. PVC (premature ventricular contraction) 427.69      It seens she has had symptoms with PSVT: No recorded SVT per holter or EKGs. She has been taking inderal for 8 years and this has provided control of palpitations and fast heart rate. The past year she has been having episodes of palpitations and fast heart. Her symptoms range from mild to severe, she is symptomatic with profuse sweating, weakness and palpitations occuring 2-3 times month. She would like to continue with the inderal and  think about having the EP study and ablation only if she is ready. Educational information given on Ep study.   Risks include but are not limited to bleeding in the groin, infection in the groin and/or heart valves, fistula between the groin arteries and veins, valvular damage, diaphragmatic paralysis, aortic dissection, heart attack, stroke, blood clot in the leg, pulmonary embolism, lung collapse (pneumothorax or hemothorax), heart collapse (pericardial tamponade), heart blocker and pacer or even death. The added risks for left atrial ablation may be atrial-esophageal fistula, pulmonary vein stenoses, kidney failure (from contrast injection), higher risk of bleeding, stroke and heart attack.  Elective or emergency surgery may be required to repair some of these complications. Prolonged hospitalization would be required.  General anesthesia and  transeptal catheterization may be required for the procedure    F/u: she will call if she decides to have Ep study and ablation done.     Thank you for involving me in this patient's care and please call with further concerns or questions.      Juliet Rude, M.D.  Electrophysiology/Cardiology  [facility]  [address]                      [address] [place], [place] [address]                             [place] [address]  [phone]                                        [phone]

## 2012-10-24 NOTE — Progress Notes (Deleted)
Cardiac Electrophysiology Consultation Note     Subjective:      Amy Galloway is a 77 yo patient who is seen for evaluation of PACs and PVCs kindly referred by Dr Salvadore Farber. She reports she was diagnosised with PSVT 8 years ago, after having a stress test. She has been taking propanol for this for 8 years now and it was controlling the PSVT.  The past year and half she has been under a lot of stress, she had back surgery, her husband suffered a major heart attack and she recently fell and fractured her left wrist. The episodes of fast heart rate and palpitations started occuring despite taking the propanolol.   They episodes do not have a particular trigger they happen when she is lying in bed and with activity.  They typically last 5-15 minutes, the most severe episode she has had lastd 30 minutes.  During the episodes of fast heart rate she profusely sweats, she feels weak, lightheadedness and dizziness.    No associated SOB or chest pain.  She usually just lays down until the episode passes and blast the air conditioner. She has not had the opportunity to try a vagal maneuver.   On average she is having the episode 2-3 times a month.   She has a past medical history of IBS, clotting disorder, hypertension, back surgery.   She had a cardiac cath at [facility] 8 years, reported mild blockages that were not bad enough to stents.     Echo 10/03/12 (done at [facility] in [place])  LVEF 60% NO RWA.   Trivial MR and TR.   Holter (48 hour) 10/03/12: Avg HR 71 BPM- NSR. 2 PVCs and 21 PACs. NO sustained arrhythmias.     Family hx: mother CVA 70's/colan cancer, father hx prostate cancer, Oldest brother MI 42 yo. Sister CHF in 7yo. Brother youngest- cardiomyopathy, viral etiology . Paternal and maternal grandfather Mi in 98's.  No hx ppm   Social hx: Quit 2 years ago, tobacco use x 50 years. Very occasional. Married. 3 children. Active but does not exercise, arthritis in both hips  . 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       There is no problem list on file for this patient.    There are no active problems to display for this patient.    Current Outpatient Prescriptions   Medication Sig Dispense Refill   ??? pantoprazole (PROTONIX) 20 mg tablet Take 20 mg by mouth daily.       ??? losartan (COZAAR) 25 mg tablet Take  by mouth daily.       ??? B INFANTIS/B ANI/B LON/B BIFID (PROBIOTIC 4X PO) Take  by mouth.       ??? aspirin (ASPIRIN) 325 mg tablet Take 325 mg by mouth daily.       ??? levothyroxine (LEVOXYL) 150 mcg tablet Take 150 mcg by mouth Daily (before breakfast).         ??? propranolol (INDERAL) 20 mg tablet Take 20 mg by mouth three (3) times daily.         ??? LORazepam (ATIVAN) 0.5 mg tablet Take 0.5 mg by mouth two (2) times a day.         ??? citalopram (CELEXA) 20 mg tablet Take 20 mg by mouth daily.         ??? buPROPion SR (WELLBUTRIN SR) 150 mg SR tablet Take 150 mg by mouth two (2) times a day.         ???  ergocalciferol (VITAMIN D2) 50,000 unit capsule Take 50,000 Units by mouth. Twice a month        ??? fluticasone (FLONASE) 50 mcg/actuation nasal spray 2 Sprays by Both Nostrils route daily.         ??? traZODone (DESYREL) 50 mg tablet 1 - 1 1/2 tablets at bedtime        ??? DOCUSATE SODIUM (COLACE PO) Take 250 mg by mouth two (2) times a day.         ??? PYRIDOXINE HCL (VITAMIN B-6 PO) Take 500 mg by mouth daily.         ??? cyanocobalamin (VITAMIN B-12) 1,000 mcg tablet Take 1,000 mcg by mouth daily.         ??? CALCIUM CITRATE-VITAMIN D3 PO Take 2 Caps by mouth daily.         ??? iron polysaccharides (NU-IRON) 150 mg capsule Take 150 mg by mouth daily.         ??? Cholecalciferol, Vitamin D3, (VITAMIN D3) 1,000 unit Cap Take 1 Tab by mouth daily.         ??? VIT A/VIT C/VIT E/ZINC/COPPER (I-CAPS AREDS PO) Take 1 Tab by mouth daily.         ??? ranitidine (ZANTAC) 150 mg tablet Take 150 mg by mouth as needed.         ??? acetaminophen 650 mg Tab Take 1 Tab by mouth as needed.           No Known Allergies  Past Medical  History   Diagnosis Date   ??? Thyroid disease    ??? Psychiatric disorder      depression   ??? Arrhythmia      PSVT   ??? Hypertension    ??? Other ill-defined conditions      TMJ   ??? Other ill-defined conditions      vitamin D deficiency   ??? Other ill-defined conditions      insomnia   ??? Other ill-defined conditions      carpel tunnel syndrome   ??? Other ill-defined conditions      iron deficiency   ??? Cancer 1979     breast cancer/ chemotherapy (1980-81)     Past Surgical History   Procedure Laterality Date   ??? Hx dilation and curettage  1975     excessive bleeding   ??? Hx hysterectomy  1976     fibroid tumor causing excessive bleeding   ??? Hx gi  1978     hemorrhoidectomy and repair of anal ulcer   ??? Hx orthopaedic  1995     right carpel tunnel surgery   ??? Hx orthopaedic  2005     arthroscopic right shoulder surgery - torn rotator cuff   ??? Hx orthopaedic  2010, 2011,2012     spinal steroid injection L4, L5- spinal stenosis   ??? Pr cardiac surg procedure unlist  2007     heart cath due to rapid heart rate   ??? Hx gastric bypass  2000, 2001     initial/repair   ??? Hx heent       macular degeneration   ??? Hx heent  2006     laser surgery to repair left torn retina    ??? Pr breast surgery procedure unlisted  1979     left modified radical mastectomy - breast cancer   ??? Pr breast surgery procedure unlisted  1982     reconstruction     History reviewed. No  pertinent family history.  History   Substance Use Topics   ??? Smoking status: Former Smoker   ??? Smokeless tobacco: Not on file   ??? Alcohol Use: Yes      Comment: socially        Review of Systems:   Constitutional: Negative for fever, chills, weight loss, + malaise/fatigue.   HEENT: Negative for nosebleeds, vision changes.   Respiratory: Negative for cough, hemoptysis, sputum production, and wheezing.   Cardiovascular: Negative for chest pain, +palpitations, orthopnea, claudication, leg swelling, syncope, and PND.   Gastrointestinal: Negative for nausea, vomiting, diarrhea,  constipation, blood in stool and melena.   Genitourinary: Negative for dysuria, and hematuria.   Musculoskeletal: Negative for myalgias, +arthralgia bilateral hips and back.   Skin: Negative for rash.   Heme: Does not bleed or bruise easily.   Neurological: Negative for speech change and focal weakness     Objective:     BP 130/74   Pulse 64   Ht 5\' 4"  (1.626 m)   Wt 195 lb (88.451 kg)   BMI 33.46 kg/m2   Physical Exam:   Constitutional: well-developed and well-nourished. No distress.   Head: Normocephalic and atraumatic.   Eyes: Pupils are equal, round  Neck: supple. No JVD present.   Cardiovascular: Normal rate, regular rhythm and normal heart sounds.  Exam reveals no gallop and no friction rub.  murmur heard.  Pulmonary/Chest: Effort normal and breath sounds normal. No wheezes.   Abdominal: Soft, no tenderness.   Musculoskeletal: no edema.   Neurological: alert,oriented.   Skin: Skin is warm and dry  Psychiatric: normal mood and affect. Behavior is normal. Judgment and thought content normal.      EKG: sinus rhythm        Assessment/Plan:   PSVT: No recorded SVT per holter or EKGs. She has been taking inderal for 8 years and this has provided control of palpitations and fast heart rate. The past year she has been having episodes of palpitations and fast heart. Her symptoms range from mild to severe, she is symptomatic with profuse sweating, weakness and palpitations occuring 2-3 times month. She would like to continue with the inderal and think about having the EP study if her symptoms start occuring more frequently. She had a cardiac cath 8 years ago that normal. Educational information given on Ep study.   Hypertension: Bp controlled. Continue losartan.     F/u: she will call if she decides to have Ep study done.     Thank you for involving me in this patient's care and please call with further concerns or questions.      Juliet Rude, M.D.  Electrophysiology/Cardiology  [facility]  [address]                      [address] [place], [place] [address]                             [place] [address]  [phone]                                        [phone]

## 2012-10-24 NOTE — Patient Instructions (Signed)
Call back to schedule procedure

## 2012-11-09 MED ADMIN — ondansetron (ZOFRAN ODT) tablet 4 mg: ORAL | @ 06:00:00 | NDC 68462015740

## 2012-11-09 MED ADMIN — morphine injection 4 mg: INTRAMUSCULAR | @ 06:00:00 | NDC 00409189101

## 2012-11-09 NOTE — ED Notes (Signed)
Patient states that she fell a few nights ago and was seen in [place] ER.  Patient states that she is in severe pain in her left hip and it is steadily increasing.

## 2012-11-09 NOTE — ED Provider Notes (Signed)
HPI Comments: 77 year old female states that she fell on Tuesday seen at [facility] that night Dx with possible new L2 compression fracture told nothing else broken states continues with back pain but pain is primarily in left hip worse with walking states pain is getting worse note better with pain meds taking norco no relief states started taking 2 pills 5/325 with minimal relief  No incontinent of urine or stool able to walk with pain and needs walker   patient states previous surgery on L4-5 by Dr crowl patients ortho doctor     Patient is a 77 y.o. female presenting with fall and hip pain. The history is provided by the patient.   Fall  The accident occurred more than 2 days ago. The fall occurred while walking. She fell from a height of ground level. She landed on hard floor. There was no blood loss. Point of impact: left hip. Pain location: left hip and back  The pain is at a severity of 6/10. The pain is moderate. She was ambulatory at the scene. There was no drug use involved in the accident. Pertinent negatives include no visual change, no numbness, no abdominal pain, no vomiting, no hematuria, no headaches, no loss of consciousness, no tingling and no laceration. She has tried nothing for the symptoms.   Hip Injury   Associated symptoms include back pain. Pertinent negatives include no numbness, no tingling and no neck pain.        Past Medical History   Diagnosis Date   ??? Thyroid disease    ??? Psychiatric disorder      depression   ??? Arrhythmia      PSVT   ??? Hypertension    ??? Other ill-defined conditions      TMJ   ??? Other ill-defined conditions      vitamin D deficiency   ??? Other ill-defined conditions      insomnia   ??? Other ill-defined conditions      carpel tunnel syndrome   ??? Other ill-defined conditions      iron deficiency   ??? Cancer 1979     breast cancer/ chemotherapy (1980-81)   ??? Compression fx, lumbar spine         Past Surgical History   Procedure Laterality Date   ??? Hx dilation and  curettage  1975     excessive bleeding   ??? Hx hysterectomy  1976     fibroid tumor causing excessive bleeding   ??? Hx gi  1978     hemorrhoidectomy and repair of anal ulcer   ??? Hx orthopaedic  1995     right carpel tunnel surgery   ??? Hx orthopaedic  2005     arthroscopic right shoulder surgery - torn rotator cuff   ??? Hx orthopaedic  2010, 2011,2012     spinal steroid injection L4, L5- spinal stenosis   ??? Pr cardiac surg procedure unlist  2007     heart cath due to rapid heart rate   ??? Hx gastric bypass  2000, 2001     initial/repair   ??? Hx heent       macular degeneration   ??? Hx heent  2006     laser surgery to repair left torn retina    ??? Pr breast surgery procedure unlisted  1979     left modified radical mastectomy - breast cancer   ??? Pr breast surgery procedure unlisted  1982     reconstruction   ???  Hx other surgical       Surgery L4-L5         History reviewed. No pertinent family history.     History     Social History   ??? Marital Status: MARRIED     Spouse Name: N/A     Number of Children: N/A   ??? Years of Education: N/A     Occupational History   ??? Not on file.     Social History Main Topics   ??? Smoking status: Former Smoker   ??? Smokeless tobacco: Not on file   ??? Alcohol Use: Yes      Comment: socially   ??? Drug Use: No   ??? Sexually Active: Not on file     Other Topics Concern   ??? Not on file     Social History Narrative   ??? No narrative on file                  ALLERGIES: Review of patient's allergies indicates no known allergies.      Review of Systems   Constitutional: Negative for appetite change and unexpected weight change.   HENT: Negative for facial swelling, rhinorrhea, trouble swallowing, neck pain, dental problem and ear discharge.    Eyes: Negative for pain and discharge.   Respiratory: Negative for apnea and stridor.    Cardiovascular: Negative for chest pain, palpitations and leg swelling.   Gastrointestinal: Negative for vomiting, abdominal pain, diarrhea, blood in stool and abdominal distention.    Genitourinary: Negative for dysuria, hematuria, flank pain and difficulty urinating.   Musculoskeletal: Positive for back pain and joint swelling. Negative for myalgias and arthralgias.   Skin: Negative for color change, rash and wound.   Neurological: Negative for tingling, loss of consciousness, facial asymmetry, speech difficulty, numbness and headaches.   Hematological: Negative for adenopathy.   Psychiatric/Behavioral: Negative for suicidal ideas, hallucinations, behavioral problems, self-injury and agitation.       Filed Vitals:    11/09/12 0126   BP: 164/76   Pulse: 65   Temp: 98.5 ??F (36.9 ??C)   Resp: 16   Height: 5\' 4"  (1.626 m)   Weight: 82.555 kg (182 lb)   SpO2: 95%            Physical Exam   Nursing note and vitals reviewed.  Constitutional: She is oriented to person, place, and time. She appears well-developed and well-nourished. She appears distressed.   HENT:   Head: Normocephalic and atraumatic.   Eyes: Conjunctivae and EOM are normal. Pupils are equal, round, and reactive to light. Right eye exhibits no discharge. Left eye exhibits no discharge. No scleral icterus.   Neck: Normal range of motion.   Cardiovascular: Normal rate, regular rhythm and normal heart sounds.    Pulmonary/Chest: Effort normal and breath sounds normal. No respiratory distress. She has no wheezes.   Abdominal: Soft. Bowel sounds are normal. She exhibits no distension. There is no tenderness.   Musculoskeletal: She exhibits tenderness. She exhibits no edema.        Left hip: She exhibits decreased range of motion, tenderness and bony tenderness.        Lumbar back: She exhibits tenderness.   Slight pain with SLR but good patellar reflexes noted    Neurological: She is alert and oriented to person, place, and time. No cranial nerve deficit. Coordination normal.   Skin: Skin is warm. No laceration noted. No erythema.        MDM  Differential Diagnosis; Clinical Impression; Plan:     DDX; fracture, strain,sciatica   Amount  and/or Complexity of Data Reviewed:   Tests in the radiology section of CPT??:  Ordered and reviewed      Procedures    Patient care handed off to attending for disposition after imaging results

## 2012-11-09 NOTE — ED Notes (Signed)
Back brace ordered applied by Sibyl Parr.

## 2012-11-09 NOTE — ED Notes (Addendum)
Becky PA at bedside assessing the pt. Pt reports L2 compression fx.  Hx of surgery to L4-L5.

## 2012-11-09 NOTE — ED Notes (Signed)
Ortho is calling to floor to attempt to find a brace for pt but regardless sts pt is ok to go home will swtich pain meds and have her f/u with Dr. Almira Bar

## 2012-11-09 NOTE — ED Provider Notes (Signed)
I personally saw and evaluated the patient.  I have reviewed and agree with the MLP's findings, including all diagnostic interpretations, and plans as written.    Marie Borowski H Abrian Hanover, MD

## 2012-11-09 NOTE — ED Notes (Signed)
Discharge instructions given with three scripts.  Education provided on follow, medications, not to drive under the influence of narcotics and when to return to the ED.  Pt is taken to the lobby via wheelchair.  Pt and family verbalize understanding.

## 2012-11-09 NOTE — ED Notes (Signed)
Pt in NAD, ortho consult to be called.

## 2012-11-23 NOTE — Progress Notes (Signed)
Pt states she will be in touch if she wants to proceed with ablation, but it will be months

## 2012-11-23 NOTE — Telephone Encounter (Signed)
Verified patient identity with two identifiers. Spoke with patient to follow up from office visit.  Pt fell in June, broke wrist and still dealing with issues from that.  She fell again recently and has a compression fracture in back.  She may need surgery for one or both of the injures. Pt states she will be in touch if she wants to proceed with ablation, but it will be months. Informed pt to call with any cardiac questions or concerns. Marland Kitchenuns

## 2012-11-24 ENCOUNTER — Encounter (HOSPITAL_COMMUNITY): Payer: Self-pay | Admitting: *Deleted

## 2012-11-24 ENCOUNTER — Emergency Department (HOSPITAL_COMMUNITY)
Admission: EM | Admit: 2012-11-24 | Discharge: 2012-11-24 | Disposition: A | Discharge status: Home / Self Care | Payer: Medicare Other | Attending: Emergency Medicine | Admitting: Emergency Medicine

## 2012-11-24 DIAGNOSIS — Z862 Personal history of diseases of the blood and blood-forming organs and certain disorders involving the immune mechanism: Secondary | ICD-10-CM | POA: Insufficient documentation

## 2012-11-24 DIAGNOSIS — F17200 Nicotine dependence, unspecified, uncomplicated: Secondary | ICD-10-CM | POA: Insufficient documentation

## 2012-11-24 DIAGNOSIS — N390 Urinary tract infection, site not specified: Secondary | ICD-10-CM | POA: Insufficient documentation

## 2012-11-24 DIAGNOSIS — Z79899 Other long term (current) drug therapy: Secondary | ICD-10-CM | POA: Insufficient documentation

## 2012-11-24 DIAGNOSIS — Z860100 Personal history of colon polyps, unspecified: Secondary | ICD-10-CM | POA: Insufficient documentation

## 2012-11-24 DIAGNOSIS — F411 Generalized anxiety disorder: Secondary | ICD-10-CM | POA: Insufficient documentation

## 2012-11-24 DIAGNOSIS — Z87442 Personal history of urinary calculi: Secondary | ICD-10-CM | POA: Insufficient documentation

## 2012-11-24 DIAGNOSIS — Z85118 Personal history of other malignant neoplasm of bronchus and lung: Secondary | ICD-10-CM | POA: Insufficient documentation

## 2012-11-24 DIAGNOSIS — Z8639 Personal history of other endocrine, nutritional and metabolic disease: Secondary | ICD-10-CM | POA: Insufficient documentation

## 2012-11-24 DIAGNOSIS — Z8571 Personal history of Hodgkin lymphoma: Secondary | ICD-10-CM | POA: Insufficient documentation

## 2012-11-24 DIAGNOSIS — E119 Type 2 diabetes mellitus without complications: Secondary | ICD-10-CM | POA: Insufficient documentation

## 2012-11-24 DIAGNOSIS — Z923 Personal history of irradiation: Secondary | ICD-10-CM | POA: Insufficient documentation

## 2012-11-24 DIAGNOSIS — J441 Chronic obstructive pulmonary disease with (acute) exacerbation: Secondary | ICD-10-CM | POA: Insufficient documentation

## 2012-11-24 DIAGNOSIS — M1990 Unspecified osteoarthritis, unspecified site: Secondary | ICD-10-CM | POA: Insufficient documentation

## 2012-11-24 LAB — URINALYSIS, ROUTINE W REFLEX MICROSCOPIC
Glucose, UA: NEGATIVE mg/dL
Protein, ur: 100 mg/dL — AB
Urobilinogen, UA: 0.2 mg/dL (ref 0.0–1.0)

## 2012-11-24 LAB — URINE MICROSCOPIC-ADD ON

## 2012-11-24 MED ORDER — PHENAZOPYRIDINE HCL 200 MG PO TABS: 200.0000 mg | ORAL_TABLET | Freq: Three times a day (TID) | ORAL | Status: DC

## 2012-11-24 MED ORDER — PHENAZOPYRIDINE HCL 100 MG PO TABS
200.0000 mg | ORAL_TABLET | Freq: Once | ORAL | Status: AC
  Administered 2012-11-24: 200 mg via ORAL
  Filled 2012-11-24: qty 2

## 2012-11-24 MED ORDER — MORPHINE SULFATE 10 MG/ML IJ SOLN
10.0000 mg | Freq: Once | INTRAMUSCULAR | Status: DC
  Filled 2012-11-24: qty 1

## 2012-11-24 MED ORDER — NITROFURANTOIN MONOHYD MACRO 100 MG PO CAPS: 100.0000 mg | ORAL_CAPSULE | Freq: Two times a day (BID) | ORAL | Status: DC

## 2012-11-24 MED ORDER — NITROFURANTOIN MONOHYD MACRO 100 MG PO CAPS
100.0000 mg | ORAL_CAPSULE | Freq: Once | ORAL | Status: AC
  Administered 2012-11-24: 100 mg via ORAL
  Filled 2012-11-24: qty 1

## 2012-11-24 MED ORDER — HYDROCODONE-ACETAMINOPHEN 5-325 MG PO TABS
1.0000 | ORAL_TABLET | Freq: Once | ORAL | Status: AC
  Administered 2012-11-24: 1 via ORAL
  Filled 2012-11-24: qty 1

## 2012-11-24 NOTE — ED Notes (Signed)
Pt c/o right side lower back pain that radiates to right pelvic area. Pt also reports hematuria. Pt has hx of kidney stones but states "this doesn't feel like past episodes".

## 2012-11-24 NOTE — ED Notes (Signed)
Dysuria and hematuria beginning this morning.  C/O R back and flank pain. Has seen urologist months ago for hematuria and was told she had small renal stone.  Doesn't know if she ever passed it. Hx of lung and skin CA.

## 2012-11-24 NOTE — ED Provider Notes (Signed)
CSN: [id number]     Arrival date & time 11/24/12  0931 History   This chart was scribed for Roney Marion, MD by Quintella Reichert, ED scribe.  This patient was seen in room [facility]11/[facility]11 and the patient's care was started at 10:06 AM.   Chief Complaint  Patient presents with  . Hematuria    The history is provided by the patient. No language interpreter was used.    HPI Comments: Marie Holmes is a 77 y.o. female with h/o kidney stone, lung cancer, non-hodgkin's lymphoma, and COPD who presents to the Emergency Department complaining of persistent hematuria that began this morning with associated dysuria, frequency and decreased urinary output.  Pt describes urine as "pure blood."  She states she has urinated several times this morning in small amounts and it has been blood each time.  She also complains of pain in her right side extending into her back.  She notes that 3-4 months ago she experienced some mild hematuria and was diagnosed with a small kidney stone based on CT-scan.  She was advised that no intervention was required and she could likely pass the stone without difficulty.  Pt also complains of SOB and wheezing although this is a chronic issue for her.   Past Medical History  Diagnosis Date  . Benign tumor of breast 2004    left  . Colon polyp   . Broken ribs     history of several broken ribs on right and compression  fx  . Fibroma of skin     Multiple fibromas/lipomas on arms and legs B  . Pyelonephritis   . Anxiety   . COPD (chronic obstructive pulmonary disease)   . Hyperlipidemia   . Osteoarthritis   . History of CT scan of abdomen 05/23/2003    Mild fatty liver inf, tiny hypodensity in right lobe of liver  . Kidney stone 02/21/1981    Right/removed  . Pulmonary nodules   . Diabetes mellitus without complication   . Cancer     non-hodgkins b cell lymphoma  . 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 Hx of radiation  therapy 04/11/12- 05/08/12    left chest wall area, 40 gray 20 fx    Past Surgical History  Procedure Laterality Date  . Appendectomy  02/21/1961  . Exploratory laparotomy  02/21/1970    for ? adhesions  . Abdominal hysterectomy  02/21/1970    right ovary remaining  . Bone marrow biopsy  03/09/2012  . Lung core biopsy  02/06/2012    Family History  Problem Relation Age of Onset  . Parkinsonism Brother   . Stroke Father   . Cancer Sister     History  Substance Use Topics  . Smoking status: Current Every Day Smoker -- 0.50 packs/day for 30 years    Types: Cigarettes  . Smokeless tobacco: Never Used     Comment: 3/4 pack X 50 years  . Alcohol Use: No    OB History   Grav Para Term Preterm Abortions TAB SAB Ect Mult Living                  Review of Systems  Constitutional: Negative for fever, chills, diaphoresis, appetite change and fatigue.  HENT: Negative for sore throat, mouth sores and trouble swallowing.   Eyes: Negative for visual disturbance.  Respiratory: Positive for shortness of breath (chronic) and wheezing (chronic). Negative for cough and chest tightness.  Cardiovascular: Negative for chest pain.  Gastrointestinal: Negative for nausea, vomiting, abdominal pain, diarrhea and abdominal distention.  Endocrine: Negative for polydipsia, polyphagia and polyuria.  Genitourinary: Positive for dysuria, frequency, hematuria, flank pain and decreased urine volume.  Musculoskeletal: Negative for gait problem.  Skin: Negative for color change, pallor and rash.  Neurological: Negative for dizziness, syncope, light-headedness and headaches.  Hematological: Does not bruise/bleed easily.  Psychiatric/Behavioral: Negative for behavioral problems and confusion.     Allergies  Sulfonamide derivatives and Chantix  Home Medications   Current Outpatient Rx  Name  Route  Sig  Dispense  Refill  . albuterol (PROVENTIL HFA;VENTOLIN HFA) 108 (90 BASE) MCG/ACT inhaler    Inhalation   Inhale 2 puffs into the lungs every 4 (four) hours as needed for wheezing.   1 Inhaler   1   . ALPRAZolam (XANAX) 0.5 MG tablet   Oral   Take 1 tablet (0.5 mg total) by mouth 3 (three) times daily as needed for sleep or anxiety.   90 tablet   0   . aspirin EC 81 MG tablet   Oral   Take 81 mg by mouth every morning.         . citalopram (CELEXA) 40 MG tablet   Oral   Take 1 tablet (40 mg total) by mouth every evening.   30 tablet   6   . Fluticasone-Salmeterol (ADVAIR DISKUS) 250-50 MCG/DOSE AEPB   Inhalation   Inhale 1 puff into the lungs 2 (two) times daily.   1 each   3   . ibuprofen (ADVIL,MOTRIN) 200 MG tablet   Oral   Take 200 mg by mouth every 8 (eight) hours as needed. For pain         . losartan (COZAAR) 50 MG tablet   Oral   Take 1 tablet (50 mg total) by mouth every morning.   30 tablet   6   . metFORMIN (GLUCOPHAGE) 1000 MG tablet   Oral   Take 1 tablet (1,000 mg total) by mouth 2 (two) times daily with a meal.   180 tablet   1   . metoprolol succinate (TOPROL-XL) 100 MG 24 hr tablet   Oral   Take 1 tablet (100 mg total) by mouth every morning.   30 tablet   6   . Multiple Vitamin (MULTIVITAMIN) tablet   Oral   Take 1 tablet by mouth daily at 12 noon.          . vitamin B-12 (CYANOCOBALAMIN) 500 MCG tablet   Oral   Take 500 mcg by mouth daily.         . nitrofurantoin, macrocrystal-monohydrate, (MACROBID) 100 MG capsule   Oral   Take 1 capsule (100 mg total) by mouth 2 (two) times daily.   20 capsule   0   . phenazopyridine (PYRIDIUM) 200 MG tablet   Oral   Take 1 tablet (200 mg total) by mouth 3 (three) times daily.   6 tablet   0    BP 142/62  Pulse 103  Temp(Src) 97.9 F (36.6 C) (Oral)  Resp 17  Ht 5\' 2"  (1.575 m)  Wt 118 lb (53.524 kg)  BMI 21.58 kg/m2  SpO2 94%  Physical Exam  Nursing note and vitals reviewed. Constitutional: She is oriented to person, place, and time. She appears well-developed  and well-nourished. No distress.  HENT:  Head: Normocephalic.  Moist mucous membranes  Eyes: Conjunctivae are normal. Pupils are equal, round, and reactive to  light. No scleral icterus.  Neck: Normal range of motion. Neck supple. No thyromegaly present.  Cardiovascular: Normal rate, regular rhythm and normal heart sounds.  Exam reveals no gallop and no friction rub.   No murmur heard. Pulmonary/Chest: Effort normal and breath sounds normal. No respiratory distress. She has no wheezes. She has no rales.  Abdominal: Soft. Bowel sounds are normal. She exhibits no distension. There is tenderness (suprapubic). There is no rebound.  Musculoskeletal: Normal range of motion.  Neurological: She is alert and oriented to person, place, and time.  Skin: Skin is warm and dry. No rash noted.  Psychiatric: She has a normal mood and affect. Her behavior is normal.    ED Course  Procedures (including critical care time)  DIAGNOSTIC STUDIES: Oxygen Saturation is 94% on room air, adequate by my interpretation.    COORDINATION OF CARE: 10:11 AM-Discussed treatment plan which includes UA with pt at bedside and pt agreed to plan.    Labs Review Labs Reviewed  URINALYSIS, ROUTINE W REFLEX MICROSCOPIC - Abnormal; Notable for the following:    Color, Urine RED (*)    APPearance TURBID (*)    Specific Gravity, Urine >1.030 (*)    Hgb urine dipstick LARGE (*)    Ketones, ur TRACE (*)    Protein, ur 100 (*)    Leukocytes, UA TRACE (*)    All other components within normal limits  URINE MICROSCOPIC-ADD ON - Abnormal; Notable for the following:    Squamous Epithelial / LPF FEW (*)    Bacteria, UA FEW (*)    All other components within normal limits  URINE CULTURE   Imaging Review No results found.  MDM   1. Urinary tract infection    Urine findings consistent urinary tract infection and hemorrhagic cystitis. Plan will be outpatient antibiotic treatment and close followup with her physician.  Pyridium for pain.    I personally performed the services described in this documentation, which was scribed in my presence. The recorded information has been reviewed and is accurate.    Roney Marion, MD 11/25/12 [phone]

## 2012-11-26 LAB — URINE CULTURE: Colony Count: >=100000

## 2012-11-27 ENCOUNTER — Telehealth (HOSPITAL_COMMUNITY): Payer: Self-pay | Admitting: Emergency Medicine

## 2012-11-28 ENCOUNTER — Emergency Department (HOSPITAL_COMMUNITY)
Admission: EM | Admit: 2012-11-28 | Discharge: 2012-11-28 | Disposition: A | Discharge status: Home / Self Care | Payer: Medicare Other | Attending: Emergency Medicine | Admitting: Emergency Medicine

## 2012-11-28 ENCOUNTER — Emergency Department (HOSPITAL_COMMUNITY): Payer: Medicare Other

## 2012-11-28 ENCOUNTER — Encounter (HOSPITAL_COMMUNITY): Payer: Self-pay | Admitting: Emergency Medicine

## 2012-11-28 DIAGNOSIS — Z7982 Long term (current) use of aspirin: Secondary | ICD-10-CM | POA: Insufficient documentation

## 2012-11-28 DIAGNOSIS — Z872 Personal history of diseases of the skin and subcutaneous tissue: Secondary | ICD-10-CM | POA: Insufficient documentation

## 2012-11-28 DIAGNOSIS — Z87441 Personal history of nephrotic syndrome: Secondary | ICD-10-CM | POA: Insufficient documentation

## 2012-11-28 DIAGNOSIS — Z79899 Other long term (current) drug therapy: Secondary | ICD-10-CM | POA: Insufficient documentation

## 2012-11-28 DIAGNOSIS — R059 Cough, unspecified: Secondary | ICD-10-CM | POA: Insufficient documentation

## 2012-11-28 DIAGNOSIS — I10 Essential (primary) hypertension: Secondary | ICD-10-CM | POA: Insufficient documentation

## 2012-11-28 DIAGNOSIS — E119 Type 2 diabetes mellitus without complications: Secondary | ICD-10-CM | POA: Insufficient documentation

## 2012-11-28 DIAGNOSIS — R109 Unspecified abdominal pain: Secondary | ICD-10-CM | POA: Diagnosis present

## 2012-11-28 DIAGNOSIS — J449 Chronic obstructive pulmonary disease, unspecified: Secondary | ICD-10-CM | POA: Insufficient documentation

## 2012-11-28 DIAGNOSIS — C8590 Non-Hodgkin lymphoma, unspecified, unspecified site: Secondary | ICD-10-CM | POA: Diagnosis present

## 2012-11-28 DIAGNOSIS — R112 Nausea with vomiting, unspecified: Secondary | ICD-10-CM | POA: Insufficient documentation

## 2012-11-28 DIAGNOSIS — Z85118 Personal history of other malignant neoplasm of bronchus and lung: Secondary | ICD-10-CM | POA: Insufficient documentation

## 2012-11-28 DIAGNOSIS — Z8742 Personal history of other diseases of the female genital tract: Secondary | ICD-10-CM | POA: Insufficient documentation

## 2012-11-28 DIAGNOSIS — R05 Cough: Secondary | ICD-10-CM | POA: Insufficient documentation

## 2012-11-28 DIAGNOSIS — J4489 Other specified chronic obstructive pulmonary disease: Secondary | ICD-10-CM | POA: Insufficient documentation

## 2012-11-28 DIAGNOSIS — F17200 Nicotine dependence, unspecified, uncomplicated: Secondary | ICD-10-CM | POA: Insufficient documentation

## 2012-11-28 DIAGNOSIS — Z87828 Personal history of other (healed) physical injury and trauma: Secondary | ICD-10-CM | POA: Insufficient documentation

## 2012-11-28 DIAGNOSIS — Z923 Personal history of irradiation: Secondary | ICD-10-CM | POA: Insufficient documentation

## 2012-11-28 DIAGNOSIS — R319 Hematuria, unspecified: Secondary | ICD-10-CM | POA: Insufficient documentation

## 2012-11-28 DIAGNOSIS — F411 Generalized anxiety disorder: Secondary | ICD-10-CM | POA: Insufficient documentation

## 2012-11-28 DIAGNOSIS — M1990 Unspecified osteoarthritis, unspecified site: Secondary | ICD-10-CM | POA: Insufficient documentation

## 2012-11-28 DIAGNOSIS — Z860100 Personal history of colon polyps, unspecified: Secondary | ICD-10-CM | POA: Insufficient documentation

## 2012-11-28 DIAGNOSIS — C8589 Other specified types of non-Hodgkin lymphoma, extranodal and solid organ sites: Secondary | ICD-10-CM | POA: Insufficient documentation

## 2012-11-28 DIAGNOSIS — Z87442 Personal history of urinary calculi: Secondary | ICD-10-CM | POA: Insufficient documentation

## 2012-11-28 HISTORY — DX: Essential (primary) hypertension: I10

## 2012-11-28 LAB — URINALYSIS, ROUTINE W REFLEX MICROSCOPIC
Leukocytes, UA: NEGATIVE
Nitrite: NEGATIVE
Specific Gravity, Urine: 1.016 (ref 1.005–1.030)
Urobilinogen, UA: 0.2 mg/dL (ref 0.0–1.0)

## 2012-11-28 LAB — COMPREHENSIVE METABOLIC PANEL
AST: 15 U/L (ref 0–37)
Albumin: 3.8 g/dL (ref 3.5–5.2)
BUN: 13 mg/dL (ref 6–23)
Calcium: 9.7 mg/dL (ref 8.4–10.5)
Chloride: 99 mEq/L (ref 96–112)
Creatinine, Ser: 0.66 mg/dL (ref 0.50–1.10)
Total Protein: 6.8 g/dL (ref 6.0–8.3)

## 2012-11-28 LAB — CG4 I-STAT (LACTIC ACID): Lactic Acid, Venous: 1.31 mmol/L (ref 0.5–2.2)

## 2012-11-28 LAB — CBC WITH DIFFERENTIAL/PLATELET
Basophils Absolute: 0 10*3/uL (ref 0.0–0.1)
Basophils Relative: 0 % (ref 0–1)
Eosinophils Absolute: 0.1 10*3/uL (ref 0.0–0.7)
Eosinophils Relative: 2 % (ref 0–5)
HCT: 39.1 % (ref 36.0–46.0)
Hemoglobin: 13.5 g/dL (ref 12.0–15.0)
MCH: 31.5 pg (ref 26.0–34.0)
MCHC: 34.5 g/dL (ref 30.0–36.0)
Monocytes Absolute: 0.8 10*3/uL (ref 0.1–1.0)
Monocytes Relative: 10 % (ref 3–12)
Neutro Abs: 5.5 10*3/uL (ref 1.7–7.7)
Platelets: 294 10*3/uL (ref 150–400)
RDW: 12.8 % (ref 11.5–15.5)

## 2012-11-28 LAB — PROTIME-INR
INR: 0.93 (ref 0.00–1.49)
Prothrombin Time: 12.3 seconds (ref 11.6–15.2)

## 2012-11-28 LAB — PRO B NATRIURETIC PEPTIDE: Pro B Natriuretic peptide (BNP): 302.2 pg/mL (ref 0–450)

## 2012-11-28 LAB — TROPONIN I: Troponin I: <0.3 ng/mL (ref <0.30)

## 2012-11-28 MED ORDER — ONDANSETRON 8 MG PO TBDP: 8.0000 mg | ORAL_TABLET | Freq: Three times a day (TID) | ORAL | Status: DC | PRN

## 2012-11-28 MED ORDER — IOHEXOL 300 MG/ML  SOLN
50.0000 mL | Freq: Once | INTRAMUSCULAR | Status: AC | PRN
  Administered 2012-11-28: 50 mL via ORAL

## 2012-11-28 MED ORDER — HYDROMORPHONE HCL PF 1 MG/ML IJ SOLN
1.0000 mg | Freq: Once | INTRAMUSCULAR | Status: AC
  Administered 2012-11-28: 0.5 mg via INTRAVENOUS
  Filled 2012-11-28: qty 1

## 2012-11-28 MED ORDER — HYDROCODONE-ACETAMINOPHEN 5-325 MG PO TABS: 1.0000 | ORAL_TABLET | Freq: Four times a day (QID) | ORAL | Status: DC | PRN

## 2012-11-28 MED ORDER — IOHEXOL 300 MG/ML  SOLN
80.0000 mL | Freq: Once | INTRAMUSCULAR | Status: AC | PRN
  Administered 2012-11-28: 80 mL via INTRAVENOUS

## 2012-11-28 MED ORDER — ONDANSETRON HCL 4 MG/2ML IJ SOLN
4.0000 mg | Freq: Once | INTRAMUSCULAR | Status: AC
  Administered 2012-11-28: 4 mg via INTRAVENOUS
  Filled 2012-11-28: qty 2

## 2012-11-28 MED ORDER — SODIUM CHLORIDE 0.9 % IV BOLUS (SEPSIS)
1000.0000 mL | Freq: Once | INTRAVENOUS | Status: AC
  Administered 2012-11-28: 1000 mL via INTRAVENOUS

## 2012-11-28 NOTE — ED Provider Notes (Signed)
CSN: [id number]     Arrival date & time 11/28/12  1232 History   First MD Initiated Contact with Patient 11/28/12 1250     Chief Complaint  Patient presents with  . Abdominal Pain  . Hematuria   (Consider location/radiation/quality/duration/timing/severity/associated sxs/prior Treatment) HPI Comments: 77 yo very pleasant female with h/o COPD, DM, HTN and recent diagnosis of early lymphoid CA here for abd pain. Pt reports that she has been having abd pain since Saturday, right sided, with some nausea and emesis. Pt had 2 episodes of emesis today. Pt also has had some occasional bloody stools, BRBPR. She had uti like sx and hematuria, which has improved, with antibiotics, but her abd pain has persisted.   Patient is a 77 y.o. female presenting with abdominal pain and hematuria. The history is provided by the patient.  Abdominal Pain Associated symptoms: cough, hematuria, nausea and vomiting   Associated symptoms: no chest pain and no dysuria   Hematuria Associated symptoms include abdominal pain. Pertinent negatives include no chest pain and no headaches.    Past Medical History  Diagnosis Date  . Benign tumor of breast 2004    left  . Colon polyp   . Broken ribs     history of several broken ribs on right and compression  fx  . Fibroma of skin     Multiple fibromas/lipomas on arms and legs B  . Pyelonephritis   . Anxiety   . COPD (chronic obstructive pulmonary disease)   . Hyperlipidemia   . Osteoarthritis   . History of CT scan of abdomen 05/23/2003    Mild fatty liver inf, tiny hypodensity in right lobe of liver  . Kidney stone 02/21/1981    Right/removed  . Pulmonary nodules   . Diabetes mellitus without complication   . Cancer     non-hodgkins b cell lymphoma  . 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 Hx of radiation therapy 04/11/12- 05/08/12    left chest wall area, 40 gray 20 fx  . Hypertension    Past Surgical History   Procedure Laterality Date  . Appendectomy  02/21/1961  . Exploratory laparotomy  02/21/1970    for ? adhesions  . Abdominal hysterectomy  02/21/1970    right ovary remaining  . Bone marrow biopsy  03/09/2012  . Lung core biopsy  02/06/2012   Family History  Problem Relation Age of Onset  . Parkinsonism Brother   . Stroke Father   . Cancer Sister    History  Substance Use Topics  . Smoking status: Current Every Day Smoker -- 0.50 packs/day for 30 years    Types: Cigarettes  . Smokeless tobacco: Never Used     Comment: 3/4 pack X 50 years  . Alcohol Use: No   OB History   Grav Para Term Preterm Abortions TAB SAB Ect Mult Living                 Review of Systems  Constitutional: Negative for activity change.  Respiratory: Positive for cough.   Cardiovascular: Negative for chest pain.  Gastrointestinal: Positive for nausea, vomiting, abdominal pain and blood in stool.  Genitourinary: Positive for hematuria. Negative for dysuria.  Musculoskeletal: Negative for neck pain.  Neurological: Negative for headaches.    Allergies  Sulfonamide derivatives and Chantix  Home Medications   Current Outpatient Rx  Name  Route  Sig  Dispense  Refill  . albuterol (PROVENTIL HFA;VENTOLIN HFA) 108 (  90 BASE) MCG/ACT inhaler   Inhalation   Inhale 2 puffs into the lungs every 4 (four) hours as needed for wheezing.   1 Inhaler   1   . ALPRAZolam (XANAX) 0.5 MG tablet   Oral   Take 1 tablet (0.5 mg total) by mouth 3 (three) times daily as needed for sleep or anxiety.   90 tablet   0   . aspirin EC 81 MG tablet   Oral   Take 81 mg by mouth every morning.         . citalopram (CELEXA) 40 MG tablet   Oral   Take 1 tablet (40 mg total) by mouth every evening.   30 tablet   6   . Fluticasone-Salmeterol (ADVAIR DISKUS) 250-50 MCG/DOSE AEPB   Inhalation   Inhale 1 puff into the lungs 2 (two) times daily.   1 each   3   . ibuprofen (ADVIL,MOTRIN) 200 MG tablet   Oral   Take  400 mg by mouth every 8 (eight) hours as needed for pain. For pain         . losartan (COZAAR) 50 MG tablet   Oral   Take 1 tablet (50 mg total) by mouth every morning.   30 tablet   6   . metFORMIN (GLUCOPHAGE) 1000 MG tablet   Oral   Take 1 tablet (1,000 mg total) by mouth 2 (two) times daily with a meal.   180 tablet   1   . metoprolol succinate (TOPROL-XL) 100 MG 24 hr tablet   Oral   Take 1 tablet (100 mg total) by mouth every morning.   30 tablet   6   . Multiple Vitamin (MULTIVITAMIN) tablet   Oral   Take 1 tablet by mouth daily at 12 noon.          . nitrofurantoin, macrocrystal-monohydrate, (MACROBID) 100 MG capsule   Oral   Take 100 mg by mouth 2 (two) times daily.         . vitamin B-12 (CYANOCOBALAMIN) 500 MCG tablet   Oral   Take 500 mcg by mouth daily.         . phenazopyridine (PYRIDIUM) 200 MG tablet   Oral   Take 1 tablet (200 mg total) by mouth 3 (three) times daily.   6 tablet   0    BP 105/59  Pulse 72  Temp(Src) 98.3 F (36.8 C) (Oral)  Resp 16  SpO2 93% Physical Exam  Nursing note and vitals reviewed. Constitutional: She is oriented to person, place, and time. She appears well-developed and well-nourished.  HENT:  Head: Normocephalic and atraumatic.  Eyes: EOM are normal. Pupils are equal, round, and reactive to light.  Neck: Neck supple.  Cardiovascular: Normal rate, regular rhythm and normal heart sounds.   No murmur heard. Pulmonary/Chest: Effort normal. No respiratory distress.  Abdominal: Soft. She exhibits no distension. There is tenderness. There is no rebound and no guarding.  RLQ and RUQ tenderness, + guarding, no rebound.  Neurological: She is alert and oriented to person, place, and time.  Skin: Skin is warm and dry.    ED Course  Procedures (including critical care time) Labs Review Labs Reviewed  COMPREHENSIVE METABOLIC PANEL - Abnormal; Notable for the following:    Glucose, Bld 113 (*)    GFR calc non Af  Amer 84 (*)    All other components within normal limits  URINALYSIS, ROUTINE W REFLEX MICROSCOPIC - Abnormal; Notable for the following:  APPearance CLOUDY (*)    All other components within normal limits  CBC WITH DIFFERENTIAL  APTT  PROTIME-INR  TROPONIN I  PRO B NATRIURETIC PEPTIDE  CG4 I-STAT (LACTIC ACID)   Imaging Review Dg Abd Acute W/chest  11/28/2012   CLINICAL DATA:  Abdominal pain  EXAM: ACUTE ABDOMEN SERIES (ABDOMEN 2 VIEW & CHEST 1 VIEW)  COMPARISON:  07/23/2012  FINDINGS: There is no evidence of dilated bowel loops or free intraperitoneal air. There is a stone within the lower pole of the left kidney measuring 3 mm. No right renal calculi noted. Heart size and mediastinal contours are within normal limits. Both lungs are clear.  IMPRESSION: 1. No acute findings.  2. Left renal calculus.   Electronically Signed   By: Signa Kell M.D.   On: 11/28/2012 13:51    MDM  No diagnosis found.  Pt comes in with cc of abd pain.  DDx includes: Pancreatitis Hepatobiliary pathology including cholecystitis Gastritis/PUD SBO ACS syndrome Aortic Dissection Colitis AAA Tumors Colitis Intra abdominal abscess Thrombosis Mesenteric ischemia Diverticulitis Peritonitis Appendicitis Hernia Nephrolithiasis Pyelonephritis UTI/Cystitis  Pt comes in with cc of abd pain. Pt has hx of cancer, and has had persistent abd pain. She has had some BRBPR as well, Hb is stable. Pt has been advised to see a GI doctor, might be a cancerous lesion, so GI to see for possible colonoscopy.  Pt has some tenderness on exam. S/p TAH BSO, and appendectomy. AAS is negative. CT has been ordered due to persistent pain.  Pt had a CT chest ordered as well for the end of his month, after speaking with Dr. Gwenyth Bouillon, Oncology, CT chest has been also added, to prevent additional contrast load. Dr. Gwenyth Bouillon will f/u with the findings during clinic visit.  Signing out to Dr. Romeo Apple at this time. Labs  WNL, CT pending.         Derwood Kaplan, MD 11/28/12 1655

## 2012-11-28 NOTE — ED Notes (Addendum)
Pt c/o abdominal pain, hematuria and dysuria x 4 days and nausea x 1 day.  Pt was seen at [facility] for same complaint x 4 days ago and diagnosed with UTI.  Hx of lymphoma.    During MDs assessment, Pt c/o SOB with exertion.

## 2012-11-28 NOTE — ED Notes (Signed)
Left message for patient to return call.

## 2012-11-28 NOTE — ED Provider Notes (Signed)
4:48 PM Accepted care from Dr. Rhunette Croft. 85F here w/ 2nd visit for abd pain. Getting CT CAP as pt scheduled for CT chest later this month. Awaiting imaging. Plan on d/c home if neg.   7:29 PM: Notified pt of nodular opacity in LLL. No obvious cause of pt's pain. She continues to appear well.  I have discussed the diagnosis/risks/treatment options with the patient and family and believe the pt to be eligible for discharge home to follow-up with pcp. We also discussed returning to the ED immediately if new or worsening sx occur. We discussed the sx which are most concerning (e.g., worsening pain, fever) that necessitate immediate return. Any new prescriptions provided to the patient are listed below.  New Prescriptions   HYDROCODONE-ACETAMINOPHEN (NORCO/VICODIN) 5-325 MG PER TABLET    Take 1 tablet by mouth every 6 (six) hours as needed for pain.   ONDANSETRON (ZOFRAN ODT) 8 MG DISINTEGRATING TABLET    Take 1 tablet (8 mg total) by mouth every 8 (eight) hours as needed for nausea.   Clinical Impression 1. Abdominal pain   2. Non Hodgkin's lymphoma      Junius Argyle, MD 11/29/12 [id number]

## 2012-11-28 NOTE — ED Notes (Signed)
Patient transported to X-ray 

## 2012-11-30 ENCOUNTER — Telehealth: Payer: Self-pay | Admitting: Internal Medicine

## 2012-11-30 ENCOUNTER — Telehealth: Payer: Self-pay | Admitting: Medical Oncology

## 2012-11-30 NOTE — Telephone Encounter (Signed)
pt called to sched sooner appt with Dr. MM...ok per Dr. Kerry Fort staff msg.

## 2012-11-30 NOTE — Telephone Encounter (Signed)
I left voice message for Marie Holmes about her appt with Dr Arbutus Ped on 10/22. I called Rebecka Apley in radiology to cancel Ct appt on 10/30.

## 2012-12-02 ENCOUNTER — Telehealth (HOSPITAL_COMMUNITY): Payer: Self-pay | Admitting: Emergency Medicine

## 2012-12-02 NOTE — ED Notes (Signed)
Unable to contact patient via phone. Sent letter. °

## 2012-12-05 ENCOUNTER — Encounter (HOSPITAL_COMMUNITY): Payer: Self-pay | Admitting: Emergency Medicine

## 2012-12-05 ENCOUNTER — Emergency Department (HOSPITAL_COMMUNITY)
Admission: EM | Admit: 2012-12-05 | Discharge: 2012-12-05 | Disposition: A | Discharge status: Home / Self Care | Payer: Medicare Other | Attending: Emergency Medicine | Admitting: Emergency Medicine

## 2012-12-05 ENCOUNTER — Telehealth (HOSPITAL_COMMUNITY): Payer: Self-pay | Admitting: *Deleted

## 2012-12-05 DIAGNOSIS — Z85118 Personal history of other malignant neoplasm of bronchus and lung: Secondary | ICD-10-CM | POA: Insufficient documentation

## 2012-12-05 DIAGNOSIS — Z87442 Personal history of urinary calculi: Secondary | ICD-10-CM | POA: Insufficient documentation

## 2012-12-05 DIAGNOSIS — Z8781 Personal history of (healed) traumatic fracture: Secondary | ICD-10-CM | POA: Insufficient documentation

## 2012-12-05 DIAGNOSIS — Z87441 Personal history of nephrotic syndrome: Secondary | ICD-10-CM | POA: Insufficient documentation

## 2012-12-05 DIAGNOSIS — R6883 Chills (without fever): Secondary | ICD-10-CM | POA: Insufficient documentation

## 2012-12-05 DIAGNOSIS — Z7982 Long term (current) use of aspirin: Secondary | ICD-10-CM | POA: Insufficient documentation

## 2012-12-05 DIAGNOSIS — J449 Chronic obstructive pulmonary disease, unspecified: Secondary | ICD-10-CM | POA: Insufficient documentation

## 2012-12-05 DIAGNOSIS — Z79899 Other long term (current) drug therapy: Secondary | ICD-10-CM | POA: Insufficient documentation

## 2012-12-05 DIAGNOSIS — Z923 Personal history of irradiation: Secondary | ICD-10-CM | POA: Insufficient documentation

## 2012-12-05 DIAGNOSIS — F411 Generalized anxiety disorder: Secondary | ICD-10-CM | POA: Insufficient documentation

## 2012-12-05 DIAGNOSIS — Z860100 Personal history of colon polyps, unspecified: Secondary | ICD-10-CM | POA: Insufficient documentation

## 2012-12-05 DIAGNOSIS — I10 Essential (primary) hypertension: Secondary | ICD-10-CM | POA: Insufficient documentation

## 2012-12-05 DIAGNOSIS — M1990 Unspecified osteoarthritis, unspecified site: Secondary | ICD-10-CM | POA: Insufficient documentation

## 2012-12-05 DIAGNOSIS — N390 Urinary tract infection, site not specified: Secondary | ICD-10-CM | POA: Insufficient documentation

## 2012-12-05 DIAGNOSIS — J4489 Other specified chronic obstructive pulmonary disease: Secondary | ICD-10-CM | POA: Insufficient documentation

## 2012-12-05 DIAGNOSIS — Z872 Personal history of diseases of the skin and subcutaneous tissue: Secondary | ICD-10-CM | POA: Insufficient documentation

## 2012-12-05 DIAGNOSIS — E119 Type 2 diabetes mellitus without complications: Secondary | ICD-10-CM | POA: Insufficient documentation

## 2012-12-05 DIAGNOSIS — Z8742 Personal history of other diseases of the female genital tract: Secondary | ICD-10-CM | POA: Insufficient documentation

## 2012-12-05 DIAGNOSIS — F17200 Nicotine dependence, unspecified, uncomplicated: Secondary | ICD-10-CM | POA: Insufficient documentation

## 2012-12-05 DIAGNOSIS — Z87898 Personal history of other specified conditions: Secondary | ICD-10-CM | POA: Insufficient documentation

## 2012-12-05 DIAGNOSIS — R110 Nausea: Secondary | ICD-10-CM | POA: Insufficient documentation

## 2012-12-05 LAB — CBC WITH DIFFERENTIAL/PLATELET
Eosinophils Absolute: 0.2 10*3/uL (ref 0.0–0.7)
Eosinophils Relative: 1 % (ref 0–5)
HCT: 38.2 % (ref 36.0–46.0)
Lymphocytes Relative: 13 % (ref 12–46)
Lymphs Abs: 1.7 10*3/uL (ref 0.7–4.0)
MCH: 31.4 pg (ref 26.0–34.0)
MCHC: 34.6 g/dL (ref 30.0–36.0)
MCV: 90.7 fL (ref 78.0–100.0)
Monocytes Absolute: 1.1 10*3/uL — ABNORMAL HIGH (ref 0.1–1.0)
Platelets: 304 10*3/uL (ref 150–400)
RBC: 4.21 MIL/uL (ref 3.87–5.11)
WBC: 13.6 10*3/uL — ABNORMAL HIGH (ref 4.0–10.5)

## 2012-12-05 LAB — URINALYSIS, ROUTINE W REFLEX MICROSCOPIC
Bilirubin Urine: NEGATIVE
Glucose, UA: NEGATIVE mg/dL
Ketones, ur: NEGATIVE mg/dL
Protein, ur: 100 mg/dL — AB
Urobilinogen, UA: 1 mg/dL (ref 0.0–1.0)

## 2012-12-05 LAB — POCT I-STAT, CHEM 8
BUN: 14 mg/dL (ref 6–23)
Calcium, Ion: 1.26 mmol/L (ref 1.13–1.30)
Chloride: 102 mEq/L (ref 96–112)
Creatinine, Ser: 0.9 mg/dL (ref 0.50–1.10)
Glucose, Bld: 125 mg/dL — ABNORMAL HIGH (ref 70–99)
Hemoglobin: 13.3 g/dL (ref 12.0–15.0)
Sodium: 140 mEq/L (ref 135–145)
TCO2: 26 mmol/L (ref 0–100)

## 2012-12-05 LAB — URINE MICROSCOPIC-ADD ON

## 2012-12-05 MED ORDER — PROMETHAZINE HCL 25 MG PO TABS: 25.0000 mg | ORAL_TABLET | Freq: Four times a day (QID) | ORAL | Status: DC | PRN

## 2012-12-05 MED ORDER — PHENAZOPYRIDINE HCL 200 MG PO TABS
200.0000 mg | ORAL_TABLET | Freq: Three times a day (TID) | ORAL | Status: DC
  Administered 2012-12-05: 200 mg via ORAL
  Filled 2012-12-05: qty 1

## 2012-12-05 MED ORDER — HYDROCODONE-ACETAMINOPHEN 5-325 MG PO TABS
1.0000 | ORAL_TABLET | Freq: Once | ORAL | Status: AC
  Administered 2012-12-05: 1 via ORAL
  Filled 2012-12-05: qty 1

## 2012-12-05 MED ORDER — CIPROFLOXACIN HCL 500 MG PO TABS: 500.0000 mg | ORAL_TABLET | Freq: Two times a day (BID) | ORAL | Status: DC

## 2012-12-05 MED ORDER — CIPROFLOXACIN HCL 500 MG PO TABS
500.0000 mg | ORAL_TABLET | Freq: Once | ORAL | Status: AC
  Administered 2012-12-05: 500 mg via ORAL
  Filled 2012-12-05: qty 1

## 2012-12-05 MED ORDER — PHENAZOPYRIDINE HCL 200 MG PO TABS: 200.0000 mg | ORAL_TABLET | Freq: Three times a day (TID) | ORAL | Status: DC

## 2012-12-05 NOTE — ED Notes (Signed)
Pt returned call d/t receiving letter in mail.  Urine culture + for E Coli, pt sts not feeling better from 10/4 visit to [facility] hospital so she returned on 10/8 to [facility] hospital.  Pt sts still having urinary burning and will follow up with PCP tomorrow at scheduled appt for re-eval.

## 2012-12-05 NOTE — ED Notes (Signed)
Pt reports that she has been seen at [facility] and [facility] several times in the last 2 weeks; pt states that she began having lower abd pain and hematuria again this pm around 7pm; pt states that she was seen for the that last week at [facility]; pt states that she received a phone call today from [facility] stating that she has [facility] in her urine and needed additional treatment.

## 2012-12-05 NOTE — ED Provider Notes (Addendum)
CSN: [id number]     Arrival date & time 12/05/12  2127 History   First MD Initiated Contact with Patient 12/05/12 2157     Chief Complaint  Patient presents with  . Hematuria   (Consider location/radiation/quality/duration/timing/severity/associated sxs/prior Treatment) Patient is a 77 y.o. female presenting with hematuria. The history is provided by the patient.  Hematuria This is a recurrent problem. Episode onset: 14 days. The problem occurs constantly. The problem has been gradually worsening. Associated symptoms include abdominal pain. Associated symptoms comments: Low back pain and now having blood clots in her urine. Exacerbated by: urinating. Nothing relieves the symptoms. Treatments tried: had been taking macrobid when UA postiive at [facility] however found out today she was resistant to it. The treatment provided no relief.    Past Medical History  Diagnosis Date  . Benign tumor of breast 2004    left  . Colon polyp   . Broken ribs     history of several broken ribs on right and compression  fx  . Fibroma of skin     Multiple fibromas/lipomas on arms and legs B  . Pyelonephritis   . Anxiety   . COPD (chronic obstructive pulmonary disease)   . Hyperlipidemia   . Osteoarthritis   . History of CT scan of abdomen 05/23/2003    Mild fatty liver inf, tiny hypodensity in right lobe of liver  . Kidney stone 02/21/1981    Right/removed  . Pulmonary nodules   . Diabetes mellitus without complication   . Cancer     non-hodgkins b cell lymphoma  . 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 Hx of radiation therapy 04/11/12- 05/08/12    left chest wall area, 40 gray 20 fx  . Hypertension    Past Surgical History  Procedure Laterality Date  . Appendectomy  02/21/1961  . Exploratory laparotomy  02/21/1970    for ? adhesions  . Abdominal hysterectomy  02/21/1970    right ovary remaining  . Bone marrow biopsy  03/09/2012  . Lung core biopsy   02/06/2012   Family History  Problem Relation Age of Onset  . Parkinsonism Brother   . Stroke Father   . Cancer Sister    History  Substance Use Topics  . Smoking status: Current Every Day Smoker -- 0.50 packs/day for 30 years    Types: Cigarettes  . Smokeless tobacco: Never Used     Comment: 3/4 pack X 50 years  . Alcohol Use: No   OB History   Grav Para Term Preterm Abortions TAB SAB Ect Mult Living                 Review of Systems  Constitutional: Positive for chills. Negative for fever.  Gastrointestinal: Positive for nausea and abdominal pain.  Genitourinary: Positive for hematuria.  Musculoskeletal: Positive for back pain.  All other systems reviewed and are negative.    Allergies  Sulfonamide derivatives and Chantix  Home Medications   Current Outpatient Rx  Name  Route  Sig  Dispense  Refill  . albuterol (PROVENTIL HFA;VENTOLIN HFA) 108 (90 BASE) MCG/ACT inhaler   Inhalation   Inhale 2 puffs into the lungs every 4 (four) hours as needed for wheezing.   1 Inhaler   1   . ALPRAZolam (XANAX) 0.5 MG tablet   Oral   Take 1 tablet (0.5 mg total) by mouth 3 (three) times daily as needed for sleep or anxiety.  90 tablet   0   . aspirin EC 81 MG tablet   Oral   Take 81 mg by mouth every morning.         . citalopram (CELEXA) 40 MG tablet   Oral   Take 1 tablet (40 mg total) by mouth every evening.   30 tablet   6   . Fluticasone-Salmeterol (ADVAIR DISKUS) 250-50 MCG/DOSE AEPB   Inhalation   Inhale 1 puff into the lungs 2 (two) times daily.   1 each   3   . HYDROcodone-acetaminophen (NORCO/VICODIN) 5-325 MG per tablet   Oral   Take 1 tablet by mouth every 6 (six) hours as needed for pain.   15 tablet   0   . ibuprofen (ADVIL,MOTRIN) 200 MG tablet   Oral   Take 400 mg by mouth every 8 (eight) hours as needed for pain. For pain         . losartan (COZAAR) 50 MG tablet   Oral   Take 1 tablet (50 mg total) by mouth every morning.   30  tablet   6   . metFORMIN (GLUCOPHAGE) 1000 MG tablet   Oral   Take 1 tablet (1,000 mg total) by mouth 2 (two) times daily with a meal.   180 tablet   1   . metoprolol succinate (TOPROL-XL) 100 MG 24 hr tablet   Oral   Take 1 tablet (100 mg total) by mouth every morning.   30 tablet   6   . Multiple Vitamin (MULTIVITAMIN) tablet   Oral   Take 1 tablet by mouth daily at 12 noon.          . nitrofurantoin, macrocrystal-monohydrate, (MACROBID) 100 MG capsule   Oral   Take 100 mg by mouth 2 (two) times daily.         . ondansetron (ZOFRAN ODT) 8 MG disintegrating tablet   Oral   Take 1 tablet (8 mg total) by mouth every 8 (eight) hours as needed for nausea.   20 tablet   0   . phenazopyridine (PYRIDIUM) 200 MG tablet   Oral   Take 1 tablet (200 mg total) by mouth 3 (three) times daily.   6 tablet   0   . vitamin B-12 (CYANOCOBALAMIN) 500 MCG tablet   Oral   Take 500 mcg by mouth daily.          BP 154/65  Pulse 78  Temp(Src) 98.4 F (36.9 C) (Oral)  Resp 20  Ht 5\' 1"  (1.549 m)  Wt 118 lb (53.524 kg)  BMI 22.31 kg/m2  SpO2 95% Physical Exam  Nursing note and vitals reviewed. Constitutional: She is oriented to person, place, and time. She appears well-developed and well-nourished. No distress.  HENT:  Head: Normocephalic and atraumatic.  Mouth/Throat: Oropharynx is clear and moist.  Eyes: Conjunctivae and EOM are normal. Pupils are equal, round, and reactive to light.  Neck: Normal range of motion. Neck supple.  Cardiovascular: Normal rate, regular rhythm and intact distal pulses.   No murmur heard. Pulmonary/Chest: Effort normal and breath sounds normal. No respiratory distress. She has no wheezes. She has no rales.  Abdominal: Soft. She exhibits no distension. There is tenderness in the suprapubic area. There is no rebound, no guarding and no CVA tenderness.  Musculoskeletal: Normal range of motion. She exhibits no edema and no tenderness.  Neurological:  She is alert and oriented to person, place, and time.  Skin: Skin is warm and dry.  No rash noted. No erythema.  Psychiatric: She has a normal mood and affect. Her behavior is normal.    ED Course  Procedures (including critical care time) Labs Review Labs Reviewed  URINALYSIS, ROUTINE W REFLEX MICROSCOPIC - Abnormal; Notable for the following:    Color, Urine RED (*)    APPearance TURBID (*)    Hgb urine dipstick LARGE (*)    Protein, ur 100 (*)    Leukocytes, UA SMALL (*)    All other components within normal limits  CBC WITH DIFFERENTIAL - Abnormal; Notable for the following:    WBC 13.6 (*)    Neutrophils Relative % 78 (*)    Neutro Abs 10.6 (*)    Monocytes Absolute 1.1 (*)    All other components within normal limits  URINE MICROSCOPIC-ADD ON - Abnormal; Notable for the following:    Bacteria, UA FIELD OBSCURED BY WBC'S (*)    All other components within normal limits  POCT I-STAT, CHEM 8 - Abnormal; Notable for the following:    Glucose, Bld 125 (*)    All other components within normal limits  URINE CULTURE   Imaging Review No results found.  EKG Interpretation   None       MDM   1. UTI (lower urinary tract infection)     Patient presents here with symptoms consistent with urinary tract infection. She was diagnosed with 1 on 11/24/2012 was given Macrobid however it appears that the culture grew out Escherichia coli that was resistant to Macrobid patient returns today stating that she's having worsening dysuria, hematuria and intermittent nausea. She is afebrile here and hemodynamically stable and low suspicion for pyelonephritis. Lab results are consistent with a urinary tract infection but normal creatinine mild elevation of white blood cell count 13,000. Will start patient on Cipro and she was sensitive to this as well as prior radio, pain control and Phenergan for nausea and she was given Zofran has a prescription the last time she was here in her insurance will  pay for it. She also did not pick up her pain medication due to the controversy with the Zofran. She also has a followup appointment with her doctor tomorrow.    Gwyneth Sprout, MD 12/05/12 [id number]  Gwyneth Sprout, MD 12/05/12 2300

## 2012-12-05 NOTE — ED Notes (Signed)
Pt was seen at [facility] on the 4th, dx with ecoli, today she started having abd pain and burning when urinating, she's also urinating blood with clots

## 2012-12-06 ENCOUNTER — Ambulatory Visit: Payer: Medicare Other | Admitting: Family Medicine

## 2012-12-07 ENCOUNTER — Other Ambulatory Visit: Payer: Self-pay | Admitting: *Deleted

## 2012-12-07 LAB — URINE CULTURE: Colony Count: >=100000

## 2012-12-07 MED ORDER — ALPRAZOLAM 0.5 MG PO TABS: 0.5000 mg | ORAL_TABLET | Freq: Three times a day (TID) | ORAL | Status: DC | PRN

## 2012-12-07 NOTE — Telephone Encounter (Signed)
rx called into pharmacy

## 2012-12-07 NOTE — Telephone Encounter (Signed)
Last filled 10/17/12 

## 2012-12-08 ENCOUNTER — Telehealth (HOSPITAL_COMMUNITY): Payer: Self-pay | Admitting: Emergency Medicine

## 2012-12-09 ENCOUNTER — Telehealth (HOSPITAL_COMMUNITY): Payer: Self-pay | Admitting: Emergency Medicine

## 2012-12-09 NOTE — ED Notes (Signed)
Post ED Visit - Positive Culture Follow-up  Culture report reviewed by antimicrobial stewardship pharmacist: []  Wes Dulaney, Pharm.D., BCPS []  Celedonio Miyamoto, [address].D., BCPS []  Georgina Pillion, Pharm.D., BCPS []  [facility], [address].D., BCPS, AAHIVP []  Estella Husk, Pharm.D., BCPS, AAHIVP [x]  Nadara Mustard, [address].D., BCPS  Positive urine culture Treated with Cipro, organism sensitive to the same and no further patient follow-up is required at this time.  Kylie A Holland 12/09/2012, 9:41 AM

## 2012-12-12 ENCOUNTER — Ambulatory Visit (HOSPITAL_BASED_OUTPATIENT_CLINIC_OR_DEPARTMENT_OTHER): Payer: Medicare Other | Admitting: Physician Assistant

## 2012-12-12 VITALS — BP 124/62 | HR 75 | Temp 97.7°F | Resp 18 | Ht 61.0 in | Wt 119.8 lb

## 2012-12-12 DIAGNOSIS — C8590 Non-Hodgkin lymphoma, unspecified, unspecified site: Secondary | ICD-10-CM

## 2012-12-12 DIAGNOSIS — J449 Chronic obstructive pulmonary disease, unspecified: Secondary | ICD-10-CM

## 2012-12-12 DIAGNOSIS — D47Z9 Other specified neoplasms of uncertain behavior of lymphoid, hematopoietic and related tissue: Secondary | ICD-10-CM

## 2012-12-12 DIAGNOSIS — N390 Urinary tract infection, site not specified: Secondary | ICD-10-CM

## 2012-12-12 DIAGNOSIS — R5381 Other malaise: Secondary | ICD-10-CM

## 2012-12-12 DIAGNOSIS — I10 Essential (primary) hypertension: Secondary | ICD-10-CM

## 2012-12-12 NOTE — Progress Notes (Addendum)
[facility] [phone]   [phone]  SHARED VISIT PROGRESS NOTE  Ruthe Mannan, MD [address] 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 Roselind Messier completed on 05/08/2012.  CURRENT THERAPY: Observation  INTERVAL HISTORY: Marie Holmes 77 y.o. female returns to the clinic today for followup visit accompanied her daughter and granddaughter. She having no energy. She can tells me that her husband died in 2022-07-31. She also is being treated for a urinary tract infection. She had hematuria and flank pain but not denied any fever. She is 3 days remaining on a course of Cipro. She states that she feels better overall when taking the Cipro feels it has helped her respiratory status as well. She still has some burning with urination. She also reports some night sweats and has had some diarrhea off and on. She is a bit tearful today. She had a restaging CT scan of her chest, abdomen and pelvis and is here to discuss the results of that study. Her daughter and granddaughter are wondering whether she needs oxygen at home as she sometimes appears as though she's having difficulty breathing.  She denied having any significant chest pain, cough,  or hemoptysis. She has no significant weight loss.  The patient completed a course of definitive radiotherapy to the left lower lobe lung mass under the care of Dr. Roselind Messier and she tolerated it fairly well.  She has been on observation.   MEDICAL HISTORY: Past Medical History  Diagnosis Date  . Benign tumor of breast 31-Jul-2002    left  . Colon polyp   . Broken ribs     history of several broken ribs on right and compression  fx  . Fibroma of skin     Multiple fibromas/lipomas on arms and legs B  . Pyelonephritis   . Anxiety   . COPD (chronic obstructive pulmonary disease)    . Hyperlipidemia   . Osteoarthritis   . History of CT scan of abdomen 05/23/2003    Mild fatty liver inf, tiny hypodensity in right lobe of liver  . Kidney stone 02/21/1981    Right/removed  . Pulmonary nodules   . Diabetes mellitus without complication   . Cancer     non-hodgkins b cell lymphoma  . 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 Hx of radiation therapy 04/11/12- 05/08/12    left chest wall area, 40 gray 20 fx  . Hypertension     ALLERGIES:  is allergic to sulfonamide derivatives and chantix.  MEDICATIONS:  Current Outpatient Prescriptions  Medication Sig Dispense Refill  . albuterol (PROVENTIL HFA;VENTOLIN HFA) 108 (90 BASE) MCG/ACT inhaler Inhale 2 puffs into the lungs every 4 (four) hours as needed for wheezing.  1 Inhaler  1  . ALPRAZolam (XANAX) 0.5 MG tablet Take 1 tablet (0.5 mg total) by mouth 3 (three) times daily as needed for sleep or anxiety.  90 tablet  0  . aspirin EC 81 MG tablet Take 81 mg by mouth every morning.      . ciprofloxacin (CIPRO) 500 MG tablet Take 1 tablet (500 mg total) by mouth 2 (two) times daily.  20 tablet  0  . citalopram (CELEXA) 40 MG tablet Take 1 tablet (40 mg total) by mouth every evening.  30 tablet  6  . Fluticasone-Salmeterol (ADVAIR DISKUS) 250-50 MCG/DOSE AEPB Inhale 1 puff into the lungs 2 (two) times daily.  1 each  3  . ibuprofen (ADVIL,MOTRIN) 200 MG tablet Take 400 mg by mouth every 8 (eight) hours as needed for pain. For pain      . losartan (COZAAR) 50 MG tablet Take 1 tablet (50 mg total) by mouth every morning.  30 tablet  6  . metFORMIN (GLUCOPHAGE) 1000 MG tablet Take 1 tablet (1,000 mg total) by mouth 2 (two) times daily with a meal.  180 tablet  1  . metoprolol succinate (TOPROL-XL) 100 MG 24 hr tablet Take 1 tablet (100 mg total) by mouth every morning.  30 tablet  6  . phenazopyridine (PYRIDIUM) 200 MG tablet Take 1 tablet (200 mg total) by mouth 3 (three) times daily.  6  tablet  0  . promethazine (PHENERGAN) 25 MG tablet Take 1 tablet (25 mg total) by mouth every 6 (six) hours as needed for nausea.  30 tablet  0  . vitamin B-12 (CYANOCOBALAMIN) 500 MCG tablet Take 500 mcg by mouth daily.       No current facility-administered medications for this visit.    SURGICAL HISTORY:  Past Surgical History  Procedure Laterality Date  . Appendectomy  02/21/1961  . Exploratory laparotomy  02/21/1970    for ? adhesions  . Abdominal hysterectomy  02/21/1970    right ovary remaining  . Bone marrow biopsy  03/09/2012  . Lung core biopsy  02/06/2012    REVIEW OF SYSTEMS:  A comprehensive review of systems was negative.   PHYSICAL EXAMINATION: General appearance: alert, cooperative and no distress Head: Normocephalic, without obvious abnormality, atraumatic Neck: no adenopathy Lymph nodes: Cervical, supraclavicular, and axillary nodes normal. Resp: clear to auscultation bilaterally Cardio: regular rate and rhythm, S1, S2 normal, no murmur, click, rub or gallop GI: soft, non-tender; bowel sounds normal; no masses,  no organomegaly Extremities: extremities normal, atraumatic, no cyanosis or edema  ECOG PERFORMANCE STATUS: 0 - Asymptomatic  Blood pressure 124/62, pulse 75, temperature 97.7 F (36.5 C), temperature source Oral, resp. rate 18, height 5\' 1"  (1.549 m), weight 119 lb 12.8 oz (54.341 kg). O2 sat at rest 94%, O2 sat with exertion 95%  LABORATORY DATA: Lab Results  Component Value Date   WBC 13.6* 12/05/2012   HGB 13.3 12/05/2012   HCT 39.0 12/05/2012   MCV 90.7 12/05/2012   PLT 304 12/05/2012      Chemistry      Component Value Date/Time   NA 140 12/05/2012 2230   NA 141 06/19/2012 0931   K 4.0 12/05/2012 2230   K 4.1 06/19/2012 0931   CL 102 12/05/2012 2230   CL 103 06/19/2012 0931   CO2 28 11/28/2012 1330   CO2 28 06/19/2012 0931   BUN 14 12/05/2012 2230   BUN 16.5 06/19/2012 0931   CREATININE 0.90 12/05/2012 2230   CREATININE 0.8  06/19/2012 0931   CREATININE 0.73 01/16/2012 1633      Component Value Date/Time   CALCIUM 9.7 11/28/2012 1330   CALCIUM 9.8 06/19/2012 0931   ALKPHOS 76 11/28/2012 1330   ALKPHOS 79 06/19/2012 0931   AST 15 11/28/2012 1330   AST 11 06/19/2012 0931   ALT 10 11/28/2012 1330   ALT 7 06/19/2012 0931   BILITOT 0.5 11/28/2012 1330   BILITOT 0.62 06/19/2012 0931       RADIOGRAPHIC STUDIES:  Ct Chest W Contrast  11/28/2012   CLINICAL DATA:  Right abdominal pain and history of chest lesion  EXAM: CT CHEST, ABDOMEN, AND PELVIS WITH CONTRAST  TECHNIQUE: Multidetector CT imaging of the chest, abdomen and pelvis was performed following the standard protocol during bolus administration of intravenous contrast.  CONTRAST:  80mL OMNIPAQUE IOHEXOL 300 MG/ML  SOLN  COMPARISON:  None.  Most recent CT abdomen/ pelvis 07/30/2012; chest abdomen and pelvis 06/20/2012  FINDINGS: CT CHEST FINDINGS  Mediastinum: Stable subcentimeter hypoattenuating lesion in the right thyroid gland which is nonspecific by CT. Otherwise, unremarkable mediastinum. No adenopathy. Normal appearance of the thoracic esophagus.  Heart/Vascular: Conventional 3 vessel aortic arch. No aneurysmal dilatation or dissection. The heart is within normal limits for size. No pericardial effusion. Atherosclerotic calcifications are noted in the left anterior descending and right coronary arteries. Main pulmonary arteries are within normal limits for size. No large central PE.  Lungs/Pleura: No pleural effusion. Centrilobular emphysema. Stable 3 mm subpleural nodule in the periphery of the right upper lobe. A 4 mm ground-glass attenuation nodular opacity in the left base remains unchanged dating back to 2013. The stable appearance of a 4 mm residual nodular opacity in the periphery of the left lower lobe in the region of the previous biopsy proven lymphoid lesion. Just anterior to this abnormality, there is a new 6 mm ground-glass attenuation nodular opacity (series  4, image 34) which was not present on prior imaging. The lungs are otherwise clear for pulmonary edema or focal airspace consolidation.  Bones/Soft Tissues: No acute fracture or aggressive appearing lytic or blastic osseous lesion.  CT ABDOMEN AND PELVIS FINDINGS  Abdomen: Unremarkable CT appearance of the stomach, duodenum, spleen, adrenal glands and pancreas. Normal hepatic morphology and contour. No focal hepatic lesion. Gallbladder is unremarkable. No intra or extrahepatic biliary ductal dilatation.  Unremarkable appearance of the bilateral kidneys. No focal solid lesion or hydronephrosis. Stable punctate nonobstructing stone in the lower pole of the left kidney. Subcentimeter hypoattenuating lesions bilaterally are too small to characterize but unchanged in size, number and appearance compared to prior and therefore highly likely to represent benign cysts.  No evidence of obstruction or focal bowel wall thickening. The terminal ileum is unremarkable. Colonic diverticular disease without CT evidence of active inflammation.  Pelvis: Unremarkable bladder. Surgical changes of prior hysterectomy. Unremarkable ovaries. No free fluid or suspicious adenopathy.  Bones/Soft Tissues: No acute fracture or aggressive appearing lytic or blastic osseous lesion. Stable mild compression deformity of the superior endplate of T11.  Vascular: Atherosclerotic vascular disease without aneurysmal dilatation.  IMPRESSION: CT CHEST IMPRESSION  1. No acute cardiopulmonary process within the chest. 2. New 6 mm ground-glass attenuation nodular opacity in the periphery of the left lower lobe anterior to the previously treated lesion concerning for local recurrence versus a metachronous lesion. Recommend close attention on followup imaging. 3. Atherosclerosis including coronary artery disease.  CT ABDOMEN AND PELVIS IMPRESSION  1. No acute abnormality in the abdomen or pelvis to explain the patient's clinical symptoms. 2. Stable  nonobstructing left lower pole nephrolithiasis. 3. Additional history findings as above without significant interval change.   Electronically Signed   By: Malachy Moan M.D.   On: 11/28/2012 18:12   Ct Abdomen Pelvis W Contrast  11/28/2012   CLINICAL DATA:  Right abdominal pain and history of chest lesion  EXAM: CT CHEST, ABDOMEN, AND PELVIS WITH CONTRAST  TECHNIQUE: Multidetector CT imaging of the chest, abdomen and pelvis was performed following the standard protocol during bolus administration of intravenous contrast.  CONTRAST:  80mL OMNIPAQUE  IOHEXOL 300 MG/ML  SOLN  COMPARISON:  None.  Most recent CT abdomen/ pelvis 07/30/2012; chest abdomen and pelvis 06/20/2012  FINDINGS: CT CHEST FINDINGS  Mediastinum: Stable subcentimeter hypoattenuating lesion in the right thyroid gland which is nonspecific by CT. Otherwise, unremarkable mediastinum. No adenopathy. Normal appearance of the thoracic esophagus.  Heart/Vascular: Conventional 3 vessel aortic arch. No aneurysmal dilatation or dissection. The heart is within normal limits for size. No pericardial effusion. Atherosclerotic calcifications are noted in the left anterior descending and right coronary arteries. Main pulmonary arteries are within normal limits for size. No large central PE.  Lungs/Pleura: No pleural effusion. Centrilobular emphysema. Stable 3 mm subpleural nodule in the periphery of the right upper lobe. A 4 mm ground-glass attenuation nodular opacity in the left base remains unchanged dating back to 2013. The stable appearance of a 4 mm residual nodular opacity in the periphery of the left lower lobe in the region of the previous biopsy proven lymphoid lesion. Just anterior to this abnormality, there is a new 6 mm ground-glass attenuation nodular opacity (series 4, image 34) which was not present on prior imaging. The lungs are otherwise clear for pulmonary edema or focal airspace consolidation.  Bones/Soft Tissues: No acute fracture or  aggressive appearing lytic or blastic osseous lesion.  CT ABDOMEN AND PELVIS FINDINGS  Abdomen: Unremarkable CT appearance of the stomach, duodenum, spleen, adrenal glands and pancreas. Normal hepatic morphology and contour. No focal hepatic lesion. Gallbladder is unremarkable. No intra or extrahepatic biliary ductal dilatation.  Unremarkable appearance of the bilateral kidneys. No focal solid lesion or hydronephrosis. Stable punctate nonobstructing stone in the lower pole of the left kidney. Subcentimeter hypoattenuating lesions bilaterally are too small to characterize but unchanged in size, number and appearance compared to prior and therefore highly likely to represent benign cysts.  No evidence of obstruction or focal bowel wall thickening. The terminal ileum is unremarkable. Colonic diverticular disease without CT evidence of active inflammation.  Pelvis: Unremarkable bladder. Surgical changes of prior hysterectomy. Unremarkable ovaries. No free fluid or suspicious adenopathy.  Bones/Soft Tissues: No acute fracture or aggressive appearing lytic or blastic osseous lesion. Stable mild compression deformity of the superior endplate of T11.  Vascular: Atherosclerotic vascular disease without aneurysmal dilatation.  IMPRESSION: CT CHEST IMPRESSION  1. No acute cardiopulmonary process within the chest. 2. New 6 mm ground-glass attenuation nodular opacity in the periphery of the left lower lobe anterior to the previously treated lesion concerning for local recurrence versus a metachronous lesion. Recommend close attention on followup imaging. 3. Atherosclerosis including coronary artery disease.  CT ABDOMEN AND PELVIS IMPRESSION  1. No acute abnormality in the abdomen or pelvis to explain the patient's clinical symptoms. 2. Stable nonobstructing left lower pole nephrolithiasis. 3. Additional history findings as above without significant interval change.   Electronically Signed   By: Malachy Moan M.D.   On:  11/28/2012 18:12   Dg Abd Acute W/chest  11/28/2012   CLINICAL DATA:  Abdominal pain  EXAM: ACUTE ABDOMEN SERIES (ABDOMEN 2 VIEW & CHEST 1 VIEW)  COMPARISON:  07/23/2012  FINDINGS: There is no evidence of dilated bowel loops or free intraperitoneal air. There is a stone within the lower pole of the left kidney measuring 3 mm. No right renal calculi noted. Heart size and mediastinal contours are within normal limits. Both lungs are clear.  IMPRESSION: 1. No acute findings.  2. Left renal calculus.   Electronically Signed   By: Signa Kell M.D.   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 Roselind Messier. There is a new 6 mm groundglass attenuation nodular opacity which was not present on the prior imaging in the periphery of the left lower lobe anterior to the previously treated lesion which is concerning for local recurrence versus a metachronous lesion. The patient was discussed with and also seen by Dr. Arbutus Ped. He reviewed her CT scans results of with the patient and her family members. We will continue her on observation but will bring her back in 4 months with a CT of the chest with contrast to reevaluate her disease. The patient has no evidence for disease progression on his recent scan and she had much improvement in the left lower lobe lung mass after radiotherapy. 2) COPD: Continue current treatment with albuterol inhaler.  3) hypertension: Currently on Cozaar and Toprol-XL. 4) UTI/hematuria-patient is advised to complete her course of Cipro as prescribed. She's to followup with her primary care physician if she continues to have dysuria/hematuria. 5) fatigue/questionable situational depression-patient's symptoms may be coming from grief reaction related to her husband's death in 07-13-2022. She's currently on Celexa 40 mg by mouth daily and  also has Xanax which she takes at bedtime. Patient is encouraged to continue her current medications and follow up with her primary care physician for any medication adjustments.  Shaun Zuccaro E, PA-C   She was advised to call immediately if she has any concerning symptoms in the interval.  All questions were answered. The patient knows to call the clinic with any problems, questions or concerns. We can certainly see the patient much sooner if necessary.  ADDENDUM Hematology/Oncology Attending: I had a face to face encounter with the patient today. I recommended her care plan. The patient is a very pleasant 77 years old white female with pulmonary lymphoid neoplasm status post definitive radiotherapy to the left lower lobe lung mass and currently on observation. She came to the clinic today accompanied by her 2 daughters. The patient is feeling fine except for shortness breath and fatigue. Oxygen saturation in the clinic with exercise was over 94%. Her recent CT scan of the chest showed no significant evidence for disease progression but she has groundglass opacity in the left lower lobe anterior to the previously treated area that would require close monitoring. I would see the patient back for followup visit in 4 months with repeat CT scan of the chest for evaluation of her disease. She was advised to call immediately if she has any concerning symptoms in the interval. Lajuana Matte., MD 12/12/2012

## 2012-12-12 NOTE — Patient Instructions (Signed)
Follow up in 4 months with a repeat CT scan of your chest

## 2012-12-14 ENCOUNTER — Telehealth: Payer: Self-pay | Admitting: Internal Medicine

## 2012-12-14 NOTE — Telephone Encounter (Signed)
[facility] would mail calendar to pt w Feb 2015 appts as per 10/22 POF shh

## 2012-12-18 ENCOUNTER — Telehealth: Payer: Self-pay | Admitting: Internal Medicine

## 2012-12-18 NOTE — Telephone Encounter (Signed)
returned pt call and cx OCt and NOV appt per pt request due to pt seeing MD last week

## 2012-12-19 ENCOUNTER — Other Ambulatory Visit: Payer: Medicare Other

## 2012-12-19 ENCOUNTER — Other Ambulatory Visit (HOSPITAL_COMMUNITY): Payer: Medicare Other

## 2012-12-20 ENCOUNTER — Ambulatory Visit (HOSPITAL_COMMUNITY): Payer: Medicare Other

## 2012-12-24 ENCOUNTER — Ambulatory Visit (INDEPENDENT_AMBULATORY_CARE_PROVIDER_SITE_OTHER): Payer: Medicare Other | Admitting: Internal Medicine

## 2012-12-24 ENCOUNTER — Encounter: Payer: Self-pay | Admitting: Internal Medicine

## 2012-12-24 ENCOUNTER — Ambulatory Visit: Payer: Medicare Other | Admitting: Internal Medicine

## 2012-12-24 VITALS — BP 160/80 | HR 74 | Temp 97.8°F | Ht 61.0 in | Wt 118.8 lb

## 2012-12-24 DIAGNOSIS — E119 Type 2 diabetes mellitus without complications: Secondary | ICD-10-CM

## 2012-12-24 DIAGNOSIS — Z23 Encounter for immunization: Secondary | ICD-10-CM

## 2012-12-24 DIAGNOSIS — R300 Dysuria: Secondary | ICD-10-CM

## 2012-12-24 LAB — POCT URINALYSIS DIPSTICK
Blood, UA: NEGATIVE
Ketones, UA: NEGATIVE
Protein, UA: NEGATIVE
Spec Grav, UA: 1.01
pH, UA: 6

## 2012-12-24 LAB — HEMOGLOBIN A1C: Hgb A1c MFr Bld: 6.5 % (ref 4.6–6.5)

## 2012-12-24 MED ORDER — CANAGLIFLOZIN 100 MG PO TABS: 1.0000 | ORAL_TABLET | Freq: Every day | ORAL | Status: DC

## 2012-12-24 NOTE — Assessment & Plan Note (Signed)
Need to recheck A1C today Will change Metformin to Invokana (allergy to sulfa) Flu shot today Continue to work on your diet  RTC in 3 months to see if new medication is effective
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
## 2012-12-24 NOTE — Patient Instructions (Signed)
Type 2 Diabetes Mellitus, Adult Type 2 diabetes mellitus, often simply referred to as type 2 diabetes, is a long-lasting (chronic) disease. In type 2 diabetes, the pancreas does not make enough insulin (a hormone), the cells are less responsive to the insulin that is made (insulin resistance), or both. Normally, insulin moves sugars from food into the tissue cells. The tissue cells use the sugars for energy. The lack of insulin or the lack of normal response to insulin causes excess sugars to build up in the blood instead of going into the tissue cells. As a result, high blood sugar (hyperglycemia) develops. The effect of high sugar (glucose) levels can cause many complications. Type 2 diabetes was also previously called adult-onset diabetes but it can occur at any age.  RISK FACTORS  A person is predisposed to developing type 2 diabetes if someone in the family has the disease and also has one or more of the following primary risk factors:  Overweight.  An inactive lifestyle.  A history of consistently eating high-calorie foods. Maintaining a normal weight and regular physical activity can reduce the chance of developing type 2 diabetes. SYMPTOMS  A person with type 2 diabetes may not show symptoms initially. The symptoms of type 2 diabetes appear slowly. The symptoms include:  Increased thirst (polydipsia).  Increased urination (polyuria).  Increased urination during the night (nocturia).  Weight loss. This weight loss may be rapid.  Frequent, recurring infections.  Tiredness (fatigue).  Weakness.  Vision changes, such as blurred vision.  Fruity smell to your breath.  Abdominal pain.  Nausea or vomiting.  Cuts or bruises which are slow to heal.  Tingling or numbness in the hands or feet. DIAGNOSIS Type 2 diabetes is frequently not diagnosed until complications of diabetes are present. Type 2 diabetes is diagnosed when symptoms or complications are present and when blood  glucose levels are increased. Your blood glucose level may be checked by one or more of the following blood tests:  A fasting blood glucose test. You will not be allowed to eat for at least 8 hours before a blood sample is taken.  A random blood glucose test. Your blood glucose is checked at any time of the day regardless of when you ate.  A hemoglobin A1c blood glucose test. A hemoglobin A1c test provides information about blood glucose control over the previous 3 months.  An oral glucose tolerance test (OGTT). Your blood glucose is measured after you have not eaten (fasted) for 2 hours and then after you drink a glucose-containing beverage. TREATMENT   You may need to take insulin or diabetes medicine daily to keep blood glucose levels in the desired range.  You will need to match insulin dosing with exercise and healthy food choices. The treatment goal is to maintain the before meal blood sugar (preprandial glucose) level at 70 130 mg/dL. HOME CARE INSTRUCTIONS   Have your hemoglobin A1c level checked twice a year.  Perform daily blood glucose monitoring as directed by your caregiver.  Monitor urine ketones when you are ill and as directed by your caregiver.  Take your diabetes medicine or insulin as directed by your caregiver to maintain your blood glucose levels in the desired range.  Never run out of diabetes medicine or insulin. It is needed every day.  Adjust insulin based on your intake of carbohydrates. Carbohydrates can raise blood glucose levels but need to be included in your diet. Carbohydrates provide vitamins, minerals, and fiber which are an essential part of   a healthy diet. Carbohydrates are found in fruits, vegetables, whole grains, dairy products, legumes, and foods containing added sugars.    Eat healthy foods. Alternate 3 meals with 3 snacks.  Lose weight if overweight.  Carry a medical alert card or wear your medical alert jewelry.  Carry a 15 gram  carbohydrate snack with you at all times to treat low blood glucose (hypoglycemia). Some examples of 15 gram carbohydrate snacks include:  Glucose tablets, 3 or 4   Glucose gel, 15 gram tube  Raisins, 2 tablespoons (24 grams)  Jelly beans, 6  Animal crackers, 8  Regular pop, 4 ounces (120 mL)  Gummy treats, 9  Recognize hypoglycemia. Hypoglycemia occurs with blood glucose levels of 70 mg/dL and below. The risk for hypoglycemia increases when fasting or skipping meals, during or after intense exercise, and during sleep. Hypoglycemia symptoms can include:  Tremors or shakes.  Decreased ability to concentrate.  Sweating.  Increased heart rate.  Headache.  Dry mouth.  Hunger.  Irritability.  Anxiety.  Restless sleep.  Altered speech or coordination.  Confusion.  Treat hypoglycemia promptly. If you are alert and able to safely swallow, follow the 15:15 rule:  Take 15 20 grams of rapid-acting glucose or carbohydrate. Rapid-acting options include glucose gel, glucose tablets, or 4 ounces (120 mL) of fruit juice, regular soda, or low fat milk.  Check your blood glucose level 15 minutes after taking the glucose.  Take 15 20 grams more of glucose if the repeat blood glucose level is still 70 mg/dL or below.  Eat a meal or snack within 1 hour once blood glucose levels return to normal.    Be alert to polyuria and polydipsia which are early signs of hyperglycemia. An early awareness of hyperglycemia allows for prompt treatment. Treat hyperglycemia as directed by your caregiver.  Engage in at least 150 minutes of moderate-intensity physical activity a week, spread over at least 3 days of the week or as directed by your caregiver. In addition, you should engage in resistance exercise at least 2 times a week or as directed by your caregiver.  Adjust your medicine and food intake as needed if you start a new exercise or sport.  Follow your sick day plan at any time you  are unable to eat or drink as usual.  Avoid tobacco use.  Limit alcohol intake to no more than 1 drink per day for nonpregnant women and 2 drinks per day for men. You should drink alcohol only when you are also eating food. Talk with your caregiver whether alcohol is safe for you. Tell your caregiver if you drink alcohol several times a week.  Follow up with your caregiver regularly.  Schedule an eye exam soon after the diagnosis of type 2 diabetes and then annually.  Perform daily skin and foot care. Examine your skin and feet daily for cuts, bruises, redness, nail problems, bleeding, blisters, or sores. A foot exam by a caregiver should be done annually.  Brush your teeth and gums at least twice a day and floss at least once a day. Follow up with your dentist regularly.  Share your diabetes management plan with your workplace or school.  Stay up-to-date with immunizations.  Learn to manage stress.  Obtain ongoing diabetes education and support as needed.  Participate in, or seek rehabilitation as needed to maintain or improve independence and quality of life. Request a physical or occupational therapy referral if you are having foot or hand numbness or difficulties with grooming,   dressing, eating, or physical activity. SEEK MEDICAL CARE IF:   You are unable to eat food or drink fluids for more than 6 hours.  You have nausea and vomiting for more than 6 hours.  Your blood glucose level is over 240 mg/dL.  There is a change in mental status.  You develop an additional serious illness.  You have diarrhea for more than 6 hours.  You have been sick or have had a fever for a couple of days and are not getting better.  You have pain during any physical activity.  SEEK IMMEDIATE MEDICAL CARE IF:  You have difficulty breathing.  You have moderate to large ketone levels. MAKE SURE YOU:  Understand these instructions.  Will watch your condition.  Will get help right away if  you are not doing well or get worse. Document Released: 02/07/2005 Document Revised: 11/02/2011 Document Reviewed: 09/06/2011 [employer] Patient Information 2014 [employer].  

## 2012-12-26 ENCOUNTER — Encounter: Payer: Self-pay | Admitting: Family Medicine

## 2012-12-27 ENCOUNTER — Telehealth: Payer: Self-pay

## 2012-12-27 NOTE — Telephone Encounter (Signed)
Pt request 12/24/12 lab results; pt notified as instructed from 12/24/12 result note. Pt will call back if any further questions.

## 2013-01-03 ENCOUNTER — Other Ambulatory Visit: Payer: Self-pay | Admitting: Family Medicine

## 2013-01-03 MED ORDER — METOPROLOL SUCCINATE ER 100 MG PO TB24: 100.0000 mg | ORAL_TABLET | Freq: Every morning | ORAL | Status: DC

## 2013-01-03 MED ORDER — LOSARTAN POTASSIUM 50 MG PO TABS: 50.0000 mg | ORAL_TABLET | Freq: Every morning | ORAL | Status: DC

## 2013-01-08 ENCOUNTER — Other Ambulatory Visit: Payer: MEDICARE

## 2013-01-08 NOTE — Telephone Encounter (Signed)
Verified patient with two patient identifiers.  Spoke with anesthesiologist that was with her today for her carpel tunnel surgery who stated the pt was in aflutter today so the surgery was not performed.  Anesthesiologist wanted pt to be seen as soon as possible for this new rhythm.  Scheduled an appt.  Pt verbalized understanding.

## 2013-01-10 ENCOUNTER — Telehealth: Payer: Self-pay

## 2013-01-10 MED ORDER — GLIPIZIDE 5 MG PO TABS: 5.0000 mg | ORAL_TABLET | Freq: Two times a day (BID) | ORAL | Status: DC

## 2013-01-10 NOTE — Progress Notes (Signed)
Cardiac Electrophysiology Office Note     Subjective:      Amy Galloway is a 77 yo patient who is seen for abnormal ECG that was obtained at [facility] before her left left wrist surgery.  She said the anesthesiologist wanted to cancel because it showed atrial fibrillation.  The ECG appears to have regular RR intervals and the "fibrillation waves" appear to be possible artifacts to me.  She said she has had palpitations before and had seen me in the past for evaluation of PACs and PVCs when she was kindly referred by Dr Salvadore Farber. She reports she was diagnosised with PSVT 8 years ago, after having a stress test. She has been taking propanol for this for 8 years now and it was controlling the PSVT.  The past year and half she has been under a lot of stress, she had back surgery, her husband suffered a major heart attack and she recently fell and fractured her left wrist. During the episodes of fast heart rate she profusely sweats, she feels weak, lightheadedness and dizziness.    No associated SOB or chest pain.  She usually just lays down until the episode passes and blast the air conditioner. She did not think she had palpitation when this ECG was obtained   She has a past medical history of IBS, clotting disorder, hypertension, back surgery.   She had a cardiac cath at [facility] general 8 years, reported mild blockages that were not bad enough to stents.     Echo 10/03/12 (done at [facility] in [place])  LVEF 60% NO RWA.   Trivial MR and TR.   Holter (48 hour) 10/03/12: Avg HR 71 BPM- NSR. 2 PVCs and 21 PACs. NO sustained arrhythmias.     Family hx: mother CVA 70's/colan cancer, father hx prostate cancer, Oldest brother MI 57 yo. Sister CHF in 15yo. Brother youngest- cardiomyopathy, viral etiology . Paternal and maternal grandfather Mi in 105's.  No hx ppm   Social hx: Quit 2 years ago, tobacco use x 50 years. Very occasional. Married. 3 children. Active but does not  exercise, arthritis in both hips . 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         Patient Active Problem List    Diagnosis Date Noted   ??? PVC (premature ventricular contraction) 10/24/2012   ??? PAC (premature atrial contraction) 10/24/2012   ??? Palpitation 10/24/2012     Current Outpatient Prescriptions   Medication Sig Dispense Refill   ??? traMADol 50 mg TbDL Take  by mouth.       ??? pantoprazole (PROTONIX) 20 mg tablet Take 20 mg by mouth daily.       ??? losartan (COZAAR) 25 mg tablet Take  by mouth daily.       ??? B INFANTIS/B ANI/B LON/B BIFID (PROBIOTIC 4X PO) Take  by mouth.       ??? aspirin (ASPIRIN) 325 mg tablet Take 325 mg by mouth daily.       ??? levothyroxine (LEVOXYL) 150 mcg tablet Take 150 mcg by mouth Daily (before breakfast).         ??? propranolol (INDERAL) 20 mg tablet Take 20 mg by mouth three (3) times daily.         ??? LORazepam (ATIVAN) 0.5 mg tablet Take 0.5 mg by mouth two (2) times a day.         ??? citalopram (CELEXA) 20 mg tablet Take 20 mg by mouth daily.         ???  buPROPion SR (WELLBUTRIN SR) 150 mg SR tablet Take 150 mg by mouth two (2) times a day.         ??? ergocalciferol (VITAMIN D2) 50,000 unit capsule Take 50,000 Units by mouth. Twice a month        ??? fluticasone (FLONASE) 50 mcg/actuation nasal spray 2 Sprays by Both Nostrils route daily.         ??? DOCUSATE SODIUM (COLACE PO) Take 250 mg by mouth two (2) times a day.         ??? PYRIDOXINE HCL (VITAMIN B-6 PO) Take 500 mg by mouth daily.         ??? cyanocobalamin (VITAMIN B-12) 1,000 mcg tablet Take 1,000 mcg by mouth daily.         ??? CALCIUM CITRATE-VITAMIN D3 PO Take 2 Caps by mouth daily.         ??? iron polysaccharides (NU-IRON) 150 mg capsule Take 150 mg by mouth daily.         ??? Cholecalciferol, Vitamin D3, (VITAMIN D3) 1,000 unit Cap Take 1 Tab by mouth daily.         ??? VIT A/VIT C/VIT E/ZINC/COPPER (I-CAPS AREDS PO) Take 1 Tab by mouth daily.         ??? acetaminophen 650 mg Tab Take 1 Tab by mouth as needed.           No Known Allergies  Past  Medical History   Diagnosis Date   ??? Thyroid disease    ??? Psychiatric disorder      depression   ??? Arrhythmia      PSVT   ??? Hypertension    ??? Other ill-defined conditions      TMJ   ??? Other ill-defined conditions      vitamin D deficiency   ??? Other ill-defined conditions      insomnia   ??? Other ill-defined conditions      carpel tunnel syndrome   ??? Other ill-defined conditions      iron deficiency   ??? Cancer 1979     breast cancer/ chemotherapy (1980-81)   ??? Compression fx, lumbar spine    ??? Breast CA    ??? HX OTHER MEDICAL      chemotherapy     Past Surgical History   Procedure Laterality Date   ??? Hx dilation and curettage  1975     excessive bleeding   ??? Hx hysterectomy  1976     fibroid tumor causing excessive bleeding   ??? Hx gi  1978     hemorrhoidectomy and repair of anal ulcer   ??? Hx orthopaedic  1995     right carpel tunnel surgery   ??? Hx orthopaedic  2005     arthroscopic right shoulder surgery - torn rotator cuff   ??? Hx orthopaedic  2010, 2011,2012     spinal steroid injection L4, L5- spinal stenosis   ??? Pr cardiac surg procedure unlist  2007     heart cath due to rapid heart rate   ??? Hx gastric bypass  2000, 2001     initial/repair   ??? Hx heent       macular degeneration   ??? Hx heent  2006     laser surgery to repair left torn retina    ??? Hx other surgical       Surgery L4-L5   ??? Pr breast surgery procedure unlisted  1979     left modified radical  mastectomy - breast cancer   ??? Pr breast surgery procedure unlisted  1982     reconstruction       History   Substance Use Topics   ??? Smoking status: Former Smoker   ??? Smokeless tobacco: Not on file   ??? Alcohol Use: Yes      Comment: socially        Review of Systems:   Constitutional: Negative for fever, chills, weight loss  HEENT: Negative for nosebleeds, vision changes.   Respiratory: Negative for cough, hemoptysis, sputum production, and wheezing.   Cardiovascular: Negative for chest pain, +palpitations, but no orthopnea, claudication, leg swelling, syncope,  and PND.   Gastrointestinal: Negative for nausea, vomiting, diarrhea, constipation, blood in stool and melena.   Genitourinary: Negative for dysuria, and hematuria.   Musculoskeletal: Negative for myalgias, +arthralgia bilateral hips and back/wrist.   Skin: Negative for rash.   Heme: Does not bleed or bruise easily.   Neurological: Negative for speech change and focal weakness     Objective:     BP 118/70   Pulse 72   Ht 5\' 4"  (1.626 m)   Wt 183 lb (83.008 kg)   BMI 31.4 kg/m2   Physical Exam:   Constitutional: well-developed and well-nourished. No distress.   Head: Normocephalic and atraumatic.   Eyes: Pupils are equal, round  Neck: supple. No JVD present.   Cardiovascular: Normal rate, regular rhythm and normal heart sounds.  Exam reveals no gallop and no friction rub.  murmur heard.  Pulmonary/Chest: Effort normal and breath sounds normal. No wheezes.   Abdominal: Soft, no tenderness.   Musculoskeletal: no edema.   Neurological: alert,oriented.   Skin: Skin is warm and dry  Psychiatric: normal mood and affect. Behavior is normal. Judgment and thought content normal.      EKG: sinus rhythm with artifacts      Assessment/Plan:       ICD-9-CM    1. Abnormal ECG 794.31    2. Palpitation 785.1 traMADol 50 mg TbDL     AMB POC EKG ROUTINE W/ 12 LEADS, INTER & REP   3. PAC (premature atrial contraction) 427.61 traMADol 50 mg TbDL     AMB POC EKG ROUTINE W/ 12 LEADS, INTER & REP   4. PVC (premature ventricular contraction) 427.69 traMADol 50 mg TbDL     AMB POC EKG ROUTINE W/ 12 LEADS, INTER & REP     she will call if she decides to have Ep study and ablation done for possible PSVT in the future  We have not seen atrial fibrillation, her ECG appears to be artifacts to me so I repeated it in the office today after examination to confirm that she is in sinus rhythm.  She has left breast reconstructive surgery so when the ECG was obtained there was artifacts noted on the ECG first part but clearly in the second half there  was sinus P waves.  She may be at risk to have PAF due to age, hypertension, hypothyroid on synthroid but at this time I cannot confirm that she has AF and even if she does, this is not a contraindication for her wrist surgery.    If she does have PAF later I would recommend anticoagulant instead of aspirin  Follow-up Disposition:  Return in about 3 months (around 04/12/2013).    Thank you for involving me in this patient's care and please call with further concerns or questions.      Juliet Rude, M.D.  Electrophysiology/Cardiology  [facility]  [address] [place], [place] [address]                             [place] [address]  [phone]                                        [phone]

## 2013-01-10 NOTE — Addendum Note (Signed)
Addended by: Sueanne Margarita on: 01/10/2013 02:03 PM   Modules accepted: Orders, Medications

## 2013-01-10 NOTE — Telephone Encounter (Signed)
Ok to switch glipizide 5 mg BID, # 60 2 refills. Does she need treatment for the yeast infection?

## 2013-01-10 NOTE — Telephone Encounter (Signed)
Spoke with patient and advised results rx sent to pharmacy by e-script She does not need anything for the yeast, she will get OTC monistat

## 2013-01-10 NOTE — Telephone Encounter (Signed)
Pt was seen 12/24/12 and pt started Invokana; pt started vaginal itching after taking Invokana and pt just finished at UTI treatment. Pt said Invokana is too expensive; pt wants to stop Invokana and pt wants to glipizide 10 mg which is a tier 1 (more affordable) [facility] [place]. Pt request cb.

## 2013-02-07 ENCOUNTER — Encounter

## 2013-02-18 ENCOUNTER — Other Ambulatory Visit: Payer: Self-pay | Admitting: Family Medicine

## 2013-02-18 MED ORDER — CITALOPRAM HYDROBROMIDE 40 MG PO TABS: 40.0000 mg | ORAL_TABLET | Freq: Every evening | ORAL | Status: DC

## 2013-02-18 NOTE — Telephone Encounter (Signed)
Medication refill requested. pls advise

## 2013-02-27 ENCOUNTER — Ambulatory Visit (INDEPENDENT_AMBULATORY_CARE_PROVIDER_SITE_OTHER): Payer: Medicare HMO | Admitting: Family Medicine

## 2013-02-27 ENCOUNTER — Encounter: Payer: Self-pay | Admitting: Family Medicine

## 2013-02-27 VITALS — BP 130/62 | HR 76 | Temp 97.5°F | Ht 60.0 in | Wt 119.8 lb

## 2013-02-27 DIAGNOSIS — R350 Frequency of micturition: Secondary | ICD-10-CM

## 2013-02-27 DIAGNOSIS — E119 Type 2 diabetes mellitus without complications: Secondary | ICD-10-CM

## 2013-02-27 DIAGNOSIS — C8590 Non-Hodgkin lymphoma, unspecified, unspecified site: Secondary | ICD-10-CM

## 2013-02-27 DIAGNOSIS — F411 Generalized anxiety disorder: Secondary | ICD-10-CM

## 2013-02-27 DIAGNOSIS — R300 Dysuria: Secondary | ICD-10-CM

## 2013-02-27 DIAGNOSIS — C8589 Other specified types of non-Hodgkin lymphoma, extranodal and solid organ sites: Secondary | ICD-10-CM

## 2013-02-27 LAB — POCT URINALYSIS DIPSTICK
Bilirubin, UA: NEGATIVE
Blood, UA: NEGATIVE
Glucose, UA: NEGATIVE
Ketones, UA: NEGATIVE
Leukocytes, UA: NEGATIVE
Nitrite, UA: NEGATIVE
PH UA: 6
Protein, UA: NEGATIVE
Spec Grav, UA: <=1.005
UROBILINOGEN UA: 0.2

## 2013-02-27 MED ORDER — ALPRAZOLAM 0.5 MG PO TABS: 0.5000 mg | ORAL_TABLET | Freq: Three times a day (TID) | ORAL | Status: DC | PRN

## 2013-02-27 NOTE — Assessment & Plan Note (Signed)
Stable.  Good support system.  Uses prn xanax.

## 2013-02-27 NOTE — Progress Notes (Signed)
Subjective:    Patient ID: Marie Holmes, female    DOB: 06/02/1935, 78 y.o.   MRN: [id number]  HPI 78 yo very pleasant female with h/o COPD, DM, HTN, lymphoid CA here 3 month follow up.  Tough xmas- first xmas without her husband who died last year. Feels she has a good support system.  She remains active in her church. Feels anxiety controlled with prn xanax.  Tries not to take it too oftne.  Appetite has been good.  Weight stable.  Wt Readings from Last 3 Encounters:  02/27/13 119 lb 12 oz (54.318 kg)  12/24/12 118 lb 12 oz (53.865 kg)  12/12/12 119 lb 12.8 oz (54.341 kg)   Urinary frequency- had to go to ER for UTI in October.  Has had some increased frequency for months.  No recent dysuria.  DM stable- last a1c great.  Taking Glucotrol.  Stopped Metformin and diarrhea has resolved. Lab Results  Component Value Date   HGBA1C 6.5 12/24/2012   Non hodgkin's lymphoma- has follow up CT and blood work on 2/12 with Dr. Earlie Server. She has been having more sweats, fatigue. Patient Active Problem List   Diagnosis Date Noted  . Non Hodgkin's lymphoma 11/28/2012  . COLONIC POLYPS 09/24/2008  . DIABETES MELLITUS, TYPE II 08/31/2007  . HYPERLIPIDEMIA 05/11/2006  . ANXIETY 05/11/2006  . HYPERTENSION 05/11/2006  . VENOUS INSUFFICIENCY, CHRONIC 05/11/2006  . COPD 05/11/2006  . ANEMIA, IRON DEFICIENCY, UNSPEC. 04/20/2006  . GASTROESOPHAGEAL REFLUX, NO ESOPHAGITIS 04/20/2006  . IRRITABLE BOWEL SYNDROME 04/20/2006  . OSTEOARTHRITIS, MULTI SITES 04/20/2006  . Osteoporosis, unspecified 04/20/2006  . INSOMNIA NOS 04/20/2006  . INCONTINENCE, URGE 04/20/2006   Past Medical History  Diagnosis Date  . Benign tumor of breast 2004    left  . Colon polyp   . Broken ribs     history of several broken ribs on right and compression  fx  . Fibroma of skin     Multiple fibromas/lipomas on arms and legs B  . Pyelonephritis   . Anxiety   . COPD (chronic obstructive pulmonary disease)   .  Hyperlipidemia   . Osteoarthritis   . History of CT scan of abdomen 05/23/2003    Mild fatty liver inf, tiny hypodensity in right lobe of liver  . Kidney stone 02/21/1981    Right/removed  . Pulmonary nodules   . Diabetes mellitus without complication   . Cancer     non-hodgkins b cell lymphoma  . 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 Hx of radiation therapy 04/11/12- 05/08/12    left chest wall area, 40 gray 20 fx  . Hypertension    Past Surgical History  Procedure Laterality Date  . Appendectomy  02/21/1961  . Exploratory laparotomy  02/21/1970    for ? adhesions  . Abdominal hysterectomy  02/21/1970    right ovary remaining  . Bone marrow biopsy  03/09/2012  . Lung core biopsy  02/06/2012   History  Substance Use Topics  . Smoking status: Current Every Day Smoker -- 0.50 packs/day for 30 years    Types: Cigarettes  . Smokeless tobacco: Never Used     Comment: 3/4 pack X 50 years  . Alcohol Use: No   Family History  Problem Relation Age of Onset  . Parkinsonism Brother   . Stroke Father   . Cancer Sister    Allergies  Allergen Reactions  . Sulfonamide Derivatives Anaphylaxis  . Chantix [  Varenicline]     Made her angry, nausea, made blood pressure go up   Current Outpatient Prescriptions on File Prior to Visit  Medication Sig Dispense Refill  . albuterol (PROVENTIL HFA;VENTOLIN HFA) 108 (90 BASE) MCG/ACT inhaler Inhale 2 puffs into the lungs every 4 (four) hours as needed for wheezing.  1 Inhaler  1  . ALPRAZolam (XANAX) 0.5 MG tablet Take 1 tablet (0.5 mg total) by mouth 3 (three) times daily as needed for sleep or anxiety.  90 tablet  0  . aspirin EC 81 MG tablet Take 81 mg by mouth every morning.      . citalopram (CELEXA) 40 MG tablet Take 1 tablet (40 mg total) by mouth every evening.  30 tablet  6  . Fluticasone-Salmeterol (ADVAIR DISKUS) 250-50 MCG/DOSE AEPB Inhale 1 puff into the lungs 2 (two) times daily.  1  each  3  . glipiZIDE (GLUCOTROL) 5 MG tablet Take 1 tablet (5 mg total) by mouth 2 (two) times daily before a meal.  60 tablet  2  . ibuprofen (ADVIL,MOTRIN) 200 MG tablet Take 400 mg by mouth every 8 (eight) hours as needed for pain. For pain      . losartan (COZAAR) 50 MG tablet Take 1 tablet (50 mg total) by mouth every morning.  30 tablet  5  . metoprolol succinate (TOPROL-XL) 100 MG 24 hr tablet Take 1 tablet (100 mg total) by mouth every morning.  30 tablet  5  . vitamin B-12 (CYANOCOBALAMIN) 500 MCG tablet Take 500 mcg by mouth daily.       No current facility-administered medications on file prior to visit.   The PMH, PSH, Social History, Family History, Medications, and allergies have been reviewed in [facility], and have been updated if relevant.   Review of Systems  See HPI     Objective:   Physical Exam  BP 130/62  Pulse 76  Temp(Src) 97.5 F (36.4 C) (Oral)  Ht 5' (1.524 m)  Wt 119 lb 12 oz (54.318 kg)  BMI 23.39 kg/m2  SpO2 94%  Constitutional: Vital signs are normal. She appears well-developed and well-nourished. She is cooperative.  Non-toxic appearance. She does not appear ill. No distress.  Head: Normocephalic.  Cardiovascular: Normal rate, regular rhythm, S1 normal, S2 normal, normal heart sounds, intact distal pulses and normal pulses.  Exam reveals no gallop and no friction rub.   No murmur heard. Pulmonary/Chest: Effort normal and breath sounds normal. Not tachypneic.  Abdominal: Soft. Normal appearance and bowel sounds are normal. There is no tenderness.  Neurological: She is alert.  Skin: Skin is warm, dry and intact. No rash noted.  Psychiatric: Her speech is normal and behavior is normal. Judgment and thought content normal. Her mood appears not anxious. Cognition and memory are normal. She does not exhibit a depressed mood.      Assessment & Plan:.

## 2013-02-27 NOTE — Progress Notes (Signed)
Pre-visit discussion using our clinic review tool. No additional management support is needed unless otherwise documented below in the visit note.  

## 2013-02-27 NOTE — Assessment & Plan Note (Signed)
UA neg. Reassurance provided. Call or return to clinic prn if these symptoms worsen or fail to improve as anticipated. The patient indicates understanding of these issues and agrees with the plan.

## 2013-02-27 NOTE — Patient Instructions (Signed)
Great to see you. Your urine looked good- drink plenty of fluids.  I will look out for your results from Dr. Earlie Server.

## 2013-02-27 NOTE — Assessment & Plan Note (Signed)
Stable on current rx. No changes. 

## 2013-02-27 NOTE — Assessment & Plan Note (Signed)
Followed by Dr. Earlie Server. Has appt with him next month for follow up and follow up labs/CT.

## 2013-02-28 ENCOUNTER — Telehealth: Payer: Self-pay | Admitting: Family Medicine

## 2013-02-28 ENCOUNTER — Telehealth: Payer: Self-pay

## 2013-02-28 NOTE — Telephone Encounter (Signed)
Relevant patient education mailed to patient.  

## 2013-03-14 NOTE — Progress Notes (Signed)
Chart reviewed for Pre-Procedure evaluation and documentation.   -- Areas reviewed, BUT NOT LIMITED TO, are Patient's current and past H&P, Labs, all Notes, all Media records, radiology records, and medications.

## 2013-03-15 MED ORDER — FENTANYL CITRATE (PF) 50 MCG/ML IJ SOLN
50 mcg/mL | INTRAMUSCULAR | Status: DC | PRN
Start: 2013-03-15 — End: 2013-03-15
  Administered 2013-03-15 (×4): via INTRAVENOUS

## 2013-03-15 MED ORDER — LIDOCAINE HCL 1 % (10 MG/ML) IJ SOLN
10 mg/mL (1 %) | INTRAMUSCULAR | Status: DC | PRN
Start: 2013-03-15 — End: 2013-03-15
  Administered 2013-03-15: 15:00:00 via SUBCUTANEOUS

## 2013-03-15 MED ORDER — CEFAZOLIN 2 GRAM/50 ML NS IVPB
Freq: Once | INTRAVENOUS | Status: AC
Start: 2013-03-15 — End: 2013-03-15
  Administered 2013-03-15: 14:00:00 via INTRAVENOUS

## 2013-03-15 MED ORDER — MIDAZOLAM 1 MG/ML IJ SOLN
1 mg/mL | INTRAMUSCULAR | Status: DC | PRN
Start: 2013-03-15 — End: 2013-03-15
  Administered 2013-03-15 (×6): via INTRAVENOUS

## 2013-03-15 MED ORDER — IOHEXOL 180 MG/ML SOLN
180 mg iodine/mL | Freq: Once | INTRATHECAL | Status: AC
Start: 2013-03-15 — End: 2013-03-15
  Administered 2013-03-15: 15:00:00 via INTRATHECAL

## 2013-03-15 MED ADMIN — bupivacaine (PF) (MARCAINE) 0.75 % (7.5 mg/mL) injection 150 mg: EPIDURAL | @ 15:00:00 | NDC 00409116501

## 2013-03-15 MED ADMIN — 0.9% sodium chloride infusion: INTRAVENOUS | @ 14:00:00 | NDC 00409798303

## 2013-03-15 MED FILL — FENTANYL CITRATE (PF) 50 MCG/ML IJ SOLN: 50 mcg/mL | INTRAMUSCULAR | Qty: 2

## 2013-03-15 MED FILL — CEFAZOLIN 2 GRAM/50 ML NS IVPB: INTRAVENOUS | Qty: 50

## 2013-03-15 MED FILL — OMNIPAQUE 180 MG IODINE/ML INTRATHECAL SOLUTION: 180 mg iodine/mL | INTRATHECAL | Qty: 40

## 2013-03-15 MED FILL — SODIUM CHLORIDE 0.9 % IV: INTRAVENOUS | Qty: 1000

## 2013-03-15 MED FILL — LIDOCAINE HCL 1 % (10 MG/ML) IJ SOLN: 10 mg/mL (1 %) | INTRAMUSCULAR | Qty: 40

## 2013-03-15 MED FILL — BUPIVACAINE (PF) 0.75 % (7.5 MG/ML) IJ SOLN: 0.75 % (7.5 mg/mL) | INTRAMUSCULAR | Qty: 20

## 2013-03-15 MED FILL — MIDAZOLAM 1 MG/ML IJ SOLN: 1 mg/mL | INTRAMUSCULAR | Qty: 5

## 2013-03-15 NOTE — Procedures (Signed)
Radiology Post Procedure Note    03/15/2013    INFORMED CONSENT OBTAINED     Indications: L1 compression fracture, pain  L1 BONE BIOPSY  Procedure(s): L1 kyphoplasty      Specimens removed:  11 GAUGE CORE    Complications: None    Primary Physician: Christella NoaAmos Q Anzal Bartnick, MD    Recomendations: patient should be evaluated and treated for osteoporosis    Discharge Disposition: HOME    Full dictated report to follow    Signed By: Christella NoaAmos Q Patsy Zaragoza, MD

## 2013-03-15 NOTE — Other (Signed)
Chart accessed, H&P , allergies, and lab results reviewed for possible cath/angio procedure.

## 2013-03-15 NOTE — Procedures (Signed)
Radiology Post Procedure Note    03/15/2013    INFORMED CONSENT OBTAINED     Indications: L1 compression fracture, pain  L1 BONE BIOPSY  Procedure(s): L1 kyphoplasty      Specimens removed:  11 GAUGE CORE    Complications: None    Primary Physician: Jerral Mccauley Q Annelies Coyt, MD    Recomendations: patient should be evaluated and treated for osteoporosis    Discharge Disposition: HOME    Full dictated report to follow    Signed By: Ravleen Ries Q Arlynn Mcdermid, MD

## 2013-03-15 NOTE — Progress Notes (Addendum)
Received patient from waiting area. Armband and allergies verbally confirmed with patient.  Procedure explained and consents signed.    8:45 AM  Pt. Prepped and ready for procedure, cath lab staff aware. Husband at bedside.    8:53 AM  Dr. Magnus IvanHabib present, spoke to pt. And husband.    8:54 AM  TRANSFER - OUT REPORT:    Verbal report given to Angio Staff(name) on Rondel JumboBetty V Rago  being transferred to Angio Lab(unit) for routine progression of care       Report consisted of patient???s Situation, Background, Assessment and   Recommendations(SBAR).     Information from the following report(s) Kardex was reviewed with the receiving nurse.    Opportunity for questions and clarification was provided.      9:03 AM  Dr. Magnus IvanHabib aware of needing history and physical.    10:28 AM  TRANSFER - IN REPORT:    Verbal report received from [facility](name) on Rondel JumboBetty V Romain  being received from Angio Lab(unit) for routine progression of care      Report consisted of patient???s Situation, Background, Assessment and   Recommendations(SBAR).     Information from the following report(s) Procedure Summary was reviewed with the receiving nurse.    Opportunity for questions and clarification was provided.      Assessment completed upon patient???s arrival to unit and care assumed.     10:32 AM  Pt. In recovery, dressing x 2 to mid lower back dry and intact. HOB elevated 30 degrees. Pt. Denies any discomfort at this time in her back. Sipping juice and tolerating well. Pt. Pushed with both feet, equal strength and sensation in both legs.    10:36 AM  Pt. Resting quietly, no complaints. Husband updated on pt's recovery status.    11:12 AM  HOB elevated 45 degrees. Eating breakfast and tolerating well.    11:33 AM  Discharge instructions given to patient and husband.  All questions answered. Patient and husband verbalized understanding. Printed copy given to pt. And husband.    11:38 AM  Pt. Sat on edge of stretcher, ambulated with assistance. Denies any  discomfort or numbness in legs.    11:49 AM  Dr. Magnus IvanHabib present, spoke to pt. And husband, OK to discharge at this time.    11:58 AM  IV dc'd, dressing applied. Dressings x 2 to mid lower back remain dry and intact. Husband assisting pt. In getting dressed.

## 2013-03-15 NOTE — H&P (Signed)
Radiology History and Physical    Patient: Amy Galloway 78 y.o. female       Chief Complaint: No chief complaint on file.      History of Present Illness: L1 compression fracture    History:    Past Medical History   Diagnosis Date   ??? Thyroid disease    ??? Psychiatric disorder      depression   ??? Arrhythmia      PSVT   ??? Hypertension    ??? Other ill-defined conditions      TMJ   ??? Other ill-defined conditions      vitamin D deficiency   ??? Other ill-defined conditions      insomnia   ??? Other ill-defined conditions      carpel tunnel syndrome   ??? Other ill-defined conditions      iron deficiency   ??? Cancer 1979     breast cancer/ chemotherapy (1980-81)   ??? Compression fx, lumbar spine    ??? Breast CA    ??? HX OTHER MEDICAL      chemotherapy     No family history on file.  History     Social History   ??? Marital Status: MARRIED     Spouse Name: N/A     Number of Children: N/A   ??? Years of Education: N/A     Occupational History   ??? Not on file.     Social History Main Topics   ??? Smoking status: Former Smoker   ??? Smokeless tobacco: Not on file   ??? Alcohol Use: Yes      Comment: socially   ??? Drug Use: No   ??? Sexually Active: Not on file     Other Topics Concern   ??? Not on file     Social History Narrative   ??? No narrative on file       Allergies: No Known Allergies    Current Medications:  Current Facility-Administered Medications   Medication Dose Route Frequency   ??? fentaNYL citrate (PF) injection 25-200 mcg  25-200 mcg IntraVENous Multiple   ??? lidocaine (XYLOCAINE) 10 mg/mL (1 %) injection 10-30 mL  10-30 mL SubCUTAneous Multiple   ??? midazolam (VERSED) injection 0.5-10 mg  0.5-10 mg IntraVENous Multiple   ??? [COMPLETED] ceFAZolin in 0.9% NS (ANCEF) IVPB soln 2 g  2 g IntraVENous ONCE   ??? [COMPLETED] iohexol (OMNIPAQUE) 180 mg iodine/mL injection 40 mL  40 mL Intrathecal ONCE   ??? [COMPLETED] bupivacaine (PF) (MARCAINE) 0.75 % (7.5 mg/mL) injection 150 mg  20 mL Epidural ONCE   ??? 0.9% sodium chloride infusion  25 mL/hr  IntraVENous CONTINUOUS     Current Outpatient Prescriptions   Medication Sig   ??? traMADol 50 mg TbDL Take  by mouth.   ??? pantoprazole (PROTONIX) 20 mg tablet Take 20 mg by mouth daily.   ??? losartan (COZAAR) 25 mg tablet Take  by mouth daily.   ??? B INFANTIS/B ANI/B LON/B BIFID (PROBIOTIC 4X PO) Take  by mouth.   ??? aspirin (ASPIRIN) 325 mg tablet Take 325 mg by mouth daily.   ??? levothyroxine (LEVOXYL) 150 mcg tablet Take 150 mcg by mouth Daily (before breakfast).     ??? propranolol (INDERAL) 20 mg tablet Take 20 mg by mouth three (3) times daily.     ??? LORazepam (ATIVAN) 0.5 mg tablet Take 0.5 mg by mouth two (2) times a day.     ??? citalopram (CELEXA) 20  mg tablet Take 20 mg by mouth daily.     ??? buPROPion SR (WELLBUTRIN SR) 150 mg SR tablet Take 150 mg by mouth two (2) times a day.     ??? ergocalciferol (VITAMIN D2) 50,000 unit capsule Take 50,000 Units by mouth. Twice a month    ??? fluticasone (FLONASE) 50 mcg/actuation nasal spray 2 Sprays by Both Nostrils route daily.     ??? DOCUSATE SODIUM (COLACE PO) Take 250 mg by mouth two (2) times a day.     ??? PYRIDOXINE HCL (VITAMIN B-6 PO) Take 500 mg by mouth daily.     ??? cyanocobalamin (VITAMIN B-12) 1,000 mcg tablet Take 1,000 mcg by mouth daily.     ??? CALCIUM CITRATE-VITAMIN D3 PO Take 2 Caps by mouth daily.     ??? iron polysaccharides (NU-IRON) 150 mg capsule Take 150 mg by mouth daily.     ??? Cholecalciferol, Vitamin D3, (VITAMIN D3) 1,000 unit Cap Take 1 Tab by mouth daily.     ??? VIT A/VIT C/VIT E/ZINC/COPPER (I-CAPS AREDS PO) Take 1 Tab by mouth daily.     ??? acetaminophen 650 mg Tab Take 1 Tab by mouth as needed.          Physical Exam:  Blood pressure 117/62, pulse 93, temperature 98.3 ??F (36.8 ??C), resp. rate 18, height 5' 3.5" (1.613 m), weight 185 lb, SpO2 91.00%.  GENERAL: alert, cooperative, no distress, appears stated age  LUNG: clear to auscultation bilaterally  HEART: regular rate and rhythm, S1, S2 normal, no murmur, click, rub or gallop      Alerts:     Hospital Problems Date Reviewed: 01/10/2013    None          Laboratory:    No results found for this basename: HGB, HCT, WBC, PLT, PTT, PT, INR, BUN, CREA, K, CRCLT,  in the last 72 hours      Plan of Care/Planned Procedure:  Risks, benefits, and alternatives reviewed with patient and she agrees to proceed with the procedure.       Christella Noa, MD

## 2013-03-28 ENCOUNTER — Ambulatory Visit: Payer: Medicare Other | Admitting: Family Medicine

## 2013-04-04 ENCOUNTER — Other Ambulatory Visit: Payer: Medicare Other | Admitting: Lab

## 2013-04-04 ENCOUNTER — Ambulatory Visit (HOSPITAL_COMMUNITY)
Admission: RE | Admit: 2013-04-04 | Discharge: 2013-04-04 | Disposition: A | Discharge status: Home / Self Care | Payer: Medicare HMO | Source: Ambulatory Visit | Attending: Physician Assistant | Admitting: Physician Assistant

## 2013-04-04 ENCOUNTER — Other Ambulatory Visit (HOSPITAL_BASED_OUTPATIENT_CLINIC_OR_DEPARTMENT_OTHER): Payer: Medicare HMO

## 2013-04-04 DIAGNOSIS — C3490 Malignant neoplasm of unspecified part of unspecified bronchus or lung: Secondary | ICD-10-CM | POA: Insufficient documentation

## 2013-04-04 DIAGNOSIS — Z923 Personal history of irradiation: Secondary | ICD-10-CM | POA: Insufficient documentation

## 2013-04-04 DIAGNOSIS — R911 Solitary pulmonary nodule: Secondary | ICD-10-CM | POA: Insufficient documentation

## 2013-04-04 DIAGNOSIS — C8590 Non-Hodgkin lymphoma, unspecified, unspecified site: Secondary | ICD-10-CM

## 2013-04-04 DIAGNOSIS — R0602 Shortness of breath: Secondary | ICD-10-CM | POA: Insufficient documentation

## 2013-04-04 DIAGNOSIS — R05 Cough: Secondary | ICD-10-CM | POA: Insufficient documentation

## 2013-04-04 DIAGNOSIS — J438 Other emphysema: Secondary | ICD-10-CM | POA: Insufficient documentation

## 2013-04-04 DIAGNOSIS — I2510 Atherosclerotic heart disease of native coronary artery without angina pectoris: Secondary | ICD-10-CM | POA: Insufficient documentation

## 2013-04-04 DIAGNOSIS — J984 Other disorders of lung: Secondary | ICD-10-CM | POA: Insufficient documentation

## 2013-04-04 DIAGNOSIS — I700 Atherosclerosis of aorta: Secondary | ICD-10-CM | POA: Insufficient documentation

## 2013-04-04 DIAGNOSIS — M439 Deforming dorsopathy, unspecified: Secondary | ICD-10-CM | POA: Insufficient documentation

## 2013-04-04 DIAGNOSIS — R059 Cough, unspecified: Secondary | ICD-10-CM | POA: Insufficient documentation

## 2013-04-04 DIAGNOSIS — Z87898 Personal history of other specified conditions: Secondary | ICD-10-CM | POA: Insufficient documentation

## 2013-04-04 DIAGNOSIS — D47Z9 Other specified neoplasms of uncertain behavior of lymphoid, hematopoietic and related tissue: Secondary | ICD-10-CM

## 2013-04-04 LAB — CBC WITH DIFFERENTIAL/PLATELET
BASO%: 0.7 % (ref 0.0–2.0)
Basophils Absolute: 0 10*3/uL (ref 0.0–0.1)
EOS%: 2.7 % (ref 0.0–7.0)
Eosinophils Absolute: 0.2 10*3/uL (ref 0.0–0.5)
HCT: 41.8 % (ref 34.8–46.6)
HGB: 14.2 g/dL (ref 11.6–15.9)
LYMPH%: 20.2 % (ref 14.0–49.7)
MCH: 31.4 pg (ref 25.1–34.0)
MCHC: 34 g/dL (ref 31.5–36.0)
MCV: 92.4 fL (ref 79.5–101.0)
MONO#: 0.7 10*3/uL (ref 0.1–0.9)
MONO%: 9.3 % (ref 0.0–14.0)
NEUT#: 4.8 10*3/uL (ref 1.5–6.5)
NEUT%: 67.1 % (ref 38.4–76.8)
PLATELETS: 262 10*3/uL (ref 145–400)
RBC: 4.53 10*6/uL (ref 3.70–5.45)
RDW: 13.6 % (ref 11.2–14.5)
WBC: 7.1 10*3/uL (ref 3.9–10.3)
lymph#: 1.4 10*3/uL (ref 0.9–3.3)

## 2013-04-04 LAB — LACTATE DEHYDROGENASE (CC13): LDH: 141 U/L (ref 125–245)

## 2013-04-04 LAB — COMPREHENSIVE METABOLIC PANEL (CC13)
ALK PHOS: 90 U/L (ref 40–150)
ALT: 11 U/L (ref 0–55)
AST: 14 U/L (ref 5–34)
Albumin: 3.7 g/dL (ref 3.5–5.0)
Anion Gap: 9 mEq/L (ref 3–11)
BILIRUBIN TOTAL: 0.86 mg/dL (ref 0.20–1.20)
BUN: 12.5 mg/dL (ref 7.0–26.0)
CO2: 28 meq/L (ref 22–29)
CREATININE: 0.8 mg/dL (ref 0.6–1.1)
Calcium: 9.7 mg/dL (ref 8.4–10.4)
Chloride: 105 mEq/L (ref 98–109)
Glucose: 165 mg/dl — ABNORMAL HIGH (ref 70–140)
Potassium: 4.1 mEq/L (ref 3.5–5.1)
SODIUM: 142 meq/L (ref 136–145)
TOTAL PROTEIN: 6.5 g/dL (ref 6.4–8.3)

## 2013-04-04 MED ORDER — IOHEXOL 300 MG/ML  SOLN
80.0000 mL | Freq: Once | INTRAMUSCULAR | Status: AC | PRN
  Administered 2013-04-04: 80 mL via INTRAVENOUS

## 2013-04-08 ENCOUNTER — Other Ambulatory Visit: Payer: Medicare Other | Admitting: Lab

## 2013-04-08 ENCOUNTER — Ambulatory Visit (HOSPITAL_BASED_OUTPATIENT_CLINIC_OR_DEPARTMENT_OTHER): Payer: Commercial Managed Care - HMO | Admitting: Internal Medicine

## 2013-04-08 ENCOUNTER — Encounter (INDEPENDENT_AMBULATORY_CARE_PROVIDER_SITE_OTHER): Payer: Self-pay

## 2013-04-08 ENCOUNTER — Encounter: Payer: Self-pay | Admitting: Internal Medicine

## 2013-04-08 VITALS — BP 164/77 | HR 67 | Temp 97.5°F | Resp 17 | Ht 60.0 in | Wt 121.3 lb

## 2013-04-08 DIAGNOSIS — C8590 Non-Hodgkin lymphoma, unspecified, unspecified site: Secondary | ICD-10-CM

## 2013-04-08 DIAGNOSIS — I10 Essential (primary) hypertension: Secondary | ICD-10-CM

## 2013-04-08 DIAGNOSIS — C8589 Other specified types of non-Hodgkin lymphoma, extranodal and solid organ sites: Secondary | ICD-10-CM

## 2013-04-08 DIAGNOSIS — J449 Chronic obstructive pulmonary disease, unspecified: Secondary | ICD-10-CM

## 2013-04-08 DIAGNOSIS — J4489 Other specified chronic obstructive pulmonary disease: Secondary | ICD-10-CM

## 2013-04-08 NOTE — Progress Notes (Signed)
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 Sondra Come completed on 05/08/2012.  CURRENT THERAPY: Observation  INTERVAL HISTORY: Marie Holmes 78 y.o. female returns to the clinic today for followup visit. The patient is feeling fine today with no specific complaints. She denied having any significant chest pain, cough, shortness of breath or hemoptysis. She has no significant weight loss or night sweats. She has repeat CT scan of the chest for evaluation of her disease and she is here for discussion of her scan results and recommendation regarding her condition.  MEDICAL HISTORY: Past Medical History  Diagnosis Date  . Benign tumor of breast 2004    left  . Colon polyp   . Broken ribs     history of several broken ribs on right and compression  fx  . Fibroma of skin     Multiple fibromas/lipomas on arms and legs B  . Pyelonephritis   . Anxiety   . COPD (chronic obstructive pulmonary disease)   . Hyperlipidemia   . Osteoarthritis   . History of CT scan of abdomen 05/23/2003    Mild fatty liver inf, tiny hypodensity in right lobe of liver  . Kidney stone 02/21/1981    Right/removed  . Pulmonary nodules   . Diabetes mellitus without complication   . Cancer     non-hodgkins b cell lymphoma  . 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 Hx of radiation therapy 04/11/12- 05/08/12    left chest wall area, 40 gray 20 fx  . Hypertension     ALLERGIES:  is allergic to sulfonamide derivatives and chantix.  MEDICATIONS:  Current Outpatient Prescriptions  Medication Sig Dispense Refill  . albuterol (PROVENTIL HFA;VENTOLIN HFA) 108 (90 BASE) MCG/ACT inhaler  Inhale 2 puffs into the lungs every 4 (four) hours as needed for wheezing.  1 Inhaler  1  . ALPRAZolam (XANAX) 0.5 MG tablet Take 1 tablet (0.5 mg total) by mouth 3 (three) times daily as needed for sleep or anxiety.  90 tablet  0  . aspirin EC 81 MG tablet Take 81 mg by mouth every morning.      . citalopram (CELEXA) 40 MG tablet Take 1 tablet (40 mg total) by mouth every evening.  30 tablet  6  . Fluticasone-Salmeterol (ADVAIR DISKUS) 250-50 MCG/DOSE AEPB Inhale 1 puff into the lungs 2 (two) times daily.  1 each  3  . glipiZIDE (GLUCOTROL) 5 MG tablet Take 1 tablet (5 mg total) by mouth 2 (two) times daily before a meal.  60 tablet  2  . ibuprofen (ADVIL,MOTRIN) 200 MG tablet Take 400 mg by mouth every 8 (eight) hours as needed for pain. For pain      . losartan (COZAAR) 50 MG tablet Take 1 tablet (50 mg total) by mouth every morning.  30 tablet  5  . metoprolol succinate (TOPROL-XL) 100 MG 24 hr tablet Take 1 tablet (100 mg total) by mouth every morning.  30 tablet  5  . vitamin B-12 (CYANOCOBALAMIN) 500 MCG tablet Take 500 mcg by mouth daily.       No current facility-administered medications for this visit.    SURGICAL HISTORY:  Past  Surgical History  Procedure Laterality Date  . Appendectomy  02/21/1961  . Exploratory laparotomy  02/21/1970    for ? adhesions  . Abdominal hysterectomy  02/21/1970    right ovary remaining  . Bone marrow biopsy  03/09/2012  . Lung core biopsy  02/06/2012    REVIEW OF SYSTEMS:  A comprehensive review of systems was negative.   PHYSICAL EXAMINATION: General appearance: alert, cooperative and no distress Head: Normocephalic, without obvious abnormality, atraumatic Neck: no adenopathy Lymph nodes: Cervical, supraclavicular, and axillary nodes normal. Resp: clear to auscultation bilaterally Cardio: regular rate and rhythm, S1, S2 normal, no murmur, click, rub or gallop GI: soft, non-tender; bowel sounds normal; no masses,  no  organomegaly Extremities: extremities normal, atraumatic, no cyanosis or edema  ECOG PERFORMANCE STATUS: 0 - Asymptomatic  Blood pressure 164/77, pulse 67, temperature 97.5 F (36.4 C), temperature source Oral, resp. rate 17, height 5' (1.524 m), weight 121 lb 4.8 oz (55.021 kg), SpO2 95.00%.  LABORATORY DATA: Lab Results  Component Value Date   WBC 7.1 04/04/2013   HGB 14.2 04/04/2013   HCT 41.8 04/04/2013   MCV 92.4 04/04/2013   PLT 262 04/04/2013      Chemistry      Component Value Date/Time   NA 142 04/04/2013 0857   NA 140 12/05/2012 2230   K 4.1 04/04/2013 0857   K 4.0 12/05/2012 2230   CL 102 12/05/2012 2230   CL 103 06/19/2012 0931   CO2 28 04/04/2013 0857   CO2 28 11/28/2012 1330   BUN 12.5 04/04/2013 0857   BUN 14 12/05/2012 2230   CREATININE 0.8 04/04/2013 0857   CREATININE 0.90 12/05/2012 2230   CREATININE 0.73 01/16/2012 1633      Component Value Date/Time   CALCIUM 9.7 04/04/2013 0857   CALCIUM 9.7 11/28/2012 1330   ALKPHOS 90 04/04/2013 0857   ALKPHOS 76 11/28/2012 1330   AST 14 04/04/2013 0857   AST 15 11/28/2012 1330   ALT 11 04/04/2013 0857   ALT 10 11/28/2012 1330   BILITOT 0.86 04/04/2013 0857   BILITOT 0.5 11/28/2012 1330       RADIOGRAPHIC STUDIES: Ct Chest W Contrast  04/04/2013   CLINICAL DATA:  Follow up lung cancer diagnosed in 2014. History of non-Hodgkin's lymphoma. Patient reports coughing and shortness of breath after eating.  EXAM: CT CHEST WITH CONTRAST  TECHNIQUE: Multidetector CT imaging of the chest was performed during intravenous contrast administration.  CONTRAST:  45m OMNIPAQUE IOHEXOL 300 MG/ML  SOLN  COMPARISON:  CT CHEST W/CM dated 11/28/2012; DG ABD ACUTE W/CHEST dated 11/28/2012; CT CHEST W/CM dated 06/20/2012  FINDINGS: There are no enlarged mediastinal, hilar or axillary lymph nodes. Atherosclerosis of the aorta, great vessels and coronary arteries is again noted. No focal abnormality of the thyroid gland is demonstrated on the current  examination.  There is no pleural or pericardial effusion. Moderate emphysematous changes are again noted. The 3 mm subpleural right upper lobe nodule on image number 17 is stable. No residual focal nodularity is seen within the left lung. There is increased subpleural scarring or atelectasis inferolaterally in the left lower lobe and lingula which could be secondary to prior radiation therapy. There is no endobronchial lesion.  The visualized upper abdomen has a stable appearance. Mild superior endplate compression deformity at T11 is unchanged.    MPRESSION: 1. No suspicious findings or evidence of progressive metastatic disease. 2. There is progressive subpleural density inferolaterally in the left lower lobe and lingula which  could reflect postradiation change. No residual nodularity is apparent in this region. 3. Stable small subpleural right upper lobe nodule and emphysema. 4. No evidence of adenopathy.   Electronically Signed   By: Camie Patience M.D.   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 Sondra Come. The patient has no evidence for disease progression on his recent scan. I discussed the scan results with the patient. I recommended for her to continue on observation with repeat CT scan of the chest in 6 months. 2) COPD: Continue current treatment with albuterol inhaler. 3) hypertension: Currently on Cozaar and Toprol-XL.  She was advised to call immediately if she has any concerning symptoms in the interval.  All questions were answered. The patient knows to call the clinic with any problems, questions or concerns. We can certainly see the patient much sooner if necessary.

## 2013-04-08 NOTE — Patient Instructions (Signed)
Smoking Cessation, Tips for Success If you are ready to quit smoking, congratulations! You have chosen to help yourself be healthier. Cigarettes bring nicotine, tar, carbon monoxide, and other irritants into your body. Your lungs, heart, and blood vessels will be able to work better without these poisons. There are many different ways to quit smoking. Nicotine gum, nicotine patches, a nicotine inhaler, or nicotine nasal spray can help with physical craving. Hypnosis, support groups, and medicines help break the habit of smoking. WHAT THINGS CAN I DO TO MAKE QUITTING EASIER?  Here are some tips to help you quit for good:  Pick a date when you will quit smoking completely. Tell all of your friends and family about your plan to quit on that date.  Do not try to slowly cut down on the number of cigarettes you are smoking. Pick a quit date and quit smoking completely starting on that day.  Throw away all cigarettes.   Clean and remove all ashtrays from your home, work, and car.   On a card, write down your reasons for quitting. Carry the card with you and read it when you get the urge to smoke.   Cleanse your body of nicotine. Drink enough water and fluids to keep your urine clear or pale yellow. Do this after quitting to flush the nicotine from your body.   Learn to predict your moods. Do not let a bad situation be your excuse to have a cigarette. Some situations in your life might tempt you into wanting a cigarette.   Never have "just one" cigarette. It leads to wanting another and another. Remind yourself of your decision to quit.   Change habits associated with smoking. If you smoked while driving or when feeling stressed, try other activities to replace smoking. Stand up when drinking your coffee. Brush your teeth after eating. Sit in a different chair when you read the paper. Avoid alcohol while trying to quit, and try to drink fewer caffeinated beverages. Alcohol and caffeine may urge  you to smoke.   Avoid foods and drinks that can trigger a desire to smoke, such as sugary or spicy foods and alcohol.   Ask people who smoke not to smoke around you.   Have something planned to do right after eating or having a cup of coffee. For example, plan to take a walk or exercise.   Try a relaxation exercise to calm you down and decrease your stress. Remember, you may be tense and nervous for the first 2 weeks after you quit, but this will pass.   Find new activities to keep your hands busy. Play with a pen, coin, or rubber band. Doodle or draw things on paper.   Brush your teeth right after eating. This will help cut down on the craving for the taste of tobacco after meals. You can also try mouthwash.   Use oral substitutes in place of cigarettes. Try using lemon drops, carrots, cinnamon sticks, or chewing gum. Keep them handy so they are available when you have the urge to smoke.   When you have the urge to smoke, try deep breathing.   Designate your home as a nonsmoking area.   If you are a heavy smoker, ask your health care provider about a prescription for nicotine chewing gum. It can ease your withdrawal from nicotine.   Reward yourself. Set aside the cigarette money you save and buy yourself something nice.   Look for support from others. Join a support group or   smoking cessation program. Ask someone at home or at work to help you with your plan to quit smoking.   Always ask yourself, "Do I need this cigarette or is this just a reflex?" Tell yourself, "Today, I choose not to smoke," or "I do not want to smoke." You are reminding yourself of your decision to quit.  Do not replace cigarette smoking with electronic cigarettes (commonly called e-cigarettes). The safety of e-cigarettes is unknown, and some may contain harmful chemicals.  If you relapse, do not give up! Plan ahead and think about what you will do the next time you get the urge to smoke.  HOW WILL  I FEEL WHEN I QUIT SMOKING? You may have symptoms of withdrawal because your body is used to nicotine (the addictive substance in cigarettes). You may crave cigarettes, be irritable, feel very hungry, cough often, get headaches, or have difficulty concentrating. The withdrawal symptoms are only temporary. They are strongest when you first quit but will go away within 10 14 days. When withdrawal symptoms occur, stay in control. Think about your reasons for quitting. Remind yourself that these are signs that your body is healing and getting used to being without cigarettes. Remember that withdrawal symptoms are easier to treat than the major diseases that smoking can cause.  Even after the withdrawal is over, expect periodic urges to smoke. However, these cravings are generally short lived and will go away whether you smoke or not. Do not smoke!  WHAT RESOURCES ARE AVAILABLE TO HELP ME QUIT SMOKING? Your health care provider can direct you to community resources or hospitals for support, which may include:  Group support.  Education.  Hypnosis.  Therapy. Document Released: 11/06/2003 Document Revised: 11/28/2012 Document Reviewed: 07/26/2012 [employer] Patient Information 2014 [employer].  

## 2013-04-10 ENCOUNTER — Telehealth: Payer: Self-pay | Admitting: Internal Medicine

## 2013-04-10 NOTE — Telephone Encounter (Signed)
s.w. pt and advised on Aug appts....pt ok and aware

## 2013-04-11 ENCOUNTER — Ambulatory Visit (INDEPENDENT_AMBULATORY_CARE_PROVIDER_SITE_OTHER)
Admission: RE | Admit: 2013-04-11 | Discharge: 2013-04-11 | Disposition: A | Discharge status: Home / Self Care | Payer: Medicare HMO | Source: Ambulatory Visit | Attending: Family Medicine | Admitting: Family Medicine

## 2013-04-11 ENCOUNTER — Ambulatory Visit (INDEPENDENT_AMBULATORY_CARE_PROVIDER_SITE_OTHER): Payer: Commercial Managed Care - HMO | Admitting: Family Medicine

## 2013-04-11 ENCOUNTER — Encounter: Payer: Self-pay | Admitting: Family Medicine

## 2013-04-11 VITALS — BP 136/74 | HR 69 | Temp 97.3°F | Wt 125.8 lb

## 2013-04-11 DIAGNOSIS — Z136 Encounter for screening for cardiovascular disorders: Secondary | ICD-10-CM

## 2013-04-11 DIAGNOSIS — R8290 Unspecified abnormal findings in urine: Secondary | ICD-10-CM | POA: Insufficient documentation

## 2013-04-11 DIAGNOSIS — C8589 Other specified types of non-Hodgkin lymphoma, extranodal and solid organ sites: Secondary | ICD-10-CM

## 2013-04-11 DIAGNOSIS — E119 Type 2 diabetes mellitus without complications: Secondary | ICD-10-CM | POA: Diagnosis not present

## 2013-04-11 DIAGNOSIS — M549 Dorsalgia, unspecified: Secondary | ICD-10-CM

## 2013-04-11 DIAGNOSIS — C8590 Non-Hodgkin lymphoma, unspecified, unspecified site: Secondary | ICD-10-CM

## 2013-04-11 DIAGNOSIS — R82998 Other abnormal findings in urine: Secondary | ICD-10-CM

## 2013-04-11 LAB — LIPID PANEL
CHOLESTEROL: 224 mg/dL — AB (ref 0–200)
HDL: 53.2 mg/dL (ref >39.00)
TRIGLYCERIDES: 164 mg/dL — AB (ref 0.0–149.0)
Total CHOL/HDL Ratio: 4
VLDL: 32.8 mg/dL (ref 0.0–40.0)

## 2013-04-11 LAB — POCT URINALYSIS DIPSTICK
Bilirubin, UA: NEGATIVE
Blood, UA: NEGATIVE
GLUCOSE UA: NEGATIVE
KETONES UA: NEGATIVE
Leukocytes, UA: NEGATIVE
Nitrite, UA: NEGATIVE
Protein, UA: NEGATIVE
Spec Grav, UA: <=1.005
UROBILINOGEN UA: 0.2
pH, UA: 6.5

## 2013-04-11 LAB — HEMOGLOBIN A1C: Hgb A1c MFr Bld: 6.9 % — ABNORMAL HIGH (ref 4.6–6.5)

## 2013-04-11 MED ORDER — ALBUTEROL SULFATE HFA 108 (90 BASE) MCG/ACT IN AERS: 2.0000 | INHALATION_SPRAY | RESPIRATORY_TRACT | Status: DC | PRN

## 2013-04-11 NOTE — Patient Instructions (Signed)
Good to see you. I will call you with your xray results.

## 2013-04-11 NOTE — Assessment & Plan Note (Signed)
UA neg. Reassurance provided that this is not infectious and unlikely related to kidney stones. Will check a1c to assess diabetes control. Orders Placed This Encounter  Procedures  . DG Lumbar Spine Complete  . Lipid panel  . Hemoglobin A1c  . Urinalysis Dipstick

## 2013-04-11 NOTE — Progress Notes (Signed)
Subjective:    Patient ID: Marie Holmes, female    DOB: 1935/09/01, 78 y.o.   MRN: [id number]  HPI 78 yo very pleasant female with h/o COPD, DM, HTN, lymphoid CA here for progressive back pain and foul odor to urine.  Foul odor to urine- had to go to ER for UTI in October.  Has had some increased frequency as well for months.  No recent dysuria.  Also having bilateral, symmetrical low back pain for months. H/o kidney stones- no blood in her urine and nausea- does not feel like her typical kidney stone pain.   Last saw urology in 08/2012.  Lab Results  Component Value Date   HGBA1C 6.5 12/24/2012    CT for restaging of CA last week was good- no signs of progressive mets, some possible post radiation scarring.   Ct Chest W Contrast  04/04/2013   CLINICAL DATA:  Follow up lung cancer diagnosed in 2014. History of non-Hodgkin's lymphoma. Patient reports coughing and shortness of breath after eating.  EXAM: CT CHEST WITH CONTRAST  TECHNIQUE: Multidetector CT imaging of the chest was performed during intravenous contrast administration.  CONTRAST:  83m OMNIPAQUE IOHEXOL 300 MG/ML  SOLN  COMPARISON:  CT CHEST W/CM dated 11/28/2012; DG ABD ACUTE W/CHEST dated 11/28/2012; CT CHEST W/CM dated 06/20/2012  FINDINGS: There are no enlarged mediastinal, hilar or axillary lymph nodes. Atherosclerosis of the aorta, great vessels and coronary arteries is again noted. No focal abnormality of the thyroid gland is demonstrated on the current examination.  There is no pleural or pericardial effusion. Moderate emphysematous changes are again noted. The 3 mm subpleural right upper lobe nodule on image number 17 is stable. No residual focal nodularity is seen within the left lung. There is increased subpleural scarring or atelectasis inferolaterally in the left lower lobe and lingula which could be secondary to prior radiation therapy. There is no endobronchial lesion.  The visualized upper abdomen has a stable  appearance. Mild superior endplate compression deformity at T11 is unchanged.  IMPRESSION: 1. No suspicious findings or evidence of progressive metastatic disease. 2. There is progressive subpleural density inferolaterally in the left lower lobe and lingula which could reflect postradiation change. No residual nodularity is apparent in this region. 3. Stable small subpleural right upper lobe nodule and emphysema. 4. No evidence of adenopathy.   Electronically Signed   By: BCamie PatienceM.D.   On: 04/04/2013 11:18   Patient Active Problem List   Diagnosis Date Noted  . Back pain 04/11/2013  . Abnormal urine odor 04/11/2013  . Urinary frequency 02/27/2013  . Non Hodgkin's lymphoma 11/28/2012  . COLONIC POLYPS 09/24/2008  . DIABETES MELLITUS, TYPE II 08/31/2007  . HYPERLIPIDEMIA 05/11/2006  . ANXIETY 05/11/2006  . HYPERTENSION 05/11/2006  . VENOUS INSUFFICIENCY, CHRONIC 05/11/2006  . COPD 05/11/2006  . ANEMIA, IRON DEFICIENCY, UNSPEC. 04/20/2006  . GASTROESOPHAGEAL REFLUX, NO ESOPHAGITIS 04/20/2006  . IRRITABLE BOWEL SYNDROME 04/20/2006  . OSTEOARTHRITIS, MULTI SITES 04/20/2006  . Osteoporosis, unspecified 04/20/2006  . INSOMNIA NOS 04/20/2006  . INCONTINENCE, URGE 04/20/2006   Past Medical History  Diagnosis Date  . Benign tumor of breast 2004    left  . Colon polyp   . Broken ribs     history of several broken ribs on right and compression  fx  . Fibroma of skin     Multiple fibromas/lipomas on arms and legs B  . Pyelonephritis   . Anxiety   . COPD (chronic obstructive pulmonary disease)   .  Hyperlipidemia   . Osteoarthritis   . History of CT scan of abdomen 05/23/2003    Mild fatty liver inf, tiny hypodensity in right lobe of liver  . Kidney stone 02/21/1981    Right/removed  . Pulmonary nodules   . Diabetes mellitus without complication   . Cancer     non-hodgkins b cell lymphoma  . 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 Hx  of radiation therapy 04/11/12- 05/08/12    left chest wall area, 40 gray 20 fx  . Hypertension    Past Surgical History  Procedure Laterality Date  . Appendectomy  02/21/1961  . Exploratory laparotomy  02/21/1970    for ? adhesions  . Abdominal hysterectomy  02/21/1970    right ovary remaining  . Bone marrow biopsy  03/09/2012  . Lung core biopsy  02/06/2012   History  Substance Use Topics  . Smoking status: Current Every Day Smoker -- 0.50 packs/day for 30 years    Types: Cigarettes  . Smokeless tobacco: Never Used     Comment: 3/4 pack X 50 years  . Alcohol Use: No   Family History  Problem Relation Age of Onset  . Parkinsonism Brother   . Stroke Father   . Cancer Sister    Allergies  Allergen Reactions  . Sulfonamide Derivatives Anaphylaxis  . Chantix [Varenicline]     Made her angry, nausea, made blood pressure go up   Current Outpatient Prescriptions on File Prior to Visit  Medication Sig Dispense Refill  . ALPRAZolam (XANAX) 0.5 MG tablet Take 1 tablet (0.5 mg total) by mouth 3 (three) times daily as needed for sleep or anxiety.  90 tablet  0  . aspirin EC 81 MG tablet Take 81 mg by mouth every morning.      . citalopram (CELEXA) 40 MG tablet Take 1 tablet (40 mg total) by mouth every evening.  30 tablet  6  . glipiZIDE (GLUCOTROL) 5 MG tablet Take 1 tablet (5 mg total) by mouth 2 (two) times daily before a meal.  60 tablet  2  . ibuprofen (ADVIL,MOTRIN) 200 MG tablet Take 400 mg by mouth every 8 (eight) hours as needed for pain. For pain      . losartan (COZAAR) 50 MG tablet Take 1 tablet (50 mg total) by mouth every morning.  30 tablet  5  . metoprolol succinate (TOPROL-XL) 100 MG 24 hr tablet Take 1 tablet (100 mg total) by mouth every morning.  30 tablet  5  . vitamin B-12 (CYANOCOBALAMIN) 500 MCG tablet Take 500 mcg by mouth daily.       No current facility-administered medications on file prior to visit.   The PMH, PSH, Social History, Family History,  Medications, and allergies have been reviewed in [facility], and have been updated if relevant.   Review of Systems  See HPI    No fevers No night sweats No chills Objective:   Physical Exam  BP 136/74  Pulse 69  Temp(Src) 97.3 F (36.3 C) (Oral)  Wt 125 lb 12 oz (57.04 kg)  SpO2 96%  Constitutional: Vital signs are normal. She appears well-developed and well-nourished. She is cooperative.  Non-toxic appearance. She does not appear ill. No distress.  Head: Normocephalic.  Cardiovascular: Normal rate, regular rhythm, S1 normal, S2 normal, normal heart sounds, intact distal pulses and normal pulses.  Exam reveals no gallop and no friction rub.   No murmur heard. Pulmonary/Chest: Effort normal  and breath sounds normal. Not tachypneic.  Abdominal: Soft. Normal appearance and bowel sounds are normal. There is no tenderness.  Neurological: She is alert.  Skin: Skin is warm, dry and intact. No rash noted.  Psychiatric: Her speech is normal and behavior is normal. Judgment and thought content normal. Her mood appears not anxious. Cognition and memory are normal. She does not exhibit a depressed mood.      MSK:  No TTP over lumbar spine- some tight paraspinous muscles SLR and gait normal Assessment & Plan:.

## 2013-04-11 NOTE — Assessment & Plan Note (Signed)
Followed by Dr. Julien Nordmann.  Restaging CT this month reassuring.

## 2013-04-11 NOTE — Assessment & Plan Note (Signed)
?  MSK- lumbar xray today for further evaluation.

## 2013-04-11 NOTE — Progress Notes (Signed)
Pre-visit discussion using our clinic review tool. No additional management support is needed unless otherwise documented below in the visit note.  

## 2013-04-12 ENCOUNTER — Encounter: Payer: Self-pay | Admitting: *Deleted

## 2013-04-12 LAB — LDL CHOLESTEROL, DIRECT: Direct LDL: 150.9 mg/dL

## 2013-04-15 ENCOUNTER — Other Ambulatory Visit: Payer: Self-pay | Admitting: Internal Medicine

## 2013-04-18 NOTE — Progress Notes (Signed)
Chart audit for unit stats.

## 2013-04-29 ENCOUNTER — Telehealth: Payer: Self-pay | Admitting: *Deleted

## 2013-04-29 MED ORDER — GLUCOSE BLOOD VI STRP: ORAL_STRIP | Status: DC

## 2013-04-29 MED ORDER — TRUERESULT BLOOD GLUCOSE W/DEVICE KIT: PACK | Status: DC

## 2013-04-29 NOTE — Telephone Encounter (Signed)
Request received and faxed to [employer]

## 2013-05-02 NOTE — Telephone Encounter (Signed)
Original fax re-sent

## 2013-05-02 NOTE — Telephone Encounter (Signed)
Pt left v/m; pt got call from [employer] about diabetic supplies with no reply; pt request fax rx to [employer] for diabetic supplies and glucometer;fax # [phone]. Pt request cb.

## 2013-05-02 NOTE — Telephone Encounter (Signed)
Spoke to pt and informed her request re-faxed

## 2013-05-03 ENCOUNTER — Other Ambulatory Visit (HOSPITAL_COMMUNITY)
Admission: RE | Admit: 2013-05-03 | Discharge: 2013-05-03 | Disposition: A | Discharge status: Home / Self Care | Payer: Medicare HMO | Source: Ambulatory Visit | Attending: Family Medicine | Admitting: Family Medicine

## 2013-05-03 ENCOUNTER — Encounter: Payer: Self-pay | Admitting: Family Medicine

## 2013-05-03 ENCOUNTER — Ambulatory Visit (INDEPENDENT_AMBULATORY_CARE_PROVIDER_SITE_OTHER): Payer: Medicare HMO | Admitting: Family Medicine

## 2013-05-03 VITALS — BP 122/62 | HR 61 | Temp 97.8°F | Ht 60.0 in | Wt 126.2 lb

## 2013-05-03 DIAGNOSIS — N898 Other specified noninflammatory disorders of vagina: Secondary | ICD-10-CM

## 2013-05-03 DIAGNOSIS — R300 Dysuria: Secondary | ICD-10-CM

## 2013-05-03 DIAGNOSIS — Z124 Encounter for screening for malignant neoplasm of cervix: Secondary | ICD-10-CM | POA: Insufficient documentation

## 2013-05-03 DIAGNOSIS — Z1151 Encounter for screening for human papillomavirus (HPV): Secondary | ICD-10-CM | POA: Insufficient documentation

## 2013-05-03 DIAGNOSIS — Z0000 Encounter for general adult medical examination without abnormal findings: Secondary | ICD-10-CM | POA: Insufficient documentation

## 2013-05-03 DIAGNOSIS — R82998 Other abnormal findings in urine: Secondary | ICD-10-CM

## 2013-05-03 DIAGNOSIS — E119 Type 2 diabetes mellitus without complications: Secondary | ICD-10-CM

## 2013-05-03 DIAGNOSIS — E785 Hyperlipidemia, unspecified: Secondary | ICD-10-CM

## 2013-05-03 DIAGNOSIS — R8290 Unspecified abnormal findings in urine: Secondary | ICD-10-CM

## 2013-05-03 DIAGNOSIS — Z01419 Encounter for gynecological examination (general) (routine) without abnormal findings: Secondary | ICD-10-CM | POA: Insufficient documentation

## 2013-05-03 DIAGNOSIS — I10 Essential (primary) hypertension: Secondary | ICD-10-CM

## 2013-05-03 DIAGNOSIS — C8590 Non-Hodgkin lymphoma, unspecified, unspecified site: Secondary | ICD-10-CM

## 2013-05-03 DIAGNOSIS — N760 Acute vaginitis: Secondary | ICD-10-CM | POA: Insufficient documentation

## 2013-05-03 DIAGNOSIS — J449 Chronic obstructive pulmonary disease, unspecified: Secondary | ICD-10-CM

## 2013-05-03 DIAGNOSIS — Z23 Encounter for immunization: Secondary | ICD-10-CM

## 2013-05-03 DIAGNOSIS — C8589 Other specified types of non-Hodgkin lymphoma, extranodal and solid organ sites: Secondary | ICD-10-CM

## 2013-05-03 DIAGNOSIS — M810 Age-related osteoporosis without current pathological fracture: Secondary | ICD-10-CM

## 2013-05-03 DIAGNOSIS — Z1231 Encounter for screening mammogram for malignant neoplasm of breast: Secondary | ICD-10-CM

## 2013-05-03 LAB — POCT URINALYSIS DIPSTICK
Bilirubin, UA: NEGATIVE
Blood, UA: NEGATIVE
Glucose, UA: NEGATIVE
KETONES UA: NEGATIVE
Leukocytes, UA: NEGATIVE
Nitrite, UA: NEGATIVE
PH UA: 5
PROTEIN UA: NEGATIVE
UROBILINOGEN UA: 0.2

## 2013-05-03 NOTE — Assessment & Plan Note (Signed)
The patients weight, height, BMI and visual acuity have been recorded in the chart I have made referrals, counseling and provided education to the patient based review of the above and I have provided the pt with a written personalized care plan for preventive services.  Orders Placed This Encounter  Procedures  . MM Digital Screening  . DG Bone Density  . Pneumococcal conjugate vaccine 13-valent IM  . Urinalysis Dipstick   Prevnar booster today. Advised asking insurance company about zostavax coverage.

## 2013-05-03 NOTE — Assessment & Plan Note (Signed)
Discussed USPSTF recommendations of cervical cancer screening.  She is aware that she does not need one since she is s/p hysterectomy but pap smear performed today per pt request. Mammogram ordered.

## 2013-05-03 NOTE — Assessment & Plan Note (Signed)
UA neg- reassurance provided. ? Vaginitis- awaiting results.

## 2013-05-03 NOTE — Progress Notes (Signed)
Pre visit review using our clinic review tool, if applicable. No additional management support is needed unless otherwise documented below in the visit note. 

## 2013-05-03 NOTE — Assessment & Plan Note (Signed)
Well controlled. No changes. 

## 2013-05-03 NOTE — Assessment & Plan Note (Signed)
Improved since she quit smoking!

## 2013-05-03 NOTE — Progress Notes (Signed)
Subjective:    Patient ID: Marie Holmes, female    DOB: 11-Aug-1935, 78 y.o.   MRN: [id number]  HPI 78 yo very pleasant female with h/o DM, HTN, COPD, pulmonary neoplasm/NHL here for medicare wellness visit. She is requesting a Pap smear today.  I have personally reviewed the Medicare Annual Wellness questionnaire and have noted 1. The patient's medical and social history 2. Their use of alcohol, tobacco or illicit drugs 3. Their current medications and supplements 4. The patient's functional ability including ADL's, fall risks, home safety risks and hearing or visual             impairment. 5. Diet and physical activities 6. Evidence for depression or mood disorders  End of life wishes discussed and updated in Social History.  Urinary frequency-   H/o recurrent UTIs.  Has had several neg UAs with symptoms (most recently 04/11/13).  Quit smoking two weeks ago!  Cold [place]!  Breathing already better. Does have h/o COPD.  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 Earlie Server. S/p RXT (Dr. Dwana Curd) Last saw him on 04/08/2013- note reviewed. No disease progression- advised continued observation and repeat CT scan in 6 months.  On Toprol 100 mg XL.   She denies any HA, CP, blurred vision, SOB or LE edema.  Lab Results  Component Value Date   CREATININE 0.8 04/04/2013   DM- on glucotrol 5 mg twice daily.  Could not tolerate Metformin due to diarrhea. Lab Results  Component Value Date   HGBA1C 6.9* 04/11/2013   Lab Results  Component Value Date   CHOL 224* 04/11/2013   HDL 53.20 04/11/2013   LDLCALC 122* 03/15/2011   LDLDIRECT 150.9 04/11/2013   TRIG 164.0* 04/11/2013   CHOLHDL 4 04/11/2013     Patient Active Problem List   Diagnosis Date Noted  . Back pain 04/11/2013  . Abnormal urine odor 04/11/2013  . Urinary  frequency 02/27/2013  . Non Hodgkin's lymphoma 11/28/2012  . COLONIC POLYPS 09/24/2008  . DIABETES MELLITUS, TYPE II 08/31/2007  . HYPERLIPIDEMIA 05/11/2006  . ANXIETY 05/11/2006  . HYPERTENSION 05/11/2006  . VENOUS INSUFFICIENCY, CHRONIC 05/11/2006  . COPD 05/11/2006  . ANEMIA, IRON DEFICIENCY, UNSPEC. 04/20/2006  . GASTROESOPHAGEAL REFLUX, NO ESOPHAGITIS 04/20/2006  . IRRITABLE BOWEL SYNDROME 04/20/2006  . OSTEOARTHRITIS, MULTI SITES 04/20/2006  . Osteoporosis, unspecified 04/20/2006  . INSOMNIA NOS 04/20/2006  . INCONTINENCE, URGE 04/20/2006   Past Medical History  Diagnosis Date  . Benign tumor of breast 2004    left  . Colon polyp   . Broken ribs     history of several broken ribs on right and compression  fx  . Fibroma of skin     Multiple fibromas/lipomas on arms and legs B  . Pyelonephritis   . Anxiety   . COPD (chronic obstructive pulmonary disease)   . Hyperlipidemia   . Osteoarthritis   . History of CT scan of abdomen 05/23/2003    Mild fatty liver inf, tiny hypodensity in right lobe of liver  . Kidney stone 02/21/1981    Right/removed  . Pulmonary nodules   . Diabetes mellitus without complication   . Cancer     non-hodgkins b cell lymphoma  . 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 Hx of radiation therapy 04/11/12- 05/08/12    left chest wall area, 40  gray 20 fx  . Hypertension    Past Surgical History  Procedure Laterality Date  . Appendectomy  02/21/1961  . Exploratory laparotomy  02/21/1970    for ? adhesions  . Abdominal hysterectomy  02/21/1970    right ovary remaining  . Bone marrow biopsy  03/09/2012  . Lung core biopsy  02/06/2012   History  Substance Use Topics  . Smoking status: Former Smoker -- 0.50 packs/day for 30 years    Types: Cigarettes  . Smokeless tobacco: Never Used     Comment: 3/4 pack X 50 years  . Alcohol Use: No   Family History  Problem Relation Age of Onset  .  Parkinsonism Brother   . Stroke Father   . Cancer Sister    Allergies  Allergen Reactions  . Sulfonamide Derivatives Anaphylaxis  . Chantix [Varenicline]     Made her angry, nausea, made blood pressure go up   Current Outpatient Prescriptions on File Prior to Visit  Medication Sig Dispense Refill  . ALPRAZolam (XANAX) 0.5 MG tablet Take 1 tablet (0.5 mg total) by mouth 3 (three) times daily as needed for sleep or anxiety.  90 tablet  0  . aspirin EC 81 MG tablet Take 81 mg by mouth every morning.      . Blood Glucose Monitoring Suppl (TRUERESULT BLOOD GLUCOSE) W/DEVICE KIT Use to check blood sugar tid and or as directed. Dx 250.00  1 each  0  . citalopram (CELEXA) 40 MG tablet Take 1 tablet (40 mg total) by mouth every evening.  30 tablet  6  . glipiZIDE (GLUCOTROL) 5 MG tablet Take 1 tablet (5 mg total) by mouth 2 (two) times daily before a meal.  60 tablet  0  . glucose blood (TRUETEST TEST) test strip Use as instructed  100 each  12  . ibuprofen (ADVIL,MOTRIN) 200 MG tablet Take 400 mg by mouth every 8 (eight) hours as needed for pain. For pain      . losartan (COZAAR) 50 MG tablet Take 1 tablet (50 mg total) by mouth every morning.  30 tablet  5  . metoprolol succinate (TOPROL-XL) 100 MG 24 hr tablet Take 1 tablet (100 mg total) by mouth every morning.  30 tablet  5  . vitamin B-12 (CYANOCOBALAMIN) 500 MCG tablet Take 500 mcg by mouth daily.       No current facility-administered medications on file prior to visit.   The PMH, PSH, Social History, Family History, Medications, and allergies have been reviewed in [facility], and have been updated if relevant.   Review of Systems  See HPI Patient reports no  vision/ hearing changes, fever ,adenopathy, persistant / recurrent hoarseness, swallowing issues, chest pain, edema,persistant / recurrent cough, hemoptysis, abnormal bruising/bleeding, major joint swelling, breast masses or abnormal vaginal bleeding.       Objective:   Physical Exam   BP 122/62  Pulse 61  Temp(Src) 97.8 F (36.6 C) (Oral)  Ht 5' (1.524 m)  Wt 126 lb 4 oz (57.267 kg)  BMI 24.66 kg/m2  SpO2 96% Wt Readings from Last 3 Encounters:  05/03/13 126 lb 4 oz (57.267 kg)  04/11/13 125 lb 12 oz (57.04 kg)  04/08/13 121 lb 4.8 oz (55.021 kg)  General:  Well-developed,well-nourished,in no acute distress; alert,appropriate and cooperative throughout examination Head:  normocephalic and atraumatic.   Eyes:  vision grossly intact, pupils equal, pupils round, and pupils reactive to light.   Ears:  R ear normal and L ear normal.  Nose:  no external deformity.   Mouth:  good dentition.   Neck:  No deformities, masses, or tenderness noted. Breasts:  No mass, nodules, thickening, tenderness, bulging, retraction, inflamation, nipple discharge or skin changes noted.   Lungs:  Normal respiratory effort, chest expands symmetrically. Lungs are clear to auscultation, no crackles or wheezes. Heart:  Normal rate and regular rhythm. S1 and S2 normal without gallop, murmur, click, rub or other extra sounds. Abdomen:  Bowel sounds positive,abdomen soft and non-tender without masses, organomegaly or hernias noted. Rectal:  no external abnormalities.   Genitalia:  Pelvic Exam:        External: normal female genitalia without lesions or masses        Vagina: normal without lesions or masses        Cervix: absent        Adnexa: normal bimanual exam without masses or fullness        Uterus: absent        Pap smear: performed Msk:  No deformity or scoliosis noted of thoracic or lumbar spine.   Extremities:  No clubbing, cyanosis, edema, or deformity noted with normal full range of motion of all joints.   Neurologic:  alert & oriented X3 and gait normal.   Skin:  Intact without suspicious lesions or rashes Cervical Nodes:  No lymphadenopathy noted Axillary Nodes:  No palpable lymphadenopathy Psych:  Cognition and judgment appear intact. Alert and cooperative with normal  attention span and concentration. No apparent delusions, illusions, hallucinations  Assessment & Plan:

## 2013-05-03 NOTE — Assessment & Plan Note (Signed)
Doing well.  On obs with repeat CT in 6 months. Followed by Dr. Earlie Server.

## 2013-05-03 NOTE — Patient Instructions (Addendum)
It was great to see you.  Please call to set up your mammogram and bone density scan.  I am so proud of you for quitting!  Check with your insurance to see if they will cover the shingles shot.

## 2013-05-03 NOTE — Assessment & Plan Note (Signed)
Reasonable control.

## 2013-05-06 ENCOUNTER — Telehealth: Payer: Self-pay | Admitting: Family Medicine

## 2013-05-06 NOTE — Telephone Encounter (Signed)
Relevant patient education mailed to patient.  

## 2013-05-06 NOTE — Telephone Encounter (Signed)
Relevant patient education assigned to patient using Emmi. ° °

## 2013-05-07 ENCOUNTER — Other Ambulatory Visit: Payer: Self-pay | Admitting: *Deleted

## 2013-05-07 LAB — CERVICOVAGINAL ANCILLARY ONLY
Bacterial vaginitis: NEGATIVE
Candida vaginitis: NEGATIVE

## 2013-05-07 MED ORDER — ALPRAZOLAM 0.5 MG PO TABS: 0.5000 mg | ORAL_TABLET | Freq: Three times a day (TID) | ORAL | Status: DC | PRN

## 2013-05-07 NOTE — Telephone Encounter (Signed)
Last filled 02/27/13

## 2013-05-07 NOTE — Telephone Encounter (Signed)
Spoke to pt and informed her Rx has been called in to requested pharmacy 

## 2013-05-08 ENCOUNTER — Encounter: Payer: Self-pay | Admitting: *Deleted

## 2013-05-20 ENCOUNTER — Encounter

## 2013-05-23 ENCOUNTER — Ambulatory Visit
Admission: RE | Admit: 2013-05-23 | Discharge: 2013-05-23 | Disposition: A | Discharge status: Home / Self Care | Payer: Commercial Managed Care - HMO | Source: Ambulatory Visit | Attending: Family Medicine | Admitting: Family Medicine

## 2013-05-23 DIAGNOSIS — Z1231 Encounter for screening mammogram for malignant neoplasm of breast: Secondary | ICD-10-CM

## 2013-05-23 DIAGNOSIS — M810 Age-related osteoporosis without current pathological fracture: Secondary | ICD-10-CM

## 2013-05-28 ENCOUNTER — Other Ambulatory Visit: Payer: Self-pay | Admitting: Family Medicine

## 2013-05-28 DIAGNOSIS — R928 Other abnormal and inconclusive findings on diagnostic imaging of breast: Secondary | ICD-10-CM

## 2013-06-03 ENCOUNTER — Ambulatory Visit (INDEPENDENT_AMBULATORY_CARE_PROVIDER_SITE_OTHER): Payer: Medicare HMO | Admitting: Family Medicine

## 2013-06-03 ENCOUNTER — Other Ambulatory Visit: Payer: Self-pay | Admitting: Family Medicine

## 2013-06-03 ENCOUNTER — Encounter: Payer: Self-pay | Admitting: Family Medicine

## 2013-06-03 VITALS — BP 138/70 | HR 78 | Temp 97.8°F | Wt 128.5 lb

## 2013-06-03 DIAGNOSIS — M810 Age-related osteoporosis without current pathological fracture: Secondary | ICD-10-CM

## 2013-06-03 MED ORDER — GLIPIZIDE 5 MG PO TABS: 5.0000 mg | ORAL_TABLET | Freq: Two times a day (BID) | ORAL | Status: DC

## 2013-06-03 MED ORDER — ALENDRONATE SODIUM 70 MG PO TABS: 70.0000 mg | ORAL_TABLET | ORAL | Status: DC

## 2013-06-03 MED ORDER — LOSARTAN POTASSIUM 50 MG PO TABS
50.0000 mg | ORAL_TABLET | Freq: Every morning | ORAL | Status: DC
Start: 2013-06-03 — End: 2013-12-26

## 2013-06-03 NOTE — Progress Notes (Signed)
Pre visit review using our clinic review tool, if applicable. No additional management support is needed unless otherwise documented below in the visit note. 

## 2013-06-03 NOTE — Progress Notes (Signed)
Subjective:   Patient ID: Marie Holmes, female    DOB: 10/07/1935, 78 y.o.   MRN: [id number]  Marie Holmes is a pleasant 78 y.o. year old female who presents to clinic today with Osteoporosis  on 06/03/2013  HPI: T score spine -2.4 Tscore femur -3.2    H/o radiation tx for pulmonary lymphoid neoplasm.  Years ago was on a bisphosphonate but never really took it.  Felt she did not need it and has refused treatment since.  Patient Active Problem List   Diagnosis Date Noted  . Medicare annual wellness visit, subsequent 05/03/2013  . Encounter for routine gynecological examination 05/03/2013  . Back pain 04/11/2013  . Abnormal urine odor 04/11/2013  . Urinary frequency 02/27/2013  . Non Hodgkin's lymphoma 11/28/2012  . COLONIC POLYPS 09/24/2008  . DIABETES MELLITUS, TYPE II 08/31/2007  . HYPERLIPIDEMIA 05/11/2006  . ANXIETY 05/11/2006  . HYPERTENSION 05/11/2006  . VENOUS INSUFFICIENCY, CHRONIC 05/11/2006  . COPD 05/11/2006  . ANEMIA, IRON DEFICIENCY, UNSPEC. 04/20/2006  . GASTROESOPHAGEAL REFLUX, NO ESOPHAGITIS 04/20/2006  . IRRITABLE BOWEL SYNDROME 04/20/2006  . OSTEOARTHRITIS, MULTI SITES 04/20/2006  . Osteoporosis, unspecified 04/20/2006  . INSOMNIA NOS 04/20/2006  . INCONTINENCE, URGE 04/20/2006   Past Medical History  Diagnosis Date  . Benign tumor of breast 2004    left  . Colon polyp   . Broken ribs     history of several broken ribs on right and compression  fx  . Fibroma of skin     Multiple fibromas/lipomas on arms and legs B  . Pyelonephritis   . Anxiety   . COPD (chronic obstructive pulmonary disease)   . Hyperlipidemia   . Osteoarthritis   . History of CT scan of abdomen 05/23/2003    Mild fatty liver inf, tiny hypodensity in right lobe of liver  . Kidney stone 02/21/1981    Right/removed  . Pulmonary nodules   . Diabetes mellitus without complication   . Cancer     non-hodgkins b cell lymphoma  . Lung cancer     pulmonary lymphoid neoplasm  questionable for non-Hodgkins lymphoma  . Hx of radiation therapy 04/11/12- 05/08/12    left chest wall area, 40 gray 20 fx  . Hypertension    Past Surgical History  Procedure Laterality Date  . Appendectomy  02/21/1961  . Exploratory laparotomy  02/21/1970    for ? adhesions  . Abdominal hysterectomy  02/21/1970    right ovary remaining  . Bone marrow biopsy  03/09/2012  . Lung core biopsy  02/06/2012   History  Substance Use Topics  . Smoking status: Former Smoker -- 0.50 packs/day for 30 years    Types: Cigarettes  . Smokeless tobacco: Never Used     Comment: 3/4 pack X 50 years  . Alcohol Use: No   Family History  Problem Relation Age of Onset  . Parkinsonism Brother   . Stroke Father   . Cancer Sister    Allergies  Allergen Reactions  . Sulfonamide Derivatives Anaphylaxis  . Chantix [Varenicline]     Made her angry, nausea, made blood pressure go up   Current Outpatient Prescriptions on File Prior to Visit  Medication Sig Dispense Refill  . albuterol (PROVENTIL HFA;VENTOLIN HFA) 108 (90 BASE) MCG/ACT inhaler Inhale 2 puffs into the lungs every 6 (six) hours as needed for wheezing or shortness of breath.      . ALPRAZolam (XANAX) 0.5 MG tablet Take 1 tablet (0.5 mg total) by mouth 3 (  three) times daily as needed for sleep or anxiety.  90 tablet  0  . aspirin EC 81 MG tablet Take 81 mg by mouth every morning.      . Blood Glucose Monitoring Suppl (TRUERESULT BLOOD GLUCOSE) W/DEVICE KIT Use to check blood sugar tid and or as directed. Dx 250.00  1 each  0  . citalopram (CELEXA) 40 MG tablet Take 1 tablet (40 mg total) by mouth every evening.  30 tablet  6  . glucose blood (TRUETEST TEST) test strip Use as instructed  100 each  12  . ibuprofen (ADVIL,MOTRIN) 200 MG tablet Take 400 mg by mouth every 8 (eight) hours as needed for pain. For pain      . metoprolol succinate (TOPROL-XL) 100 MG 24 hr tablet Take 1 tablet (100 mg total) by mouth every morning.  30 tablet  5  .  vitamin B-12 (CYANOCOBALAMIN) 500 MCG tablet Take 500 mcg by mouth daily.       No current facility-administered medications on file prior to visit.   The PMH, PSH, Social History, Family History, Medications, and allergies have been reviewed in [facility], and have been updated if relevant.   Review of Systems    See HPI +hip pain Objective:    BP 138/70  Pulse 78  Temp(Src) 97.8 F (36.6 C) (Oral)  Wt 128 lb 8 oz (58.287 kg)  SpO2 94%   Physical Exam  Nursing note and vitals reviewed. Constitutional: She appears well-developed and well-nourished. No distress.  Skin: Skin is warm and dry.  Psychiatric: She has a normal mood and affect. Her behavior is normal. Judgment and thought content normal.          Assessment & Plan:   Osteoporosis, unspecified No Follow-up on file.

## 2013-06-03 NOTE — Patient Instructions (Signed)
Great to see you. We are starting Fosamax- 1 tablet weekly.  Keep me updated with how your are tolerating this.

## 2013-06-03 NOTE — Assessment & Plan Note (Signed)
>  25 minutes spent in face to face time with patient, >50% spent in counselling or coordination of care Agrees to start bisphosphonate. Rx sent to pharmacy for fosamax weekly. The patient indicates understanding of these issues and agrees with the plan.

## 2013-06-07 ENCOUNTER — Ambulatory Visit
Admission: RE | Admit: 2013-06-07 | Discharge: 2013-06-07 | Disposition: A | Discharge status: Home / Self Care | Payer: Commercial Managed Care - HMO | Source: Ambulatory Visit | Attending: Family Medicine | Admitting: Family Medicine

## 2013-06-07 DIAGNOSIS — R928 Other abnormal and inconclusive findings on diagnostic imaging of breast: Secondary | ICD-10-CM

## 2013-06-19 ENCOUNTER — Ambulatory Visit: Payer: Commercial Managed Care - HMO | Admitting: Family Medicine

## 2013-06-19 ENCOUNTER — Ambulatory Visit (INDEPENDENT_AMBULATORY_CARE_PROVIDER_SITE_OTHER): Payer: Medicare HMO | Admitting: Family Medicine

## 2013-06-19 ENCOUNTER — Encounter: Payer: Self-pay | Admitting: Family Medicine

## 2013-06-19 VITALS — BP 128/74 | HR 73 | Temp 97.8°F | Wt 130.5 lb

## 2013-06-19 DIAGNOSIS — I889 Nonspecific lymphadenitis, unspecified: Secondary | ICD-10-CM | POA: Insufficient documentation

## 2013-06-19 NOTE — Progress Notes (Signed)
Subjective:   Patient ID: Marie Holmes, female    DOB: [birth year], 78 y.o.   MRN: [id number]  Marie Holmes is a pleasant 78 y.o. year old female who presents to clinic today with Leg Pain  on 06/19/2013  HPI: Noticed a few days ago, painful lumps in right leg.  Feels "feverish" at times. Does have h/o non hodgkins lymphoma.  Lab Results  Component Value Date   WBC 7.1 04/04/2013   HGB 14.2 04/04/2013   HCT 41.8 04/04/2013   MCV 92.4 04/04/2013   PLT 262 04/04/2013   Patient Active Problem List   Diagnosis Date Noted  . Lymphadenitis 06/19/2013  . Medicare annual wellness visit, subsequent 05/03/2013  . Encounter for routine gynecological examination 05/03/2013  . Back pain 04/11/2013  . Abnormal urine odor 04/11/2013  . Urinary frequency 02/27/2013  . Non Hodgkin's lymphoma 11/28/2012  . COLONIC POLYPS 09/24/2008  . DIABETES MELLITUS, TYPE II 08/31/2007  . HYPERLIPIDEMIA 05/11/2006  . ANXIETY 05/11/2006  . HYPERTENSION 05/11/2006  . VENOUS INSUFFICIENCY, CHRONIC 05/11/2006  . COPD 05/11/2006  . ANEMIA, IRON DEFICIENCY, UNSPEC. 04/20/2006  . GASTROESOPHAGEAL REFLUX, NO ESOPHAGITIS 04/20/2006  . IRRITABLE BOWEL SYNDROME 04/20/2006  . OSTEOARTHRITIS, MULTI SITES 04/20/2006  . Osteoporosis, unspecified 04/20/2006  . INSOMNIA NOS 04/20/2006  . INCONTINENCE, URGE 04/20/2006   Past Medical History  Diagnosis Date  . Benign tumor of breast 2004    left  . Colon polyp   . Broken ribs     history of several broken ribs on right and compression  fx  . Fibroma of skin     Multiple fibromas/lipomas on arms and legs B  . Pyelonephritis   . Anxiety   . COPD (chronic obstructive pulmonary disease)   . Hyperlipidemia   . Osteoarthritis   . History of CT scan of abdomen 05/23/2003    Mild fatty liver inf, tiny hypodensity in right lobe of liver  . Kidney stone 02/21/1981    Right/removed  . Pulmonary nodules   . Diabetes mellitus without complication   . Cancer    non-hodgkins b cell lymphoma  . 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 Hx of radiation therapy 04/11/12- 05/08/12    left chest wall area, 40 gray 20 fx  . Hypertension    Past Surgical History  Procedure Laterality Date  . Appendectomy  02/21/1961  . Exploratory laparotomy  02/21/1970    for ? adhesions  . Abdominal hysterectomy  02/21/1970    right ovary remaining  . Bone marrow biopsy  03/09/2012  . Lung core biopsy  02/06/2012   History  Substance Use Topics  . Smoking status: Former Smoker -- 0.50 packs/day for 30 years    Types: Cigarettes  . Smokeless tobacco: Never Used     Comment: 3/4 pack X 50 years  . Alcohol Use: No   Family History  Problem Relation Age of Onset  . Parkinsonism Brother   . Stroke Father   . Cancer Sister    Allergies  Allergen Reactions  . Sulfonamide Derivatives Anaphylaxis  . Chantix [Varenicline]     Made her angry, nausea, made blood pressure go up   Current Outpatient Prescriptions on File Prior to Visit  Medication Sig Dispense Refill  . albuterol (PROVENTIL HFA;VENTOLIN HFA) 108 (90 BASE) MCG/ACT inhaler Inhale 2 puffs into the lungs every 6 (six) hours as needed for wheezing or shortness of breath.      Marland Kitchen  alendronate (FOSAMAX) 70 MG tablet Take 1 tablet (70 mg total) by mouth every 7 (seven) days. Take with a full glass of water on an empty stomach.  4 tablet  11  . ALPRAZolam (XANAX) 0.5 MG tablet Take 1 tablet (0.5 mg total) by mouth 3 (three) times daily as needed for sleep or anxiety.  90 tablet  0  . aspirin EC 81 MG tablet Take 81 mg by mouth every morning.      . Blood Glucose Monitoring Suppl (TRUERESULT BLOOD GLUCOSE) W/DEVICE KIT Use to check blood sugar tid and or as directed. Dx 250.00  1 each  0  . citalopram (CELEXA) 40 MG tablet Take 1 tablet (40 mg total) by mouth every evening.  30 tablet  6  . glipiZIDE (GLUCOTROL) 5 MG tablet Take 1 tablet (5 mg total) by mouth 2 (two)  times daily before a meal.  60 tablet  5  . glucose blood (TRUETEST TEST) test strip Use as instructed  100 each  12  . ibuprofen (ADVIL,MOTRIN) 200 MG tablet Take 400 mg by mouth every 8 (eight) hours as needed for pain. For pain      . losartan (COZAAR) 50 MG tablet Take 1 tablet (50 mg total) by mouth every morning.  30 tablet  5  . metoprolol succinate (TOPROL-XL) 100 MG 24 hr tablet Take 1 tablet (100 mg total) by mouth every morning.  30 tablet  5  . vitamin B-12 (CYANOCOBALAMIN) 500 MCG tablet Take 500 mcg by mouth daily.       No current facility-administered medications on file prior to visit.   The PMH, PSH, Social History, Family History, Medications, and allergies have been reviewed in [facility], and have been updated if relevant.   Review of Systems    See HPI No night sweats  Objective:    BP 128/74  Pulse 73  Temp(Src) 97.8 F (36.6 C) (Oral)  Wt 130 lb 8 oz (59.194 kg)  SpO2 96%   Physical Exam  Nursing note and vitals reviewed. Constitutional: She appears well-developed and well-nourished. No distress.  Lymphadenopathy:    She has no cervical adenopathy.    She has no axillary adenopathy.       Right: Inguinal adenopathy present.       Left: No inguinal adenopathy present.          Assessment & Plan:   Lymphadenitis - Plan: CBC with Differential, [place] Misc Soft Tissue No Follow-up on file.

## 2013-06-19 NOTE — Assessment & Plan Note (Signed)
CBC and ultrasound today. Advised pt to stop touching it as frequently as this can irritate it. The patient indicates understanding of these issues and agrees with the plan.

## 2013-06-19 NOTE — Patient Instructions (Signed)
Good to see you. I will call you with your lab results. Please stop by to see Marie Holmes to set up your ultrasound.

## 2013-06-19 NOTE — Progress Notes (Signed)
Pre visit review using our clinic review tool, if applicable. No additional management support is needed unless otherwise documented below in the visit note. 

## 2013-06-20 ENCOUNTER — Ambulatory Visit (HOSPITAL_COMMUNITY)
Admission: RE | Admit: 2013-06-20 | Discharge: 2013-06-20 | Disposition: A | Discharge status: Home / Self Care | Payer: Medicare HMO | Source: Ambulatory Visit | Attending: Family Medicine | Admitting: Family Medicine

## 2013-06-20 DIAGNOSIS — I889 Nonspecific lymphadenitis, unspecified: Secondary | ICD-10-CM

## 2013-06-20 DIAGNOSIS — M79609 Pain in unspecified limb: Secondary | ICD-10-CM | POA: Insufficient documentation

## 2013-06-20 DIAGNOSIS — Z8571 Personal history of Hodgkin lymphoma: Secondary | ICD-10-CM | POA: Insufficient documentation

## 2013-06-21 ENCOUNTER — Other Ambulatory Visit: Payer: Self-pay | Admitting: Family Medicine

## 2013-06-21 ENCOUNTER — Other Ambulatory Visit (INDEPENDENT_AMBULATORY_CARE_PROVIDER_SITE_OTHER): Payer: Commercial Managed Care - HMO

## 2013-06-21 DIAGNOSIS — Z139 Encounter for screening, unspecified: Secondary | ICD-10-CM

## 2013-06-21 DIAGNOSIS — R2241 Localized swelling, mass and lump, right lower limb: Secondary | ICD-10-CM

## 2013-06-21 DIAGNOSIS — I889 Nonspecific lymphadenitis, unspecified: Secondary | ICD-10-CM

## 2013-06-21 LAB — CBC WITH DIFFERENTIAL/PLATELET
BASOS ABS: 0 10*3/uL (ref 0.0–0.1)
Basophils Relative: 0.4 % (ref 0.0–3.0)
EOS ABS: 0.2 10*3/uL (ref 0.0–0.7)
Eosinophils Relative: 2.7 % (ref 0.0–5.0)
HEMATOCRIT: 43 % (ref 36.0–46.0)
Hemoglobin: 14.5 g/dL (ref 12.0–15.0)
LYMPHS ABS: 2.2 10*3/uL (ref 0.7–4.0)
Lymphocytes Relative: 25.4 % (ref 12.0–46.0)
MCHC: 33.7 g/dL (ref 30.0–36.0)
MCV: 92.3 fl (ref 78.0–100.0)
Monocytes Absolute: 0.8 10*3/uL (ref 0.1–1.0)
Monocytes Relative: 9.6 % (ref 3.0–12.0)
Neutro Abs: 5.3 10*3/uL (ref 1.4–7.7)
Neutrophils Relative %: 61.9 % (ref 43.0–77.0)
Platelets: 274 10*3/uL (ref 150.0–400.0)
RBC: 4.67 Mil/uL (ref 3.87–5.11)
RDW: 13.9 % (ref 11.5–14.6)
WBC: 8.6 10*3/uL (ref 4.5–10.5)

## 2013-06-21 LAB — BUN: BUN: 11 mg/dL (ref 6–23)

## 2013-06-21 LAB — CREATININE, SERUM: Creatinine, Ser: 0.8 mg/dL (ref 0.4–1.2)

## 2013-06-24 ENCOUNTER — Inpatient Hospital Stay: Admission: RE | Admit: 2013-06-24 | Payer: Medicare HMO | Source: Ambulatory Visit

## 2013-06-26 ENCOUNTER — Inpatient Hospital Stay: Admission: RE | Admit: 2013-06-26 | Payer: Medicare HMO | Source: Ambulatory Visit

## 2013-06-27 ENCOUNTER — Ambulatory Visit (INDEPENDENT_AMBULATORY_CARE_PROVIDER_SITE_OTHER)
Admission: RE | Admit: 2013-06-27 | Discharge: 2013-06-27 | Disposition: A | Discharge status: Home / Self Care | Payer: Medicare HMO | Source: Ambulatory Visit | Attending: Family Medicine | Admitting: Family Medicine

## 2013-06-27 DIAGNOSIS — R2241 Localized swelling, mass and lump, right lower limb: Secondary | ICD-10-CM

## 2013-06-27 DIAGNOSIS — R229 Localized swelling, mass and lump, unspecified: Secondary | ICD-10-CM

## 2013-06-27 MED ORDER — IOHEXOL 300 MG/ML  SOLN
100.0000 mL | Freq: Once | INTRAMUSCULAR | Status: AC | PRN
  Administered 2013-06-27: 100 mL via INTRAVENOUS

## 2013-06-28 ENCOUNTER — Other Ambulatory Visit: Payer: Self-pay | Admitting: Family Medicine

## 2013-06-28 DIAGNOSIS — R2240 Localized swelling, mass and lump, unspecified lower limb: Secondary | ICD-10-CM

## 2013-07-04 ENCOUNTER — Encounter (INDEPENDENT_AMBULATORY_CARE_PROVIDER_SITE_OTHER): Payer: Self-pay | Admitting: General Surgery

## 2013-07-04 ENCOUNTER — Ambulatory Visit (INDEPENDENT_AMBULATORY_CARE_PROVIDER_SITE_OTHER): Payer: Commercial Managed Care - HMO | Admitting: General Surgery

## 2013-07-04 VITALS — BP 126/70 | HR 73 | Temp 97.1°F | Ht 61.0 in | Wt 128.0 lb

## 2013-07-04 DIAGNOSIS — I872 Venous insufficiency (chronic) (peripheral): Secondary | ICD-10-CM

## 2013-07-04 NOTE — Progress Notes (Signed)
Patient ID: Marie Holmes, female   DOB: 04-03-1935, 78 y.o.   MRN: [id number]  Chief Complaint  Patient presents with  . eval mass on innner right thigh    HPI Marie Holmes is a 78 y.o. female.  The patient is a 78 year old female who is referred by Dr. Deborra Medina for evaluation of a right superficial thigh nodule. Patient underwent CT scan which was negative for any adenopathy. Patient says she has pain in 2 nodules in the medial aspect of her right thigh. Patient also has a history of incompetent venous valve disease. She states she has some aching pain at nighttime.  HPI  Past Medical History  Diagnosis Date  . Benign tumor of breast 2004    left  . Colon polyp   . Broken ribs     history of several broken ribs on right and compression  fx  . Fibroma of skin     Multiple fibromas/lipomas on arms and legs B  . Pyelonephritis   . Anxiety   . COPD (chronic obstructive pulmonary disease)   . Hyperlipidemia   . Osteoarthritis   . History of CT scan of abdomen 05/23/2003    Mild fatty liver inf, tiny hypodensity in right lobe of liver  . Kidney stone 02/21/1981    Right/removed  . Pulmonary nodules   . Diabetes mellitus without complication   . Cancer     non-hodgkins b cell lymphoma  . 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 Hx of radiation therapy 04/11/12- 05/08/12    left chest wall area, 40 gray 20 fx  . Hypertension     Past Surgical History  Procedure Laterality Date  . Appendectomy  02/21/1961  . Exploratory laparotomy  02/21/1970    for ? adhesions  . Abdominal hysterectomy  02/21/1970    right ovary remaining  . Bone marrow biopsy  03/09/2012  . Lung core biopsy  02/06/2012    Family History  Problem Relation Age of Onset  . Parkinsonism Brother   . Stroke Father   . Cancer Sister     Social History History  Substance Use Topics  . Smoking status: Former Smoker -- 0.50 packs/day for 30 years    Types:  Cigarettes  . Smokeless tobacco: Never Used     Comment: 3/4 pack X 50 years  . Alcohol Use: No    Allergies  Allergen Reactions  . Sulfonamide Derivatives Anaphylaxis  . Chantix [Varenicline]     Made her angry, nausea, made blood pressure go up    Current Outpatient Prescriptions  Medication Sig Dispense Refill  . albuterol (PROVENTIL HFA;VENTOLIN HFA) 108 (90 BASE) MCG/ACT inhaler Inhale 2 puffs into the lungs every 6 (six) hours as needed for wheezing or shortness of breath.      Marland Kitchen alendronate (FOSAMAX) 70 MG tablet Take 1 tablet (70 mg total) by mouth every 7 (seven) days. Take with a full glass of water on an empty stomach.  4 tablet  11  . ALPRAZolam (XANAX) 0.5 MG tablet Take 1 tablet (0.5 mg total) by mouth 3 (three) times daily as needed for sleep or anxiety.  90 tablet  0  . aspirin EC 81 MG tablet Take 81 mg by mouth every morning.      . Blood Glucose Monitoring Suppl (TRUERESULT BLOOD GLUCOSE) W/DEVICE KIT Use to check blood sugar tid and or as directed. Dx 250.00  1 each  0  .  citalopram (CELEXA) 40 MG tablet Take 1 tablet (40 mg total) by mouth every evening.  30 tablet  6  . glipiZIDE (GLUCOTROL) 5 MG tablet Take 1 tablet (5 mg total) by mouth 2 (two) times daily before a meal.  60 tablet  5  . glucose blood (TRUETEST TEST) test strip Use as instructed  100 each  12  . ibuprofen (ADVIL,MOTRIN) 200 MG tablet Take 400 mg by mouth every 8 (eight) hours as needed for pain. For pain      . losartan (COZAAR) 50 MG tablet Take 1 tablet (50 mg total) by mouth every morning.  30 tablet  5  . metoprolol succinate (TOPROL-XL) 100 MG 24 hr tablet Take 1 tablet (100 mg total) by mouth every morning.  30 tablet  5  . vitamin B-12 (CYANOCOBALAMIN) 500 MCG tablet Take 500 mcg by mouth daily.       No current facility-administered medications for this visit.    Review of Systems Review of Systems  Constitutional: Negative.   HENT: Negative.   Respiratory: Negative.     Cardiovascular: Negative.   Gastrointestinal: Negative.   Neurological: Negative.   All other systems reviewed and are negative.   Blood pressure 126/70, pulse 73, temperature 97.1 F (36.2 C), height 5' 1"  (1.549 m), weight 128 lb (58.06 kg).  Physical Exam Physical Exam  Constitutional: She is oriented to person, place, and time. She appears well-developed and well-nourished.  HENT:  Head: Normocephalic and atraumatic.  Eyes: Conjunctivae and EOM are normal. Pupils are equal, round, and reactive to light.  Neck: Normal range of motion. Neck supple.  Cardiovascular: Normal rate, regular rhythm and normal heart sounds.   Pulmonary/Chest: Effort normal and breath sounds normal.  Abdominal: Soft. Bowel sounds are normal. She exhibits no distension and no mass. There is no tenderness. There is no rebound and no guarding.  Musculoskeletal: Normal range of motion.  Neurological: She is alert and oriented to person, place, and time.  Skin: Skin is warm and dry.     Probable approximately 0.5 cm nodule x2 and a superficial skin  Psychiatric: She has a normal mood and affect.    Data Reviewed CT scan is negative for any lymphadenopathy  Assessment    78 year old female with likely incompetent venous valve issues.     Plan    1. This is rare she should place warm compresses, elevate her legs while in bed, and if she continues to have issues with pain on these nodules likely discuss this with a vascular surgeon. 2. The patient follow up as needed        Ralene Ok 07/04/2013, 10:58 AM

## 2013-07-05 ENCOUNTER — Other Ambulatory Visit: Payer: Self-pay | Admitting: *Deleted

## 2013-07-05 NOTE — Telephone Encounter (Signed)
Pt requesting medication refill. Last ov 05/2013 with f/u appt 07/2013. pls advise

## 2013-07-07 MED ORDER — ALPRAZOLAM 0.5 MG PO TABS: 0.5000 mg | ORAL_TABLET | Freq: Three times a day (TID) | ORAL | Status: DC | PRN

## 2013-07-08 ENCOUNTER — Other Ambulatory Visit: Payer: Self-pay | Admitting: *Deleted

## 2013-07-08 MED ORDER — METOPROLOL SUCCINATE ER 100 MG PO TB24: 100.0000 mg | ORAL_TABLET | Freq: Every morning | ORAL | Status: DC

## 2013-07-08 NOTE — Telephone Encounter (Signed)
Spoke to pt and informed her Rx called in to requested pharmacy

## 2013-07-08 NOTE — Telephone Encounter (Signed)
Received faxed refill request from pharmacy. Refill sent to pharmacy electronically. 

## 2013-08-12 ENCOUNTER — Encounter: Payer: Self-pay | Admitting: Family Medicine

## 2013-08-12 ENCOUNTER — Ambulatory Visit (INDEPENDENT_AMBULATORY_CARE_PROVIDER_SITE_OTHER): Payer: Medicare HMO | Admitting: Family Medicine

## 2013-08-12 VITALS — BP 116/68 | HR 71 | Temp 98.0°F | Wt 133.2 lb

## 2013-08-12 DIAGNOSIS — R300 Dysuria: Secondary | ICD-10-CM

## 2013-08-12 DIAGNOSIS — R159 Full incontinence of feces: Secondary | ICD-10-CM

## 2013-08-12 DIAGNOSIS — R109 Unspecified abdominal pain: Secondary | ICD-10-CM

## 2013-08-12 DIAGNOSIS — E119 Type 2 diabetes mellitus without complications: Secondary | ICD-10-CM

## 2013-08-12 LAB — MICROALBUMIN / CREATININE URINE RATIO
CREATININE, U: 22 mg/dL
MICROALB/CREAT RATIO: 0.5 mg/g (ref 0.0–30.0)
Microalb, Ur: 0.1 mg/dL (ref 0.0–1.9)

## 2013-08-12 LAB — COMPREHENSIVE METABOLIC PANEL
ALBUMIN: 4 g/dL (ref 3.5–5.2)
ALT: 10 U/L (ref 0–35)
AST: 16 U/L (ref 0–37)
Alkaline Phosphatase: 70 U/L (ref 39–117)
BUN: 14 mg/dL (ref 6–23)
CALCIUM: 9.4 mg/dL (ref 8.4–10.5)
CHLORIDE: 100 meq/L (ref 96–112)
CO2: 31 mEq/L (ref 19–32)
Creatinine, Ser: 0.9 mg/dL (ref 0.4–1.2)
GFR: 68.13 mL/min (ref >60.00)
Glucose, Bld: 162 mg/dL — ABNORMAL HIGH (ref 70–99)
Potassium: 4.3 mEq/L (ref 3.5–5.1)
SODIUM: 137 meq/L (ref 135–145)
Total Bilirubin: 0.9 mg/dL (ref 0.2–1.2)
Total Protein: 7 g/dL (ref 6.0–8.3)

## 2013-08-12 LAB — POCT URINALYSIS DIPSTICK
Bilirubin, UA: NEGATIVE
Glucose, UA: NEGATIVE
Ketones, UA: NEGATIVE
Leukocytes, UA: NEGATIVE
Nitrite, UA: NEGATIVE
PH UA: 6
PROTEIN UA: NEGATIVE
UROBILINOGEN UA: 0.2

## 2013-08-12 LAB — HEMOGLOBIN A1C: HEMOGLOBIN A1C: 7.9 % — AB (ref 4.6–6.5)

## 2013-08-12 NOTE — Assessment & Plan Note (Signed)
Symptoms resolving which is reassuring. UA essentially neg- will send for cx given trace RBC.

## 2013-08-12 NOTE — Assessment & Plan Note (Signed)
Continue current rx. Check urine micro, alc today.

## 2013-08-12 NOTE — Progress Notes (Signed)
Pre visit review using our clinic review tool, if applicable. No additional management support is needed unless otherwise documented below in the visit note. 

## 2013-08-12 NOTE — Patient Instructions (Signed)
Good to see you. I will call you with your lab results.  We will also call you with an appointment to see Dr. Olevia Perches- you can cancel this if your symptoms resolve.
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
## 2013-08-12 NOTE — Assessment & Plan Note (Signed)
Reassuring colonoscopy in 2011. Neg exam. I would like for her to see Dr. Olevia Perches given acute change. Referral placed. The patient indicates understanding of these issues and agrees with the plan.

## 2013-08-13 ENCOUNTER — Ambulatory Visit (INDEPENDENT_AMBULATORY_CARE_PROVIDER_SITE_OTHER): Payer: Commercial Managed Care - HMO | Admitting: Internal Medicine

## 2013-08-13 ENCOUNTER — Encounter: Payer: Self-pay | Admitting: Internal Medicine

## 2013-08-13 VITALS — BP 132/66 | HR 68 | Ht 61.0 in | Wt 133.4 lb

## 2013-08-13 DIAGNOSIS — K648 Other hemorrhoids: Secondary | ICD-10-CM

## 2013-08-13 DIAGNOSIS — R159 Full incontinence of feces: Secondary | ICD-10-CM

## 2013-08-13 MED ORDER — HYDROCORTISONE ACE-PRAMOXINE 2.5-1 % RE CREA: 1.0000 "application " | TOPICAL_CREAM | Freq: Three times a day (TID) | RECTAL | Status: DC

## 2013-08-13 MED ORDER — HYDROCORTISONE ACETATE 25 MG RE SUPP: 25.0000 mg | Freq: Every day | RECTAL | Status: DC

## 2013-08-13 NOTE — Progress Notes (Signed)
Marie Holmes 25-Feb-1935 [id number]  Note: This dictation was prepared with Dragon digital system. Any transcriptional errors that result from this procedure are unintentional.   History of Present Illness:  This is a 77 year old white female with a history of irritable bowel syndrome now with a new problem of fecal leakage. She has soreness around the rectum from wiping and has pain while having a bowel movement. She is having formed stools daily.. Her last colonoscopy in March 2011 showed moderate to severe diverticulosis of the sigmoid colon. A prior colonoscopy was completed in 1990. A CT scan of the abdomen in October 2014 showed no active process. She was recently diagnosed with lung cancer and underwent radiation. Her second husband just died recently and she has been rather distressed about it.    Past Medical History  Diagnosis Date  . Benign tumor of breast 2004    left  . Broken ribs     history of several broken ribs on right and compression  fx  . Fibroma of skin     Multiple fibromas/lipomas on arms and legs B  . Pyelonephritis   . Anxiety   . COPD (chronic obstructive pulmonary disease)   . Hyperlipidemia   . Osteoarthritis   . History of CT scan of abdomen 05/23/2003    Mild fatty liver inf, tiny hypodensity in right lobe of liver  . Kidney stone 02/21/1981    Right/removed  . Pulmonary nodules   . Diabetes mellitus without complication   . Cancer     non-hodgkins b cell lymphoma  . 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 Hx of radiation therapy 04/11/12- 05/08/12    left chest wall area, 40 gray 20 fx  . Hypertension   . Diverticulosis   . Adenomatous colon polyp     Past Surgical History  Procedure Laterality Date  . Appendectomy  02/21/1961  . Exploratory laparotomy  02/21/1970    for ? adhesions  . Abdominal hysterectomy  02/21/1970    right ovary remaining  . Bone marrow biopsy  03/09/2012  . Lung core  biopsy  02/06/2012    Allergies  Allergen Reactions  . Sulfonamide Derivatives Anaphylaxis  . Chantix [Varenicline]     Made her angry, nausea, made blood pressure go up    Family history and social history have been reviewed.  Review of Systems: Denies abdominal pain. Denies rectal bleeding  The remainder of the 10 point ROS is negative except as outlined in the H&P  Physical Exam: General Appearance Well developed, in no distress Eyes  Non icteric  HEENT  Non traumatic, normocephalic  Mouth No lesion, tongue papillated, no cheilosis Neck Supple without adenopathy, thyroid not enlarged, no carotid bruits, no JVD Lungs Clear to auscultation bilaterally COR Normal S1, normal S2, regular rhythm, no murmur, quiet precordium Abdomen protuberant abdomen diffusely tender with normoactive bowel sounds. No palpable mass no ascites Rectal and anoscopic exam reveals external hemorrhoidal tag. Tender anal canal, small first degree internal hemorrhoids. No thrombosis or erythema. Stool is Hemoccult negative. 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 She will increase bulk in her diet and will start Metamucil 1 teaspoon daily. We will also use Anusol-HC suppositories for first degree internal hemorrhoids and Analpram cream 2.5% for external hemorrhoidal tag.  I have asked her to cut out fried foods and high fat foods. She is up-to-date on her colonoscopy.    Delfin Edis 08/13/2013

## 2013-08-13 NOTE — Patient Instructions (Addendum)
We have sent the following medications to your pharmacy for you to pick up at your convenience: Anusol  Analpram  Please purchase Metamucil over the counter. Take as directed.  CC:Dr Arnette Norris

## 2013-08-14 ENCOUNTER — Encounter: Payer: Self-pay | Admitting: Internal Medicine

## 2013-08-14 LAB — URINE CULTURE
Colony Count: NO GROWTH
Organism ID, Bacteria: NO GROWTH

## 2013-08-15 ENCOUNTER — Telehealth: Payer: Self-pay | Admitting: Family Medicine

## 2013-08-15 NOTE — Telephone Encounter (Signed)
Spoke to pt and informed her of results.  

## 2013-08-15 NOTE — Telephone Encounter (Signed)
Patient returned your call.

## 2013-08-26 ENCOUNTER — Telehealth: Payer: Self-pay | Admitting: Family Medicine

## 2013-08-26 MED ORDER — SITAGLIPTIN PHOSPHATE 50 MG PO TABS: 50.0000 mg | ORAL_TABLET | Freq: Every day | ORAL | Status: DC

## 2013-08-26 NOTE — Telephone Encounter (Signed)
Spoke to pt who states that she has been trying to lower glucose readings by changing diet, but has been unsuccessful. She is requesting to increase meds as originally suggested based on labs. Pt wants new Rx sent to [facility].

## 2013-08-26 NOTE — Telephone Encounter (Signed)
Spoke to pt and informed her Rx has been sent to requested pharmacy 

## 2013-08-26 NOTE — Telephone Encounter (Signed)
Pt called with questions about her sugar medicine.  [phone]

## 2013-08-26 NOTE — Telephone Encounter (Signed)
Rx for [place] sent to pharmacy.  Follow up in 3 months.

## 2013-09-04 ENCOUNTER — Other Ambulatory Visit: Payer: Self-pay | Admitting: *Deleted

## 2013-09-04 ENCOUNTER — Other Ambulatory Visit: Payer: Self-pay | Admitting: Medical Oncology

## 2013-09-04 NOTE — Telephone Encounter (Signed)
Pt requesting medication refill. Last f/u appt 07/2013 with future appt 10/2013.pls advise

## 2013-09-05 MED ORDER — ALPRAZOLAM 0.5 MG PO TABS: 0.5000 mg | ORAL_TABLET | Freq: Three times a day (TID) | ORAL | Status: DC | PRN

## 2013-09-05 NOTE — Telephone Encounter (Signed)
My apologies..

## 2013-09-05 NOTE — Telephone Encounter (Signed)
Which rx?

## 2013-09-05 NOTE — Telephone Encounter (Signed)
Spoke to pt and informed her Rx has been called in to requested pharmacy 

## 2013-09-10 ENCOUNTER — Telehealth: Payer: Self-pay | Admitting: Family Medicine

## 2013-09-10 NOTE — Telephone Encounter (Signed)
Pt left v/m; pt taking Januvia 50 mg taking one daily for 2 weeks and has not brought BS down; FBS averaging 150 and after eats lunch(pt not eating a lot of food) BS averaging around 210. No nervousness or confusion; has had H/A on and off with night sweats since starting Januvia. [facility] [place]. Pt wants to wait to let Dr Deborra Medina review note and if condition changes or worsens will cb or go to UC if at night.pt request cb on 09/11/13.

## 2013-09-10 NOTE — Telephone Encounter (Signed)
Pt called she stated the new rx januvia 50mg   that dr Deborra Medina started her   It is not working.  She has been taking this for 2 weeks and it is not bringing her sugar down  It is running from 150 to 211.  She stated she is not eating that much. Transferred called to rena

## 2013-09-11 MED ORDER — SITAGLIPTIN PHOSPHATE 100 MG PO TABS: 100.0000 mg | ORAL_TABLET | Freq: Every day | ORAL | Status: DC

## 2013-09-11 NOTE — Telephone Encounter (Signed)
Would she feel ok with increasing the dose of Januvia or does she want to try a different agent?

## 2013-09-11 NOTE — Telephone Encounter (Signed)
Spoke to pt who states that it is at your discretion as to whether she should increase current meds or try an alternate. She states that she is ok with whichever you feel would be best

## 2013-09-11 NOTE — Telephone Encounter (Signed)
Let's try to increase the Januvia .Keep [place] updated.  eRx sent but ok to take two of the tablets she has at home until she runs out.

## 2013-09-11 NOTE — Telephone Encounter (Signed)
Spoke to pt and advised per Dr Aron; pt verbally expressed understanding.  

## 2013-09-16 NOTE — Telephone Encounter (Signed)
Spoke to pt who states that she is willing to change to an alternate injectable.

## 2013-09-16 NOTE — Telephone Encounter (Signed)
Would she be willing to restart byetta or a similar (cheaper) injectable?

## 2013-09-16 NOTE — Telephone Encounter (Addendum)
Pt left v/m; pt has been taking the increased dosage of Januvia and BS has been going higher. 09/15/13 FBS 275. Today FBS 161 at 8 AM pt ate 2 peanut butter crackers and at 11 AM BS was 194. Pt request cb.[facility].

## 2013-09-17 MED ORDER — LIRAGLUTIDE 18 MG/3ML ~~LOC~~ SOPN: PEN_INJECTOR | SUBCUTANEOUS | Status: DC

## 2013-09-17 NOTE — Telephone Encounter (Signed)
Rx for victoza sent to her pharmacy.  Let's back off of januvia- only 50 mg daily.  Call us in a day or two with an update,

## 2013-09-17 NOTE — Telephone Encounter (Signed)
Pt left v/m requesting clarification if pt is to take Januvia and Victoza.pt request cb.

## 2013-09-17 NOTE — Addendum Note (Signed)
Addended by: Lucille Passy on: 09/17/2013 07:20 AM   Modules accepted: Orders

## 2013-09-17 NOTE — Telephone Encounter (Signed)
Spoke to pt and advised per Dr Deborra Medina. Pt verbally expressed understanding and states that she will contact us with update. LM with pharmacy requesting that Rx for Januvia be put back and only the Victoza filled.

## 2013-09-18 NOTE — Telephone Encounter (Signed)
Lm on pts vm requesting a call back 

## 2013-09-18 NOTE — Telephone Encounter (Signed)
Spoke with pt's daughter and gave instructions from Dr. Deborra Medina.  She verbalized understanding and will call us with an update.

## 2013-09-20 ENCOUNTER — Other Ambulatory Visit: Payer: Self-pay

## 2013-09-20 NOTE — Telephone Encounter (Addendum)
Pt said Victoza and Januvia are $90/month and pt wants to know if can stop Januvia and restart glipizide 5 mg with continuing victoza.(pt does not need refill on Glipizide; pt has med at home). On 09/19/13 FBS 140; after supper BS was 80 and pt drank a gingerale, felt better. On 09/20/13 FBS 171. Pt did not have other BS readings. Pt is feeling better and breathing is better since BS coming down. Pt is going out of town on 09/22/13 for one week and request cb today. Dr Deborra Medina out of office today.Please advise.

## 2013-09-20 NOTE — Telephone Encounter (Signed)
Last filled 02/18/13 with 6 refills--last OV with Dr Aron--08/12/13--please advise if okay to refill

## 2013-09-20 NOTE — Telephone Encounter (Signed)
victoza has a rather high rate of hypoglycemia when taken with meds like glipizide so do not do that  If she cannot afford med and has to stop one for the next week - stop victoza  (do not add glipizide) and wait for further resp from Dr Deborra Medina when she returns

## 2013-09-20 NOTE — Telephone Encounter (Signed)
Pt said advised of Dr. Marliss Coots comments. Pt said she doesn't have any Januvia and only has victoza and glipizide. Pt advise not to restart glipizide until she speaks with Dr. Deborra Medina on Monday. Pt said she will take the victoza only because that is what she has. Pt said she discussed with Dr. Deborra Medina increasing the victoza so pt said if the 0.6mg  of victoza doesn't lower BS enough she can take 2 per Dr. Deborra Medina because she was going to increase the victoza to 1.2 mg. Pt advise me she is going to the beach next week so she will not be at home but she will call back on Monday afternoon to see what Dr. Deborra Medina recommends

## 2013-09-21 MED ORDER — CITALOPRAM HYDROBROMIDE 40 MG PO TABS: 40.0000 mg | ORAL_TABLET | Freq: Every evening | ORAL | Status: DC

## 2013-09-21 NOTE — Telephone Encounter (Signed)
Sent, to carry through next OV with PCP. Thanks.

## 2013-09-23 ENCOUNTER — Telehealth: Payer: Self-pay

## 2013-09-23 NOTE — Telephone Encounter (Signed)
Lm on pts vm requesting a call back 

## 2013-09-23 NOTE — Telephone Encounter (Signed)
Pt left v/m requesting refill [facility]italopram to [facility]; pt got # 7 while at beach. Pt does not have return # at beach. Pt will not return home until 09/30/13.

## 2013-09-23 NOTE — Telephone Encounter (Signed)
Pt left v/m; pt is at beach and should return home 09/30/13; pt is presently taking Victoza 0.6 mg in the morning and evening; pt does not have Januvia. Pt is feeling OK.  Pt left v/m that does not have contact # at beach.

## 2013-09-23 NOTE — Telephone Encounter (Signed)
Agree with Dr. Glori Bickers that I would not combine victoza with glipizide.  Can we call her pharmacy to find out what would be a cheaper alternative to victoza for her--if there is one? Thanks!

## 2013-09-30 MED ORDER — GLIPIZIDE 5 MG PO TABS: 5.0000 mg | ORAL_TABLET | Freq: Two times a day (BID) | ORAL | Status: DC

## 2013-09-30 NOTE — Telephone Encounter (Signed)
Please call to check on her.  How has her blood sugar been running?

## 2013-09-30 NOTE — Telephone Encounter (Signed)
Lm with pts daughter requesting her to have pt contact us when she arrives back

## 2013-09-30 NOTE — Telephone Encounter (Signed)
Spoke to pt who states that she was still taking glipizide. She still had some tabs remaining from her Original Rx. Pt already has f/u appt scheduled.

## 2013-09-30 NOTE — Telephone Encounter (Signed)
That is good.  What is she taking now?  I refilled her glipizide and updated her med list.  Follow up with me in 3 months.

## 2013-09-30 NOTE — Telephone Encounter (Signed)
Patient returned your call.  Call patient back at [phone].

## 2013-09-30 NOTE — Telephone Encounter (Signed)
Spoke to pt who states that BS readings have ranged from 118 to 150. Pt states that she is needing refill of glipizide 5mg  bid as she is not able to take the victoza

## 2013-10-07 ENCOUNTER — Ambulatory Visit (HOSPITAL_COMMUNITY)
Admission: RE | Admit: 2013-10-07 | Discharge: 2013-10-07 | Disposition: A | Discharge status: Home / Self Care | Payer: Medicare HMO | Source: Ambulatory Visit | Attending: Internal Medicine | Admitting: Internal Medicine

## 2013-10-07 ENCOUNTER — Other Ambulatory Visit (HOSPITAL_BASED_OUTPATIENT_CLINIC_OR_DEPARTMENT_OTHER): Payer: Commercial Managed Care - HMO

## 2013-10-07 ENCOUNTER — Encounter (HOSPITAL_COMMUNITY): Payer: Self-pay

## 2013-10-07 DIAGNOSIS — C8590 Non-Hodgkin lymphoma, unspecified, unspecified site: Secondary | ICD-10-CM

## 2013-10-07 DIAGNOSIS — C8589 Other specified types of non-Hodgkin lymphoma, extranodal and solid organ sites: Secondary | ICD-10-CM | POA: Insufficient documentation

## 2013-10-07 DIAGNOSIS — I10 Essential (primary) hypertension: Secondary | ICD-10-CM

## 2013-10-07 DIAGNOSIS — I2510 Atherosclerotic heart disease of native coronary artery without angina pectoris: Secondary | ICD-10-CM | POA: Insufficient documentation

## 2013-10-07 DIAGNOSIS — J438 Other emphysema: Secondary | ICD-10-CM | POA: Insufficient documentation

## 2013-10-07 LAB — CBC WITH DIFFERENTIAL/PLATELET
BASO%: 0.7 % (ref 0.0–2.0)
Basophils Absolute: 0 10*3/uL (ref 0.0–0.1)
EOS%: 2.3 % (ref 0.0–7.0)
Eosinophils Absolute: 0.2 10*3/uL (ref 0.0–0.5)
HEMATOCRIT: 42.7 % (ref 34.8–46.6)
HGB: 14.1 g/dL (ref 11.6–15.9)
LYMPH%: 21.3 % (ref 14.0–49.7)
MCH: 30.7 pg (ref 25.1–34.0)
MCHC: 33 g/dL (ref 31.5–36.0)
MCV: 93 fL (ref 79.5–101.0)
MONO#: 0.7 10*3/uL (ref 0.1–0.9)
MONO%: 9.3 % (ref 0.0–14.0)
NEUT#: 4.7 10*3/uL (ref 1.5–6.5)
NEUT%: 66.4 % (ref 38.4–76.8)
Platelets: 270 10*3/uL (ref 145–400)
RBC: 4.6 10*6/uL (ref 3.70–5.45)
RDW: 13.7 % (ref 11.2–14.5)
WBC: 7.1 10*3/uL (ref 3.9–10.3)
lymph#: 1.5 10*3/uL (ref 0.9–3.3)

## 2013-10-07 LAB — COMPREHENSIVE METABOLIC PANEL (CC13)
ALK PHOS: 76 U/L (ref 40–150)
ALT: 11 U/L (ref 0–55)
AST: 13 U/L (ref 5–34)
Albumin: 3.6 g/dL (ref 3.5–5.0)
Anion Gap: 8 mEq/L (ref 3–11)
BUN: 13.1 mg/dL (ref 7.0–26.0)
CALCIUM: 10 mg/dL (ref 8.4–10.4)
CO2: 28 mEq/L (ref 22–29)
Chloride: 105 mEq/L (ref 98–109)
Creatinine: 0.8 mg/dL (ref 0.6–1.1)
Glucose: 186 mg/dl — ABNORMAL HIGH (ref 70–140)
Potassium: 4.9 mEq/L (ref 3.5–5.1)
Sodium: 141 mEq/L (ref 136–145)
Total Bilirubin: 0.89 mg/dL (ref 0.20–1.20)
Total Protein: 6.6 g/dL (ref 6.4–8.3)

## 2013-10-07 LAB — LACTATE DEHYDROGENASE (CC13): LDH: 129 U/L (ref 125–245)

## 2013-10-07 MED ORDER — IOHEXOL 300 MG/ML  SOLN
80.0000 mL | Freq: Once | INTRAMUSCULAR | Status: AC | PRN
  Administered 2013-10-07: 80 mL via INTRAVENOUS

## 2013-10-14 ENCOUNTER — Ambulatory Visit (HOSPITAL_BASED_OUTPATIENT_CLINIC_OR_DEPARTMENT_OTHER): Payer: Commercial Managed Care - HMO | Admitting: Internal Medicine

## 2013-10-14 VITALS — BP 143/65 | HR 67 | Temp 97.8°F | Resp 19 | Ht 61.0 in | Wt 129.8 lb

## 2013-10-14 DIAGNOSIS — L989 Disorder of the skin and subcutaneous tissue, unspecified: Secondary | ICD-10-CM

## 2013-10-14 DIAGNOSIS — C8590 Non-Hodgkin lymphoma, unspecified, unspecified site: Secondary | ICD-10-CM

## 2013-10-14 DIAGNOSIS — D47Z9 Other specified neoplasms of uncertain behavior of lymphoid, hematopoietic and related tissue: Secondary | ICD-10-CM

## 2013-10-14 DIAGNOSIS — J449 Chronic obstructive pulmonary disease, unspecified: Secondary | ICD-10-CM

## 2013-10-14 DIAGNOSIS — I10 Essential (primary) hypertension: Secondary | ICD-10-CM

## 2013-10-14 NOTE — Progress Notes (Signed)
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 Sondra Come completed on 05/08/2012.  CURRENT THERAPY: Observation  INTERVAL HISTORY: Marie Holmes 78 y.o. female returns to the clinic today for followup visit accompanied by her daughter. The patient is feeling fine today with no specific complaints except for increasing fatigue and subcutaneous nodules started recent in several areas under her skin. She denied having any significant chest pain, cough, shortness of breath or hemoptysis. She has no significant weight loss or night sweats. She has repeat CT scan of the chest for evaluation of her disease and she is here for discussion of her scan results and recommendation regarding her condition.  MEDICAL HISTORY: Past Medical History  Diagnosis Date  . Benign tumor of breast 2004    left  . Broken ribs     history of several broken ribs on right and compression  fx  . Fibroma of skin     Multiple fibromas/lipomas on arms and legs B  . Pyelonephritis   . Anxiety   . COPD (chronic obstructive pulmonary disease)   . Hyperlipidemia   . Osteoarthritis   . History of CT scan of abdomen 05/23/2003    Mild fatty liver inf, tiny hypodensity in right lobe of liver  . Kidney stone 02/21/1981    Right/removed  . Pulmonary nodules   . Diabetes mellitus without complication   . Hx of radiation therapy 04/11/12- 05/08/12    left chest wall area, 40 gray 20 fx  . Hypertension   . Diverticulosis   . Adenomatous colon polyp   . Cancer     non-hodgkins b cell lymphoma  . Lung cancer     pulmonary lymphoid neoplasm questionable for non-Hodgkins lymphoma    ALLERGIES:  is allergic to sulfonamide derivatives and  chantix.  MEDICATIONS:  Current Outpatient Prescriptions  Medication Sig Dispense Refill  . albuterol (PROVENTIL HFA;VENTOLIN HFA) 108 (90 BASE) MCG/ACT inhaler Inhale 2 puffs into the lungs every 6 (six) hours as needed for wheezing or shortness of breath.      Marland Kitchen alendronate (FOSAMAX) 70 MG tablet Take 1 tablet (70 mg total) by mouth every 7 (seven) days. Take with a full glass of water on an empty stomach.  4 tablet  11  . ALPRAZolam (XANAX) 0.5 MG tablet Take 1 tablet (0.5 mg total) by mouth 3 (three) times daily as needed for sleep or anxiety.  90 tablet  0  . aspirin EC 81 MG tablet Take 81 mg by mouth every morning.      . Blood Glucose Monitoring Suppl (TRUERESULT BLOOD GLUCOSE) W/DEVICE KIT Use to check blood sugar tid and or as directed. Dx 250.00  1 each  0  . citalopram (CELEXA) 40 MG tablet Take 1 tablet (40 mg total) by mouth every evening.  30 tablet  1  . glipiZIDE (GLUCOTROL) 5 MG tablet Take 1 tablet (5 mg total) by mouth 2 (two) times daily before a meal.  60 tablet  5  . glucose blood (TRUETEST TEST) test strip Use as instructed  100 each  12  . ibuprofen (ADVIL,MOTRIN) 200 MG tablet Take 400 mg by mouth every 8 (eight) hours as needed for pain. For pain      .  losartan (COZAAR) 50 MG tablet Take 1 tablet (50 mg total) by mouth every morning.  30 tablet  5  . metoprolol succinate (TOPROL-XL) 100 MG 24 hr tablet Take 1 tablet (100 mg total) by mouth every morning.  30 tablet  5  . vitamin B-12 (CYANOCOBALAMIN) 500 MCG tablet Take 500 mcg by mouth daily.      . hydrocortisone (ANUSOL-HC) 25 MG suppository Place 1 suppository (25 mg total) rectally at bedtime.  12 suppository  2  . hydrocortisone-pramoxine (ANALPRAM-HC) 2.5-1 % rectal cream Place 1 application rectally 3 (three) times daily.  30 g  2  . sitaGLIPtin (JANUVIA) 100 MG tablet Take 1 tablet (100 mg total) by mouth daily.  30 tablet  6   No current facility-administered medications for this visit.    SURGICAL  HISTORY:  Past Surgical History  Procedure Laterality Date  . Appendectomy  02/21/1961  . Exploratory laparotomy  02/21/1970    for ? adhesions  . Abdominal hysterectomy  02/21/1970    right ovary remaining  . Bone marrow biopsy  03/09/2012  . Lung core biopsy  02/06/2012    REVIEW OF SYSTEMS:  A comprehensive review of systems was negative except for: Constitutional: positive for fatigue   PHYSICAL EXAMINATION: General appearance: alert, cooperative and no distress Head: Normocephalic, without obvious abnormality, atraumatic Neck: no adenopathy Lymph nodes: Cervical, supraclavicular, and axillary nodes normal. Resp: clear to auscultation bilaterally Cardio: regular rate and rhythm, S1, S2 normal, no murmur, click, rub or gallop GI: soft, non-tender; bowel sounds normal; no masses,  no organomegaly Extremities: extremities normal, atraumatic, no cyanosis or edema Skin exam: Several small mobile subcutaneous nodule under the skin.  ECOG PERFORMANCE STATUS: 0 - Asymptomatic  Blood pressure 143/65, pulse 67, temperature 97.8 F (36.6 C), temperature source Oral, resp. rate 19, height 5' 1"  (1.549 m), weight 129 lb 12.8 oz (58.877 kg).  LABORATORY DATA: Lab Results  Component Value Date   WBC 7.1 10/07/2013   HGB 14.1 10/07/2013   HCT 42.7 10/07/2013   MCV 93.0 10/07/2013   PLT 270 10/07/2013      Chemistry      Component Value Date/Time   NA 141 10/07/2013 0917   NA 137 08/12/2013 1216   K 4.9 10/07/2013 0917   K 4.3 08/12/2013 1216   CL 100 08/12/2013 1216   CL 103 06/19/2012 0931   CO2 28 10/07/2013 0917   CO2 31 08/12/2013 1216   BUN 13.1 10/07/2013 0917   BUN 14 08/12/2013 1216   CREATININE 0.8 10/07/2013 0917   CREATININE 0.9 08/12/2013 1216   CREATININE 0.73 01/16/2012 1633      Component Value Date/Time   CALCIUM 10.0 10/07/2013 0917   CALCIUM 9.4 08/12/2013 1216   ALKPHOS 76 10/07/2013 0917   ALKPHOS 70 08/12/2013 1216   AST 13 10/07/2013 0917   AST 16 08/12/2013 1216    ALT 11 10/07/2013 0917   ALT 10 08/12/2013 1216   BILITOT 0.89 10/07/2013 0917   BILITOT 0.9 08/12/2013 1216       RADIOGRAPHIC STUDIES: Ct Chest W Contrast  10/07/2013   CLINICAL DATA:  Lung cancer followup. History of non-Hodgkin's lymphoma.  EXAM: CT CHEST WITH CONTRAST  TECHNIQUE: Multidetector CT imaging of the chest was performed during intravenous contrast administration.  CONTRAST:  70m OMNIPAQUE IOHEXOL 300 MG/ML  SOLN  COMPARISON:  None.  FINDINGS: No pleural effusion identified. Mild changes of centrilobular emphysema noted. Subpleural interstitial coarsening is identified within the lingula and  left lower lobe. This is similar to previous exam and is favored to represent changes from external beam radiation, image 39/series 5. The right upper lobe pulmonary nodule is unchanged measuring 4 mm, image 21/series 5. No new or enlarging pulmonary nodules identified.  The heart size is normal. No pericardial effusion. Calcified atherosclerotic disease involves the thoracic aorta as well as the LAD coronary artery. There is no mediastinal or hilar adenopathy. The esophagus appears within normal limits. No enlarged axillary or supraclavicular adenopathy.  Incidental imaging through the upper abdomen is unremarkable.  Review of the visualized osseous structures is negative for aggressive lytic or sclerotic bone lesions.  IMPRESSION: 1. Stable CT of the chest. No suspicious findings or evidence of metastatic disease. 2. Similar appearance of subpleural interstitial reticulation and scarring within the lingula and left lower lobe which likely reflects changes of external beam radiation. 3. Emphysema 4. Stable right upper lobe nodule 5. Atherosclerotic disease.   Electronically Signed   By: Kerby Moors M.D.   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 Sondra Come. The patient has no evidence for disease progression on his recent scan. I discussed the scan results with the patient. I recommended for her to continue on observation with repeat CT scan of the chest in 6 months. 2) COPD: Continue current treatment with albuterol inhaler. 3) hypertension: Currently on Cozaar and Toprol-XL. 4) diabetes mellitus: She is currently on treatment with glipizide and Januvia. 5) subcutaneous nodule: Unclear etiology. I recommended for the patient to see her dermatologist for consideration of biopsy.  She was advised to call immediately if she has any concerning symptoms in the interval.  All questions were answered. The patient knows to call the clinic with any problems, questions or concerns. We can certainly see the patient much sooner if necessary.

## 2013-10-15 ENCOUNTER — Telehealth: Payer: Self-pay | Admitting: Internal Medicine

## 2013-10-15 NOTE — Telephone Encounter (Signed)
s.w. pt and advised on Feb 2016 appt ....pt ok and aware °

## 2013-10-17 NOTE — Telephone Encounter (Signed)
Patient is having spinal surgery on 9/15 and needs to be seen prior for surgical clearance. Please call her @ [phone]

## 2013-10-25 NOTE — Progress Notes (Signed)
Cardiac Electrophysiology Office Note     Subjective:      Amy Galloway is a 78 yo patient who is seen for back surgery preop.  She had kyphoplasty but still too much pain and she is about to get laminectomy with fusion of lumbar spine with Dr Almira Bar at [facility]  In the past she had abnormal ECG that was obtained at [facility] before her left left wrist surgery.  She said the anesthesiologist wanted to cancel because it showed atrial fibrillation.  The ECG appears to have regular RR intervals and the "fibrillation waves" appear to be possible artifacts to me.  She said she has had palpitations before and had seen me in the past for evaluation of PACs and PVCs when she was kindly referred by Dr Salvadore Farber. She reports she was diagnosised with PSVT 8 years ago, after having a stress test. She has been taking propanol for this for 8 years  and it was controlling the PSVT.  No lightheadedness and dizziness.    No associated SOB or chest pain.     She has a past medical history of IBS, clotting disorder, hypertension, back surgery.   She had a cardiac cath at [facility] 8 years, reported mild blockages that were not bad enough to stents.     Echo 10/03/12 (done at [facility] in [place])  LVEF 60% NO RWA.   Trivial MR and TR.   Holter (48 hour) 10/03/12: Avg HR 71 BPM- NSR. 2 PVCs and 21 PACs. NO sustained arrhythmias.     Family hx: mother CVA 70's/colan cancer, father hx prostate cancer, Oldest brother MI 62 yo. Sister CHF in 64yo. Brother youngest- cardiomyopathy, viral etiology . Paternal and maternal grandfather Mi in 68's.  No hx ppm   Social hx: Quit 2 years ago, tobacco use x 50 years. Very occasional. Married. 3 children. Active but does not exercise, arthritis in both hips . 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         Patient Active Problem List    Diagnosis Date Noted   ??? Preop cardiovascular exam 10/25/2013   ??? PVC (premature ventricular contraction) 10/24/2012    ??? PAC (premature atrial contraction) 10/24/2012   ??? Palpitation 10/24/2012     Current Outpatient Prescriptions   Medication Sig Dispense Refill   ??? VIT A/VIT C/VIT E/ZINC/COPPER (PRESERVISION AREDS PO) Take 1 Tab by mouth two (2) times a day.     ??? traMADol 50 mg TbDL Take  by mouth.     ??? pantoprazole (PROTONIX) 20 mg tablet Take 20 mg by mouth daily.     ??? losartan (COZAAR) 25 mg tablet Take  by mouth daily.     ??? B INFANTIS/B ANI/B LON/B BIFID (PROBIOTIC 4X PO) Take  by mouth.     ??? aspirin (ASPIRIN) 325 mg tablet Take 325 mg by mouth daily.     ??? levothyroxine (LEVOXYL) 150 mcg tablet Take 150 mcg by mouth Daily (before breakfast).       ??? propranolol (INDERAL) 20 mg tablet Take 20 mg by mouth three (3) times daily.       ??? LORazepam (ATIVAN) 0.5 mg tablet Take 0.5 mg by mouth two (2) times a day.       ??? citalopram (CELEXA) 20 mg tablet Take 20 mg by mouth daily.       ??? buPROPion SR (WELLBUTRIN SR) 150 mg SR tablet Take 150 mg by mouth two (2) times a day.       ???  ergocalciferol (VITAMIN D2) 50,000 unit capsule Take 50,000 Units by mouth. Twice a month      ??? fluticasone (FLONASE) 50 mcg/actuation nasal spray 2 Sprays by Both Nostrils route daily.       ??? DOCUSATE SODIUM (COLACE PO) Take 250 mg by mouth two (2) times a day.       ??? PYRIDOXINE HCL (VITAMIN B-6 PO) Take 500 mg by mouth daily.       ??? cyanocobalamin (VITAMIN B-12) 1,000 mcg tablet Take 1,000 mcg by mouth daily.       ??? CALCIUM CITRATE-VITAMIN D3 PO Take 2 Caps by mouth daily.       ??? iron polysaccharides (NU-IRON) 150 mg capsule Take 150 mg by mouth daily.       ??? Cholecalciferol, Vitamin D3, (VITAMIN D3) 1,000 unit Cap Take 1 Tab by mouth daily.       ??? acetaminophen 650 mg Tab Take 1 Tab by mouth as needed.         No Known Allergies  Past Medical History   Diagnosis Date   ??? Thyroid disease    ??? Psychiatric disorder      depression   ??? Arrhythmia      PSVT   ??? Hypertension    ??? Other ill-defined conditions(799.89)      TMJ    ??? Other ill-defined conditions(799.89)      vitamin D deficiency   ??? Other ill-defined conditions(799.89)      insomnia   ??? Other ill-defined conditions(799.89)      carpel tunnel syndrome   ??? Other ill-defined conditions(799.89)      iron deficiency   ??? Cancer (HCC) 1979     breast cancer/ chemotherapy (1980-81)   ??? Compression fx, lumbar spine (HCC)    ??? Breast CA (HCC)    ??? HX OTHER MEDICAL      chemotherapy     Past Surgical History   Procedure Laterality Date   ??? Hx dilation and curettage  1975     excessive bleeding   ??? Hx hysterectomy  1976     fibroid tumor causing excessive bleeding   ??? Hx gi  1978     hemorrhoidectomy and repair of anal ulcer   ??? Hx orthopaedic  1995     right carpel tunnel surgery   ??? Hx orthopaedic  2005     arthroscopic right shoulder surgery - torn rotator cuff   ??? Hx orthopaedic  2010, 2011,2012     spinal steroid injection L4, L5- spinal stenosis   ??? Pr cardiac surg procedure unlist  2007     heart cath due to rapid heart rate   ??? Hx gastric bypass  2000, 2001     initial/repair   ??? Hx heent       macular degeneration   ??? Hx heent  2006     laser surgery to repair left torn retina    ??? Hx other surgical       Surgery L4-L5   ??? Pr breast surgery procedure unlisted  1979     left modified radical mastectomy - breast cancer   ??? Pr breast surgery procedure unlisted  1982     reconstruction       History   Substance Use Topics   ??? Smoking status: Former Smoker   ??? Smokeless tobacco: Not on file   ??? Alcohol Use: Yes      Comment: socially  Review of Systems:   Constitutional: Negative for fever, chills, weight loss  HEENT: Negative for nosebleeds, vision changes.   Respiratory: Negative for cough, hemoptysis, sputum production, and wheezing.   Cardiovascular: Negative for chest pain no orthopnea, claudication, leg swelling, syncope, and PND.   Gastrointestinal: Negative for nausea, vomiting, diarrhea, constipation, blood in stool and melena.    Genitourinary: Negative for dysuria, and hematuria.   Musculoskeletal: Negative for myalgias, +arthralgia of back and is wearing a brace  Skin: Negative for rash.   Heme: Does not bleed or bruise easily.   Neurological: Negative for speech change and focal weakness     Objective:     BP 110/60 mmHg   Pulse 70   Resp 16   Ht 5' 3.5" (1.613 m)   Wt 202 lb (91.627 kg)   BMI 35.22 kg/m2   SpO2 96%   Physical Exam:   Constitutional: well-developed and well-nourished. No distress.   Head: Normocephalic and atraumatic.   Eyes: Pupils are equal, round  Neck: supple. No JVD present.   Cardiovascular: Normal rate, regular rhythm and normal heart sounds.  Exam reveals no gallop and no friction rub.  murmur heard.  Pulmonary/Chest: Effort normal and breath sounds normal. No wheezes.   Abdominal: Soft, no tenderness.   Musculoskeletal: no edema.   Neurological: alert,oriented.   Skin: Skin is warm and dry  Psychiatric: normal mood and affect. Behavior is normal. Judgment and thought content normal.      EKG from preop: sinus rhythm with artifacts, normal ECG      Assessment/Plan:       ICD-9-CM ICD-10-CM    1. Preop cardiovascular exam V72.81 Z01.810    2. PAC (premature atrial contraction) 427.61 I49.1    3. PVC (premature ventricular contraction) 427.69 I49.3      She is at low risk for perioperative MI for back surgery.  She has no symptoms so I do not recommend stress test  If she does have PAF later I would recommend anticoagulant instead of aspirin but for now she may hold aspirin for surgery 5-7 days  I filled out H/P form for Dr Almira Bar.  He sent over with her  ECG is filled out and faxed  Follow-up Disposition:  Return in about 6 months (around 04/25/2014).    Thank you for involving me in this patient's care and please call with further concerns or questions.      Juliet Rude, M.D.  Electrophysiology/Cardiology  [facility]   [address]                      [address] [place], [place] [address]                             [place] [address]  [phone]                                        [phone]

## 2013-10-25 NOTE — Patient Instructions (Signed)
Follow up in 6 months.

## 2013-10-27 ENCOUNTER — Encounter (HOSPITAL_COMMUNITY): Payer: Self-pay | Admitting: Emergency Medicine

## 2013-10-27 ENCOUNTER — Emergency Department (HOSPITAL_COMMUNITY)
Admission: EM | Admit: 2013-10-27 | Discharge: 2013-10-27 | Disposition: A | Discharge status: Home / Self Care | Payer: Medicare HMO | Attending: Emergency Medicine | Admitting: Emergency Medicine

## 2013-10-27 ENCOUNTER — Observation Stay (HOSPITAL_COMMUNITY)
Admission: EM | Admit: 2013-10-27 | Discharge: 2013-10-28 | Disposition: A | Discharge status: Home / Self Care | Payer: Commercial Managed Care - HMO | Attending: Family Medicine | Admitting: Family Medicine

## 2013-10-27 ENCOUNTER — Emergency Department (HOSPITAL_COMMUNITY): Payer: Medicare HMO

## 2013-10-27 ENCOUNTER — Emergency Department (HOSPITAL_COMMUNITY): Payer: Commercial Managed Care - HMO

## 2013-10-27 DIAGNOSIS — Z7982 Long term (current) use of aspirin: Secondary | ICD-10-CM | POA: Insufficient documentation

## 2013-10-27 DIAGNOSIS — M549 Dorsalgia, unspecified: Secondary | ICD-10-CM | POA: Insufficient documentation

## 2013-10-27 DIAGNOSIS — Z79899 Other long term (current) drug therapy: Secondary | ICD-10-CM | POA: Diagnosis not present

## 2013-10-27 DIAGNOSIS — Z87891 Personal history of nicotine dependence: Secondary | ICD-10-CM | POA: Insufficient documentation

## 2013-10-27 DIAGNOSIS — Z8719 Personal history of other diseases of the digestive system: Secondary | ICD-10-CM | POA: Diagnosis not present

## 2013-10-27 DIAGNOSIS — Z860100 Personal history of colon polyps, unspecified: Secondary | ICD-10-CM | POA: Insufficient documentation

## 2013-10-27 DIAGNOSIS — J441 Chronic obstructive pulmonary disease with (acute) exacerbation: Secondary | ICD-10-CM | POA: Diagnosis not present

## 2013-10-27 DIAGNOSIS — K5900 Constipation, unspecified: Secondary | ICD-10-CM | POA: Diagnosis not present

## 2013-10-27 DIAGNOSIS — Z87442 Personal history of urinary calculi: Secondary | ICD-10-CM | POA: Insufficient documentation

## 2013-10-27 DIAGNOSIS — I10 Essential (primary) hypertension: Secondary | ICD-10-CM | POA: Insufficient documentation

## 2013-10-27 DIAGNOSIS — Z923 Personal history of irradiation: Secondary | ICD-10-CM | POA: Insufficient documentation

## 2013-10-27 DIAGNOSIS — E119 Type 2 diabetes mellitus without complications: Secondary | ICD-10-CM | POA: Diagnosis not present

## 2013-10-27 DIAGNOSIS — R0902 Hypoxemia: Secondary | ICD-10-CM | POA: Diagnosis not present

## 2013-10-27 DIAGNOSIS — Z85118 Personal history of other malignant neoplasm of bronchus and lung: Secondary | ICD-10-CM | POA: Diagnosis not present

## 2013-10-27 DIAGNOSIS — Z791 Long term (current) use of non-steroidal anti-inflammatories (NSAID): Secondary | ICD-10-CM | POA: Diagnosis not present

## 2013-10-27 DIAGNOSIS — M1990 Unspecified osteoarthritis, unspecified site: Secondary | ICD-10-CM | POA: Insufficient documentation

## 2013-10-27 DIAGNOSIS — R0602 Shortness of breath: Secondary | ICD-10-CM | POA: Diagnosis present

## 2013-10-27 DIAGNOSIS — R110 Nausea: Secondary | ICD-10-CM | POA: Insufficient documentation

## 2013-10-27 DIAGNOSIS — Z9089 Acquired absence of other organs: Secondary | ICD-10-CM | POA: Diagnosis not present

## 2013-10-27 DIAGNOSIS — Z8571 Personal history of Hodgkin lymphoma: Secondary | ICD-10-CM | POA: Insufficient documentation

## 2013-10-27 DIAGNOSIS — Z8781 Personal history of (healed) traumatic fracture: Secondary | ICD-10-CM | POA: Insufficient documentation

## 2013-10-27 DIAGNOSIS — Z792 Long term (current) use of antibiotics: Secondary | ICD-10-CM | POA: Diagnosis not present

## 2013-10-27 DIAGNOSIS — Z90710 Acquired absence of both cervix and uterus: Secondary | ICD-10-CM | POA: Diagnosis not present

## 2013-10-27 DIAGNOSIS — N390 Urinary tract infection, site not specified: Secondary | ICD-10-CM | POA: Diagnosis not present

## 2013-10-27 DIAGNOSIS — K5730 Diverticulosis of large intestine without perforation or abscess without bleeding: Secondary | ICD-10-CM | POA: Diagnosis not present

## 2013-10-27 DIAGNOSIS — Z872 Personal history of diseases of the skin and subcutaneous tissue: Secondary | ICD-10-CM | POA: Diagnosis not present

## 2013-10-27 DIAGNOSIS — J9601 Acute respiratory failure with hypoxia: Secondary | ICD-10-CM | POA: Diagnosis present

## 2013-10-27 DIAGNOSIS — Z8742 Personal history of other diseases of the female genital tract: Secondary | ICD-10-CM | POA: Diagnosis not present

## 2013-10-27 DIAGNOSIS — F411 Generalized anxiety disorder: Secondary | ICD-10-CM | POA: Diagnosis not present

## 2013-10-27 DIAGNOSIS — J9600 Acute respiratory failure, unspecified whether with hypoxia or hypercapnia: Secondary | ICD-10-CM

## 2013-10-27 DIAGNOSIS — E785 Hyperlipidemia, unspecified: Secondary | ICD-10-CM | POA: Diagnosis not present

## 2013-10-27 DIAGNOSIS — C8590 Non-Hodgkin lymphoma, unspecified, unspecified site: Secondary | ICD-10-CM | POA: Diagnosis present

## 2013-10-27 DIAGNOSIS — R1084 Generalized abdominal pain: Secondary | ICD-10-CM | POA: Diagnosis not present

## 2013-10-27 DIAGNOSIS — R109 Unspecified abdominal pain: Secondary | ICD-10-CM | POA: Insufficient documentation

## 2013-10-27 LAB — COMPREHENSIVE METABOLIC PANEL
ALT: 10 U/L (ref 0–35)
AST: 13 U/L (ref 0–37)
Albumin: 3.9 g/dL (ref 3.5–5.2)
Alkaline Phosphatase: 86 U/L (ref 39–117)
Anion gap: 12 (ref 5–15)
BUN: 15 mg/dL (ref 6–23)
CALCIUM: 9.4 mg/dL (ref 8.4–10.5)
CO2: 26 meq/L (ref 19–32)
CREATININE: 0.79 mg/dL (ref 0.50–1.10)
Chloride: 103 mEq/L (ref 96–112)
GFR calc Af Amer: >90 mL/min (ref >90)
GFR, EST NON AFRICAN AMERICAN: 79 mL/min — AB (ref >90)
GLUCOSE: 217 mg/dL — AB (ref 70–99)
Potassium: 4 mEq/L (ref 3.7–5.3)
Sodium: 141 mEq/L (ref 137–147)
Total Bilirubin: 0.6 mg/dL (ref 0.3–1.2)
Total Protein: 7.1 g/dL (ref 6.0–8.3)

## 2013-10-27 LAB — URINALYSIS, ROUTINE W REFLEX MICROSCOPIC
Bilirubin Urine: NEGATIVE
Glucose, UA: NEGATIVE mg/dL
Ketones, ur: NEGATIVE mg/dL
NITRITE: NEGATIVE
Protein, ur: NEGATIVE mg/dL
SPECIFIC GRAVITY, URINE: 1.01 (ref 1.005–1.030)
Urobilinogen, UA: 0.2 mg/dL (ref 0.0–1.0)
pH: 6 (ref 5.0–8.0)

## 2013-10-27 LAB — URINE MICROSCOPIC-ADD ON

## 2013-10-27 LAB — BASIC METABOLIC PANEL
Anion gap: 10 (ref 5–15)
BUN: 13 mg/dL (ref 6–23)
CO2: 28 mEq/L (ref 19–32)
CREATININE: 0.8 mg/dL (ref 0.50–1.10)
Calcium: 8.7 mg/dL (ref 8.4–10.5)
Chloride: 99 mEq/L (ref 96–112)
GFR calc Af Amer: 81 mL/min — ABNORMAL LOW (ref >90)
GFR calc non Af Amer: 70 mL/min — ABNORMAL LOW (ref >90)
GLUCOSE: 247 mg/dL — AB (ref 70–99)
POTASSIUM: 4.5 meq/L (ref 3.7–5.3)
Sodium: 137 mEq/L (ref 137–147)

## 2013-10-27 LAB — CBC WITH DIFFERENTIAL/PLATELET
Basophils Absolute: 0 10*3/uL (ref 0.0–0.1)
Basophils Absolute: 0 10*3/uL (ref 0.0–0.1)
Basophils Relative: 0 % (ref 0–1)
Basophils Relative: 0 % (ref 0–1)
EOS PCT: 2 % (ref 0–5)
Eosinophils Absolute: 0.2 10*3/uL (ref 0.0–0.7)
Eosinophils Absolute: 0.1 10*3/uL (ref 0.0–0.7)
Eosinophils Relative: 1 % (ref 0–5)
HCT: 41.8 % (ref 36.0–46.0)
HEMATOCRIT: 42.9 % (ref 36.0–46.0)
HEMOGLOBIN: 14.8 g/dL (ref 12.0–15.0)
Hemoglobin: 14.2 g/dL (ref 12.0–15.0)
LYMPHS ABS: 1.7 10*3/uL (ref 0.7–4.0)
LYMPHS ABS: 1.6 10*3/uL (ref 0.7–4.0)
LYMPHS PCT: 20 % (ref 12–46)
Lymphocytes Relative: 19 % (ref 12–46)
MCH: 31.4 pg (ref 26.0–34.0)
MCH: 31.3 pg (ref 26.0–34.0)
MCHC: 34.5 g/dL (ref 30.0–36.0)
MCHC: 34 g/dL (ref 30.0–36.0)
MCV: 90.9 fL (ref 78.0–100.0)
MCV: 92.3 fL (ref 78.0–100.0)
MONO ABS: 0.9 10*3/uL (ref 0.1–1.0)
MONO ABS: 0.9 10*3/uL (ref 0.1–1.0)
MONOS PCT: 11 % (ref 3–12)
Monocytes Relative: 10 % (ref 3–12)
NEUTROS ABS: 5.7 10*3/uL (ref 1.7–7.7)
NEUTROS ABS: 6.1 10*3/uL (ref 1.7–7.7)
NEUTROS PCT: 70 % (ref 43–77)
Neutrophils Relative %: 67 % (ref 43–77)
Platelets: 284 10*3/uL (ref 150–400)
Platelets: 272 10*3/uL (ref 150–400)
RBC: 4.72 MIL/uL (ref 3.87–5.11)
RBC: 4.53 MIL/uL (ref 3.87–5.11)
RDW: 13.2 % (ref 11.5–15.5)
RDW: 13.3 % (ref 11.5–15.5)
WBC: 8.5 10*3/uL (ref 4.0–10.5)
WBC: 8.7 10*3/uL (ref 4.0–10.5)

## 2013-10-27 LAB — TROPONIN I: Troponin I: <0.3 ng/mL (ref <0.30)

## 2013-10-27 LAB — GLUCOSE, CAPILLARY: Glucose-Capillary: 194 mg/dL — ABNORMAL HIGH (ref 70–99)

## 2013-10-27 LAB — CBG MONITORING, ED: Glucose-Capillary: 187 mg/dL — ABNORMAL HIGH (ref 70–99)

## 2013-10-27 LAB — LIPASE, BLOOD: LIPASE: 17 U/L (ref 11–59)

## 2013-10-27 MED ORDER — CEPHALEXIN 500 MG PO CAPS: 500.0000 mg | ORAL_CAPSULE | Freq: Four times a day (QID) | ORAL | Status: DC

## 2013-10-27 MED ORDER — HYDROCODONE-ACETAMINOPHEN 5-325 MG PO TABS
2.0000 | ORAL_TABLET | Freq: Once | ORAL | Status: AC
  Administered 2013-10-27: 2 via ORAL
  Filled 2013-10-27: qty 2

## 2013-10-27 MED ORDER — SODIUM CHLORIDE 0.9 % IV BOLUS (SEPSIS)
1000.0000 mL | Freq: Once | INTRAVENOUS | Status: AC
  Administered 2013-10-27: 1000 mL via INTRAVENOUS

## 2013-10-27 MED ORDER — PREDNISONE 50 MG PO TABS
60.0000 mg | ORAL_TABLET | Freq: Once | ORAL | Status: AC
  Administered 2013-10-27: 60 mg via ORAL
  Filled 2013-10-27 (×2): qty 1

## 2013-10-27 MED ORDER — SODIUM CHLORIDE 0.9 % IV SOLN
Freq: Once | INTRAVENOUS | Status: AC
  Administered 2013-10-27: 10:00:00 via INTRAVENOUS

## 2013-10-27 MED ORDER — IPRATROPIUM-ALBUTEROL 0.5-2.5 (3) MG/3ML IN SOLN
3.0000 mL | RESPIRATORY_TRACT | Status: DC
  Administered 2013-10-27: 3 mL via RESPIRATORY_TRACT
  Filled 2013-10-27: qty 3

## 2013-10-27 MED ORDER — ONDANSETRON HCL 4 MG/2ML IJ SOLN
4.0000 mg | Freq: Once | INTRAMUSCULAR | Status: AC
  Administered 2013-10-27: 4 mg via INTRAVENOUS
  Filled 2013-10-27: qty 2

## 2013-10-27 MED ORDER — TRAMADOL HCL 50 MG PO TABS
50.0000 mg | ORAL_TABLET | Freq: Four times a day (QID) | ORAL | Status: DC | PRN
  Administered 2013-10-28: 50 mg via ORAL
  Filled 2013-10-27: qty 1

## 2013-10-27 MED ORDER — SODIUM CHLORIDE 0.9 % IV SOLN
INTRAVENOUS | Status: DC
  Administered 2013-10-27 – 2013-10-28 (×2): via INTRAVENOUS

## 2013-10-27 MED ORDER — INSULIN ASPART 100 UNIT/ML ~~LOC~~ SOLN: 0.0000 [IU] | Freq: Every day | SUBCUTANEOUS | Status: DC

## 2013-10-27 MED ORDER — METHYLPREDNISOLONE SODIUM SUCC 125 MG IJ SOLR: 60.0000 mg | Freq: Four times a day (QID) | INTRAMUSCULAR | Status: DC

## 2013-10-27 MED ORDER — TRAMADOL HCL 50 MG PO TABS: 50.0000 mg | ORAL_TABLET | Freq: Four times a day (QID) | ORAL | Status: DC | PRN

## 2013-10-27 MED ORDER — IOHEXOL 300 MG/ML  SOLN
100.0000 mL | Freq: Once | INTRAMUSCULAR | Status: AC | PRN
  Administered 2013-10-27: 100 mL via INTRAVENOUS

## 2013-10-27 MED ORDER — GUAIFENESIN ER 600 MG PO TB12
1200.0000 mg | ORAL_TABLET | Freq: Two times a day (BID) | ORAL | Status: DC
  Administered 2013-10-27 – 2013-10-28 (×2): 1200 mg via ORAL
  Filled 2013-10-27 (×6): qty 2

## 2013-10-27 MED ORDER — ONDANSETRON HCL 4 MG PO TABS: 4.0000 mg | ORAL_TABLET | Freq: Three times a day (TID) | ORAL | Status: DC | PRN

## 2013-10-27 MED ORDER — ONDANSETRON HCL 4 MG/2ML IJ SOLN: 4.0000 mg | Freq: Once | INTRAMUSCULAR | Status: DC

## 2013-10-27 MED ORDER — INSULIN ASPART 100 UNIT/ML ~~LOC~~ SOLN
0.0000 [IU] | Freq: Three times a day (TID) | SUBCUTANEOUS | Status: DC
  Administered 2013-10-28: 8 [IU] via SUBCUTANEOUS
  Administered 2013-10-28: 5 [IU] via SUBCUTANEOUS

## 2013-10-27 MED ORDER — IPRATROPIUM-ALBUTEROL 0.5-2.5 (3) MG/3ML IN SOLN
3.0000 mL | Freq: Four times a day (QID) | RESPIRATORY_TRACT | Status: DC
  Administered 2013-10-28: 3 mL via RESPIRATORY_TRACT
  Filled 2013-10-27: qty 3

## 2013-10-27 MED ORDER — METOPROLOL SUCCINATE ER 50 MG PO TB24
100.0000 mg | ORAL_TABLET | Freq: Every morning | ORAL | Status: DC
  Administered 2013-10-28: 100 mg via ORAL
  Filled 2013-10-27: qty 2
  Filled 2013-10-27 (×2): qty 1

## 2013-10-27 MED ORDER — ALPRAZOLAM 0.5 MG PO TABS
0.5000 mg | ORAL_TABLET | Freq: Every day | ORAL | Status: DC
  Administered 2013-10-27: 0.5 mg via ORAL
  Filled 2013-10-27: qty 1

## 2013-10-27 MED ORDER — NAPROXEN 500 MG PO TABS: 500.0000 mg | ORAL_TABLET | Freq: Two times a day (BID) | ORAL | Status: DC

## 2013-10-27 MED ORDER — DEXTROSE 5 % IV SOLN
1.0000 g | Freq: Once | INTRAVENOUS | Status: AC
  Administered 2013-10-27: 1 g via INTRAVENOUS
  Filled 2013-10-27: qty 10

## 2013-10-27 MED ORDER — ACETAMINOPHEN 650 MG RE SUPP: 650.0000 mg | Freq: Four times a day (QID) | RECTAL | Status: DC | PRN

## 2013-10-27 MED ORDER — LOSARTAN POTASSIUM 50 MG PO TABS
50.0000 mg | ORAL_TABLET | Freq: Every morning | ORAL | Status: DC
  Administered 2013-10-28: 50 mg via ORAL
  Filled 2013-10-27 (×3): qty 1

## 2013-10-27 MED ORDER — ONDANSETRON HCL 4 MG PO TABS
4.0000 mg | ORAL_TABLET | Freq: Four times a day (QID) | ORAL | Status: DC | PRN
  Administered 2013-10-28 (×2): 4 mg via ORAL
  Filled 2013-10-27: qty 1

## 2013-10-27 MED ORDER — IPRATROPIUM BROMIDE 0.02 % IN SOLN: 0.5000 mg | Freq: Four times a day (QID) | RESPIRATORY_TRACT | Status: DC

## 2013-10-27 MED ORDER — IOHEXOL 300 MG/ML  SOLN
50.0000 mL | Freq: Once | INTRAMUSCULAR | Status: AC | PRN
  Administered 2013-10-27: 50 mL via ORAL

## 2013-10-27 MED ORDER — ACETAMINOPHEN 325 MG PO TABS
650.0000 mg | ORAL_TABLET | Freq: Four times a day (QID) | ORAL | Status: DC | PRN
Start: 2013-10-27 — End: 2013-10-28
  Administered 2013-10-28: 650 mg via ORAL
  Filled 2013-10-27: qty 2

## 2013-10-27 MED ORDER — ALBUTEROL SULFATE (2.5 MG/3ML) 0.083% IN NEBU: 2.5000 mg | INHALATION_SOLUTION | RESPIRATORY_TRACT | Status: DC | PRN

## 2013-10-27 MED ORDER — CITALOPRAM HYDROBROMIDE 20 MG PO TABS
40.0000 mg | ORAL_TABLET | Freq: Every evening | ORAL | Status: DC
  Administered 2013-10-27: 40 mg via ORAL
  Filled 2013-10-27: qty 1
  Filled 2013-10-27: qty 2
  Filled 2013-10-27: qty 1

## 2013-10-27 MED ORDER — ONDANSETRON HCL 4 MG/2ML IJ SOLN
4.0000 mg | Freq: Four times a day (QID) | INTRAMUSCULAR | Status: DC | PRN
  Administered 2013-10-27: 4 mg via INTRAVENOUS
  Filled 2013-10-27 (×2): qty 2

## 2013-10-27 MED ORDER — ASPIRIN EC 81 MG PO TBEC
81.0000 mg | DELAYED_RELEASE_TABLET | Freq: Every morning | ORAL | Status: DC
  Administered 2013-10-28: 81 mg via ORAL
  Filled 2013-10-27: qty 1

## 2013-10-27 MED ORDER — MORPHINE SULFATE 4 MG/ML IJ SOLN
4.0000 mg | Freq: Once | INTRAMUSCULAR | Status: AC
  Administered 2013-10-27: 4 mg via INTRAVENOUS
  Filled 2013-10-27: qty 1

## 2013-10-27 MED ORDER — METHYLPREDNISOLONE SODIUM SUCC 125 MG IJ SOLR
60.0000 mg | Freq: Four times a day (QID) | INTRAMUSCULAR | Status: DC
  Administered 2013-10-28 (×3): 60 mg via INTRAVENOUS
  Filled 2013-10-27 (×3): qty 2

## 2013-10-27 MED ORDER — DEXTROSE 5 % IV SOLN
1.0000 g | INTRAVENOUS | Status: DC
  Administered 2013-10-28: 1 g via INTRAVENOUS
  Filled 2013-10-27 (×2): qty 10

## 2013-10-27 MED ORDER — DEXTROSE 5 % IV SOLN: 1.0000 g | INTRAVENOUS | Status: DC

## 2013-10-27 MED ORDER — ENOXAPARIN SODIUM 40 MG/0.4ML ~~LOC~~ SOLN
40.0000 mg | SUBCUTANEOUS | Status: DC
  Administered 2013-10-27: 40 mg via SUBCUTANEOUS
  Filled 2013-10-27: qty 0.4

## 2013-10-27 MED ORDER — VITAMIN B-12 1000 MCG PO TABS
500.0000 ug | ORAL_TABLET | Freq: Every day | ORAL | Status: DC
  Administered 2013-10-28: 500 ug via ORAL
  Filled 2013-10-27: qty 1
  Filled 2013-10-27 (×3): qty 2

## 2013-10-27 MED ORDER — HYDROMORPHONE HCL PF 1 MG/ML IJ SOLN: 0.5000 mg | Freq: Once | INTRAMUSCULAR | Status: DC

## 2013-10-27 MED ORDER — ALBUTEROL SULFATE (2.5 MG/3ML) 0.083% IN NEBU: 2.5000 mg | INHALATION_SOLUTION | Freq: Four times a day (QID) | RESPIRATORY_TRACT | Status: DC

## 2013-10-27 NOTE — ED Notes (Addendum)
Hospitalist in with pt at this time; pt assisted to bathroom and back to bed. Pt is requesting something to eat, Dr. Stevie Kern informed and orders given for pt to have something to eat. Report given to [facility] RN on 300.

## 2013-10-27 NOTE — ED Notes (Signed)
Lower abdominal and back pain times one week.  Pain getting worse.  Nausea.

## 2013-10-27 NOTE — H&P (Signed)
[facility] History and Physical  Marie Holmes DOB: [birth year] DOA: 10/27/2013  Referring physician: Dr. Stevie Kern, ER physician PCP: Arnette Norris, MD   Chief Complaint: abdominal pain  HPI: Marie Holmes is a 78 y.o. female who reports experiencing lower abdominal pain, nausea and dysuria for the last 1 week. Symptoms had progressively gotten worse.  She says that she may have felt feverish, but did have episodes of diaphoresis and chills. She has also had low back pain which is bilateral. She was evaluated in the emergency room earlier today where CT abdomen was done and did not show any acute findings. She was started on antibiotics for UTI and discharged home.  After returning home, she reports feeling not better and returned to the emergency room the same day.  During this visit, she was noted to be wheezing, which she reports is chronic and was also noted to be hypoxic on room air on ambulation into the 80's.  She reports a chronic cough and difficulty expectorating sputum. She is being admitted for COPD exacerbation.   Review of Systems:  Pertinent positives as per HPI, otherwise negative  Past Medical History  Diagnosis Date  . Benign tumor of breast 2004    left  . Broken ribs     history of several broken ribs on right and compression  fx  . Fibroma of skin     Multiple fibromas/lipomas on arms and legs B  . Pyelonephritis   . Anxiety   . COPD (chronic obstructive pulmonary disease)   . Hyperlipidemia   . Osteoarthritis   . History of CT scan of abdomen 05/23/2003    Mild fatty liver inf, tiny hypodensity in right lobe of liver  . Kidney stone 02/21/1981    Right/removed  . Pulmonary nodules   . Diabetes mellitus without complication   . Hx of radiation therapy 04/11/12- 05/08/12    left chest wall area, 40 gray 20 fx  . Hypertension   . Diverticulosis   . Adenomatous colon polyp   . Cancer     non-hodgkins b cell lymphoma  . 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 Appendectomy  02/21/1961  . Exploratory laparotomy  02/21/1970    for ? adhesions  . Abdominal hysterectomy  02/21/1970    right ovary remaining  . Bone marrow biopsy  03/09/2012  . Lung core biopsy  02/06/2012   Social History:  reports that she has quit smoking. Her smoking use included Cigarettes. She has a 15 pack-year smoking history. She has never used smokeless tobacco. She reports that she does not drink alcohol or use illicit drugs.  Allergies  Allergen Reactions  . Sulfonamide Derivatives Anaphylaxis  . Chantix [Varenicline] Other (See Comments)    Made her angry, nausea, made blood pressure go up    Family History  Problem Relation Age of Onset  . Parkinsonism Brother   . Stroke Father   . Cancer Sister   . Heart disease Brother   . Clotting disorder Father   . Kidney disease Mother      Prior to Admission medications   Medication Sig Start Date End Date Taking? Authorizing Provider  albuterol (PROVENTIL HFA;VENTOLIN HFA) 108 (90 BASE) MCG/ACT inhaler Inhale 2 puffs into the lungs every 6 (six) hours as needed for wheezing or shortness of breath.   Yes Historical Provider, MD  alendronate (FOSAMAX) 70 MG tablet  Take 70 mg by mouth every 7 (seven) days. Take with a full glass of water on an empty stomach. Takes on Tuesday 06/03/13  Yes Lucille Passy, MD  ALPRAZolam Duanne Moron) 0.5 MG tablet Take 0.5 mg by mouth at bedtime. 09/05/13  Yes Lucille Passy, MD  aspirin EC 81 MG tablet Take 81 mg by mouth every morning.   Yes Historical Provider, MD  citalopram (CELEXA) 40 MG tablet Take 1 tablet (40 mg total) by mouth every evening.   Yes Tonia Ghent, MD  glipiZIDE (GLUCOTROL) 5 MG tablet Take 1 tablet (5 mg total) by mouth 2 (two) times daily before a meal. 09/30/13  Yes Lucille Passy, MD  ibuprofen (ADVIL,MOTRIN) 200 MG tablet Take 400 mg by mouth every 8  (eight) hours as needed for mild pain. For pain   Yes Historical Provider, MD  losartan (COZAAR) 50 MG tablet Take 1 tablet (50 mg total) by mouth every morning. 06/03/13  Yes Lucille Passy, MD  metoprolol succinate (TOPROL-XL) 100 MG 24 hr tablet Take 1 tablet (100 mg total) by mouth every morning. 07/08/13  Yes Lucille Passy, MD  Multiple Vitamin (MULTIVITAMIN WITH MINERALS) TABS tablet Take 1 tablet by mouth daily.   Yes Historical Provider, MD  vitamin B-12 (CYANOCOBALAMIN) 500 MCG tablet Take 500 mcg by mouth daily.   Yes Historical Provider, MD  cephALEXin (KEFLEX) 500 MG capsule Take 1 capsule (500 mg total) by mouth 4 (four) times daily. 10/27/13   Johnna Acosta, MD  naproxen (NAPROSYN) 500 MG tablet Take 1 tablet (500 mg total) by mouth 2 (two) times daily with a meal. 10/27/13   Johnna Acosta, MD  ondansetron (ZOFRAN) 4 MG tablet Take 1 tablet (4 mg total) by mouth every 8 (eight) hours as needed for nausea or vomiting. 10/27/13   Johnna Acosta, MD  traMADol (ULTRAM) 50 MG tablet Take 1 tablet (50 mg total) by mouth every 6 (six) hours as needed. 10/27/13   Johnna Acosta, MD   Physical Exam: Filed Vitals:   10/27/13 2029 10/27/13 2030 10/27/13 2045 10/27/13 2100  BP:  130/45    Pulse:   73 85  Temp:      TempSrc:      Resp:      Height:      Weight:      SpO2: 100%  98% 98%    Wt Readings from Last 3 Encounters:  10/27/13 58.968 kg (130 lb)  10/27/13 58.968 kg (130 lb)  10/14/13 58.877 kg (129 lb 12.8 oz)    General:  Appears calm and comfortable Eyes: PERRL, normal lids, irises & conjunctiva ENT: grossly normal hearing, lips & tongue Neck: no LAD, masses or thyromegaly Cardiovascular: RRR, no m/r/g. No LE edema. Telemetry: SR, no arrhythmias  Respiratory: diminished breath sounds bilaterally. Normal respiratory effort. Abdomen: soft, tender in periumbilical area and lower abdomen, bs+ Skin: no rash or induration seen on limited exam Musculoskeletal: grossly normal tone  BUE/BLE Psychiatric: grossly normal mood and affect, speech fluent and appropriate Neurologic: grossly non-focal.          Labs on Admission:  Basic Metabolic Panel:  Recent Labs Lab 10/27/13 0955 10/27/13 1822  NA 141 137  K 4.0 4.5  CL 103 99  CO2 26 28  GLUCOSE 217* 247*  BUN 15 13  CREATININE 0.79 0.80  CALCIUM 9.4 8.7   Liver Function Tests:  Recent Labs Lab 10/27/13 0955  AST 13  ALT  10  ALKPHOS 86  BILITOT 0.6  PROT 7.1  ALBUMIN 3.9    Recent Labs Lab 10/27/13 0955  LIPASE 17   No results found for this basename: AMMONIA,  in the last 168 hours CBC:  Recent Labs Lab 10/27/13 0955 10/27/13 1822  WBC 8.5 8.7  NEUTROABS 5.7 6.1  HGB 14.8 14.2  HCT 42.9 41.8  MCV 90.9 92.3  PLT 284 272   Cardiac Enzymes:  Recent Labs Lab 10/27/13 1822  TROPONINI <0.30    BNP (last 3 results)  Recent Labs  11/28/12 1330  PROBNP 302.2   CBG:  Recent Labs Lab 10/27/13 2041  GLUCAP 187*    Radiological Exams on Admission: Dg Chest 2 View  10/27/2013   CLINICAL DATA:  Shortness of breath 2 days. History of lung cancer as well as non-Hodgkin's lymphoma.  EXAM: CHEST  2 VIEW  COMPARISON:  11/28/2012 and chest CT 10/07/2013  FINDINGS: Lungs are adequately inflated without consolidation or effusion. Cardiomediastinal silhouette and remainder of the exam is unchanged.  IMPRESSION: No active cardiopulmonary disease.   Electronically Signed   By: Marin Olp M.D.   On: 10/27/2013 19:38   Ct Abdomen Pelvis W Contrast  10/27/2013   CLINICAL DATA:  Lower abdominal pain and nausea. History of appendectomy and hysterectomy.  EXAM: CT ABDOMEN AND PELVIS WITH CONTRAST  TECHNIQUE: Multidetector CT imaging of the abdomen and pelvis was performed using the standard protocol following bolus administration of intravenous contrast.  CONTRAST:  161m OMNIPAQUE IOHEXOL 300 MG/ML SOLN, 514mOMNIPAQUE IOHEXOL 300 MG/ML SOLN  COMPARISON:  11/28/2012  FINDINGS: The liver,  gallbladder, pancreas, spleen and adrenal glands are within normal limits. Tiny nonobstructing calculus is identified in the lower pole of the left kidney. No hydronephrosis.  Bowel shows no evidence of obstruction or inflammation. No free air, free fluid or abscess is identified.  The bladder is unremarkable. The uterus has been removed. No hernias are identified. No soft tissue masses or enlarged lymph nodes are identified.  The abdominal aorta and iliac arteries are heavily calcified. No evidence of aneurysmal disease. Bony structures are unremarkable.  IMPRESSION: No acute findings in the abdomen or pelvis. Tiny nonobstructive lower pole calculus in the left kidney.   Electronically Signed   By: GlAletta Edouard.D.   On: 10/27/2013 11:34    Assessment/Plan Principal Problem:   COPD with acute exacerbation Active Problems:   DIABETES MELLITUS, TYPE II   HYPERLIPIDEMIA   HYPERTENSION   Non Hodgkin's lymphoma   Acute respiratory failure with hypoxia   COPD exacerbation   1. Acute resp failure with hypoxia related to COPD exacerbation. Patient was noted to be wheezing and hypoxic on initial exam.  At the time of my evaluation, she is noted to have diminished breath sounds bilaterally, and requiring supplemental oxygen. She will be continued on bronchodilators, steroids and antibiotics.  Will continue with mucinex and flutter valve. If she is clinically improved tomorrow, she can possibly be discharged home with a short course of oxygen. 2. UTI.  Patient will be continued on rocephin. Follow up urine culture 3. Nausea and abdominal pain.  Likely related to #2. Continue supportive care. 4. DM. Hold oral agents and start SSI. Follow blood sugars closely since she will be on steroids. 5. History of Non Hodgkins lymphoma. Follow up with Dr. MoJulien Nordmannn six months 6. HTN. Continue outpatient regimen   Code Status: Limited code. No chest compression or defibrillation DVT Prophylaxis: lovenox Family  Communication: discussed  with patient Disposition Plan: discharge home once improved  Time spent: 37mns  Khiara Shuping [facility] Pager [phone] **Disclaimer: This note may have been dictated with voice recognition software. Similar sounding words can inadvertently be transcribed and this note may contain transcription errors which may not have been corrected upon publication of note.**

## 2013-10-27 NOTE — ED Notes (Signed)
Pt called out needing to use the bathroom; said nurse assisted pt to bathroom and back to bed, upon connecting pt back to monitor her O2 sats were 85% on room air; O2 at 2L applied and pt came up to 92%.  Dr. Stevie Kern informed of pt's condition.

## 2013-10-27 NOTE — ED Provider Notes (Signed)
CSN: [id number]     Arrival date & time 10/27/13  1756 History   First MD Initiated Contact with Patient 10/27/13 1835    This chart was scribed for Babette Relic, MD by Edison Simon, ED Scribe. This patient was seen in room A302/A302-01 and the patient's care was started at 6:40 PM.    Chief Complaint  Patient presents with  . Shortness of Breath   The history is provided by the patient. No language interpreter was used.    HPI Comments: Marie Holmes is a 78 y.o. female who presents to the Emergency Department complaining of shortness of breath. She was seen at this emergency department earlier today. At that time, she reported abdominal and back pain with onset 1 week ago; CT scan of her abdomen was unremarkable. She was found to have a possible UTI and was prescribed antibiotics. She also reports intermittent sweats with onset 1 week ago. Some wheezing was noted at the time of discharge but no significant SOB; she reports a history of COPD. She states she went home, was not able to get her prescriptions today, took a nap, and woke with diaphoresis, lightheadedness, and shortness of breath: symptoms that she had been experiencing previously. She denies confusion, rash, vomiting, or diarrhea.   Past Medical History  Diagnosis Date  . Benign tumor of breast 2004    left  . Broken ribs     history of several broken ribs on right and compression  fx  . Fibroma of skin     Multiple fibromas/lipomas on arms and legs B  . Pyelonephritis   . Anxiety   . COPD (chronic obstructive pulmonary disease)   . Hyperlipidemia   . Osteoarthritis   . History of CT scan of abdomen 05/23/2003    Mild fatty liver inf, tiny hypodensity in right lobe of liver  . Kidney stone 02/21/1981    Right/removed  . Pulmonary nodules   . Diabetes mellitus without complication   . Hx of radiation therapy 04/11/12- 05/08/12    left chest wall area, 40 gray 20 fx  . Hypertension   . Diverticulosis   . Adenomatous  colon polyp   . Cancer     non-hodgkins b cell lymphoma  . 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 Appendectomy  02/21/1961  . Exploratory laparotomy  02/21/1970    for ? adhesions  . Abdominal hysterectomy  02/21/1970    right ovary remaining  . Bone marrow biopsy  03/09/2012  . Lung core biopsy  02/06/2012   Family History  Problem Relation Age of Onset  . Parkinsonism Brother   . Stroke Father   . Cancer Sister   . Heart disease Brother   . Clotting disorder Father   . Kidney disease Mother    History  Substance Use Topics  . Smoking status: Former Smoker -- 0.50 packs/day for 30 years    Types: Cigarettes  . Smokeless tobacco: Never Used     Comment: 3/4 pack X 50 years  . Alcohol Use: No   OB History   Grav Para Term Preterm Abortions TAB SAB Ect Mult Living                 Review of Systems 10 Systems reviewed and are negative for acute change except as noted in the HPI.   Allergies  Sulfonamide derivatives and Chantix  Home Medications   Prior to Admission medications   Medication Sig Start Date End Date Taking? Authorizing Provider  albuterol (PROVENTIL HFA;VENTOLIN HFA) 108 (90 BASE) MCG/ACT inhaler Inhale 2 puffs into the lungs every 6 (six) hours as needed for wheezing or shortness of breath.   Yes Historical Provider, MD  alendronate (FOSAMAX) 70 MG tablet Take 70 mg by mouth every 7 (seven) days. Take with a full glass of water on an empty stomach. Takes on Tuesday 06/03/13  Yes Lucille Passy, MD  ALPRAZolam Duanne Moron) 0.5 MG tablet Take 0.5 mg by mouth at bedtime. 09/05/13  Yes Lucille Passy, MD  aspirin EC 81 MG tablet Take 81 mg by mouth every morning.   Yes Historical Provider, MD  citalopram (CELEXA) 40 MG tablet Take 1 tablet (40 mg total) by mouth every evening.   Yes Tonia Ghent, MD  glipiZIDE (GLUCOTROL) 5 MG tablet Take 1 tablet (5 mg total) by  mouth 2 (two) times daily before a meal. 09/30/13  Yes Lucille Passy, MD  losartan (COZAAR) 50 MG tablet Take 1 tablet (50 mg total) by mouth every morning. 06/03/13  Yes Lucille Passy, MD  metoprolol succinate (TOPROL-XL) 100 MG 24 hr tablet Take 1 tablet (100 mg total) by mouth every morning. 07/08/13  Yes Lucille Passy, MD  vitamin B-12 (CYANOCOBALAMIN) 500 MCG tablet Take 500 mcg by mouth daily.   Yes Historical Provider, MD  cephALEXin (KEFLEX) 500 MG capsule Take 1 capsule (500 mg total) by mouth 4 (four) times daily. 10/27/13   Johnna Acosta, MD  ondansetron (ZOFRAN) 4 MG tablet Take 1 tablet (4 mg total) by mouth every 8 (eight) hours as needed for nausea or vomiting. 10/27/13   Johnna Acosta, MD  sorbitol 70 % SOLN Take 30 mLs by mouth 2 (two) times daily. 10/28/13   Nita Sells, MD  traMADol (ULTRAM) 50 MG tablet Take 1 tablet (50 mg total) by mouth every 6 (six) hours as needed. 10/27/13   Johnna Acosta, MD   BP 132/58  Pulse 80  Temp(Src) 98.2 F (36.8 C) (Oral)  Resp 20  Ht 5' 1"  (1.549 m)  Wt 130 lb 8.2 oz (59.2 kg)  BMI 24.67 kg/m2  SpO2 95% Physical Exam  Nursing note and vitals reviewed. Constitutional:  Awake, alert, nontoxic appearance.  HENT:  Head: Atraumatic.  Eyes: Right eye exhibits no discharge. Left eye exhibits no discharge.  Neck: Neck supple.  Cardiovascular: Normal rate and regular rhythm.   No murmur heard. Pulmonary/Chest: Effort normal. She has wheezes (scattered mild expiratory). She exhibits no tenderness.  Room air pulse ox normal 94%, no crackles, no retractions, no accessory muscle use, speaks in sentences  Abdominal: Soft. Bowel sounds are normal. There is tenderness (midl diffuse). There is no rebound.  Musculoskeletal: She exhibits no tenderness.  Baseline ROM, no obvious new focal weakness.  Neurological:  Mental status and motor strength appears baseline for patient and situation.  Skin: No rash noted.  Psychiatric:  anxious    ED Course   Procedures (including critical care time) Walking to the bathroom in the emergency department the patient became hypoxic room air pulse oximetry 85% which improved back to the 90s with nasal cannula oxygen so [facility] paged. 2020 Labs Review Labs Reviewed  BASIC METABOLIC PANEL - Abnormal; Notable for the following:    Glucose, Bld 247 (*)    GFR calc non Af Amer 70 (*)    GFR calc Af  Amer 81 (*)    All other components within normal limits  BASIC METABOLIC PANEL - Abnormal; Notable for the following:    Glucose, Bld 282 (*)    GFR calc non Af Amer 82 (*)    All other components within normal limits  GLUCOSE, CAPILLARY - Abnormal; Notable for the following:    Glucose-Capillary 194 (*)    All other components within normal limits  GLUCOSE, CAPILLARY - Abnormal; Notable for the following:    Glucose-Capillary 275 (*)    All other components within normal limits  GLUCOSE, CAPILLARY - Abnormal; Notable for the following:    Glucose-Capillary 243 (*)    All other components within normal limits  HEPATIC FUNCTION PANEL - Abnormal; Notable for the following:    Total Bilirubin 0.2 (*)    All other components within normal limits  CBG MONITORING, ED - Abnormal; Notable for the following:    Glucose-Capillary 187 (*)    All other components within normal limits  CBC WITH DIFFERENTIAL  TROPONIN I  CBC  LIPASE, BLOOD  CBG MONITORING, ED    Imaging Review Dg Chest 2 View  10/27/2013   CLINICAL DATA:  Shortness of breath 2 days. History of lung cancer as well as non-Hodgkin's lymphoma.  EXAM: CHEST  2 VIEW  COMPARISON:  11/28/2012 and chest CT 10/07/2013  FINDINGS: Lungs are adequately inflated without consolidation or effusion. Cardiomediastinal silhouette and remainder of the exam is unchanged.  IMPRESSION: No active cardiopulmonary disease.   Electronically Signed   By: Marin Olp M.D.   On: 10/27/2013 19:38   Ct Abdomen Pelvis W Contrast  10/27/2013   CLINICAL DATA:  Lower abdominal  pain and nausea. History of appendectomy and hysterectomy.  EXAM: CT ABDOMEN AND PELVIS WITH CONTRAST  TECHNIQUE: Multidetector CT imaging of the abdomen and pelvis was performed using the standard protocol following bolus administration of intravenous contrast.  CONTRAST:  175m OMNIPAQUE IOHEXOL 300 MG/ML SOLN, 562mOMNIPAQUE IOHEXOL 300 MG/ML SOLN  COMPARISON:  11/28/2012  FINDINGS: The liver, gallbladder, pancreas, spleen and adrenal glands are within normal limits. Tiny nonobstructing calculus is identified in the lower pole of the left kidney. No hydronephrosis.  Bowel shows no evidence of obstruction or inflammation. No free air, free fluid or abscess is identified.  The bladder is unremarkable. The uterus has been removed. No hernias are identified. No soft tissue masses or enlarged lymph nodes are identified.  The abdominal aorta and iliac arteries are heavily calcified. No evidence of aneurysmal disease. Bony structures are unremarkable.  IMPRESSION: No acute findings in the abdomen or pelvis. Tiny nonobstructive lower pole calculus in the left kidney.   Electronically Signed   By: GlAletta Edouard.D.   On: 10/27/2013 11:34     EKG Interpretation None     ECG Muse not working: Sinus rhythm, ventricular rate 84, normal axis, septal Q waves, no acute ischemic changes noted no comparison available  Patient / Family / Caregiver understand and agree with initial ED impression and plan with expectations set for ED visit.   MDM   Final diagnoses:  COPD with acute exacerbation  Hypoxia  Abdominal pain, unspecified abdominal location  Urinary tract infection without hematuria, site unspecified    The patient appears reasonably stabilized for admission considering the current resources, flow, and capabilities available in the ED at this time, and I doubt any other [facility]iring further screening and/or treatment in the ED prior to admission. I personally performed the services described  in  this documentation, which was scribed in my presence. The recorded information has been reviewed and is accurate.    Babette Relic, MD 10/28/13 2122

## 2013-10-27 NOTE — Discharge Instructions (Signed)
Please call your doctor for a followup appointment within 24-48 hours. When you talk to your doctor please let them know that you were seen in the emergency department and have them acquire all of your records so that they can discuss the findings with you and formulate a treatment plan to fully care for your new and ongoing problems. ° °

## 2013-10-27 NOTE — ED Provider Notes (Signed)
CSN: [id number]     Arrival date & time 10/27/13  0911 History   This chart was scribed for Johnna Acosta, MD, by Neta Ehlers, ED Scribe. This patient was seen in room APA08/[facility]08 and the patient's care was started at 9:25 AM.  First MD Initiated Contact with Patient 10/27/13 0915     Chief Complaint  Patient presents with  . Abdominal Pain    The history is provided by the patient. No language interpreter was used.   HPI Comments: Marie Holmes is a 78 y.o. female, with a h/o pyelonephritis, diverticulitis, and renal calculi, who presents to the Emergency Department complaining of gradual-onset suprapubic pain and back pain which began a week ago and has daily gradually worsened. She characterizes the pain as "achey," and she reports the pain is increased when recumbent. The abdominal and back pain have been associated with mild constipation, chills, a subjective fever, nausea, dry-heaving at night, and feeling sweaty. Ms. Barner has also had a decreased appetite; she last ate yesterday evening at 6 pm; she ate a salad. She also endorses exacerbation of baseline COPD due to the pain. She denies diarrhea, hematochezia, lower extremity swelling, or dysuria. She has a h/o abdominal surgeries including a hysterectomy,  Appendectomy, and exploratory laparotomy. She denies a h/o cholecystectomy. Ms. Salonga has a h/o lung cancer; Dr. Earlie Server at [facility] is her oncologist; her last CT scan was last month in August.   Per medical records, the pt is suspected to have non-Hodgkins lymphoma lymphoid neoplasm in the lungs. Her last CT scan was in October of last year; she had no acute abdominal pathology.   Past Medical History  Diagnosis Date  . Benign tumor of breast 2004    left  . Broken ribs     history of several broken ribs on right and compression  fx  . Fibroma of skin     Multiple fibromas/lipomas on arms and legs B  . Pyelonephritis   . Anxiety   . COPD (chronic  obstructive pulmonary disease)   . Hyperlipidemia   . Osteoarthritis   . History of CT scan of abdomen 05/23/2003    Mild fatty liver inf, tiny hypodensity in right lobe of liver  . Kidney stone 02/21/1981    Right/removed  . Pulmonary nodules   . Diabetes mellitus without complication   . Hx of radiation therapy 04/11/12- 05/08/12    left chest wall area, 40 gray 20 fx  . Hypertension   . Diverticulosis   . Adenomatous colon polyp   . Cancer     non-hodgkins b cell lymphoma  . 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 Appendectomy  02/21/1961  . Exploratory laparotomy  02/21/1970    for ? adhesions  . Abdominal hysterectomy  02/21/1970    right ovary remaining  . Bone marrow biopsy  03/09/2012  . Lung core biopsy  02/06/2012   Family History  Problem Relation Age of Onset  . Parkinsonism Brother   . Stroke Father   . Cancer Sister   . Heart disease Brother   . Clotting disorder Father   . Kidney disease Mother    History  Substance Use Topics  . Smoking status: Former Smoker -- 0.50 packs/day for 30 years    Types: Cigarettes  . Smokeless tobacco: Never Used     Comment: 3/4 pack X 50  years  . Alcohol Use: No   No OB history provided.  Review of Systems  Constitutional: Positive for fever, chills, diaphoresis and appetite change.  Respiratory: Positive for shortness of breath. Negative for cough.   Gastrointestinal: Positive for nausea, abdominal pain and constipation. Negative for diarrhea and blood in stool.  Genitourinary: Negative for dysuria.  Musculoskeletal: Positive for back pain.  All other systems reviewed and are negative.   Allergies  Sulfonamide derivatives and Chantix  Home Medications   Prior to Admission medications   Medication Sig Start Date End Date Taking? Authorizing Provider  albuterol (PROVENTIL HFA;VENTOLIN HFA) 108 (90 BASE) MCG/ACT  inhaler Inhale 2 puffs into the lungs every 6 (six) hours as needed for wheezing or shortness of breath.   Yes Historical Provider, MD  alendronate (FOSAMAX) 70 MG tablet Take 70 mg by mouth every 7 (seven) days. Take with a full glass of water on an empty stomach. Takes on Tuesday 06/03/13  Yes Lucille Passy, MD  ALPRAZolam Duanne Moron) 0.5 MG tablet Take 0.5 mg by mouth at bedtime. 09/05/13  Yes Lucille Passy, MD  aspirin EC 81 MG tablet Take 81 mg by mouth every morning.   Yes Historical Provider, MD  citalopram (CELEXA) 40 MG tablet Take 1 tablet (40 mg total) by mouth every evening.   Yes Tonia Ghent, MD  glipiZIDE (GLUCOTROL) 5 MG tablet Take 1 tablet (5 mg total) by mouth 2 (two) times daily before a meal. 09/30/13  Yes Lucille Passy, MD  ibuprofen (ADVIL,MOTRIN) 200 MG tablet Take 400 mg by mouth every 8 (eight) hours as needed for mild pain. For pain   Yes Historical Provider, MD  losartan (COZAAR) 50 MG tablet Take 1 tablet (50 mg total) by mouth every morning. 06/03/13  Yes Lucille Passy, MD  metoprolol succinate (TOPROL-XL) 100 MG 24 hr tablet Take 1 tablet (100 mg total) by mouth every morning. 07/08/13  Yes Lucille Passy, MD  Multiple Vitamin (MULTIVITAMIN WITH MINERALS) TABS tablet Take 1 tablet by mouth daily.   Yes Historical Provider, MD  vitamin B-12 (CYANOCOBALAMIN) 500 MCG tablet Take 500 mcg by mouth daily.   Yes Historical Provider, MD  cephALEXin (KEFLEX) 500 MG capsule Take 1 capsule (500 mg total) by mouth 4 (four) times daily. 10/27/13   Johnna Acosta, MD  naproxen (NAPROSYN) 500 MG tablet Take 1 tablet (500 mg total) by mouth 2 (two) times daily with a meal. 10/27/13   Johnna Acosta, MD  ondansetron (ZOFRAN) 4 MG tablet Take 1 tablet (4 mg total) by mouth every 8 (eight) hours as needed for nausea or vomiting. 10/27/13   Johnna Acosta, MD  traMADol (ULTRAM) 50 MG tablet Take 1 tablet (50 mg total) by mouth every 6 (six) hours as needed. 10/27/13   Johnna Acosta, MD   Triage Vitals:  BP  159/64  Pulse 81  Temp(Src) 98.6 F (37 C) (Oral)  Resp 18  Ht 5' 1"  (1.549 m)  Wt 130 lb (58.968 kg)  BMI 24.58 kg/m2  SpO2 94%  Physical Exam  Nursing note and vitals reviewed. Constitutional: She appears well-developed and well-nourished. No distress.  HENT:  Head: Normocephalic and atraumatic.  Mouth/Throat: Oropharynx is clear and moist. Mucous membranes are dry. No oropharyngeal exudate.  Mucous membranes dry.   Eyes: Conjunctivae and EOM are normal. Pupils are equal, round, and reactive to light. Right eye exhibits no discharge. Left eye exhibits no discharge. No scleral icterus.  Neck: Normal range of motion. Neck supple. No JVD present. No thyromegaly present.  Cardiovascular: Normal rate, regular rhythm, normal heart sounds and intact distal pulses.  Exam reveals no gallop and no friction rub.   No murmur heard. Pulmonary/Chest: Effort normal. No respiratory distress. She has wheezes. She has no rales.  Mild expiratory wheeze.   Abdominal: Soft. Bowel sounds are normal. She exhibits no distension and no mass. There is tenderness.  Bowel sounds normal.  Suprapubic, left lower, right upper, and epigastric tenderness.   Musculoskeletal: Normal range of motion. She exhibits no edema and no tenderness.  Lymphadenopathy:    She has no cervical adenopathy.  Neurological: She is alert. Coordination normal.  Skin: Skin is warm and dry. No rash noted. No erythema.  Psychiatric: She has a normal mood and affect. Her behavior is normal.    ED Course  Procedures (including critical care time)  DIAGNOSTIC STUDIES: Oxygen Saturation is 94% on room air, normal by my interpretation.    COORDINATION OF CARE:  9:35 AM- Discussed treatment plan with patient, and the patient agreed to the plan. The plan includes IV fluids, morphine IM, Zofran IM, lab work, and a CT scan.   Labs Review Labs Reviewed  URINALYSIS, ROUTINE W REFLEX MICROSCOPIC - Abnormal; Notable for the following:     APPearance HAZY (*)    Hgb urine dipstick SMALL (*)    Leukocytes, UA SMALL (*)    All other components within normal limits  COMPREHENSIVE METABOLIC PANEL - Abnormal; Notable for the following:    Glucose, Bld 217 (*)    GFR calc non Af Amer 79 (*)    All other components within normal limits  URINE MICROSCOPIC-ADD ON - Abnormal; Notable for the following:    Squamous Epithelial / LPF FEW (*)    Bacteria, UA MANY (*)    Casts HYALINE CASTS (*)    All other components within normal limits  URINE CULTURE  CBC WITH DIFFERENTIAL  LIPASE, BLOOD    Imaging Review Ct Abdomen Pelvis W Contrast  10/27/2013   CLINICAL DATA:  Lower abdominal pain and nausea. History of appendectomy and hysterectomy.  EXAM: CT ABDOMEN AND PELVIS WITH CONTRAST  TECHNIQUE: Multidetector CT imaging of the abdomen and pelvis was performed using the standard protocol following bolus administration of intravenous contrast.  CONTRAST:  171m OMNIPAQUE IOHEXOL 300 MG/ML SOLN, 536mOMNIPAQUE IOHEXOL 300 MG/ML SOLN  COMPARISON:  11/28/2012  FINDINGS: The liver, gallbladder, pancreas, spleen and adrenal glands are within normal limits. Tiny nonobstructing calculus is identified in the lower pole of the left kidney. No hydronephrosis.  Bowel shows no evidence of obstruction or inflammation. No free air, free fluid or abscess is identified.  The bladder is unremarkable. The uterus has been removed. No hernias are identified. No soft tissue masses or enlarged lymph nodes are identified.  The abdominal aorta and iliac arteries are heavily calcified. No evidence of aneurysmal disease. Bony structures are unremarkable.  IMPRESSION: No acute findings in the abdomen or pelvis. Tiny nonobstructive lower pole calculus in the left kidney.   Electronically Signed   By: GlAletta Edouard.D.   On: 10/27/2013 11:34      MDM   Final diagnoses:  UTI (lower urinary tract infection)    Pt has abdominal pain of unknown etiology - she has mild  ttp but no surgical abdominal findings and VS which are unremarkable.  She has no SOB - she does have COPD and has had O2 sat's  94% on my exam repeatedly.  She has meds at home including albuterol which she uses regularly - this is not an acute part of the pt's presentation.  Filed Vitals:   10/27/13 1030 10/27/13 1100 10/27/13 1130 10/27/13 1246  BP: 124/66 158/66 133/58 112/46  Pulse: 74 71 75 66  Temp:    97.8 F (36.6 C)  TempSrc:    Oral  Resp:    20  Height:      Weight:      SpO2:    92%    I have reviewed the pt's labs with her including blood, UA and CT - she has beeen informed of the tx plan with abx and symptomatic control and f/u - UCx pending, no CT evidence of acute process.  Stable for d/c after IV Abx.  Pt expresses her understanding of the indications for return.  Meds given in ED:  Medications  0.9 %  sodium chloride infusion ( Intravenous Stopped 10/27/13 1247)  morphine 4 MG/ML injection 4 mg (4 mg Intravenous Given 10/27/13 0952)  ondansetron (ZOFRAN) injection 4 mg (4 mg Intravenous Given 10/27/13 0951)  iohexol (OMNIPAQUE) 300 MG/ML solution 50 mL (50 mLs Oral Contrast Given 10/27/13 1108)  iohexol (OMNIPAQUE) 300 MG/ML solution 100 mL (100 mLs Intravenous Contrast Given 10/27/13 1108)  cefTRIAXone (ROCEPHIN) 1 g in dextrose 5 % 50 mL IVPB (0 g Intravenous Stopped 10/27/13 1247)  HYDROcodone-acetaminophen (NORCO/VICODIN) 5-325 MG per tablet 2 tablet (2 tablets Oral Given 10/27/13 1158)    Discharge Medication List as of 10/27/2013 12:02 PM    START taking these medications   Details  cephALEXin (KEFLEX) 500 MG capsule Take 1 capsule (500 mg total) by mouth 4 (four) times daily., Starting 10/27/2013, Until Discontinued, Print    naproxen (NAPROSYN) 500 MG tablet Take 1 tablet (500 mg total) by mouth 2 (two) times daily with a meal., Starting 10/27/2013, Until Discontinued, Print    ondansetron (ZOFRAN) 4 MG tablet Take 1 tablet (4 mg total) by mouth every 8 (eight) hours as  needed for nausea or vomiting., Starting 10/27/2013, Until Discontinued, Print    traMADol (ULTRAM) 50 MG tablet Take 1 tablet (50 mg total) by mouth every 6 (six) hours as needed., Starting 10/27/2013, Until Discontinued, Print          I personally performed the services described in this documentation, which was scribed in my presence. The recorded information has been reviewed and is accurate.      Johnna Acosta, MD 10/27/13 [id number]

## 2013-10-27 NOTE — ED Notes (Signed)
Pt c/o pain coming back like it was when she came in.  Dr. Sabra Heck notified.

## 2013-10-27 NOTE — ED Notes (Signed)
Seen here earlier today and diagnosed with an UTI.  Went home and took a nap.  When she woke up she was sob and diaphoretic. Also c/o some sharp pains in her chest.

## 2013-10-28 LAB — BASIC METABOLIC PANEL
Anion gap: 12 (ref 5–15)
BUN: 11 mg/dL (ref 6–23)
CO2: 26 meq/L (ref 19–32)
CREATININE: 0.69 mg/dL (ref 0.50–1.10)
Calcium: 8.5 mg/dL (ref 8.4–10.5)
Chloride: 100 mEq/L (ref 96–112)
GFR calc Af Amer: >90 mL/min (ref >90)
GFR, EST NON AFRICAN AMERICAN: 82 mL/min — AB (ref >90)
GLUCOSE: 282 mg/dL — AB (ref 70–99)
Potassium: 4.3 mEq/L (ref 3.7–5.3)
SODIUM: 138 meq/L (ref 137–147)

## 2013-10-28 LAB — CBC
HEMATOCRIT: 40.8 % (ref 36.0–46.0)
Hemoglobin: 13.4 g/dL (ref 12.0–15.0)
MCH: 30.9 pg (ref 26.0–34.0)
MCHC: 32.8 g/dL (ref 30.0–36.0)
MCV: 94.2 fL (ref 78.0–100.0)
Platelets: 261 10*3/uL (ref 150–400)
RBC: 4.33 MIL/uL (ref 3.87–5.11)
RDW: 13 % (ref 11.5–15.5)
WBC: 6.6 10*3/uL (ref 4.0–10.5)

## 2013-10-28 LAB — URINE CULTURE
COLONY COUNT: NO GROWTH
Culture: NO GROWTH

## 2013-10-28 LAB — HEPATIC FUNCTION PANEL
ALK PHOS: 79 U/L (ref 39–117)
ALT: 10 U/L (ref 0–35)
AST: 13 U/L (ref 0–37)
Albumin: 3.5 g/dL (ref 3.5–5.2)
BILIRUBIN TOTAL: 0.2 mg/dL — AB (ref 0.3–1.2)
Total Protein: 6.5 g/dL (ref 6.0–8.3)

## 2013-10-28 LAB — GLUCOSE, CAPILLARY
GLUCOSE-CAPILLARY: 275 mg/dL — AB (ref 70–99)
Glucose-Capillary: 243 mg/dL — ABNORMAL HIGH (ref 70–99)

## 2013-10-28 LAB — LIPASE, BLOOD: LIPASE: 22 U/L (ref 11–59)

## 2013-10-28 MED ORDER — SORBITOL 70 % SOLN: 30.0000 mL | Freq: Every day | Status: DC | PRN

## 2013-10-28 MED ORDER — ALUM & MAG HYDROXIDE-SIMETH 200-200-20 MG/5ML PO SUSP
30.0000 mL | Freq: Once | ORAL | Status: AC
  Administered 2013-10-28: 30 mL via ORAL
  Filled 2013-10-28: qty 30

## 2013-10-28 MED ORDER — SORBITOL 70 % SOLN
30.0000 mL | Freq: Two times a day (BID) | Status: DC
Start: 2013-10-28 — End: 2013-10-28
  Filled 2013-10-28: qty 30

## 2013-10-28 MED ORDER — SORBITOL 70 % SOLN: 30.0000 mL | Freq: Two times a day (BID) | Status: DC

## 2013-10-28 MED ORDER — CETYLPYRIDINIUM CHLORIDE 0.05 % MT LIQD
7.0000 mL | Freq: Two times a day (BID) | OROMUCOSAL | Status: DC
  Administered 2013-10-28: 7 mL via OROMUCOSAL

## 2013-10-28 NOTE — Care Management Note (Signed)
    Page 1 of 1   10/28/2013     2:27:34 PM CARE MANAGEMENT NOTE 10/28/2013  Patient:  MAELEY, MATTON   Account Number:  [id number]  Date Initiated:  10/28/2013  Documentation initiated by:  Vladimir Creeks  Subjective/Objective Assessment:   Admitted with UTI and COPD exacerbation. Pt is from with family and will return home at D/C.     Action/Plan:   No needs identified.   Anticipated DC Date:  10/28/2013   Anticipated DC Plan:  [place]  CM consult      Choice offered to / List presented to:             Status of service:  Completed, signed off Medicare Important Message given?   (If response is "NO", the following Medicare IM given date fields will be blank) Date Medicare IM given:   Medicare IM given by:   Date Additional Medicare IM given:   Additional Medicare IM given by:    Discharge Disposition:  HOME/SELF CARE  Per UR Regulation:  Reviewed for med. necessity/level of care/duration of stay  If discussed at [place] of Stay Meetings, dates discussed:    Comments:  10/28/13 [place] RN/CM

## 2013-10-28 NOTE — Discharge Summary (Signed)
Physician Discharge Summary  XITLALLI NEWHARD TOI:[id number] DOB: 25-Dec-1935 DOA: 10/27/2013  PCP: Arnette Norris, MD  Admit date: 10/27/2013 Discharge date: 10/28/2013  Time spent: 35 minutes  Recommendations for Outpatient Follow-up:  1. Had workup for abdominal pain if no relief with antibiotics-clinically at time of pyelonephritis and kept on Keflex  2. Please followup urine cultures from 9/6:15 3. His nausea vomiting continues may benefit from gastric and a severe she has history of diabetes. 4. She did not require oxygen during this admission she ambulated and came right back up to 93% after a short drop to 88% 5. Please followup with oncologist and radiation oncologist as an outpatient  Discharge Diagnoses:  Principal Problem:   COPD with acute exacerbation Active Problems:   DIABETES MELLITUS, TYPE II   HYPERLIPIDEMIA   HYPERTENSION   Non Hodgkin's lymphoma   Acute respiratory failure with hypoxia   COPD exacerbation   Discharge Condition: Stable  Diet recommendation: Heart healthy  Filed Weights   10/27/13 1811 10/27/13 2237  Weight: 58.968 kg (130 lb) 59.2 kg (130 lb 8.2 oz)    History of present illness:  78 y/o ?, h/o ? NHL 09/2101 s/p XRT Dr. Roderick Pee by Dr. Earlie Server, COPD gold stg 3/Emphysema, H/p IBS with bowel leakage/Hemorrhoids admitted to [facility] 9/6 with recurrent abdominal pain and shortness of breath but she was seen previously in the emergency room and thought to have urinary tract section and sent home 9/7 return to the emergency room and on ambulating in the emergency room found to have a low oxygen level and was admitted as a COPD exacerbation. Her main complaint however which she might not have mentioned to the admitting physician is that she is having abdominal pain and bloating and discomfort for the past one week. She says she feels nauseous as well and does not have much of an appetite. She has no dysphagia, no melena, no actual vomiting. She has had no  fevers or chills. She does have her gallbladder in place  Felt ultimately based on repeat LFTs as well as lipase that she did not have any acute abdominal issue and have nausea vomiting could be related to gastritis vs. GERD with superimposed possible pyelonephritis. She's been treated with Keflex which should continue on her urinary study should be followed by her outpatient physician to be sure she's on her medication. She ambulated and dropped transiently to 80% but had no wheeze no cough no sputum hence it was felt that oral steroids and COPD exacerbation antibiotic such as azithromycin were not warranted. She is on beta once again prior to discharge for fairly comfortable and was advised for with her physician for further outpatient workup of no improvement. She maximally benefited from hospital stay at this point in time and will be discharged home   Discharge Exam: Filed Vitals:   10/28/13 1132  BP: 132/58  Pulse: 80  Temp:   Resp:     General: Alert pleasant oriented no apparent distress Cardiovascular: S1-S2 no murmur rub or gallop Respiratory: Clinically clear no added sound no wheeze no rhonchi no rales  Discharge Instructions You were cared for by a hospitalist during your hospital stay. If you have any questions about your discharge medications or the care you received while you were in the hospital after you are discharged, you can call the unit and asked to speak with the hospitalist on call if the hospitalist that took care of you is not available. Once you are discharged, your  primary care physician will handle any further medical issues. Please note that NO REFILLS for any discharge medications will be authorized once you are discharged, as it is imperative that you return to your primary care physician (or establish a relationship with a primary care physician if you do not have one) for your aftercare needs so that they can reassess your need for medications and monitor your  lab values.  Discharge Instructions   Diet - low sodium heart healthy    Complete by:  As directed      Discharge instructions    Complete by:  As directed   U. came to the hospital with either reflux or constipation and was found to have a  lower oxygen level . You were admitted but we do not think it is COPD flare. We did some labs and he did not have anything going on with the organs of her stomach. I would recommend having her continue some sorbitol for constipation and continue her reflux medications  If no other findings can be found her regular physician should be involved in your care to make a decision about further tests immediately to  Come back to the emergency room if you have any chest pain shortness of breath nausea vomiting bloody stools or urine     Increase activity slowly    Complete by:  As directed           Current Discharge Medication List    START taking these medications   Details  sorbitol 70 % SOLN Take 30 mLs by mouth 2 (two) times daily. Qty: 500 mL, Refills: 0      CONTINUE these medications which have NOT CHANGED   Details  albuterol (PROVENTIL HFA;VENTOLIN HFA) 108 (90 BASE) MCG/ACT inhaler Inhale 2 puffs into the lungs every 6 (six) hours as needed for wheezing or shortness of breath.    alendronate (FOSAMAX) 70 MG tablet Take 70 mg by mouth every 7 (seven) days. Take with a full glass of water on an empty stomach. Takes on Tuesday    ALPRAZolam (XANAX) 0.5 MG tablet Take 0.5 mg by mouth at bedtime.    aspirin EC 81 MG tablet Take 81 mg by mouth every morning.    citalopram (CELEXA) 40 MG tablet Take 1 tablet (40 mg total) by mouth every evening. Qty: 30 tablet, Refills: 1    glipiZIDE (GLUCOTROL) 5 MG tablet Take 1 tablet (5 mg total) by mouth 2 (two) times daily before a meal. Qty: 60 tablet, Refills: 5    losartan (COZAAR) 50 MG tablet Take 1 tablet (50 mg total) by mouth every morning. Qty: 30 tablet, Refills: 5    metoprolol succinate  (TOPROL-XL) 100 MG 24 hr tablet Take 1 tablet (100 mg total) by mouth every morning. Qty: 30 tablet, Refills: 5    vitamin B-12 (CYANOCOBALAMIN) 500 MCG tablet Take 500 mcg by mouth daily.    cephALEXin (KEFLEX) 500 MG capsule Take 1 capsule (500 mg total) by mouth 4 (four) times daily. Qty: 28 capsule, Refills: 0    ondansetron (ZOFRAN) 4 MG tablet Take 1 tablet (4 mg total) by mouth every 8 (eight) hours as needed for nausea or vomiting. Qty: 10 tablet, Refills: 0    traMADol (ULTRAM) 50 MG tablet Take 1 tablet (50 mg total) by mouth every 6 (six) hours as needed. Qty: 15 tablet, Refills: 0      STOP taking these medications     ibuprofen (ADVIL,MOTRIN) 200 MG tablet  Multiple Vitamin (MULTIVITAMIN WITH MINERALS) TABS tablet      naproxen (NAPROSYN) 500 MG tablet        Allergies  Allergen Reactions  . Sulfonamide Derivatives Anaphylaxis  . Chantix [Varenicline] Other (See Comments)    Made her angry, nausea, made blood pressure go up      The results of significant diagnostics from this hospitalization (including imaging, microbiology, ancillary and laboratory) are listed below for reference.    Significant Diagnostic Studies: Dg Chest 2 View  10/27/2013   CLINICAL DATA:  Shortness of breath 2 days. History of lung cancer as well as non-Hodgkin's lymphoma.  EXAM: CHEST  2 VIEW  COMPARISON:  11/28/2012 and chest CT 10/07/2013  FINDINGS: Lungs are adequately inflated without consolidation or effusion. Cardiomediastinal silhouette and remainder of the exam is unchanged.  IMPRESSION: No active cardiopulmonary disease.   Electronically Signed   By: Marin Olp M.D.   On: 10/27/2013 19:38   Ct Chest W Contrast  10/07/2013   CLINICAL DATA:  Lung cancer followup. History of non-Hodgkin's lymphoma.  EXAM: CT CHEST WITH CONTRAST  TECHNIQUE: Multidetector CT imaging of the chest was performed during intravenous contrast administration.  CONTRAST:  59mL OMNIPAQUE IOHEXOL 300  MG/ML  SOLN  COMPARISON:  None.  FINDINGS: No pleural effusion identified. Mild changes of centrilobular emphysema noted. Subpleural interstitial coarsening is identified within the lingula and left lower lobe. This is similar to previous exam and is favored to represent changes from external beam radiation, image 39/series 5. The right upper lobe pulmonary nodule is unchanged measuring 4 mm, image 21/series 5. No new or enlarging pulmonary nodules identified.  The heart size is normal. No pericardial effusion. Calcified atherosclerotic disease involves the thoracic aorta as well as the LAD coronary artery. There is no mediastinal or hilar adenopathy. The esophagus appears within normal limits. No enlarged axillary or supraclavicular adenopathy.  Incidental imaging through the upper abdomen is unremarkable.  Review of the visualized osseous structures is negative for aggressive lytic or sclerotic bone lesions.  IMPRESSION: 1. Stable CT of the chest. No suspicious findings or evidence of metastatic disease. 2. Similar appearance of subpleural interstitial reticulation and scarring within the lingula and left lower lobe which likely reflects changes of external beam radiation. 3. Emphysema 4. Stable right upper lobe nodule 5. Atherosclerotic disease.   Electronically Signed   By: Kerby Moors M.D.   On: 10/07/2013 12:19   Ct Abdomen Pelvis W Contrast  10/27/2013   CLINICAL DATA:  Lower abdominal pain and nausea. History of appendectomy and hysterectomy.  EXAM: CT ABDOMEN AND PELVIS WITH CONTRAST  TECHNIQUE: Multidetector CT imaging of the abdomen and pelvis was performed using the standard protocol following bolus administration of intravenous contrast.  CONTRAST:  140mL OMNIPAQUE IOHEXOL 300 MG/ML SOLN, 82mL OMNIPAQUE IOHEXOL 300 MG/ML SOLN  COMPARISON:  11/28/2012  FINDINGS: The liver, gallbladder, pancreas, spleen and adrenal glands are within normal limits. Tiny nonobstructing calculus is identified in the  lower pole of the left kidney. No hydronephrosis.  Bowel shows no evidence of obstruction or inflammation. No free air, free fluid or abscess is identified.  The bladder is unremarkable. The uterus has been removed. No hernias are identified. No soft tissue masses or enlarged lymph nodes are identified.  The abdominal aorta and iliac arteries are heavily calcified. No evidence of aneurysmal disease. Bony structures are unremarkable.  IMPRESSION: No acute findings in the abdomen or pelvis. Tiny nonobstructive lower pole calculus in the left kidney.  Electronically Signed   By: Aletta Edouard M.D.   On: 10/27/2013 11:34    Microbiology: No results found for this or any previous visit (from the past 240 hour(s)).   Labs: Basic Metabolic Panel:  Recent Labs Lab 10/27/13 0955 10/27/13 1822 10/28/13 0504  NA 141 137 138  K 4.0 4.5 4.3  CL 103 99 100  CO2 26 28 26   GLUCOSE 217* 247* 282*  BUN 15 13 11   CREATININE 0.79 0.80 0.69  CALCIUM 9.4 8.7 8.5   Liver Function Tests:  Recent Labs Lab 10/27/13 0955 10/28/13 0504  AST 13 13  ALT 10 10  ALKPHOS 86 79  BILITOT 0.6 0.2*  PROT 7.1 6.5  ALBUMIN 3.9 3.5    Recent Labs Lab 10/27/13 0955 10/28/13 0504  LIPASE 17 22   No results found for this basename: AMMONIA,  in the last 168 hours CBC:  Recent Labs Lab 10/27/13 0955 10/27/13 1822 10/28/13 0504  WBC 8.5 8.7 6.6  NEUTROABS 5.7 6.1  --   HGB 14.8 14.2 13.4  HCT 42.9 41.8 40.8  MCV 90.9 92.3 94.2  PLT 284 272 261   Cardiac Enzymes:  Recent Labs Lab 10/27/13 1822  TROPONINI <0.30   BNP: BNP (last 3 results)  Recent Labs  11/28/12 1330  PROBNP 302.2   CBG:  Recent Labs Lab 10/27/13 2041 10/27/13 2227 10/28/13 0746 10/28/13 1145  GLUCAP 187* 194* 275* 243*       Signed:  Nita Sells  [facility] 10/28/2013, 1:59 PM

## 2013-10-28 NOTE — Progress Notes (Signed)
Patient received discharge instructions and had no further questions/concerns.  Patient's IV was removed and was clean, dry, and intact at removal.  Patient was escorted to vehicle via wheelchair by nurse tech.

## 2013-10-28 NOTE — Care Management Utilization Note (Signed)
UR completed 

## 2013-10-28 NOTE — Progress Notes (Signed)
Prior to ambulation, patient's O2 sats were 94% on room air.  Patient ambulated 200 ft and was 90% on room air.

## 2013-10-30 ENCOUNTER — Encounter (HOSPITAL_COMMUNITY): Payer: Self-pay | Admitting: Emergency Medicine

## 2013-10-30 ENCOUNTER — Inpatient Hospital Stay (HOSPITAL_COMMUNITY): Payer: Medicare HMO

## 2013-10-30 ENCOUNTER — Emergency Department (HOSPITAL_COMMUNITY): Payer: Medicare HMO

## 2013-10-30 ENCOUNTER — Inpatient Hospital Stay (HOSPITAL_COMMUNITY)
Admission: EM | Admit: 2013-10-30 | Discharge: 2013-11-02 | DRG: 291 | Disposition: A | Discharge status: Home Health | Payer: Medicare HMO | Attending: Internal Medicine | Admitting: Internal Medicine

## 2013-10-30 ENCOUNTER — Telehealth: Payer: Self-pay | Admitting: Family Medicine

## 2013-10-30 DIAGNOSIS — I509 Heart failure, unspecified: Secondary | ICD-10-CM | POA: Diagnosis present

## 2013-10-30 DIAGNOSIS — Z923 Personal history of irradiation: Secondary | ICD-10-CM

## 2013-10-30 DIAGNOSIS — E785 Hyperlipidemia, unspecified: Secondary | ICD-10-CM | POA: Diagnosis present

## 2013-10-30 DIAGNOSIS — E119 Type 2 diabetes mellitus without complications: Secondary | ICD-10-CM | POA: Diagnosis present

## 2013-10-30 DIAGNOSIS — R079 Chest pain, unspecified: Secondary | ICD-10-CM | POA: Diagnosis present

## 2013-10-30 DIAGNOSIS — R0602 Shortness of breath: Secondary | ICD-10-CM | POA: Diagnosis not present

## 2013-10-30 DIAGNOSIS — Z87891 Personal history of nicotine dependence: Secondary | ICD-10-CM | POA: Diagnosis not present

## 2013-10-30 DIAGNOSIS — Z87898 Personal history of other specified conditions: Secondary | ICD-10-CM | POA: Diagnosis not present

## 2013-10-30 DIAGNOSIS — J9601 Acute respiratory failure with hypoxia: Secondary | ICD-10-CM | POA: Diagnosis present

## 2013-10-30 DIAGNOSIS — J441 Chronic obstructive pulmonary disease with (acute) exacerbation: Secondary | ICD-10-CM | POA: Diagnosis present

## 2013-10-30 DIAGNOSIS — Z823 Family history of stroke: Secondary | ICD-10-CM

## 2013-10-30 DIAGNOSIS — D72829 Elevated white blood cell count, unspecified: Secondary | ICD-10-CM | POA: Diagnosis present

## 2013-10-30 DIAGNOSIS — M1990 Unspecified osteoarthritis, unspecified site: Secondary | ICD-10-CM | POA: Diagnosis present

## 2013-10-30 DIAGNOSIS — Z8249 Family history of ischemic heart disease and other diseases of the circulatory system: Secondary | ICD-10-CM | POA: Diagnosis not present

## 2013-10-30 DIAGNOSIS — I5031 Acute diastolic (congestive) heart failure: Secondary | ICD-10-CM | POA: Diagnosis present

## 2013-10-30 DIAGNOSIS — J9600 Acute respiratory failure, unspecified whether with hypoxia or hypercapnia: Secondary | ICD-10-CM | POA: Diagnosis present

## 2013-10-30 DIAGNOSIS — I10 Essential (primary) hypertension: Secondary | ICD-10-CM | POA: Diagnosis present

## 2013-10-30 DIAGNOSIS — K8020 Calculus of gallbladder without cholecystitis without obstruction: Secondary | ICD-10-CM | POA: Diagnosis present

## 2013-10-30 DIAGNOSIS — Z85118 Personal history of other malignant neoplasm of bronchus and lung: Secondary | ICD-10-CM | POA: Diagnosis not present

## 2013-10-30 DIAGNOSIS — J449 Chronic obstructive pulmonary disease, unspecified: Secondary | ICD-10-CM | POA: Diagnosis present

## 2013-10-30 DIAGNOSIS — R112 Nausea with vomiting, unspecified: Secondary | ICD-10-CM | POA: Diagnosis present

## 2013-10-30 HISTORY — DX: Heart failure, unspecified: I50.9

## 2013-10-30 LAB — CBC WITH DIFFERENTIAL/PLATELET
BASOS ABS: 0 10*3/uL (ref 0.0–0.1)
BASOS PCT: 0 % (ref 0–1)
Eosinophils Absolute: 0.1 10*3/uL (ref 0.0–0.7)
Eosinophils Relative: 0 % (ref 0–5)
HEMATOCRIT: 39.2 % (ref 36.0–46.0)
Hemoglobin: 13.5 g/dL (ref 12.0–15.0)
Lymphocytes Relative: 8 % — ABNORMAL LOW (ref 12–46)
Lymphs Abs: 1.4 10*3/uL (ref 0.7–4.0)
MCH: 31.5 pg (ref 26.0–34.0)
MCHC: 34.4 g/dL (ref 30.0–36.0)
MCV: 91.6 fL (ref 78.0–100.0)
MONO ABS: 1.5 10*3/uL — AB (ref 0.1–1.0)
Monocytes Relative: 8 % (ref 3–12)
NEUTROS ABS: 15.4 10*3/uL — AB (ref 1.7–7.7)
Neutrophils Relative %: 84 % — ABNORMAL HIGH (ref 43–77)
PLATELETS: 261 10*3/uL (ref 150–400)
RBC: 4.28 MIL/uL (ref 3.87–5.11)
RDW: 13.5 % (ref 11.5–15.5)
WBC: 18.5 10*3/uL — ABNORMAL HIGH (ref 4.0–10.5)

## 2013-10-30 LAB — BASIC METABOLIC PANEL
Anion gap: 12 (ref 5–15)
BUN: 17 mg/dL (ref 6–23)
CALCIUM: 8.9 mg/dL (ref 8.4–10.5)
CO2: 27 mEq/L (ref 19–32)
Chloride: 102 mEq/L (ref 96–112)
Creatinine, Ser: 0.69 mg/dL (ref 0.50–1.10)
GFR calc Af Amer: >90 mL/min (ref >90)
GFR, EST NON AFRICAN AMERICAN: 82 mL/min — AB (ref >90)
Glucose, Bld: 234 mg/dL — ABNORMAL HIGH (ref 70–99)
Potassium: 4.1 mEq/L (ref 3.7–5.3)
SODIUM: 141 meq/L (ref 137–147)

## 2013-10-30 LAB — HEPATIC FUNCTION PANEL
ALBUMIN: 3.4 g/dL — AB (ref 3.5–5.2)
ALT: 14 U/L (ref 0–35)
AST: 16 U/L (ref 0–37)
Alkaline Phosphatase: 81 U/L (ref 39–117)
Bilirubin, Direct: <0.2 mg/dL (ref 0.0–0.3)
Total Bilirubin: 0.5 mg/dL (ref 0.3–1.2)
Total Protein: 6.4 g/dL (ref 6.0–8.3)

## 2013-10-30 LAB — TROPONIN I
Troponin I: <0.3 ng/mL (ref <0.30)
Troponin I: <0.3 ng/mL (ref <0.30)

## 2013-10-30 LAB — PRO B NATRIURETIC PEPTIDE: Pro B Natriuretic peptide (BNP): 3093 pg/mL — ABNORMAL HIGH (ref 0–450)

## 2013-10-30 LAB — GLUCOSE, CAPILLARY
GLUCOSE-CAPILLARY: 169 mg/dL — AB (ref 70–99)
Glucose-Capillary: 179 mg/dL — ABNORMAL HIGH (ref 70–99)

## 2013-10-30 LAB — D-DIMER, QUANTITATIVE: D-Dimer, Quant: 0.42 ug/mL-FEU (ref 0.00–0.48)

## 2013-10-30 LAB — LIPASE, BLOOD: LIPASE: 15 U/L (ref 11–59)

## 2013-10-30 MED ORDER — ACETAMINOPHEN 325 MG PO TABS
650.0000 mg | ORAL_TABLET | Freq: Once | ORAL | Status: AC
  Administered 2013-10-30: 650 mg via ORAL
  Filled 2013-10-30: qty 2

## 2013-10-30 MED ORDER — SODIUM CHLORIDE 0.9 % IV SOLN: 250.0000 mL | INTRAVENOUS | Status: DC | PRN

## 2013-10-30 MED ORDER — ONDANSETRON HCL 4 MG/2ML IJ SOLN
4.0000 mg | Freq: Four times a day (QID) | INTRAMUSCULAR | Status: DC | PRN
  Administered 2013-10-30 – 2013-11-01 (×4): 4 mg via INTRAVENOUS
  Filled 2013-10-30 (×4): qty 2

## 2013-10-30 MED ORDER — FUROSEMIDE 10 MG/ML IJ SOLN
40.0000 mg | Freq: Once | INTRAMUSCULAR | Status: AC
  Administered 2013-10-30: 40 mg via INTRAVENOUS
  Filled 2013-10-30: qty 4

## 2013-10-30 MED ORDER — GI COCKTAIL ~~LOC~~
30.0000 mL | Freq: Three times a day (TID) | ORAL | Status: DC | PRN
  Administered 2013-11-01 – 2013-11-02 (×2): 30 mL via ORAL
  Filled 2013-10-30 (×2): qty 30

## 2013-10-30 MED ORDER — DEXTROSE 5 % IV SOLN
500.0000 mg | Freq: Once | INTRAVENOUS | Status: AC
  Administered 2013-10-30: 500 mg via INTRAVENOUS
  Filled 2013-10-30: qty 500

## 2013-10-30 MED ORDER — IPRATROPIUM-ALBUTEROL 0.5-2.5 (3) MG/3ML IN SOLN
3.0000 mL | RESPIRATORY_TRACT | Status: DC
  Administered 2013-10-30 (×2): 3 mL via RESPIRATORY_TRACT
  Filled 2013-10-30 (×3): qty 3

## 2013-10-30 MED ORDER — ASPIRIN 81 MG PO CHEW
324.0000 mg | CHEWABLE_TABLET | Freq: Once | ORAL | Status: AC
  Administered 2013-10-30: 324 mg via ORAL
  Filled 2013-10-30: qty 4

## 2013-10-30 MED ORDER — ASPIRIN EC 81 MG PO TBEC
81.0000 mg | DELAYED_RELEASE_TABLET | Freq: Every morning | ORAL | Status: DC
  Administered 2013-10-30 – 2013-11-02 (×4): 81 mg via ORAL
  Filled 2013-10-30 (×4): qty 1

## 2013-10-30 MED ORDER — SODIUM CHLORIDE 0.9 % IJ SOLN
3.0000 mL | Freq: Two times a day (BID) | INTRAMUSCULAR | Status: DC
  Administered 2013-10-30 – 2013-11-02 (×7): 3 mL via INTRAVENOUS

## 2013-10-30 MED ORDER — CITALOPRAM HYDROBROMIDE 20 MG PO TABS
40.0000 mg | ORAL_TABLET | Freq: Every evening | ORAL | Status: DC
  Administered 2013-10-30 – 2013-10-31 (×2): 40 mg via ORAL
  Filled 2013-10-30 (×3): qty 2

## 2013-10-30 MED ORDER — ONDANSETRON HCL 4 MG/2ML IJ SOLN
4.0000 mg | Freq: Once | INTRAMUSCULAR | Status: AC
  Administered 2013-10-30: 4 mg via INTRAVENOUS
  Filled 2013-10-30: qty 2

## 2013-10-30 MED ORDER — SODIUM CHLORIDE 0.9 % IJ SOLN: 3.0000 mL | INTRAMUSCULAR | Status: DC | PRN

## 2013-10-30 MED ORDER — INSULIN ASPART 100 UNIT/ML ~~LOC~~ SOLN
0.0000 [IU] | Freq: Three times a day (TID) | SUBCUTANEOUS | Status: DC
  Administered 2013-10-30: 3 [IU] via SUBCUTANEOUS
  Administered 2013-10-31: 2 [IU] via SUBCUTANEOUS
  Administered 2013-10-31: 8 [IU] via SUBCUTANEOUS
  Administered 2013-10-31 – 2013-11-01 (×2): 3 [IU] via SUBCUTANEOUS
  Administered 2013-11-01 (×2): 5 [IU] via SUBCUTANEOUS
  Administered 2013-11-02: 8 [IU] via SUBCUTANEOUS
  Administered 2013-11-02 (×2): 3 [IU] via SUBCUTANEOUS

## 2013-10-30 MED ORDER — ALPRAZOLAM 0.5 MG PO TABS
0.5000 mg | ORAL_TABLET | Freq: Every day | ORAL | Status: DC
  Administered 2013-10-30 – 2013-11-01 (×3): 0.5 mg via ORAL
  Filled 2013-10-30 (×3): qty 1

## 2013-10-30 MED ORDER — VITAMIN B-12 1000 MCG PO TABS
500.0000 ug | ORAL_TABLET | Freq: Every day | ORAL | Status: DC
  Administered 2013-10-30 – 2013-11-02 (×4): 500 ug via ORAL
  Filled 2013-10-30 (×4): qty 1

## 2013-10-30 MED ORDER — FUROSEMIDE 10 MG/ML IJ SOLN
40.0000 mg | Freq: Two times a day (BID) | INTRAMUSCULAR | Status: DC
  Administered 2013-10-30 – 2013-10-31 (×3): 40 mg via INTRAVENOUS
  Filled 2013-10-30 (×3): qty 4

## 2013-10-30 MED ORDER — SORBITOL 70 % SOLN
30.0000 mL | Freq: Two times a day (BID) | Status: DC
  Administered 2013-10-30: 30 mL via ORAL
  Filled 2013-10-30 (×2): qty 30

## 2013-10-30 MED ORDER — INSULIN ASPART 100 UNIT/ML ~~LOC~~ SOLN: 0.0000 [IU] | Freq: Every day | SUBCUTANEOUS | Status: DC

## 2013-10-30 MED ORDER — DEXTROSE 5 % IV SOLN
1.0000 g | Freq: Once | INTRAVENOUS | Status: AC
  Administered 2013-10-30: 1 g via INTRAVENOUS
  Filled 2013-10-30: qty 10

## 2013-10-30 MED ORDER — SODIUM CHLORIDE 0.9 % IJ SOLN
INTRAMUSCULAR | Status: AC
  Filled 2013-10-30: qty 250

## 2013-10-30 MED ORDER — IOHEXOL 350 MG/ML SOLN
100.0000 mL | Freq: Once | INTRAVENOUS | Status: AC | PRN
  Administered 2013-10-30: 100 mL via INTRAVENOUS

## 2013-10-30 MED ORDER — METOPROLOL SUCCINATE ER 50 MG PO TB24
100.0000 mg | ORAL_TABLET | Freq: Every morning | ORAL | Status: DC
  Administered 2013-10-30 – 2013-11-02 (×4): 100 mg via ORAL
  Filled 2013-10-30 (×4): qty 2

## 2013-10-30 MED ORDER — ALBUTEROL SULFATE (2.5 MG/3ML) 0.083% IN NEBU: 2.5000 mg | INHALATION_SOLUTION | RESPIRATORY_TRACT | Status: DC | PRN

## 2013-10-30 MED ORDER — TRAMADOL HCL 50 MG PO TABS
50.0000 mg | ORAL_TABLET | Freq: Four times a day (QID) | ORAL | Status: DC | PRN
  Administered 2013-11-01: 50 mg via ORAL
  Filled 2013-10-30: qty 1

## 2013-10-30 MED ORDER — OXYCODONE-ACETAMINOPHEN 5-325 MG PO TABS
1.0000 | ORAL_TABLET | Freq: Four times a day (QID) | ORAL | Status: DC | PRN
  Administered 2013-10-31 – 2013-11-01 (×3): 1 via ORAL
  Filled 2013-10-30 (×3): qty 1

## 2013-10-30 MED ORDER — ONDANSETRON HCL 4 MG/2ML IJ SOLN
4.0000 mg | Freq: Four times a day (QID) | INTRAMUSCULAR | Status: DC | PRN
  Administered 2013-10-30: 4 mg via INTRAVENOUS
  Filled 2013-10-30: qty 2

## 2013-10-30 MED ORDER — ENOXAPARIN SODIUM 40 MG/0.4ML ~~LOC~~ SOLN
40.0000 mg | Freq: Every day | SUBCUTANEOUS | Status: DC
  Administered 2013-10-30 – 2013-11-02 (×4): 40 mg via SUBCUTANEOUS
  Filled 2013-10-30 (×4): qty 0.4

## 2013-10-30 MED ORDER — IPRATROPIUM-ALBUTEROL 0.5-2.5 (3) MG/3ML IN SOLN
3.0000 mL | Freq: Four times a day (QID) | RESPIRATORY_TRACT | Status: DC
  Administered 2013-10-31 (×4): 3 mL via RESPIRATORY_TRACT
  Filled 2013-10-30 (×4): qty 3

## 2013-10-30 MED ORDER — LOSARTAN POTASSIUM 50 MG PO TABS
50.0000 mg | ORAL_TABLET | Freq: Every morning | ORAL | Status: DC
  Administered 2013-10-30 – 2013-11-02 (×4): 50 mg via ORAL
  Filled 2013-10-30 (×4): qty 1

## 2013-10-30 MED ORDER — ACETAMINOPHEN 325 MG PO TABS
650.0000 mg | ORAL_TABLET | ORAL | Status: DC | PRN
  Administered 2013-10-30 – 2013-10-31 (×3): 650 mg via ORAL
  Filled 2013-10-30 (×3): qty 2

## 2013-10-30 NOTE — H&P (Signed)
[facility] History and Physical  Marie Holmes KPT:[id number] DOB: Oct 16, 1935 DOA: 10/30/2013  Referring physician: Dr. Roxanne Mins, ER physician PCP: Arnette Norris, MD   Chief Complaint: Shortness of breath  HPI: Marie Holmes is a 78 y.o. female history of COPD who was recently discharged from the hospital on 9/7 after being treated for a COPD exacerbation. She was discharged on room air. She reports that initially she felt her breathing was okay, but she progressively developed worsening shortness of breath. Shortness of breath this worse while laying down. She woke up several times at night being unable to breathe. She also describes some substernal chest discomfort as well as pain between her shoulder blades. She feels that she's also had a cough which is nonproductive. She has not had any fevers. Dysuria that she had previously had started to improve. She feels that her abdomen feels more bloated swollen. She's had persistent nausea and dry heaves. She is unable to keep down anything by mouth. Denies any specific abdominal pain. Stools have also been normal. She was evaluated in the emergency room her chest x-ray indicated developing interstitial edema and BNP was elevated. She'll be admitted for further treatments   Review of Systems:  Pertinent positives as per history of present illness, otherwise negative  Past Medical History  Diagnosis Date  . Benign tumor of breast 2004    left  . Broken ribs     history of several broken ribs on right and compression  fx  . Fibroma of skin     Multiple fibromas/lipomas on arms and legs B  . Pyelonephritis   . Anxiety   . COPD (chronic obstructive pulmonary disease)   . Hyperlipidemia   . Osteoarthritis   . History of CT scan of abdomen 05/23/2003    Mild fatty liver inf, tiny hypodensity in right lobe of liver  . Kidney stone 02/21/1981    Right/removed  . Pulmonary nodules   . Diabetes mellitus without complication   . Hx of  radiation therapy 04/11/12- 05/08/12    left chest wall area, 40 gray 20 fx  . Hypertension   . Diverticulosis   . Adenomatous colon polyp   . Cancer     non-hodgkins b cell lymphoma  . 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 Appendectomy  02/21/1961  . Exploratory laparotomy  02/21/1970    for ? adhesions  . Abdominal hysterectomy  02/21/1970    right ovary remaining  . Bone marrow biopsy  03/09/2012  . Lung core biopsy  02/06/2012   Social History:  reports that she quit smoking 7 days ago. Her smoking use included Cigarettes. She has a 15 pack-year smoking history. She has never used smokeless tobacco. She reports that she does not drink alcohol or use illicit drugs.  Allergies  Allergen Reactions  . Sulfonamide Derivatives Anaphylaxis  . Chantix [Varenicline] Other (See Comments)    Made her angry, nausea, made blood pressure go up    Family History  Problem Relation Age of Onset  . Parkinsonism Brother   . Stroke Father   . Cancer Sister   . Heart disease Brother   . Clotting disorder Father   . Kidney disease Mother      Prior to Admission medications   Medication Sig Start Date End Date Taking? Authorizing Provider  albuterol (PROVENTIL HFA;VENTOLIN HFA) 108 (90 BASE) MCG/ACT inhaler Inhale 2  puffs into the lungs every 6 (six) hours as needed for wheezing or shortness of breath.   Yes Historical Provider, MD  alendronate (FOSAMAX) 70 MG tablet Take 70 mg by mouth every 7 (seven) days. Take with a full glass of water on an empty stomach. Takes on Tuesday 06/03/13  Yes Lucille Passy, MD  ALPRAZolam Duanne Moron) 0.5 MG tablet Take 0.5 mg by mouth at bedtime. 09/05/13  Yes Lucille Passy, MD  aspirin EC 81 MG tablet Take 81 mg by mouth every morning.   Yes Historical Provider, MD  cephALEXin (KEFLEX) 500 MG capsule Take 1 capsule (500 mg total) by mouth 4 (four) times daily. 10/27/13  Yes  Johnna Acosta, MD  citalopram (CELEXA) 40 MG tablet Take 1 tablet (40 mg total) by mouth every evening.   Yes Tonia Ghent, MD  glipiZIDE (GLUCOTROL) 5 MG tablet Take 1 tablet (5 mg total) by mouth 2 (two) times daily before a meal. 09/30/13  Yes Lucille Passy, MD  losartan (COZAAR) 50 MG tablet Take 1 tablet (50 mg total) by mouth every morning. 06/03/13  Yes Lucille Passy, MD  metoprolol succinate (TOPROL-XL) 100 MG 24 hr tablet Take 1 tablet (100 mg total) by mouth every morning. 07/08/13  Yes Lucille Passy, MD  ondansetron (ZOFRAN) 4 MG tablet Take 1 tablet (4 mg total) by mouth every 8 (eight) hours as needed for nausea or vomiting. 10/27/13  Yes Johnna Acosta, MD  sorbitol 70 % SOLN Take 30 mLs by mouth 2 (two) times daily. 10/28/13  Yes Nita Sells, MD  traMADol (ULTRAM) 50 MG tablet Take 50 mg by mouth every 6 (six) hours as needed for moderate pain.   Yes Historical Provider, MD  vitamin B-12 (CYANOCOBALAMIN) 500 MCG tablet Take 500 mcg by mouth daily.   Yes Historical Provider, MD   Physical Exam: Filed Vitals:   10/30/13 0830 10/30/13 0851 10/30/13 0913 10/30/13 1132  BP: 125/59  129/57   Pulse:  80 77   Temp:   98.2 F (36.8 C)   TempSrc:   Oral   Resp:  27 20   Height:   5' 1"  (1.549 m) 5' 1"  (1.549 m)  Weight:   58.832 kg (129 lb 11.2 oz) 58.832 kg (129 lb 11.2 oz)  SpO2:  97% 99%     Wt Readings from Last 3 Encounters:  10/30/13 58.832 kg (129 lb 11.2 oz)  10/27/13 59.2 kg (130 lb 8.2 oz)  10/27/13 58.968 kg (130 lb)    General:  Appears calm and comfortable, becomes short of breath during conversation Eyes: PERRL, normal lids, irises & conjunctiva ENT: grossly normal hearing, lips & tongue Neck: no LAD, masses or thyromegaly Cardiovascular: RRR, no m/r/g. No LE edema. Telemetry: SR, no arrhythmias  Respiratory: Diminished breath sounds bilaterally. No wheezing. Mild Increased work of breathing. Abdomen: soft, obese, nontender, positive bowel sounds Skin: no  rash or induration seen on limited exam Musculoskeletal: grossly normal tone BUE/BLE Psychiatric: grossly normal mood and affect, speech fluent and appropriate Neurologic: grossly non-focal.          Labs on Admission:  Basic Metabolic Panel:  Recent Labs Lab 10/27/13 0955 10/27/13 1822 10/28/13 0504 10/30/13 0549  NA 141 137 138 141  K 4.0 4.5 4.3 4.1  CL 103 99 100 102  CO2 26 28 26 27   GLUCOSE 217* 247* 282* 234*  BUN 15 13 11 17   CREATININE 0.79 0.80 0.69 0.69  CALCIUM 9.4  8.7 8.5 8.9   Liver Function Tests:  Recent Labs Lab 10/27/13 0955 10/28/13 0504  AST 13 13  ALT 10 10  ALKPHOS 86 79  BILITOT 0.6 0.2*  PROT 7.1 6.5  ALBUMIN 3.9 3.5    Recent Labs Lab 10/27/13 0955 10/28/13 0504  LIPASE 17 22   No results found for this basename: AMMONIA,  in the last 168 hours CBC:  Recent Labs Lab 10/27/13 0955 10/27/13 1822 10/28/13 0504 10/30/13 0549  WBC 8.5 8.7 6.6 18.5*  NEUTROABS 5.7 6.1  --  15.4*  HGB 14.8 14.2 13.4 13.5  HCT 42.9 41.8 40.8 39.2  MCV 90.9 92.3 94.2 91.6  PLT 284 272 261 261   Cardiac Enzymes:  Recent Labs Lab 10/27/13 1822 10/30/13 0549  TROPONINI <0.30 <0.30    BNP (last 3 results)  Recent Labs  11/28/12 1330 10/30/13 0549  PROBNP 302.2 3093.0*   CBG:  Recent Labs Lab 10/27/13 2041 10/27/13 2227 10/28/13 0746 10/28/13 1145  GLUCAP 187* 194* 275* 243*    Radiological Exams on Admission: Dg Chest Port 1 View  10/30/2013   CLINICAL DATA:  Chest pain and shortness of breath. History of non-Hodgkin's lymphoma and radiation therapy.  EXAM: PORTABLE CHEST - 1 VIEW  COMPARISON:  10/27/2013  FINDINGS: Normal heart size and pulmonary vascularity. Suggestion of mild increased interstitial change in the lungs which may indicate interstitial pneumonia or edema. Interval blunting of left costophrenic angle suggesting developing small pleural effusion. No focal consolidation.  IMPRESSION: Developing interstitial edema and  small left pleural effusion.   Electronically Signed   By: Lucienne Capers M.D.   On: 10/30/2013 05:59    EKG: Independently reviewed. No acute ST/T changes  Assessment/Plan Active Problems:   DIABETES MELLITUS, TYPE II   HYPERTENSION   COPD   Acute respiratory failure with hypoxia   CHF exacerbation   Nausea with vomiting   Chest pain   1. Acute respiratory failure, likely multifactorial. We'll try to wean down oxygen as tolerated. 2. Congestive heart failure exacerbation. Possibly related to intravenous fluids she received during her prior hospitalization. Will check echocardiogram. Cycle cardiac markers. Check TSH. Strict intake and output. She'll be diuresed with intravenous Lasix. 3. Chest pain. Cycle cardiac markers and monitor on telemetry. Her d-dimer was found to be normal, but with sudden onset chest pain and worsening hypoxia without any clear physical exam findings, we will obtain CT and she wishes to rule out underlying pulmonary embolus. 4. Nausea and vomiting. Check liver function tests and lipase. We'll also check right upper quadrant ultrasound. 5. COPD. Diminished breath sounds on chest exam. We'll continue with bronchodilators. 6. Leukocytosis. Possibly related to recent steroids. Continue to follow. she does not appear septic or toxic 7. Diabetes. Sliding scale insulin. Restart glipizide watch his consistently eating 8. Hypertension. Continue outpatient regimen  Code Status: limited code, no cpr or defibrillation DVT Prophylaxis: lovenox Family Communication: discussed with patient Disposition Plan: discharge home once improved  Time spent: 66mns  Cashay Manganelli [facility] Pager [phone] **Disclaimer: This note may have been dictated with voice recognition software. Similar sounding words can inadvertently be transcribed and this note may contain transcription errors which may not have been corrected upon publication of note.**

## 2013-10-30 NOTE — ED Provider Notes (Signed)
CSN: [id number]     Arrival date & time 10/30/13  0165 History   First MD Initiated Contact with Patient 10/30/13 0534     Chief Complaint  Patient presents with  . Shortness of Breath  . Chest Pain     (Consider location/radiation/quality/duration/timing/severity/associated sxs/prior Treatment) Patient is a 78 y.o. female presenting with shortness of breath and chest pain. The history is provided by the patient.  Shortness of Breath Associated symptoms: chest pain   Chest Pain Associated symptoms: shortness of breath   She had been discharged from the hospital 2 days ago after being admitted for an exacerbation COPD. She woke up at 3 AM with severe, sharp chest pain in the upper back radiating through to the anterior chest. She rates pain at 8/10. There is associated dyspnea and very slight cough. There and some nausea but no vomiting. She has had diaphoresis. She tried treating herself with albuterol inhaler with no significant relief. She denies fever chills. Nothing makes her pain better nothing makes it worse. She states the pain is not worse with a deep breath but it feels like her breath cuts off.  Past Medical History  Diagnosis Date  . Benign tumor of breast 2004    left  . Broken ribs     history of several broken ribs on right and compression  fx  . Fibroma of skin     Multiple fibromas/lipomas on arms and legs B  . Pyelonephritis   . Anxiety   . COPD (chronic obstructive pulmonary disease)   . Hyperlipidemia   . Osteoarthritis   . History of CT scan of abdomen 05/23/2003    Mild fatty liver inf, tiny hypodensity in right lobe of liver  . Kidney stone 02/21/1981    Right/removed  . Pulmonary nodules   . Diabetes mellitus without complication   . Hx of radiation therapy 04/11/12- 05/08/12    left chest wall area, 40 gray 20 fx  . Hypertension   . Diverticulosis   . Adenomatous colon polyp   . Cancer     non-hodgkins b cell lymphoma  . 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 Appendectomy  02/21/1961  . Exploratory laparotomy  02/21/1970    for ? adhesions  . Abdominal hysterectomy  02/21/1970    right ovary remaining  . Bone marrow biopsy  03/09/2012  . Lung core biopsy  02/06/2012   Family History  Problem Relation Age of Onset  . Parkinsonism Brother   . Stroke Father   . Cancer Sister   . Heart disease Brother   . Clotting disorder Father   . Kidney disease Mother    History  Substance Use Topics  . Smoking status: Former Smoker -- 0.50 packs/day for 30 years    Types: Cigarettes    Quit date: 10/23/2013  . Smokeless tobacco: Never Used     Comment: 3/4 pack X 50 years  . Alcohol Use: No   OB History   Grav Para Term Preterm Abortions TAB SAB Ect Mult Living                 Review of Systems  Respiratory: Positive for shortness of breath.   Cardiovascular: Positive for chest pain.  All other systems reviewed and are negative.     Allergies  Sulfonamide derivatives and Chantix  Home Medications   Prior to Admission medications  Medication Sig Start Date End Date Taking? Authorizing Provider  albuterol (PROVENTIL HFA;VENTOLIN HFA) 108 (90 BASE) MCG/ACT inhaler Inhale 2 puffs into the lungs every 6 (six) hours as needed for wheezing or shortness of breath.    Historical Provider, MD  alendronate (FOSAMAX) 70 MG tablet Take 70 mg by mouth every 7 (seven) days. Take with a full glass of water on an empty stomach. Takes on Tuesday 06/03/13   Lucille Passy, MD  ALPRAZolam Duanne Moron) 0.5 MG tablet Take 0.5 mg by mouth at bedtime. 09/05/13   Lucille Passy, MD  aspirin EC 81 MG tablet Take 81 mg by mouth every morning.    Historical Provider, MD  cephALEXin (KEFLEX) 500 MG capsule Take 1 capsule (500 mg total) by mouth 4 (four) times daily. 10/27/13   Johnna Acosta, MD  citalopram (CELEXA) 40 MG tablet Take 1 tablet (40 mg total) by mouth  every evening.    Tonia Ghent, MD  glipiZIDE (GLUCOTROL) 5 MG tablet Take 1 tablet (5 mg total) by mouth 2 (two) times daily before a meal. 09/30/13   Lucille Passy, MD  losartan (COZAAR) 50 MG tablet Take 1 tablet (50 mg total) by mouth every morning. 06/03/13   Lucille Passy, MD  metoprolol succinate (TOPROL-XL) 100 MG 24 hr tablet Take 1 tablet (100 mg total) by mouth every morning. 07/08/13   Lucille Passy, MD  ondansetron (ZOFRAN) 4 MG tablet Take 1 tablet (4 mg total) by mouth every 8 (eight) hours as needed for nausea or vomiting. 10/27/13   Johnna Acosta, MD  sorbitol 70 % SOLN Take 30 mLs by mouth 2 (two) times daily. 10/28/13   Nita Sells, MD  traMADol (ULTRAM) 50 MG tablet Take 1 tablet (50 mg total) by mouth every 6 (six) hours as needed. 10/27/13   Johnna Acosta, MD  vitamin B-12 (CYANOCOBALAMIN) 500 MCG tablet Take 500 mcg by mouth daily.    Historical Provider, MD   BP 182/72  Pulse 97  Temp(Src) 98.4 F (36.9 C) (Oral)  Resp 20  Ht 5' 1"  (1.549 m)  Wt 130 lb (58.968 kg)  BMI 24.58 kg/m2  SpO2 95% Physical Exam  Nursing note and vitals reviewed.  78 year old female, resting comfortably and in no acute distress. Vital signs are significant for hypertension. Oxygen saturation is 95%, which is normal. Head is normocephalic and atraumatic. PERRLA, EOMI. Oropharynx is clear. Neck is nontender and supple without adenopathy or JVD. Back is nontender and there is no CVA tenderness. Lungs are clear without rales, wheezes, or rhonchi. Chest is nontender. Heart has regular rate and rhythm without murmur. Abdomen is soft, flat, nontender without masses or hepatosplenomegaly and peristalsis is normoactive. Extremities have 1+ edema, full range of motion is present. Skin is warm and dry without rash. Neurologic: Mental status is normal, cranial nerves are intact, there are no motor or sensory deficits.  ED Course  Procedures (including critical care time) Labs Review Results  for orders placed during the hospital encounter of 10/30/13  CBC WITH DIFFERENTIAL      Result Value Ref Range   WBC 18.5 (*) 4.0 - 10.5 K/uL   RBC 4.28  3.87 - 5.11 MIL/uL   Hemoglobin 13.5  12.0 - 15.0 g/dL   HCT 39.2  36.0 - 46.0 %   MCV 91.6  78.0 - 100.0 fL   MCH 31.5  26.0 - 34.0 pg   MCHC 34.4  30.0 - 36.0  g/dL   RDW 13.5  11.5 - 15.5 %   Platelets 261  150 - 400 K/uL   Neutrophils Relative % 84 (*) 43 - 77 %   Neutro Abs 15.4 (*) 1.7 - 7.7 K/uL   Lymphocytes Relative 8 (*) 12 - 46 %   Lymphs Abs 1.4  0.7 - 4.0 K/uL   Monocytes Relative 8  3 - 12 %   Monocytes Absolute 1.5 (*) 0.1 - 1.0 K/uL   Eosinophils Relative 0  0 - 5 %   Eosinophils Absolute 0.1  0.0 - 0.7 K/uL   Basophils Relative 0  0 - 1 %   Basophils Absolute 0.0  0.0 - 0.1 K/uL  TROPONIN I      Result Value Ref Range   Troponin I <0.30  <0.30 ng/mL  D-DIMER, QUANTITATIVE      Result Value Ref Range   D-Dimer, Quant 0.42  0.00 - 0.48 ug/mL-FEU  BASIC METABOLIC PANEL      Result Value Ref Range   Sodium 141  137 - 147 mEq/L   Potassium 4.1  3.7 - 5.3 mEq/L   Chloride 102  96 - 112 mEq/L   CO2 27  19 - 32 mEq/L   Glucose, Bld 234 (*) 70 - 99 mg/dL   BUN 17  6 - 23 mg/dL   Creatinine, Ser 0.69  0.50 - 1.10 mg/dL   Calcium 8.9  8.4 - 10.5 mg/dL   GFR calc non Af Amer 82 (*) >90 mL/min   GFR calc Af Amer >90  >90 mL/min   Anion gap 12  5 - 15  PRO B NATRIURETIC PEPTIDE      Result Value Ref Range   Pro B Natriuretic peptide (BNP) 3093.0 (*) 0 - 450 pg/mL   Imaging Review Dg Chest Port 1 View  10/30/2013   CLINICAL DATA:  Chest pain and shortness of breath. History of non-Hodgkin's lymphoma and radiation therapy.  EXAM: PORTABLE CHEST - 1 VIEW  COMPARISON:  10/27/2013  FINDINGS: Normal heart size and pulmonary vascularity. Suggestion of mild increased interstitial change in the lungs which may indicate interstitial pneumonia or edema. Interval blunting of left costophrenic angle suggesting developing small  pleural effusion. No focal consolidation.  IMPRESSION: Developing interstitial edema and small left pleural effusion.   Electronically Signed   By: Lucienne Capers M.D.   On: 10/30/2013 05:59     EKG Interpretation   Date/Time:  Wednesday October 30 2013 05:52:07 EDT Ventricular Rate:  90 PR Interval:  112 QRS Duration: 71 QT Interval:  349 QTC Calculation: 427 R Axis:   72 Text Interpretation:  Sinus rhythm Borderline short PR interval When  compared with ECG of 12/08/2003, No significant change was found Confirmed  by [facility]  MD, Winnona Wargo ([id number]) on 10/30/2013 5:54:42 AM      MDM   Final diagnoses:  CHF exacerbation    Chest pain and dyspnea which may represent a COPD exacerbation although his she does not have any evidence of bronchospasm on exam. Old records were reviewed confirming recent discharge for her COPD exacerbation. At that time, she actually was admitted for evaluation of abdominal pain.  Chest x-ray appears to show fluid overload although radiologist has indicated that it could represent interstitial pneumonia. She is noted the significantly elevated WBC with left shift although she does not show other clinical signs of pneumonia. BNP is coming back significantly elevated and I think that that is the cause of most of her  problems. She is given a dose of furosemide. Because of an apparently new onset CHF and also because of elevated WBC, decision was made to admit her. Case is discussed with Dr. Roderic Palau outside hospice who agrees to admit the patient. Decision was made to start empiric treatment for possible community-acquired pneumonia she's given a dose of ceftriaxone and azithromycin.  Delora Fuel, MD 14/99/69 [id number]

## 2013-10-30 NOTE — ED Notes (Signed)
Patient complaining of chest pain radiating into back since 0300 today and shortness of breath since yesterday when she was discharged from hospital.

## 2013-10-30 NOTE — Progress Notes (Signed)
Inpatient Diabetes Program Recommendations  AACE/ADA: New Consensus Statement on Inpatient Glycemic Control (2013)  Target Ranges:  Prepandial:   less than 140 mg/dL      Peak postprandial:   less than 180 mg/dL (1-2 hours)      Critically ill patients:  140 - 180 mg/dL   Results for ASTRAEA, GAUGHRAN (MRN [id number]) as of 10/30/2013 11:34  Ref. Range 10/30/2013 05:49  Glucose Latest Range: 70-99 mg/dl 234 (H)   Diabetes history: DM2 Outpatient Diabetes medications: Glipizide 5 mg BID Current orders for Inpatient glycemic control: None  Inpatient Diabetes Program Recommendations Correction (SSI): Please order CBGs with Novolog correction scale ACHS.  Thanks, Barnie Alderman, RN, MSN, CCRN Diabetes Coordinator Inpatient Diabetes Program [phone] (Team Pager) [phone] (AP office) [phone] [facility] office)

## 2013-10-30 NOTE — Telephone Encounter (Signed)
[name], Ms Harkin's daughter called to cancel her appointment tomorrow.  She stated Ms Trouten was re-admitted to [facility] for congestive heart .  [name] stated Ms Montalvo had 3000 unit of fluid around her heart

## 2013-10-30 NOTE — Telephone Encounter (Signed)
I'm sorry to hear this.  Thank you for the update.

## 2013-10-31 ENCOUNTER — Ambulatory Visit: Payer: Commercial Managed Care - HMO | Admitting: Family Medicine

## 2013-10-31 DIAGNOSIS — R112 Nausea with vomiting, unspecified: Secondary | ICD-10-CM

## 2013-10-31 DIAGNOSIS — I359 Nonrheumatic aortic valve disorder, unspecified: Secondary | ICD-10-CM

## 2013-10-31 LAB — GLUCOSE, CAPILLARY
GLUCOSE-CAPILLARY: 184 mg/dL — AB (ref 70–99)
Glucose-Capillary: 265 mg/dL — ABNORMAL HIGH (ref 70–99)
Glucose-Capillary: 141 mg/dL — ABNORMAL HIGH (ref 70–99)
Glucose-Capillary: 184 mg/dL — ABNORMAL HIGH (ref 70–99)

## 2013-10-31 LAB — CBC
HCT: 39.2 % (ref 36.0–46.0)
Hemoglobin: 13.5 g/dL (ref 12.0–15.0)
MCH: 31.5 pg (ref 26.0–34.0)
MCHC: 34.4 g/dL (ref 30.0–36.0)
MCV: 91.4 fL (ref 78.0–100.0)
PLATELETS: 246 10*3/uL (ref 150–400)
RBC: 4.29 MIL/uL (ref 3.87–5.11)
RDW: 13.5 % (ref 11.5–15.5)
WBC: 9.8 10*3/uL (ref 4.0–10.5)

## 2013-10-31 LAB — BASIC METABOLIC PANEL
Anion gap: 11 (ref 5–15)
BUN: 16 mg/dL (ref 6–23)
CHLORIDE: 99 meq/L (ref 96–112)
CO2: 31 mEq/L (ref 19–32)
Calcium: 9 mg/dL (ref 8.4–10.5)
Creatinine, Ser: 0.87 mg/dL (ref 0.50–1.10)
GFR, EST AFRICAN AMERICAN: 73 mL/min — AB (ref >90)
GFR, EST NON AFRICAN AMERICAN: 63 mL/min — AB (ref >90)
GLUCOSE: 173 mg/dL — AB (ref 70–99)
POTASSIUM: 3.8 meq/L (ref 3.7–5.3)
Sodium: 141 mEq/L (ref 137–147)

## 2013-10-31 LAB — TSH: TSH: 2.03 u[IU]/mL (ref 0.350–4.500)

## 2013-10-31 LAB — TROPONIN I: Troponin I: <0.3 ng/mL (ref <0.30)

## 2013-10-31 MED ORDER — FUROSEMIDE 20 MG PO TABS: 20.0000 mg | ORAL_TABLET | Freq: Every day | ORAL | Status: DC

## 2013-10-31 NOTE — Progress Notes (Signed)
[facility] PROGRESS NOTE  Marie Holmes QQV:[id number] DOB: Apr 30, 1935 DOA: 10/30/2013 PCP: Arnette Norris, MD  Assessment/Plan: 1. Acute respiratory failure. Likely related to congestive heart failure. Appears to be improving. Now weaned off of oxygen. Continue current treatments. 2. Acute diastolic congestive heart failure. Possibly related to intravenous fluids received during her prior hospitalization. Cardiac markers are unremarkable. She has diuresed well with intravenous Lasix. Will transition to by mouth. 3. Chest pain. CT angiogram negative for PE. Cardiac enzymes are negative. Likely related to congestive heart failure. Appears to have resolved. 4. Nausea and vomiting. Function tests and lipase are unremarkable. The right upper quadrant ultrasound indicates possible stone in the gallbladder neck. She does report chronic symptoms of nausea which may be related to this finding. We'll request HIDA scan. We'll also request general surgery input.  5. COPD. Diminished breath sounds bilaterally. Continue bronchodilators. No evidence of wheezing. 6.  leukocytosis. Resolved 7. Diabetes. Continue sliding scale insulin 8. Hypertension. Continue outpatient regimen  Code Status: limited code, no cpr or defibrillation Family Communication: discussed with patient Disposition Plan: discharge home once improved   Consultants:    Procedures: Echo:- Mild LVH with LVEF 33-29%, grade 1 diastolic dysfunction. Mildly thickened mitral leaflets. Mildly sclerotic aortic valve with mild aortic regurgitation. Unable to assess PASP. No pericardial effusion.    Antibiotics:    HPI/Subjective: Breathing is improving.  Still has continued nausea with eating  Objective: Filed Vitals:   10/31/13 1439  BP: 110/45  Pulse:   Temp:   Resp:     Intake/Output Summary (Last 24 hours) at 10/31/13 1919 Last data filed at 10/31/13 1802  Gross per 24 hour  Intake    720 ml  Output   1925 ml  Net   -1205 ml   Filed Weights   10/30/13 0913 10/30/13 1132 10/31/13 0515  Weight: 58.832 kg (129 lb 11.2 oz) 58.832 kg (129 lb 11.2 oz) 57.9 kg (127 lb 10.3 oz)    Exam:   General:  NAD  Cardiovascular: S1, S2 RRR  Respiratory: diminished breath sounds but clear bilaterally  Abdomen: soft, tender in RUQ, BS+  Musculoskeletal: no edema b/l   Data Reviewed: Basic Metabolic Panel:  Recent Labs Lab 10/27/13 0955 10/27/13 1822 10/28/13 0504 10/30/13 0549 10/31/13 0140  NA 141 137 138 141 141  K 4.0 4.5 4.3 4.1 3.8  CL 103 99 100 102 99  CO2 26 28 26 27 31   GLUCOSE 217* 247* 282* 234* 173*  BUN 15 13 11 17 16   CREATININE 0.79 0.80 0.69 0.69 0.87  CALCIUM 9.4 8.7 8.5 8.9 9.0   Liver Function Tests:  Recent Labs Lab 10/27/13 0955 10/28/13 0504 10/30/13 1434  AST 13 13 16   ALT 10 10 14   ALKPHOS 86 79 81  BILITOT 0.6 0.2* 0.5  PROT 7.1 6.5 6.4  ALBUMIN 3.9 3.5 3.4*    Recent Labs Lab 10/27/13 0955 10/28/13 0504 10/30/13 1434  LIPASE 17 22 15    No results found for this basename: AMMONIA,  in the last 168 hours CBC:  Recent Labs Lab 10/27/13 0955 10/27/13 1822 10/28/13 0504 10/30/13 0549 10/31/13 0140  WBC 8.5 8.7 6.6 18.5* 9.8  NEUTROABS 5.7 6.1  --  15.4*  --   HGB 14.8 14.2 13.4 13.5 13.5  HCT 42.9 41.8 40.8 39.2 39.2  MCV 90.9 92.3 94.2 91.6 91.4  PLT 284 272 261 261 246   Cardiac Enzymes:  Recent Labs Lab 10/27/13 1822 10/30/13 0549 10/30/13 1430 10/30/13  2008 10/31/13 0140  TROPONINI <0.30 <0.30 <0.30 <0.30 <0.30   BNP (last 3 results)  Recent Labs  11/28/12 1330 10/30/13 0549  PROBNP 302.2 3093.0*   CBG:  Recent Labs Lab 10/30/13 1613 10/30/13 2317 10/31/13 0724 10/31/13 1131 10/31/13 1632  GLUCAP 179* 169* 184* 265* 141*    Recent Results (from the past 240 hour(s))  URINE CULTURE     Status: None   Collection Time    10/27/13  9:26 AM      Result Value Ref Range Status   Specimen Description URINE, CLEAN CATCH    Final   Special Requests NONE   Final   Culture  Setup Time     Final   Value: 10/27/2013 23:06     Performed at [place]     Final   Value: NO GROWTH     Performed at [facility]   Culture     Final   Value: NO GROWTH     Performed at [facility]   Report Status 10/28/2013 FINAL   Final     Studies: Ct Angio Chest Pe W/cm &/or Wo Cm  10/30/2013   CLINICAL DATA:  Hypoxia and chest pain.  EXAM: CT ANGIOGRAPHY CHEST WITH CONTRAST  TECHNIQUE: Multidetector CT imaging of the chest was performed using the standard protocol during bolus administration of intravenous contrast. Multiplanar CT image reconstructions and MIPs were obtained to evaluate the vascular anatomy.  CONTRAST:  158mL OMNIPAQUE IOHEXOL 350 MG/ML SOLN  COMPARISON:  10/07/2013.  FINDINGS: Chest wall:  No breast masses, supraclavicular or axillary lymphadenopathy. The bony thorax is intact. No destructive bone lesions or spinal canal compromise. There are a few small scattered benign-appearing bone islands and there are small thoracic disc protrusions with nuclear trail signs.  Mediastinum:  The heart is normal in size. No pericardial effusion. No mediastinal or hilar mass or adenopathy. Small scattered lymph nodes are stable. The aorta is normal in caliber. No dissection. Stable moderate atherosclerotic calcifications. Stable coronary artery calcifications. The esophagus is grossly normal.  The pulmonary arterial tree is well opacified. No filling defects to suggest pulmonary emboli.  Examination of the lung parenchyma demonstrates stable bibasilar subpleural scarring changes in atelectasis. No worrisome pulmonary lesions. Stable 4 mm left upper lobe pulmonary nodule on image number 32. Moderate emphysematous changes are again noted. No pleural effusion.  The upper abdomen is unremarkable. Small calcified granulomas are noted in the spleen.  Review of the MIP images confirms the above findings.   IMPRESSION: No CT findings for pulmonary embolism.  Stable moderate atherosclerotic calcifications involving the aorta branch vessels.  Stable basilar scarring changes in atelectasis. Stable emphysematous changes.   Electronically Signed   By: Kalman Jewels M.D.   On: 10/30/2013 15:22   Dg Chest Port 1 View  10/30/2013   CLINICAL DATA:  Chest pain and shortness of breath. History of non-Hodgkin's lymphoma and radiation therapy.  EXAM: PORTABLE CHEST - 1 VIEW  COMPARISON:  10/27/2013  FINDINGS: Normal heart size and pulmonary vascularity. Suggestion of mild increased interstitial change in the lungs which may indicate interstitial pneumonia or edema. Interval blunting of left costophrenic angle suggesting developing small pleural effusion. No focal consolidation.  IMPRESSION: Developing interstitial edema and small left pleural effusion.   Electronically Signed   By: Lucienne Capers M.D.   On: 10/30/2013 05:59   US Abdomen Limited Ruq  10/30/2013   CLINICAL DATA:  Nausea, vomiting, epigastric abdominal  pain.  EXAM: US ABDOMEN LIMITED - RIGHT UPPER QUADRANT  COMPARISON:  None.  FINDINGS: Gallbladder:  The gallbladder is underdistended. There is a knee ill-defined echogenic structure within the neck of the gallbladder which measures approximately 0.7 x 0.4 x 0.5 cm (image 82). No definite gallbladder wall thickening given under distention. No pericholecystic fluid. Negative sonographic Murphy's sign.  Common bile duct:  Diameter: Normal in size measuring 2.5 mm in diameter  Liver:  There is diffuse increased echogenicity of the hepatic parenchyma suggestive of hepatic steatosis. No discrete hepatic lesions. No intrahepatic biliary duct dilatation. No ascites.  IMPRESSION: 1. Suspected approximately 7 mm stone within the neck of an underdistended but otherwise normal-appearing gallbladder. If concern persists for acute cholecystitis, further evaluation could be performed with a HIDA scan as clinically indicated.  2. Suspected hepatic steatosis.   Electronically Signed   By: Sandi Mariscal M.D.   On: 10/30/2013 16:15    Scheduled Meds: . ALPRAZolam  0.5 mg Oral QHS  . aspirin EC  81 mg Oral q morning - 10a  . citalopram  40 mg Oral QPM  . enoxaparin (LOVENOX) injection  40 mg Subcutaneous Daily  . [START ON 11/01/2013] furosemide  20 mg Oral Daily  . insulin aspart  0-15 Units Subcutaneous TID WC  . insulin aspart  0-5 Units Subcutaneous QHS  . ipratropium-albuterol  3 mL Nebulization Q6H  . losartan  50 mg Oral q morning - 10a  . metoprolol succinate  100 mg Oral q morning - 10a  . sodium chloride  3 mL Intravenous Q12H  . vitamin B-12  500 mcg Oral Daily   Continuous Infusions:   Active Problems:   DIABETES MELLITUS, TYPE II   HYPERTENSION   COPD   Acute respiratory failure with hypoxia   CHF exacerbation   Nausea with vomiting   Chest pain    Time spent: 73mins    MEMON,JEHANZEB  [facility] Pager [phone]. If 7PM-7AM, please contact night-coverage at [email or web address], password [facility] 10/31/2013, 7:19 PM  LOS: 1 day

## 2013-10-31 NOTE — Progress Notes (Signed)
Pt c/o unusual sweating at this time. Oral temp 98.6 & rectal temp 99.7. Administering 650 mg tylenol now. Will re-check on pt and notify hospitalist. Will continue to monitor. Bed is in lowest position & call bell is within reach.

## 2013-10-31 NOTE — Progress Notes (Signed)
  Echocardiogram 2D Echocardiogram has been performed.  [place], [place] 10/31/2013, 4:34 PM

## 2013-11-01 ENCOUNTER — Encounter (HOSPITAL_COMMUNITY): Payer: Self-pay

## 2013-11-01 ENCOUNTER — Inpatient Hospital Stay (HOSPITAL_COMMUNITY): Payer: Medicare HMO

## 2013-11-01 LAB — CBC
HCT: 40.4 % (ref 36.0–46.0)
Hemoglobin: 13.6 g/dL (ref 12.0–15.0)
MCH: 30.9 pg (ref 26.0–34.0)
MCHC: 33.7 g/dL (ref 30.0–36.0)
MCV: 91.8 fL (ref 78.0–100.0)
PLATELETS: 261 10*3/uL (ref 150–400)
RBC: 4.4 MIL/uL (ref 3.87–5.11)
RDW: 13.3 % (ref 11.5–15.5)
WBC: 7.4 10*3/uL (ref 4.0–10.5)

## 2013-11-01 LAB — COMPREHENSIVE METABOLIC PANEL
ALBUMIN: 3.2 g/dL — AB (ref 3.5–5.2)
ALT: 15 U/L (ref 0–35)
AST: 13 U/L (ref 0–37)
Alkaline Phosphatase: 91 U/L (ref 39–117)
Anion gap: 12 (ref 5–15)
BUN: 24 mg/dL — ABNORMAL HIGH (ref 6–23)
CALCIUM: 9.3 mg/dL (ref 8.4–10.5)
CO2: 33 mEq/L — ABNORMAL HIGH (ref 19–32)
Chloride: 97 mEq/L (ref 96–112)
Creatinine, Ser: 1.11 mg/dL — ABNORMAL HIGH (ref 0.50–1.10)
GFR calc Af Amer: 54 mL/min — ABNORMAL LOW (ref >90)
GFR calc non Af Amer: 47 mL/min — ABNORMAL LOW (ref >90)
GLUCOSE: 188 mg/dL — AB (ref 70–99)
Potassium: 3.7 mEq/L (ref 3.7–5.3)
SODIUM: 142 meq/L (ref 137–147)
TOTAL PROTEIN: 6.1 g/dL (ref 6.0–8.3)
Total Bilirubin: 0.4 mg/dL (ref 0.3–1.2)

## 2013-11-01 LAB — GLUCOSE, CAPILLARY
Glucose-Capillary: 169 mg/dL — ABNORMAL HIGH (ref 70–99)
Glucose-Capillary: 199 mg/dL — ABNORMAL HIGH (ref 70–99)
Glucose-Capillary: 230 mg/dL — ABNORMAL HIGH (ref 70–99)
Glucose-Capillary: 221 mg/dL — ABNORMAL HIGH (ref 70–99)
Glucose-Capillary: 164 mg/dL — ABNORMAL HIGH (ref 70–99)

## 2013-11-01 MED ORDER — SINCALIDE 5 MCG IJ SOLR
INTRAMUSCULAR | Status: AC
  Administered 2013-11-01: 1.16 ug via INTRAVENOUS
  Filled 2013-11-01: qty 5

## 2013-11-01 MED ORDER — PANTOPRAZOLE SODIUM 40 MG PO TBEC
40.0000 mg | DELAYED_RELEASE_TABLET | Freq: Two times a day (BID) | ORAL | Status: DC
  Administered 2013-11-01 – 2013-11-02 (×2): 40 mg via ORAL
  Filled 2013-11-01 (×2): qty 1

## 2013-11-01 MED ORDER — CITALOPRAM HYDROBROMIDE 20 MG PO TABS
20.0000 mg | ORAL_TABLET | Freq: Every evening | ORAL | Status: DC
  Administered 2013-11-01 – 2013-11-02 (×2): 20 mg via ORAL
  Filled 2013-11-01: qty 1

## 2013-11-01 MED ORDER — TECHNETIUM TC 99M MEBROFENIN IV KIT
5.0000 | PACK | Freq: Once | INTRAVENOUS | Status: AC | PRN
  Administered 2013-11-01: 5 via INTRAVENOUS

## 2013-11-01 MED ORDER — PROMETHAZINE HCL 25 MG/ML IJ SOLN
6.2500 mg | Freq: Four times a day (QID) | INTRAMUSCULAR | Status: DC | PRN
  Administered 2013-11-01 – 2013-11-02 (×2): 6.25 mg via INTRAVENOUS
  Filled 2013-11-01 (×2): qty 1

## 2013-11-01 MED ORDER — STERILE WATER FOR INJECTION IJ SOLN
INTRAMUSCULAR | Status: AC
  Administered 2013-11-01: 1.16 mL via INTRAVENOUS
  Filled 2013-11-01: qty 10

## 2013-11-01 MED ORDER — IPRATROPIUM-ALBUTEROL 0.5-2.5 (3) MG/3ML IN SOLN
3.0000 mL | Freq: Four times a day (QID) | RESPIRATORY_TRACT | Status: DC
  Administered 2013-11-01 – 2013-11-02 (×5): 3 mL via RESPIRATORY_TRACT
  Filled 2013-11-01 (×6): qty 3

## 2013-11-01 NOTE — Consult Note (Signed)
Reason for Consult: Nausea, possible biliary dyskinesia, cholelithiasis Referring Physician: Hospitalist  Marie Holmes is an 78 y.o. female.  HPI: Patient is a 78 year old white female who has had 2 admissions recently for COPD exacerbation and diastolic heart failure who was found on ultrasound the gallbladder to have a possible small gallstone. The gallbladder was contracted. Patient states she has had nausea for many years. She states she also has a funny taste in her mouth. The nausea is intermittent in nature, sometimes waking her up. She states she sometimes gets dry heaves. She denies any fever, chills, jaundice. She also states that she has some discomfort in the right upper quadrant. A HIDA scan was performed which revealed no acute cholecystitis, ejection fraction was 28%.  Past Medical History  Diagnosis Date  . Benign tumor of breast 2004    left  . Broken ribs     history of several broken ribs on right and compression  fx  . Fibroma of skin     Multiple fibromas/lipomas on arms and legs B  . Pyelonephritis   . Anxiety   . COPD (chronic obstructive pulmonary disease)   . Hyperlipidemia   . Osteoarthritis   . History of CT scan of abdomen 05/23/2003    Mild fatty liver inf, tiny hypodensity in right lobe of liver  . Kidney stone 02/21/1981    Right/removed  . Pulmonary nodules   . Diabetes mellitus without complication   . Hx of radiation therapy 04/11/12- 05/08/12    left chest wall area, 40 gray 20 fx  . Hypertension   . Diverticulosis   . Adenomatous colon polyp   . Cancer     non-hodgkins b cell lymphoma  . 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 CHF (congestive heart failure)     Past Surgical History  Procedure Laterality Date  . Appendectomy  02/21/1961  . Exploratory laparotomy  02/21/1970    for ? adhesions  . Abdominal hysterectomy  02/21/1970    right ovary remaining  . Bone marrow biopsy  03/09/2012  .  Lung core biopsy  02/06/2012    Family History  Problem Relation Age of Onset  . Parkinsonism Brother   . Stroke Father   . Cancer Sister   . Heart disease Brother   . Clotting disorder Father   . Kidney disease Mother     Social History:  reports that she quit smoking 9 days ago. Her smoking use included Cigarettes. She has a 15 pack-year smoking history. She has never used smokeless tobacco. She reports that she does not drink alcohol or use illicit drugs.  Allergies:  Allergies  Allergen Reactions  . Sulfonamide Derivatives Anaphylaxis  . Chantix [Varenicline] Other (See Comments)    Made her angry, nausea, made blood pressure go up    Medications: I have reviewed the patient's current medications.  Results for orders placed during the hospital encounter of 10/30/13 (from the past 48 hour(s))  TROPONIN I     Status: None   Collection Time    10/30/13  2:30 PM      Result Value Ref Range   Troponin I <0.30  <0.30 ng/mL   Comment:            Due to the release kinetics of cTnI,     a negative result within the first hours     of the onset of symptoms does not rule out  myocardial infarction with certainty.     If myocardial infarction is still suspected,     repeat the test at appropriate intervals.  TSH     Status: None   Collection Time    10/30/13  2:34 PM      Result Value Ref Range   TSH 2.030  0.350 - 4.500 uIU/mL   Comment: Performed at [facility] PANEL     Status: Abnormal   Collection Time    10/30/13  2:34 PM      Result Value Ref Range   Total Protein 6.4  6.0 - 8.3 g/dL   Albumin 3.4 (*) 3.5 - 5.2 g/dL   AST 16  0 - 37 U/L   ALT 14  0 - 35 U/L   Alkaline Phosphatase 81  39 - 117 U/L   Total Bilirubin 0.5  0.3 - 1.2 mg/dL   Bilirubin, Direct <0.2  0.0 - 0.3 mg/dL   Indirect Bilirubin NOT CALCULATED  0.3 - 0.9 mg/dL  LIPASE, BLOOD     Status: None   Collection Time    10/30/13  2:34 PM      Result Value Ref Range    Lipase 15  11 - 59 U/L  GLUCOSE, CAPILLARY     Status: Abnormal   Collection Time    10/30/13  4:13 PM      Result Value Ref Range   Glucose-Capillary 179 (*) 70 - 99 mg/dL  TROPONIN I     Status: None   Collection Time    10/30/13  8:08 PM      Result Value Ref Range   Troponin I <0.30  <0.30 ng/mL   Comment:            Due to the release kinetics of cTnI,     a negative result within the first hours     of the onset of symptoms does not rule out     myocardial infarction with certainty.     If myocardial infarction is still suspected,     repeat the test at appropriate intervals.  GLUCOSE, CAPILLARY     Status: Abnormal   Collection Time    10/30/13 11:17 PM      Result Value Ref Range   Glucose-Capillary 169 (*) 70 - 99 mg/dL   Comment 1 Notify RN    TROPONIN I     Status: None   Collection Time    10/31/13  1:40 AM      Result Value Ref Range   Troponin I <0.30  <0.30 ng/mL   Comment:            Due to the release kinetics of cTnI,     a negative result within the first hours     of the onset of symptoms does not rule out     myocardial infarction with certainty.     If myocardial infarction is still suspected,     repeat the test at appropriate intervals.  BASIC METABOLIC PANEL     Status: Abnormal   Collection Time    10/31/13  1:40 AM      Result Value Ref Range   Sodium 141  137 - 147 mEq/L   Potassium 3.8  3.7 - 5.3 mEq/L   Chloride 99  96 - 112 mEq/L   CO2 31  19 - 32 mEq/L   Glucose, Bld 173 (*) 70 - 99 mg/dL   BUN 16  6 - 23 mg/dL   Creatinine, Ser 0.87  0.50 - 1.10 mg/dL   Calcium 9.0  8.4 - 10.5 mg/dL   GFR calc non Af Amer 63 (*) >90 mL/min   GFR calc Af Amer 73 (*) >90 mL/min   Comment: (NOTE)     The eGFR has been calculated using the CKD EPI equation.     This calculation has not been validated in all clinical situations.     eGFR's persistently <90 mL/min signify possible Chronic Kidney     Disease.   Anion gap 11  5 - 15  CBC     Status:  None   Collection Time    10/31/13  1:40 AM      Result Value Ref Range   WBC 9.8  4.0 - 10.5 K/uL   RBC 4.29  3.87 - 5.11 MIL/uL   Hemoglobin 13.5  12.0 - 15.0 g/dL   HCT 39.2  36.0 - 46.0 %   MCV 91.4  78.0 - 100.0 fL   MCH 31.5  26.0 - 34.0 pg   MCHC 34.4  30.0 - 36.0 g/dL   RDW 13.5  11.5 - 15.5 %   Platelets 246  150 - 400 K/uL  GLUCOSE, CAPILLARY     Status: Abnormal   Collection Time    10/31/13  7:24 AM      Result Value Ref Range   Glucose-Capillary 184 (*) 70 - 99 mg/dL  GLUCOSE, CAPILLARY     Status: Abnormal   Collection Time    10/31/13 11:31 AM      Result Value Ref Range   Glucose-Capillary 265 (*) 70 - 99 mg/dL  GLUCOSE, CAPILLARY     Status: Abnormal   Collection Time    10/31/13  4:32 PM      Result Value Ref Range   Glucose-Capillary 141 (*) 70 - 99 mg/dL  GLUCOSE, CAPILLARY     Status: Abnormal   Collection Time    10/31/13  8:59 PM      Result Value Ref Range   Glucose-Capillary 184 (*) 70 - 99 mg/dL   Comment 1 Notify RN    GLUCOSE, CAPILLARY     Status: Abnormal   Collection Time    11/01/13 12:17 AM      Result Value Ref Range   Glucose-Capillary 169 (*) 70 - 99 mg/dL   Comment 1 Notify RN    CBC     Status: None   Collection Time    11/01/13  5:46 AM      Result Value Ref Range   WBC 7.4  4.0 - 10.5 K/uL   RBC 4.40  3.87 - 5.11 MIL/uL   Hemoglobin 13.6  12.0 - 15.0 g/dL   HCT 40.4  36.0 - 46.0 %   MCV 91.8  78.0 - 100.0 fL   MCH 30.9  26.0 - 34.0 pg   MCHC 33.7  30.0 - 36.0 g/dL   RDW 13.3  11.5 - 15.5 %   Platelets 261  150 - 400 K/uL  COMPREHENSIVE METABOLIC PANEL     Status: Abnormal   Collection Time    11/01/13  5:46 AM      Result Value Ref Range   Sodium 142  137 - 147 mEq/L   Potassium 3.7  3.7 - 5.3 mEq/L   Chloride 97  96 - 112 mEq/L   CO2 33 (*) 19 - 32 mEq/L   Glucose, Bld 188 (*) 70 - 99  mg/dL   BUN 24 (*) 6 - 23 mg/dL   Creatinine, Ser 1.11 (*) 0.50 - 1.10 mg/dL   Calcium 9.3  8.4 - 10.5 mg/dL   Total Protein 6.1   6.0 - 8.3 g/dL   Albumin 3.2 (*) 3.5 - 5.2 g/dL   AST 13  0 - 37 U/L   ALT 15  0 - 35 U/L   Alkaline Phosphatase 91  39 - 117 U/L   Total Bilirubin 0.4  0.3 - 1.2 mg/dL   GFR calc non Af Amer 47 (*) >90 mL/min   GFR calc Af Amer 54 (*) >90 mL/min   Comment: (NOTE)     The eGFR has been calculated using the CKD EPI equation.     This calculation has not been validated in all clinical situations.     eGFR's persistently <90 mL/min signify possible Chronic Kidney     Disease.   Anion gap 12  5 - 15  GLUCOSE, CAPILLARY     Status: Abnormal   Collection Time    11/01/13  8:50 AM      Result Value Ref Range   Glucose-Capillary 199 (*) 70 - 99 mg/dL   Comment 1 Documented in Chart     Comment 2 Notify RN      Ct Angio Chest Pe W/cm &/or Wo Cm  10/30/2013   CLINICAL DATA:  Hypoxia and chest pain.  EXAM: CT ANGIOGRAPHY CHEST WITH CONTRAST  TECHNIQUE: Multidetector CT imaging of the chest was performed using the standard protocol during bolus administration of intravenous contrast. Multiplanar CT image reconstructions and MIPs were obtained to evaluate the vascular anatomy.  CONTRAST:  161m OMNIPAQUE IOHEXOL 350 MG/ML SOLN  COMPARISON:  10/07/2013.  FINDINGS: Chest wall:  No breast masses, supraclavicular or axillary lymphadenopathy. The bony thorax is intact. No destructive bone lesions or spinal canal compromise. There are a few small scattered benign-appearing bone islands and there are small thoracic disc protrusions with nuclear trail signs.  Mediastinum:  The heart is normal in size. No pericardial effusion. No mediastinal or hilar mass or adenopathy. Small scattered lymph nodes are stable. The aorta is normal in caliber. No dissection. Stable moderate atherosclerotic calcifications. Stable coronary artery calcifications. The esophagus is grossly normal.  The pulmonary arterial tree is well opacified. No filling defects to suggest pulmonary emboli.  Examination of the lung parenchyma demonstrates  stable bibasilar subpleural scarring changes in atelectasis. No worrisome pulmonary lesions. Stable 4 mm left upper lobe pulmonary nodule on image number 32. Moderate emphysematous changes are again noted. No pleural effusion.  The upper abdomen is unremarkable. Small calcified granulomas are noted in the spleen.  Review of the MIP images confirms the above findings.  IMPRESSION: No CT findings for pulmonary embolism.  Stable moderate atherosclerotic calcifications involving the aorta branch vessels.  Stable basilar scarring changes in atelectasis. Stable emphysematous changes.   Electronically Signed   By: MKalman JewelsM.D.   On: 10/30/2013 15:22   Nm Hepato W/eject Fract  11/01/2013   CLINICAL DATA:  Upper abdominal pain with nausea and vomiting  EXAM: NUCLEAR MEDICINE HEPATOBILIARY IMAGING WITH GALLBLADDER EF  Views:  Anterior right upper quadrant  Radionuclide:  Technetium 964mebrofenin  Dose:  5.0 mCi  Route of administration: Intravenous  COMPARISON:  None.  FINDINGS: Liver uptake of radiotracer is normal. There is prompt visualization of gallbladder and small bowel, indicating patency of the cystic and common bile ducts. A weight based dose, 1.16 mcg, of CCK  was administered intravenously with calculation of the computer generated ejection fraction of radiotracer from the gallbladder. The patient experienced fairly significant abdominal pain with the CCK administration. The ejection fraction of radiotracer from the gallbladder is diminished at 28%, normal greater than 38%.  IMPRESSION: Abnormally low ejection fraction of radiotracer from the gallbladder, a finding concerning for biliary dyskinesis. Patient did experience pain with CCK administration. Cystic and common bile ducts are patent as is evidenced by visualization of gallbladder and small bowel.   Electronically Signed   By: Lowella Grip M.D.   On: 11/01/2013 09:45   US Abdomen Limited Ruq  10/30/2013   CLINICAL DATA:  Nausea, vomiting,  epigastric abdominal pain.  EXAM: US ABDOMEN LIMITED - RIGHT UPPER QUADRANT  COMPARISON:  None.  FINDINGS: Gallbladder:  The gallbladder is underdistended. There is a knee ill-defined echogenic structure within the neck of the gallbladder which measures approximately 0.7 x 0.4 x 0.5 cm (image 82). No definite gallbladder wall thickening given under distention. No pericholecystic fluid. Negative sonographic Murphy's sign.  Common bile duct:  Diameter: Normal in size measuring 2.5 mm in diameter  Liver:  There is diffuse increased echogenicity of the hepatic parenchyma suggestive of hepatic steatosis. No discrete hepatic lesions. No intrahepatic biliary duct dilatation. No ascites.  IMPRESSION: 1. Suspected approximately 7 mm stone within the neck of an underdistended but otherwise normal-appearing gallbladder. If concern persists for acute cholecystitis, further evaluation could be performed with a HIDA scan as clinically indicated. 2. Suspected hepatic steatosis.   Electronically Signed   By: Sandi Mariscal M.D.   On: 10/30/2013 16:15    ROS: See chart Blood pressure 109/56, pulse 68, temperature 97.8 F (36.6 C), temperature source Oral, resp. rate 20, height 5' 1"  (1.549 m), weight 58.1 kg (128 lb 1.4 oz), SpO2 97.00%. Physical Exam: White female in no acute distress, appears her stated age.  Abdomen is soft with no specific tenderness noted. No rigidity is noted. No hepatosplenomegaly is noted.  Assessment/Plan: Impression: Chronic nausea of unknown etiology. Even though her HIDA scan is  positive for a low ejection fraction, it is difficult for me to state that this is her primary cause of her nausea. I suspect it is multifactorial in nature. Her symptoms are atypical for chronic cholecystitis. I would not do any surgical procedure until her COPD exacerbation as clear. She may need further workup by either neurology or GI to further workup her episodes of nausea, as she also describes some vertigo to me.  She is cleared for discharge from a surgery standpoint. I will followup with her in the office as an outpatient as needed.  Jametta Moorehead A 11/01/2013, 9:59 AM

## 2013-11-01 NOTE — Progress Notes (Signed)
[facility] PROGRESS NOTE  Marie Holmes AYT:[id number] DOB: 09-12-1935 DOA: 10/30/2013 PCP: Arnette Norris, MD  Assessment/Plan: 1. Acute respiratory failure. Likely related to congestive heart failure. Appears to be improving. Still requiring low level of oxygen. May need to be discharged home on the same. Continue current treatments. 2. Acute diastolic congestive heart failure. Possibly related to intravenous fluids received during her prior hospitalization. Cardiac markers are unremarkable. She has diuresed well with intravenous Lasix. Serum creatinine started to trend up. Lasix will be changed to by mouth. Repeat chest x-ray. 3. Chest pain. CT angiogram negative for PE. Cardiac enzymes are negative. Possibly related to GI complaints. 4. Nausea and vomiting. Liver Function tests and lipase are unremarkable. The right upper quadrant ultrasound indicates possible stone in the gallbladder neck. Patient had HIDA scan done which showed decreased ejection fraction of radiotracer. Discussed with general surgery for was not felt that patient will acutely need to undergo cholecystectomy. If she does require removal of her gallbladder, preferably done electively in the outpatient setting. We'll start the patient on GI cocktail as well as Protonix see if gastritis may be playing a role..  5. COPD. Diminished breath sounds bilaterally. Continue bronchodilators. No evidence of wheezing. 6.  leukocytosis. Resolved 7. Diabetes. Continue sliding scale insulin 8. Hypertension. Continue outpatient regimen  Code Status: limited code, no cpr or defibrillation Family Communication: discussed with patient Disposition Plan: discharge home once improved   Consultants:  General surgery  Procedures: Echo:- Mild LVH with LVEF 35-57%, grade 1 diastolic dysfunction. Mildly thickened mitral leaflets. Mildly sclerotic aortic valve with mild aortic regurgitation. Unable to assess PASP. No  pericardial effusion.    Antibiotics:    HPI/Subjective: Feels her breathing is improving. Still has some nausea and describes epigastric discomfort  Objective: Filed Vitals:   11/01/13 1416  BP: 105/47  Pulse: 68  Temp: 98.4 F (36.9 C)  Resp: 20    Intake/Output Summary (Last 24 hours) at 11/01/13 1936 Last data filed at 11/01/13 1200  Gross per 24 hour  Intake    123 ml  Output    700 ml  Net   -577 ml   Filed Weights   10/30/13 1132 10/31/13 0515 11/01/13 0539  Weight: 58.832 kg (129 lb 11.2 oz) 57.9 kg (127 lb 10.3 oz) 58.1 kg (128 lb 1.4 oz)    Exam:   General:  NAD  Cardiovascular: S1, S2 RRR  Respiratory: diminished breath sounds but clear bilaterally  Abdomen: soft, tender in RUQ, BS+  Musculoskeletal: no edema b/l   Data Reviewed: Basic Metabolic Panel:  Recent Labs Lab 10/27/13 1822 10/28/13 0504 10/30/13 0549 10/31/13 0140 11/01/13 0546  NA 137 138 141 141 142  K 4.5 4.3 4.1 3.8 3.7  CL 99 100 102 99 97  CO2 28 26 27 31  33*  GLUCOSE 247* 282* 234* 173* 188*  BUN 13 11 17 16  24*  CREATININE 0.80 0.69 0.69 0.87 1.11*  CALCIUM 8.7 8.5 8.9 9.0 9.3   Liver Function Tests:  Recent Labs Lab 10/27/13 0955 10/28/13 0504 10/30/13 1434 11/01/13 0546  AST 13 13 16 13   ALT 10 10 14 15   ALKPHOS 86 79 81 91  BILITOT 0.6 0.2* 0.5 0.4  PROT 7.1 6.5 6.4 6.1  ALBUMIN 3.9 3.5 3.4* 3.2*    Recent Labs Lab 10/27/13 0955 10/28/13 0504 10/30/13 1434  LIPASE 17 22 15    No results found for this basename: AMMONIA,  in the last 168 hours CBC:  Recent Labs  Lab 10/27/13 0955 10/27/13 1822 10/28/13 0504 10/30/13 0549 10/31/13 0140 11/01/13 0546  WBC 8.5 8.7 6.6 18.5* 9.8 7.4  NEUTROABS 5.7 6.1  --  15.4*  --   --   HGB 14.8 14.2 13.4 13.5 13.5 13.6  HCT 42.9 41.8 40.8 39.2 39.2 40.4  MCV 90.9 92.3 94.2 91.6 91.4 91.8  PLT 284 272 261 261 246 261   Cardiac Enzymes:  Recent Labs Lab 10/27/13 1822 10/30/13 0549 10/30/13 1430  10/30/13 2008 10/31/13 0140  TROPONINI <0.30 <0.30 <0.30 <0.30 <0.30   BNP (last 3 results)  Recent Labs  11/28/12 1330 10/30/13 0549  PROBNP 302.2 3093.0*   CBG:  Recent Labs Lab 10/31/13 2059 11/01/13 0017 11/01/13 0850 11/01/13 1138 11/01/13 1656  GLUCAP 184* 169* 199* 230* 221*    Recent Results (from the past 240 hour(s))  URINE CULTURE     Status: None   Collection Time    10/27/13  9:26 AM      Result Value Ref Range Status   Specimen Description URINE, CLEAN CATCH   Final   Special Requests NONE   Final   Culture  Setup Time     Final   Value: 10/27/2013 23:06     Performed at [facility]     Final   Value: NO GROWTH     Performed at [facility]   Culture     Final   Value: NO GROWTH     Performed at [facility]   Report Status 10/28/2013 FINAL   Final     Studies: Nm Hepato W/eject Fract  11/01/2013   CLINICAL DATA:  Upper abdominal pain with nausea and vomiting  EXAM: NUCLEAR MEDICINE HEPATOBILIARY IMAGING WITH GALLBLADDER EF  Views:  Anterior right upper quadrant  Radionuclide:  Technetium 46m mebrofenin  Dose:  5.0 mCi  Route of administration: Intravenous  COMPARISON:  None.  FINDINGS: Liver uptake of radiotracer is normal. There is prompt visualization of gallbladder and small bowel, indicating patency of the cystic and common bile ducts. A weight based dose, 1.16 mcg, of CCK was administered intravenously with calculation of the computer generated ejection fraction of radiotracer from the gallbladder. The patient experienced fairly significant abdominal pain with the CCK administration. The ejection fraction of radiotracer from the gallbladder is diminished at 28%, normal greater than 38%.  IMPRESSION: Abnormally low ejection fraction of radiotracer from the gallbladder, a finding concerning for biliary dyskinesis. Patient did experience pain with CCK administration. Cystic and common bile ducts are patent as is  evidenced by visualization of gallbladder and small bowel.   Electronically Signed   By: Lowella Grip M.D.   On: 11/01/2013 09:45   Dg Chest Port 1 View  11/01/2013   CLINICAL DATA:  Shortness of breath, cough.  CHF and COPD.  EXAM: PORTABLE CHEST - 1 VIEW  COMPARISON:  10/30/2013  FINDINGS: Heart size is normal. There are no focal consolidations or pleural effusions. No pulmonary edema.  IMPRESSION: No evidence for acute  abnormality.   Electronically Signed   By: Shon Hale M.D.   On: 11/01/2013 17:31    Scheduled Meds: . ALPRAZolam  0.5 mg Oral QHS  . aspirin EC  81 mg Oral q morning - 10a  . citalopram  20 mg Oral QPM  . enoxaparin (LOVENOX) injection  40 mg Subcutaneous Daily  . insulin aspart  0-15 Units Subcutaneous TID WC  . insulin aspart  0-5 Units Subcutaneous  QHS  . ipratropium-albuterol  3 mL Nebulization QID  . losartan  50 mg Oral q morning - 10a  . metoprolol succinate  100 mg Oral q morning - 10a  . pantoprazole  40 mg Oral BID  . sodium chloride  3 mL Intravenous Q12H  . vitamin B-12  500 mcg Oral Daily   Continuous Infusions:   Active Problems:   DIABETES MELLITUS, TYPE II   HYPERTENSION   COPD   Acute respiratory failure with hypoxia   CHF exacerbation   Nausea with vomiting   Chest pain    Time spent: 38mins    MEMON,JEHANZEB  [facility] Pager [phone]. If 7PM-7AM, please contact night-coverage at [email or web address], password [facility] 11/01/2013, 7:36 PM  LOS: 2 days

## 2013-11-02 DIAGNOSIS — E119 Type 2 diabetes mellitus without complications: Secondary | ICD-10-CM

## 2013-11-02 DIAGNOSIS — J441 Chronic obstructive pulmonary disease with (acute) exacerbation: Secondary | ICD-10-CM

## 2013-11-02 DIAGNOSIS — I10 Essential (primary) hypertension: Secondary | ICD-10-CM

## 2013-11-02 LAB — BASIC METABOLIC PANEL
Anion gap: 9 (ref 5–15)
BUN: 26 mg/dL — ABNORMAL HIGH (ref 6–23)
CALCIUM: 9.3 mg/dL (ref 8.4–10.5)
CO2: 34 mEq/L — ABNORMAL HIGH (ref 19–32)
Chloride: 98 mEq/L (ref 96–112)
Creatinine, Ser: 1.04 mg/dL (ref 0.50–1.10)
GFR, EST AFRICAN AMERICAN: 59 mL/min — AB (ref >90)
GFR, EST NON AFRICAN AMERICAN: 51 mL/min — AB (ref >90)
Glucose, Bld: 162 mg/dL — ABNORMAL HIGH (ref 70–99)
POTASSIUM: 4.3 meq/L (ref 3.7–5.3)
Sodium: 141 mEq/L (ref 137–147)

## 2013-11-02 LAB — GLUCOSE, CAPILLARY
GLUCOSE-CAPILLARY: 255 mg/dL — AB (ref 70–99)
GLUCOSE-CAPILLARY: 161 mg/dL — AB (ref 70–99)
Glucose-Capillary: 155 mg/dL — ABNORMAL HIGH (ref 70–99)

## 2013-11-02 MED ORDER — METOCLOPRAMIDE HCL 5 MG/ML IJ SOLN
10.0000 mg | Freq: Once | INTRAMUSCULAR | Status: AC
  Administered 2013-11-02: 10 mg via INTRAVENOUS
  Filled 2013-11-02: qty 2

## 2013-11-02 MED ORDER — ALBUTEROL SULFATE (2.5 MG/3ML) 0.083% IN NEBU: 2.5000 mg | INHALATION_SOLUTION | RESPIRATORY_TRACT | Status: DC | PRN

## 2013-11-02 MED ORDER — FUROSEMIDE 40 MG PO TABS: 40.0000 mg | ORAL_TABLET | Freq: Every day | ORAL | Status: DC | PRN

## 2013-11-02 MED ORDER — OXYCODONE-ACETAMINOPHEN 5-325 MG PO TABS: 1.0000 | ORAL_TABLET | Freq: Four times a day (QID) | ORAL | Status: DC | PRN

## 2013-11-02 MED ORDER — GUAIFENESIN ER 600 MG PO TB12: 600.0000 mg | ORAL_TABLET | Freq: Two times a day (BID) | ORAL | Status: DC

## 2013-11-02 MED ORDER — PROMETHAZINE HCL 12.5 MG PO TABS: 12.5000 mg | ORAL_TABLET | Freq: Four times a day (QID) | ORAL | Status: DC | PRN

## 2013-11-02 MED ORDER — PANTOPRAZOLE SODIUM 40 MG PO TBEC: 40.0000 mg | DELAYED_RELEASE_TABLET | Freq: Two times a day (BID) | ORAL | Status: DC

## 2013-11-02 MED ORDER — TIOTROPIUM BROMIDE MONOHYDRATE 18 MCG IN CAPS: 18.0000 ug | ORAL_CAPSULE | Freq: Every day | RESPIRATORY_TRACT | Status: DC

## 2013-11-02 MED ORDER — PREDNISONE 10 MG PO TABS: ORAL_TABLET | ORAL | Status: DC

## 2013-11-02 NOTE — Progress Notes (Signed)
Patient O2 sat 95% on 2 liters by [facility]. Will ambulate in hallway to assess for oxygen needs.

## 2013-11-02 NOTE — Progress Notes (Signed)
Patient given Gi cocktail for nausea but got not relief. Reglan given for nausea as per Dr. Roderic Palau order, but patient stated it only helped a very little. Phenergan given at this time as ordered.

## 2013-11-02 NOTE — Progress Notes (Signed)
Patient has selected [facility] for her home health needs. Will contact them.

## 2013-11-02 NOTE — Progress Notes (Signed)
Awaiting [employer] approval for home O2 according to [facility]. Should not be much longer per representative. Family aware.

## 2013-11-02 NOTE — Progress Notes (Signed)
[facility] unable to use [facility] for medical equipment due to insurance. [facility] wants [facility] to do her home health RN and [facility] to do her medical equipment. All information faxed to the needed facility.

## 2013-11-02 NOTE — Discharge Summary (Signed)
Physician Discharge Summary  Marie Holmes PYP:[id number] DOB: 05-12-1935 DOA: 10/30/2013  PCP: Arnette Norris, MD  Admit date: 10/30/2013 Discharge date: 11/02/2013  Time spent: 40 minutes  Recommendations for Outpatient Follow-up:  1. Consider outpatient referral to Gastroenterology if nausea persists.  Discussed with Dr. Gala Romney and he is aware of the potential referral 2. May ultimately need referral to general surgery to evaluate need for cholecystectomy 3. Consider decreasing citalopram from 40mg  daily to 20mg  daily based on FDA recommendations 4. Patient is being discharged home with supplemental oxygen at 2L.  Please re assess need to continue oxygen on follow up 5. She has been set up with home health RN  Discharge Diagnoses:  Active Problems:   DIABETES MELLITUS, TYPE II   HYPERTENSION   COPD   Acute respiratory failure with hypoxia   CHF exacerbation   Nausea with vomiting   Chest pain  cholelithiasis Possible biliary dyskinesia Possible gastritis  Discharge Condition: stable  Diet recommendation: low fat, low salt, low carb  Filed Weights   10/31/13 0515 11/01/13 0539 11/02/13 0654  Weight: 57.9 kg (127 lb 10.3 oz) 58.1 kg (128 lb 1.4 oz) 59.149 kg (130 lb 6.4 oz)    History of present illness and hospital course:  This patient was admitted to the hospital with progressive shortness of breath, nausea and epigastric discomfort. She has a history of COPD, but chest x-ray on admission indicated evidence of volume overload. This may be related to her prior admission where she received IV fluids. She was started on intravenous Lasix with improvement in pulmonary edema, and resolution of volume overload on repeat chest x-ray. She does not have any further evidence of volume overload. She's been advised to use Lasix on an as-needed basis for swelling or shortness of breath.  She was noted to be hypoxic, requiring supplemental oxygen on ambulation. She is diminished breath sounds  and evidence of COPD on chest x-ray. She'll be discharged home on supplemental oxygen as well as Spiriva, course of prednisone and bronchodilators. She can follow up with her primary care physician for further management.  Regarding her nausea, she was found to have epigastric discomfort with nausea but no real vomiting. She's been having this for many weeks/months. Liver enzymes and lipase were found to be normal. Abdominal ultrasound showed gallbladder with possible stone in the gallbladder neck. This was further evaluated with HIDA scan which showed abnormally low ejection fraction of radiotracer from the gallbladder, concerning for possible biliary dyskinesis. She was evaluated by general surgery who felt her symptoms were atypical for chronic cholecystitis. It was felt that her nausea was likely multifactorial in etiology. It was not felt that cholecystectomy was needed at this time. Case was further discussed with gastroenterology and recommendations were to start the patient on proton pump inhibitors and followup with gastroenterology as an outpatient if her nausea persisted. She may need an upper endoscopy, but this can be accomplished in the outpatient setting. If GI workup is unremarkable, she may ultimately need referral back to general surgery for consideration of cholecystectomy  Procedures:    Consultations:  General surgery  Discharge Exam: Filed Vitals:   11/02/13 1518  BP: 108/44  Pulse: 73  Temp: 98 F (36.7 C)  Resp: 18    General: NAD Cardiovascular: S1, S2 RRR Respiratory: CTA B  Discharge Instructions You were cared for by a hospitalist during your hospital stay. If you have any questions about your discharge medications or the care you received while you  were in the hospital after you are discharged, you can call the unit and asked to speak with the hospitalist on call if the hospitalist that took care of you is not available. Once you are discharged, your primary  care physician will handle any further medical issues. Please note that NO REFILLS for any discharge medications will be authorized once you are discharged, as it is imperative that you return to your primary care physician (or establish a relationship with a primary care physician if you do not have one) for your aftercare needs so that they can reassess your need for medications and monitor your lab values.  Discharge Instructions   Call MD for:  difficulty breathing, headache or visual disturbances    Complete by:  As directed      Call MD for:  persistant nausea and vomiting    Complete by:  As directed      Call MD for:  temperature >100.4    Complete by:  As directed      DME Nebulizer machine    Complete by:  As directed      Diet - low sodium heart healthy    Complete by:  As directed      Face-to-face encounter (required for Medicare/Medicaid patients)    Complete by:  As directed   I Aylee Littrell certify that this patient is under my care and that I, or a nurse practitioner or physician's assistant working with me, had a face-to-face encounter that meets the physician face-to-face encounter requirements with this patient on 11/02/2013. The encounter with the patient was in whole, or in part for the following medical condition(s) which is the primary reason for home health care (List medical condition): admitted with chf/copd exacerbation.  Would benefit from home health RN for disease management  The encounter with the patient was in whole, or in part, for the following medical condition, which is the primary reason for home health care:  copd/chf exacerbation  I certify that, based on my findings, the following services are medically necessary home health services:  Nursing  My clinical findings support the need for the above services:  Shortness of breath with activity  Further, I certify that my clinical findings support that this patient is homebound due to:  Shortness of Breath with  activity  Reason for Medically Necessary Home Health Services:  Skilled Nursing- Skilled Assessment/Observation     For home use only DME oxygen    Complete by:  As directed   Mode or (Route):  Nasal cannula  Liters per Minute:  2  Frequency:  Continuous (stationary and portable oxygen unit needed)  Oxygen conserving device:  Yes  Oxygen delivery system:  Gas     Home Health    Complete by:  As directed   To provide the following care/treatments:  RN     Increase activity slowly    Complete by:  As directed           Current Discharge Medication List    START taking these medications   Details  albuterol (PROVENTIL) (2.5 MG/3ML) 0.083% nebulizer solution Take 3 mLs (2.5 mg total) by nebulization every 4 (four) hours as needed for wheezing or shortness of breath. Qty: 75 mL, Refills: 12    furosemide (LASIX) 40 MG tablet Take 1 tablet (40 mg total) by mouth daily as needed (swelling, shortness of breath). Qty: 30 tablet, Refills: 1    guaiFENesin (MUCINEX) 600 MG 12 hr tablet Take 1 tablet (  600 mg total) by mouth 2 (two) times daily. Qty: 14 tablet, Refills: 0    oxyCODONE-acetaminophen (PERCOCET/ROXICET) 5-325 MG per tablet Take 1 tablet by mouth every 6 (six) hours as needed for severe pain. Qty: 20 tablet, Refills: 0    pantoprazole (PROTONIX) 40 MG tablet Take 1 tablet (40 mg total) by mouth 2 (two) times daily. Qty: 60 tablet, Refills: 1    predniSONE (DELTASONE) 10 MG tablet Take 40mg  po daily for 2 days then 30mg  po daily for 2 days then 20mg  po daily for 2 days then 10mg  po daily for 2 days then stop Qty: 20 tablet, Refills: 0    promethazine (PHENERGAN) 12.5 MG tablet Take 1 tablet (12.5 mg total) by mouth every 6 (six) hours as needed for nausea or vomiting. Qty: 30 tablet, Refills: 1    tiotropium (SPIRIVA HANDIHALER) 18 MCG inhalation capsule Place 1 capsule (18 mcg total) into inhaler and inhale daily. Qty: 30 capsule, Refills: 12      CONTINUE these  medications which have NOT CHANGED   Details  albuterol (PROVENTIL HFA;VENTOLIN HFA) 108 (90 BASE) MCG/ACT inhaler Inhale 2 puffs into the lungs every 6 (six) hours as needed for wheezing or shortness of breath.    alendronate (FOSAMAX) 70 MG tablet Take 70 mg by mouth every 7 (seven) days. Take with a full glass of water on an empty stomach. Takes on Tuesday    ALPRAZolam (XANAX) 0.5 MG tablet Take 0.5 mg by mouth at bedtime.    aspirin EC 81 MG tablet Take 81 mg by mouth every morning.    citalopram (CELEXA) 40 MG tablet Take 1 tablet (40 mg total) by mouth every evening. Qty: 30 tablet, Refills: 1    glipiZIDE (GLUCOTROL) 5 MG tablet Take 1 tablet (5 mg total) by mouth 2 (two) times daily before a meal. Qty: 60 tablet, Refills: 5    losartan (COZAAR) 50 MG tablet Take 1 tablet (50 mg total) by mouth every morning. Qty: 30 tablet, Refills: 5    metoprolol succinate (TOPROL-XL) 100 MG 24 hr tablet Take 1 tablet (100 mg total) by mouth every morning. Qty: 30 tablet, Refills: 5    traMADol (ULTRAM) 50 MG tablet Take 50 mg by mouth every 6 (six) hours as needed for moderate pain.    vitamin B-12 (CYANOCOBALAMIN) 500 MCG tablet Take 500 mcg by mouth daily.      STOP taking these medications     cephALEXin (KEFLEX) 500 MG capsule      ondansetron (ZOFRAN) 4 MG tablet      sorbitol 70 % SOLN        Allergies  Allergen Reactions  . Sulfonamide Derivatives Anaphylaxis  . Chantix [Varenicline] Other (See Comments)    Made her angry, nausea, made blood pressure go up   Follow-up Information   Follow up with Jamesetta So, MD. (As needed)    Specialty:  General Surgery   Contact information:   [address] [place] [address] [phone]       Follow up with Manus Rudd, MD. Schedule an appointment as soon as possible for a visit in 1 week.   Specialty:  Gastroenterology   Contact information:   [place] [address] [place]  [address] [phone]       Follow up with Arnette Norris, MD. Schedule an appointment as soon as possible for a visit in 2 weeks.   Specialty:  Family Medicine   Contact information:  [place] [address] [phone]        The results of significant diagnostics from this hospitalization (including imaging, microbiology, ancillary and laboratory) are listed below for reference.    Significant Diagnostic Studies: Dg Chest 2 View  10/27/2013   CLINICAL DATA:  Shortness of breath 2 days. History of lung cancer as well as non-Hodgkin's lymphoma.  EXAM: CHEST  2 VIEW  COMPARISON:  11/28/2012 and chest CT 10/07/2013  FINDINGS: Lungs are adequately inflated without consolidation or effusion. Cardiomediastinal silhouette and remainder of the exam is unchanged.  IMPRESSION: No active cardiopulmonary disease.   Electronically Signed   By: Marin Olp M.D.   On: 10/27/2013 19:38   Ct Chest W Contrast  10/07/2013   CLINICAL DATA:  Lung cancer followup. History of non-Hodgkin's lymphoma.  EXAM: CT CHEST WITH CONTRAST  TECHNIQUE: Multidetector CT imaging of the chest was performed during intravenous contrast administration.  CONTRAST:  14mL OMNIPAQUE IOHEXOL 300 MG/ML  SOLN  COMPARISON:  None.  FINDINGS: No pleural effusion identified. Mild changes of centrilobular emphysema noted. Subpleural interstitial coarsening is identified within the lingula and left lower lobe. This is similar to previous exam and is favored to represent changes from external beam radiation, image 39/series 5. The right upper lobe pulmonary nodule is unchanged measuring 4 mm, image 21/series 5. No new or enlarging pulmonary nodules identified.  The heart size is normal. No pericardial effusion. Calcified atherosclerotic disease involves the thoracic aorta as well as the LAD coronary artery. There is no mediastinal or hilar adenopathy. The esophagus appears within normal limits. No enlarged axillary or supraclavicular  adenopathy.  Incidental imaging through the upper abdomen is unremarkable.  Review of the visualized osseous structures is negative for aggressive lytic or sclerotic bone lesions.  IMPRESSION: 1. Stable CT of the chest. No suspicious findings or evidence of metastatic disease. 2. Similar appearance of subpleural interstitial reticulation and scarring within the lingula and left lower lobe which likely reflects changes of external beam radiation. 3. Emphysema 4. Stable right upper lobe nodule 5. Atherosclerotic disease.   Electronically Signed   By: Kerby Moors M.D.   On: 10/07/2013 12:19   Ct Angio Chest Pe W/cm &/or Wo Cm  10/30/2013   CLINICAL DATA:  Hypoxia and chest pain.  EXAM: CT ANGIOGRAPHY CHEST WITH CONTRAST  TECHNIQUE: Multidetector CT imaging of the chest was performed using the standard protocol during bolus administration of intravenous contrast. Multiplanar CT image reconstructions and MIPs were obtained to evaluate the vascular anatomy.  CONTRAST:  167mL OMNIPAQUE IOHEXOL 350 MG/ML SOLN  COMPARISON:  10/07/2013.  FINDINGS: Chest wall:  No breast masses, supraclavicular or axillary lymphadenopathy. The bony thorax is intact. No destructive bone lesions or spinal canal compromise. There are a few small scattered benign-appearing bone islands and there are small thoracic disc protrusions with nuclear trail signs.  Mediastinum:  The heart is normal in size. No pericardial effusion. No mediastinal or hilar mass or adenopathy. Small scattered lymph nodes are stable. The aorta is normal in caliber. No dissection. Stable moderate atherosclerotic calcifications. Stable coronary artery calcifications. The esophagus is grossly normal.  The pulmonary arterial tree is well opacified. No filling defects to suggest pulmonary emboli.  Examination of the lung parenchyma demonstrates stable bibasilar subpleural scarring changes in atelectasis. No worrisome pulmonary lesions. Stable 4 mm left upper lobe pulmonary  nodule on image number 32. Moderate emphysematous changes are again noted. No pleural effusion.  The upper abdomen is unremarkable. Small  calcified granulomas are noted in the spleen.  Review of the MIP images confirms the above findings.  IMPRESSION: No CT findings for pulmonary embolism.  Stable moderate atherosclerotic calcifications involving the aorta branch vessels.  Stable basilar scarring changes in atelectasis. Stable emphysematous changes.   Electronically Signed   By: Kalman Jewels M.D.   On: 10/30/2013 15:22   Ct Abdomen Pelvis W Contrast  10/27/2013   CLINICAL DATA:  Lower abdominal pain and nausea. History of appendectomy and hysterectomy.  EXAM: CT ABDOMEN AND PELVIS WITH CONTRAST  TECHNIQUE: Multidetector CT imaging of the abdomen and pelvis was performed using the standard protocol following bolus administration of intravenous contrast.  CONTRAST:  119mL OMNIPAQUE IOHEXOL 300 MG/ML SOLN, 68mL OMNIPAQUE IOHEXOL 300 MG/ML SOLN  COMPARISON:  11/28/2012  FINDINGS: The liver, gallbladder, pancreas, spleen and adrenal glands are within normal limits. Tiny nonobstructing calculus is identified in the lower pole of the left kidney. No hydronephrosis.  Bowel shows no evidence of obstruction or inflammation. No free air, free fluid or abscess is identified.  The bladder is unremarkable. The uterus has been removed. No hernias are identified. No soft tissue masses or enlarged lymph nodes are identified.  The abdominal aorta and iliac arteries are heavily calcified. No evidence of aneurysmal disease. Bony structures are unremarkable.  IMPRESSION: No acute findings in the abdomen or pelvis. Tiny nonobstructive lower pole calculus in the left kidney.   Electronically Signed   By: Aletta Edouard M.D.   On: 10/27/2013 11:34   Nm Hepato W/eject Fract  11/01/2013   CLINICAL DATA:  Upper abdominal pain with nausea and vomiting  EXAM: NUCLEAR MEDICINE HEPATOBILIARY IMAGING WITH GALLBLADDER EF  Views:  Anterior  right upper quadrant  Radionuclide:  Technetium 18m mebrofenin  Dose:  5.0 mCi  Route of administration: Intravenous  COMPARISON:  None.  FINDINGS: Liver uptake of radiotracer is normal. There is prompt visualization of gallbladder and small bowel, indicating patency of the cystic and common bile ducts. A weight based dose, 1.16 mcg, of CCK was administered intravenously with calculation of the computer generated ejection fraction of radiotracer from the gallbladder. The patient experienced fairly significant abdominal pain with the CCK administration. The ejection fraction of radiotracer from the gallbladder is diminished at 28%, normal greater than 38%.  IMPRESSION: Abnormally low ejection fraction of radiotracer from the gallbladder, a finding concerning for biliary dyskinesis. Patient did experience pain with CCK administration. Cystic and common bile ducts are patent as is evidenced by visualization of gallbladder and small bowel.   Electronically Signed   By: Lowella Grip M.D.   On: 11/01/2013 09:45   Dg Chest Port 1 View  11/01/2013   CLINICAL DATA:  Shortness of breath, cough.  CHF and COPD.  EXAM: PORTABLE CHEST - 1 VIEW  COMPARISON:  10/30/2013  FINDINGS: Heart size is normal. There are no focal consolidations or pleural effusions. No pulmonary edema.  IMPRESSION: No evidence for acute  abnormality.   Electronically Signed   By: Shon Hale M.D.   On: 11/01/2013 17:31   Dg Chest Port 1 View  10/30/2013   CLINICAL DATA:  Chest pain and shortness of breath. History of non-Hodgkin's lymphoma and radiation therapy.  EXAM: PORTABLE CHEST - 1 VIEW  COMPARISON:  10/27/2013  FINDINGS: Normal heart size and pulmonary vascularity. Suggestion of mild increased interstitial change in the lungs which may indicate interstitial pneumonia or edema. Interval blunting of left costophrenic angle suggesting developing small pleural effusion. No focal consolidation.  IMPRESSION: Developing interstitial edema and  small left pleural effusion.   Electronically Signed   By: Lucienne Capers M.D.   On: 10/30/2013 05:59   US Abdomen Limited Ruq  10/30/2013   CLINICAL DATA:  Nausea, vomiting, epigastric abdominal pain.  EXAM: US ABDOMEN LIMITED - RIGHT UPPER QUADRANT  COMPARISON:  None.  FINDINGS: Gallbladder:  The gallbladder is underdistended. There is a knee ill-defined echogenic structure within the neck of the gallbladder which measures approximately 0.7 x 0.4 x 0.5 cm (image 82). No definite gallbladder wall thickening given under distention. No pericholecystic fluid. Negative sonographic Murphy's sign.  Common bile duct:  Diameter: Normal in size measuring 2.5 mm in diameter  Liver:  There is diffuse increased echogenicity of the hepatic parenchyma suggestive of hepatic steatosis. No discrete hepatic lesions. No intrahepatic biliary duct dilatation. No ascites.  IMPRESSION: 1. Suspected approximately 7 mm stone within the neck of an underdistended but otherwise normal-appearing gallbladder. If concern persists for acute cholecystitis, further evaluation could be performed with a HIDA scan as clinically indicated. 2. Suspected hepatic steatosis.   Electronically Signed   By: Sandi Mariscal M.D.   On: 10/30/2013 16:15    Microbiology: Recent Results (from the past 240 hour(s))  URINE CULTURE     Status: None   Collection Time    10/27/13  9:26 AM      Result Value Ref Range Status   Specimen Description URINE, CLEAN CATCH   Final   Special Requests NONE   Final   Culture  Setup Time     Final   Value: 10/27/2013 23:06     Performed at [facility] Count     Final   Value: NO GROWTH     Performed at [facility]   Culture     Final   Value: NO GROWTH     Performed at [facility]   Report Status 10/28/2013 FINAL   Final     Labs: Basic Metabolic Panel:  Recent Labs Lab 10/28/13 0504 10/30/13 0549 10/31/13 0140 11/01/13 0546 11/02/13 0607  NA 138 141 141 142 141   K 4.3 4.1 3.8 3.7 4.3  CL 100 102 99 97 98  CO2 26 27 31  33* 34*  GLUCOSE 282* 234* 173* 188* 162*  BUN 11 17 16  24* 26*  CREATININE 0.69 0.69 0.87 1.11* 1.04  CALCIUM 8.5 8.9 9.0 9.3 9.3   Liver Function Tests:  Recent Labs Lab 10/27/13 0955 10/28/13 0504 10/30/13 1434 11/01/13 0546  AST 13 13 16 13   ALT 10 10 14 15   ALKPHOS 86 79 81 91  BILITOT 0.6 0.2* 0.5 0.4  PROT 7.1 6.5 6.4 6.1  ALBUMIN 3.9 3.5 3.4* 3.2*    Recent Labs Lab 10/27/13 0955 10/28/13 0504 10/30/13 1434  LIPASE 17 22 15    No results found for this basename: AMMONIA,  in the last 168 hours CBC:  Recent Labs Lab 10/27/13 0955 10/27/13 1822 10/28/13 0504 10/30/13 0549 10/31/13 0140 11/01/13 0546  WBC 8.5 8.7 6.6 18.5* 9.8 7.4  NEUTROABS 5.7 6.1  --  15.4*  --   --   HGB 14.8 14.2 13.4 13.5 13.5 13.6  HCT 42.9 41.8 40.8 39.2 39.2 40.4  MCV 90.9 92.3 94.2 91.6 91.4 91.8  PLT 284 272 261 261 246 261   Cardiac Enzymes:  Recent Labs Lab 10/27/13 1822 10/30/13 0549 10/30/13 1430 10/30/13 2008 10/31/13 0140  TROPONINI <0.30 <0.30 <0.30 <0.30 <0.30  BNP: BNP (last 3 results)  Recent Labs  11/28/12 1330 10/30/13 0549  PROBNP 302.2 3093.0*   CBG:  Recent Labs Lab 11/01/13 1656 11/01/13 2129 11/02/13 0735 11/02/13 1149 11/02/13 1657  GLUCAP 221* 164* 155* 255* 161*       Signed:  Margee Trentham  [facility] 11/02/2013, 6:49 PM

## 2013-11-02 NOTE — Progress Notes (Signed)
Late entry for 11/02/13 at 2007, patient discharged home in NAD, IV removed, discharge instructions were discussed with patient today, Huey Romans arrived with patients portable O2, transported via wheelchair.

## 2013-11-02 NOTE — Progress Notes (Signed)
Resting room air O2 Sat 90%. Ambulating room air O2 Sat 86%. Ambulating with O2 at 2 liters by [place] 95%.

## 2013-11-02 NOTE — Progress Notes (Signed)
Utilization review Completed Anaijah Augsburger RN BSN   

## 2013-11-04 NOTE — Care Management Note (Signed)
Page 1 of 2   11/04/2013     3:44:35 PM          Patient Information    Patient Name Sex DOB SSN   Marie Holmes Female 01/19/1951 [id number]            H&P by Carole Civil, MD at 10/29/2013 1:02 PM    Author: Carole Civil, MD  Service: Orthopedics  Author Type: Physician    Filed: 10/29/2013 1:05 PM  Note Time: 10/29/2013 1:02 PM  Status: Signed    Editor: Carole Civil, MD (Physician)         Chief Complaint                  Patient presents with                    Follow-up                   Pain left knee.                HISTORY  Marie Holmes complains of chronic left knee pain; today we will reevaluate her for possible left knee replacement. She also has osteoarthritis and limited motion in the right knee although not painful at this time. I first saw her for that about 15 years ago but at that time she was too young for total knee surgery.  Her left knee deformity is progressing. Her pain is increasing. Her ability to walk is decreasing. Her activities of daily living are more difficult. She is on oxycodone 5 for chronic pain.  We discussed her possibilities related to knee replacement surgery I addressed the following concerns. #1 chronic pain in her ability to perform rehabilitation. Chronic pain management and pain management after surgery. She is mentally preparing now to have to fight through the pain she will have in the postop period however, she says that her knee is so bad that she is willing to undergo the risks of the surgery.                  Past Medical History                  Diagnosis            Date             Cystitis                   Pain in both knees                   Thyroid disease                   Low back pain                   Spondylosis without myelopathy                      NOS               Hypertension                   Foot pain                      bilateral               Hyperthyroidism  GERD  (gastroesophageal reflux disease)                   PONV (postoperative nausea and vomiting)                   Sleep apnea                      STOP BANG: 5               Restless leg syndrome                   Breast cancer       2010                left               Hypertension                   Hypothyroidism                   GERD (gastroesophageal reflux disease)                   Anxiety             Past Surgical History                  Procedure           Laterality   Date           Partial hysterectory for fibroids                   Left elbow open treatment internal fixation                      Dr. Aline Brochure               Left foot arch / calcaneal osteotomy with if snd bunion and hammer toe                      Dr. Dorthula Rue , [place]               Mastectomy                      left breast               Port-a-cath removal         06/24/2011              Procedure: MINOR REMOVAL PORT-A-CATH; Surgeon: Donato Heinz, MD; Location: [facility]; Service: General; Laterality: N/A; book in minor room               Hardware removal         09/15/2011              Procedure: HARDWARE REMOVAL; Surgeon: Marcheta Grammes, DPM; Location: [facility]; Service: Orthopedics; Laterality: Left; Removal of Retained Hardware Left Foot               Hammer toe surgery         09/15/2011              Procedure: HAMMER TOE CORRECTION; Surgeon: Marcheta Grammes, DPM; Location: [facility]; Service: Orthopedics; Laterality: Left; Hammer Toe Repair Digits 3-5 Left Foot               Foot  surgery         09/15/2011              removal of pins from 3 left toes and screws from from left foot               Abdominal hysterectomy              Family History                  Problem        Relation       Age of Onset         Coronary artery disease     Mother               Hypertension     Mother               Stroke     Mother               Hypertension     Sister               Cancer     Brother                  intestinal                Cancer     Maternal Aunt                 breast            History       Substance Use Topics         Smoking status:  Never Smoker       Smokeless tobacco:  Never Used       Alcohol Use:  No     Current facility-administered medications:[START ON 10/30/2013] tranexamic acid (CYKLOKAPRON) 1,000 mg in sodium chloride 0.9 % 100 mL IVPB, 1,000 mg, Intravenous, To OR, Carole Civil, MD  Current outpatient prescriptions:ALPRAZolam (XANAX) 1 MG tablet, TAKE 1 TABLET BY MOUTH AT BEDTIME., Disp: 30 tablet, Rfl: 3; aspirin EC 81 MG tablet, Take 81 mg by mouth every morning. , Disp: , Rfl: ; Calcium Carbonate-Vitamin D (CALCIUM 600/VITAMIN D) 600-400 MG-UNIT per tablet, Take 1 tablet by mouth daily., Disp: , Rfl: ; Carboxymethylcellulose Sodium (LUBRICANT EYE DROPS OP), Apply 1 drop to eye as needed., Disp: , Rfl:  celecoxib (CELEBREX) 200 MG capsule, Take 200 mg by mouth daily., Disp: , Rfl: ; Dexlansoprazole (DEXILANT) 30 MG capsule, Take 30 mg by mouth every morning. , Disp: , Rfl: ; Docusate Calcium (STOOL SOFTENER PO), Take 1 capsule by mouth as needed., Disp: , Rfl: ; gabapentin (NEURONTIN) 300 MG capsule, Take 1 capsule (300 mg total) by mouth 3 (three) times daily., Disp: 270 capsule, Rfl: 1  ibuprofen (ADVIL,MOTRIN) 200 MG tablet, Take 400 mg by mouth every 6 (six) hours as needed. For pain, Disp: , Rfl: ; loratadine (CLARITIN) 10 MG tablet, TAKE ONE TABLET BY MOUTH ONCE DAILY., Disp: 30 tablet, Rfl: 4; methimazole (TAPAZOLE) 5 MG tablet, TAKE 1 TABLET BY MOUTH ONCE A DAY FOR THYROID., Disp: 30 tablet, Rfl: 3; omega-3 acid ethyl esters (LOVAZA) 1 G capsule, Take 2 g by mouth 2 (two) times daily. , Disp: , Rfl:  oxyCODONE-acetaminophen (ROXICET) 5-325 MG per tablet, Take 1 tablet by mouth every 6 (six) hours as needed., Disp: 120 tablet, Rfl: 0; polyethylene glycol powder (GLYCOLAX/MIRALAX) powder, Take 17 g by mouth daily  as needed. For constipation, Disp: , Rfl:  ; tiZANidine (ZANAFLEX) 4 MG tablet, Take 1 tablet (4 mg total) by mouth 2 (two) times daily as needed for muscle spasms., Disp: 60 tablet, Rfl: 2  triamterene-hydrochlorothiazide (MAXZIDE-25) 37.5-25 MG per tablet, TAKE 1 TABLET BY MOUTH ONCE A DAY., Disp: 30 tablet, Rfl: 3  ROS: Review of systems is as follows no fever chills some fatigue no blurred vision no headaches no chest pain no shortness of breath no heartburn no urinary difficulties multiple joint pain is noted no skin changes unsteady gait is noted no hallucinations no easy bleeding no excessive thirst and no recent environmental reactions.  PHYSICAL EXAM  VS: BP 134/71  Ht 5\' 11"  (1.803 m)  Wt 196 lb (88.905 kg)  BMI 27.35 kg/m2  General appearance overall body habitus is normal she has valgus alignment problem and varus alignment problems of the left and right knee respectively  The patient is oriented to person place and time  Mood and affect are normal  The ambulatory status shows: Overall poor ambulatory status. She has a windswept deformity to the left with a valgus left knee and a varus right knee  Her extremities show multiple areas of arthritic nodules in the hands no ulnar drift however. The shoulder elbow and wrist joints of the upper extremities:  Inspection reveals no abnormalities and palpation reveals no tenderness  All joints have full range of motion  And all joints are stable  Muscle tone is normal  The skin is without rash lesion ulceration or bruising  Radial and ulnar pulses are intact and normal at 2+ to palpation  Lymph nodes are normal without swelling  Normal sensation is noted throughout the extremities  There are no pathologic reflexes  Coordination is normal  The right knee is tender and has small effusion as well. Joint motion is limited to 90 of flexion. Knee and hip are stable. Muscle tone normal. Skin without rashes or ulceration or bruising. Dorsalis pedis pulse intact. Chronic flatfoot deformity.  Groin lymph nodes are normal without swelling. Normal sensation is noted in her right lower extremity without pathologic reflexes and balance is normal.  The left knee flexion is surprisingly 115 with hyperextension noted. Overall range of motion 120. She is tender in the lateral compartment with a strongly deformed valgus knee. Pseudo-laxity medial collateral ligaments but stable. Stable in anterior posterior position. Muscle tone and strength in the quadriceps normal. Previous scar noted medial superior aspect of the knee from a laceration as a child. Skin is without rash lesion or ulceration otherwise. Her pulses are intact. She has no lymph node swelling. Normal sensation. Reflexes normal. Coordination normal.  I ordered some x-rays on her today she has a severe valgus osteoarthritis of the knee proximally 19 of valgus.  Dx:  #1 osteoarthritis left knee with valgus deformity  Plan we will schedule her for left total knee arthroplasty and we did discuss possibility of peroneal nerve palsy postop  Plan we also discussed possibility of needing pain management postop          CARE MANAGEMENT NOTE 11/04/2013  Patient:  Marie Holmes, Marie Holmes   Account Number:  [id number]  Date Initiated:  11/01/2013  Documentation initiated by:  Vladimir Creeks  Subjective/Objective Assessment:   Pt admitted with COPD and CHF. She is from home and will be returning home at D/C. She lives with her daughter.     Action/Plan:   She will use [facility] for [facility] if needed and [employer] for  O2 if needed, and RN notified, in case pt goes over the weekend   Anticipated DC Date:  11/02/2013   Anticipated DC Plan:  [place]  CM consult      [place]   Choice offered to / List presented to:  C-1 Patient        [place] arranged  HH-1 RN      [place].   Status of service:  Completed, signed off Medicare Important Message given?  YES (If response  is "NO", the following Medicare IM given date fields will be blank) Date Medicare IM given:  11/01/2013 Medicare IM given by:  Vladimir Creeks Date Additional Medicare IM given:   Additional Medicare IM given by:    Discharge Disposition:  [place]  Per UR Regulation:  Reviewed for med. necessity/level of care/duration of stay  If discussed at [place] of Stay Meetings, dates discussed:    Comments:  11/04/13  [place] RN/CM

## 2013-11-08 ENCOUNTER — Ambulatory Visit (INDEPENDENT_AMBULATORY_CARE_PROVIDER_SITE_OTHER): Payer: Commercial Managed Care - HMO | Admitting: Family Medicine

## 2013-11-08 ENCOUNTER — Encounter: Payer: Self-pay | Admitting: Family Medicine

## 2013-11-08 VITALS — BP 102/68 | HR 84 | Temp 97.8°F | Wt 128.5 lb

## 2013-11-08 DIAGNOSIS — J9600 Acute respiratory failure, unspecified whether with hypoxia or hypercapnia: Secondary | ICD-10-CM

## 2013-11-08 DIAGNOSIS — J449 Chronic obstructive pulmonary disease, unspecified: Secondary | ICD-10-CM

## 2013-11-08 DIAGNOSIS — E785 Hyperlipidemia, unspecified: Secondary | ICD-10-CM

## 2013-11-08 DIAGNOSIS — I10 Essential (primary) hypertension: Secondary | ICD-10-CM

## 2013-11-08 DIAGNOSIS — Z23 Encounter for immunization: Secondary | ICD-10-CM

## 2013-11-08 DIAGNOSIS — K8020 Calculus of gallbladder without cholecystitis without obstruction: Secondary | ICD-10-CM

## 2013-11-08 DIAGNOSIS — J9601 Acute respiratory failure with hypoxia: Secondary | ICD-10-CM

## 2013-11-08 DIAGNOSIS — I509 Heart failure, unspecified: Secondary | ICD-10-CM

## 2013-11-08 DIAGNOSIS — K8050 Calculus of bile duct without cholangitis or cholecystitis without obstruction: Secondary | ICD-10-CM

## 2013-11-08 DIAGNOSIS — E119 Type 2 diabetes mellitus without complications: Secondary | ICD-10-CM

## 2013-11-08 DIAGNOSIS — R110 Nausea: Secondary | ICD-10-CM

## 2013-11-08 LAB — COMPREHENSIVE METABOLIC PANEL
ALK PHOS: 62 U/L (ref 39–117)
ALT: 17 U/L (ref 0–35)
AST: 16 U/L (ref 0–37)
Albumin: 3.9 g/dL (ref 3.5–5.2)
BUN: 23 mg/dL (ref 6–23)
CALCIUM: 9.7 mg/dL (ref 8.4–10.5)
CHLORIDE: 94 meq/L — AB (ref 96–112)
CO2: 29 mEq/L (ref 19–32)
Creatinine, Ser: 1.2 mg/dL (ref 0.4–1.2)
GFR: 47.26 mL/min — ABNORMAL LOW (ref >60.00)
Glucose, Bld: 213 mg/dL — ABNORMAL HIGH (ref 70–99)
POTASSIUM: 4.2 meq/L (ref 3.5–5.1)
Sodium: 135 mEq/L (ref 135–145)
Total Bilirubin: 0.8 mg/dL (ref 0.2–1.2)
Total Protein: 6.9 g/dL (ref 6.0–8.3)
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
## 2013-11-08 NOTE — Assessment & Plan Note (Signed)
Resolved. No indication of volume overload on exam. Likely due to IVFs given during previous admission. Call or return to clinic prn if these symptoms worsen or fail to improve as anticipated.

## 2013-11-08 NOTE — Patient Instructions (Signed)
Great to see you. Please stop by to see Marie Holmes on your way out to discuss your referral to surgeon.

## 2013-11-08 NOTE — Progress Notes (Signed)
Pre visit review using our clinic review tool, if applicable. No additional management support is needed unless otherwise documented below in the visit note. 

## 2013-11-08 NOTE — Assessment & Plan Note (Signed)
Stable now. No changes made to rx.

## 2013-11-08 NOTE — Assessment & Plan Note (Signed)
Resolved. Advised her to continue having home health RN check her O2 sats.  If they remain stable, agree with using O2 prn only.

## 2013-11-08 NOTE — Assessment & Plan Note (Signed)
Persistent with ?gallstones and biliary dyskinesis. Refer to gen surg to consider cholecystectomy. Repeat CMET and CBC today.

## 2013-11-08 NOTE — Addendum Note (Signed)
Addended by: Modena Nunnery on: 11/08/2013 11:05 AM   Modules accepted: Orders

## 2013-11-11 ENCOUNTER — Other Ambulatory Visit (INDEPENDENT_AMBULATORY_CARE_PROVIDER_SITE_OTHER): Payer: Self-pay | Admitting: Surgery

## 2013-11-11 NOTE — H&P (Signed)
Marie Holmes is an 78 y.o. female.   Chief Complaint: ABDOMINAL PAIN FOR MONTHS HPI: 3 MONTH HX RUQ PAIN ABDOMEN CHRONIC SHARP MADE WORSE WITH FATTY FOODS.  COURSE INTERMITTENT.    NAUSEA AND VOMITING AT TIMES. U/S SHOWS GALLSTONE AND ABNORMAL EMPTYING OF GALLBLADDER.   Past Medical History  Diagnosis Date  . Benign tumor of breast 2004    left  . Broken ribs     history of several broken ribs on right and compression  fx  . Fibroma of skin     Multiple fibromas/lipomas on arms and legs B  . Pyelonephritis   . Anxiety   . COPD (chronic obstructive pulmonary disease)   . Hyperlipidemia   . Osteoarthritis   . History of CT scan of abdomen 05/23/2003    Mild fatty liver inf, tiny hypodensity in right lobe of liver  . Kidney stone 02/21/1981    Right/removed  . Pulmonary nodules   . Diabetes mellitus without complication   . Hx of radiation therapy 04/11/12- 05/08/12    left chest wall area, 40 gray 20 fx  . Hypertension   . Diverticulosis   . Adenomatous colon polyp   . Cancer     non-hodgkins b cell lymphoma  . 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 CHF (congestive heart failure)     Past Surgical History  Procedure Laterality Date  . Appendectomy  02/21/1961  . Exploratory laparotomy  02/21/1970    for ? adhesions  . Abdominal hysterectomy  02/21/1970    right ovary remaining  . Bone marrow biopsy  03/09/2012  . Lung core biopsy  02/06/2012    Family History  Problem Relation Age of Onset  . Parkinsonism Brother   . Stroke Father   . Cancer Sister   . Heart disease Brother   . Clotting disorder Father   . Kidney disease Mother    Social History:  reports that she quit smoking about 2 weeks ago. Her smoking use included Cigarettes. She has a 15 pack-year smoking history. She has never used smokeless tobacco. She reports that she does not drink alcohol or use illicit drugs.  Allergies:  Allergies  Allergen  Reactions  . Sulfonamide Derivatives Anaphylaxis  . Chantix [Varenicline] Other (See Comments)    Made her angry, nausea, made blood pressure go up     (Not in a hospital admission)  No results found for this or any previous visit (from the past 48 hour(s)). No results found.  Review of Systems  Constitutional: Negative.   HENT: Negative.   Cardiovascular: Negative.   Gastrointestinal: Positive for nausea and abdominal pain.    There were no vitals taken for this visit. Physical Exam  Constitutional: She is oriented to person, place, and time. She appears well-developed and well-nourished.  GI: She exhibits distension. There is no tenderness.    Musculoskeletal: Normal range of motion.  Neurological: She is alert and oriented to person, place, and time.  Skin: Skin is warm and dry.     Assessment/Plan Chronic cholecystitis/   BD by HIDA The procedure has been discussed with the patient. Operative and non operative treatments have been discussed. Risks of surgery include bleeding, infection,  Common bile duct injury,  Injury to the stomach,liver, colon,small intestine, abdominal wall,  Diaphragm,  Major blood vessels,  And the need for an open procedure.  Other risks include worsening of medical problems, death,  DVT and pulmonary  embolism, and cardiovascular events.   Medical options have also been discussed. The patient has been informed of long term expectations of surgery and non surgical options,  The patient agrees to proceed.    Kama Cammarano A. 11/11/2013, 9:36 AM

## 2013-11-12 ENCOUNTER — Telehealth: Payer: Self-pay | Admitting: Family Medicine

## 2013-11-12 NOTE — Telephone Encounter (Signed)
Yes I checked CMET.  Last CBC in chart was normal- it had returned to normal prior to her discharge.

## 2013-11-12 NOTE — Telephone Encounter (Signed)
Marie Holmes is calling to check mom's WBC level from 11/08/13 blood draw; checked epic and only a CMP was drawn; informed her that Dr.Aron had reviewed and said it looked stable

## 2013-11-14 ENCOUNTER — Telehealth: Payer: Self-pay

## 2013-11-14 NOTE — Telephone Encounter (Signed)
That would be up to the patient, if she feels safe to drive and if she is not taking anything that makes her drowsy.

## 2013-11-14 NOTE — Telephone Encounter (Signed)
Tabitha nurse with [facility] left v/m; pt was seen 11/08/13 and pt wants to know if it is OK if pt drives. Tabitha request cb.

## 2013-11-14 NOTE — Telephone Encounter (Signed)
Lm on Marie Holmes's vm advising per Dr Deborra Medina; informed to contact office back should she have any additional questions

## 2013-11-18 ENCOUNTER — Ambulatory Visit: Payer: Commercial Managed Care - HMO | Admitting: Family Medicine

## 2013-11-18 DIAGNOSIS — J441 Chronic obstructive pulmonary disease with (acute) exacerbation: Secondary | ICD-10-CM

## 2013-11-18 DIAGNOSIS — I509 Heart failure, unspecified: Secondary | ICD-10-CM

## 2013-11-20 ENCOUNTER — Other Ambulatory Visit: Payer: Self-pay | Admitting: *Deleted

## 2013-11-20 MED ORDER — ALPRAZOLAM 0.5 MG PO TABS: 0.5000 mg | ORAL_TABLET | Freq: Every day | ORAL | Status: DC

## 2013-11-20 NOTE — Telephone Encounter (Signed)
Pt requesting medication refill. Last f/u appt 09/2013. Ok to fill per Dr Deborra Medina. Rx to be faxed to requested pharmacy by end of day, 11/21/13

## 2013-11-28 ENCOUNTER — Telehealth: Payer: Self-pay | Admitting: Family Medicine

## 2013-11-28 NOTE — Telephone Encounter (Signed)
Patient Information:  Caller Name: Seven  Phone: [phone]  Patient: Marie Holmes  Gender: Female  DOB: [birth year]  Age: 78 Years  PCP: Arnette Norris [facility])  Office Follow Up:  Does the office need to follow up with this patient?: No  Instructions For The Office: N/A  RN Note:  Patient reports she cannot get to the office today due to transportation. She would prefer to get instructions over the phone if possible.   Symptoms  Reason For Call & Symptoms: Nasal congestion, severe productive cough with yellow sputum. Also reports sore throat started in the beginning. Feels she may have a fever, but the thermometer is messed up. Left ear pain at times.  Reviewed Health History In EMR: Yes  Reviewed Medications In EMR: Yes  Reviewed Allergies In EMR: Yes  Reviewed Surgeries / Procedures: Yes  Date of Onset of Symptoms: 11/27/2013  Guideline(s) Used:  Colds  Disposition Per Guideline:   See Today in Office  Reason For Disposition Reached:   Earache  Advice Given:  N/A  Patient Will Follow Care Advice:  YES

## 2013-11-29 ENCOUNTER — Encounter (HOSPITAL_COMMUNITY): Payer: Self-pay | Admitting: Pharmacy Technician

## 2013-12-02 ENCOUNTER — Other Ambulatory Visit: Payer: Self-pay | Admitting: Family Medicine

## 2013-12-02 ENCOUNTER — Encounter: Payer: Self-pay | Admitting: Internal Medicine

## 2013-12-02 ENCOUNTER — Ambulatory Visit (INDEPENDENT_AMBULATORY_CARE_PROVIDER_SITE_OTHER): Payer: Commercial Managed Care - HMO | Admitting: Internal Medicine

## 2013-12-02 VITALS — BP 126/62 | HR 96 | Temp 98.1°F | Wt 124.0 lb

## 2013-12-02 DIAGNOSIS — J441 Chronic obstructive pulmonary disease with (acute) exacerbation: Secondary | ICD-10-CM

## 2013-12-02 MED ORDER — DOXYCYCLINE HYCLATE 100 MG PO TABS: 100.0000 mg | ORAL_TABLET | Freq: Two times a day (BID) | ORAL | Status: DC

## 2013-12-02 MED ORDER — PREDNISONE 10 MG PO TABS: ORAL_TABLET | ORAL | Status: DC

## 2013-12-02 NOTE — Patient Instructions (Signed)
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
## 2013-12-02 NOTE — Progress Notes (Signed)
Pre visit review using our clinic review tool, if applicable. No additional management support is needed unless otherwise documented below in the visit note. 

## 2013-12-04 ENCOUNTER — Encounter (HOSPITAL_COMMUNITY)
Admission: RE | Admit: 2013-12-04 | Discharge: 2013-12-04 | Disposition: A | Discharge status: Home / Self Care | Payer: Commercial Managed Care - HMO | Source: Ambulatory Visit | Attending: Surgery | Admitting: Surgery

## 2013-12-04 ENCOUNTER — Encounter (HOSPITAL_COMMUNITY): Payer: Self-pay

## 2013-12-04 DIAGNOSIS — I10 Essential (primary) hypertension: Secondary | ICD-10-CM | POA: Diagnosis not present

## 2013-12-04 DIAGNOSIS — I509 Heart failure, unspecified: Secondary | ICD-10-CM | POA: Diagnosis not present

## 2013-12-04 DIAGNOSIS — Z87891 Personal history of nicotine dependence: Secondary | ICD-10-CM | POA: Insufficient documentation

## 2013-12-04 DIAGNOSIS — M158 Other polyosteoarthritis: Secondary | ICD-10-CM | POA: Diagnosis not present

## 2013-12-04 DIAGNOSIS — I083 Combined rheumatic disorders of mitral, aortic and tricuspid valves: Secondary | ICD-10-CM | POA: Insufficient documentation

## 2013-12-04 DIAGNOSIS — J449 Chronic obstructive pulmonary disease, unspecified: Secondary | ICD-10-CM | POA: Diagnosis not present

## 2013-12-04 DIAGNOSIS — Z86010 Personal history of colonic polyps: Secondary | ICD-10-CM | POA: Diagnosis not present

## 2013-12-04 DIAGNOSIS — Z923 Personal history of irradiation: Secondary | ICD-10-CM | POA: Insufficient documentation

## 2013-12-04 DIAGNOSIS — R918 Other nonspecific abnormal finding of lung field: Secondary | ICD-10-CM | POA: Diagnosis not present

## 2013-12-04 DIAGNOSIS — M810 Age-related osteoporosis without current pathological fracture: Secondary | ICD-10-CM | POA: Insufficient documentation

## 2013-12-04 DIAGNOSIS — Z0000 Encounter for general adult medical examination without abnormal findings: Secondary | ICD-10-CM | POA: Insufficient documentation

## 2013-12-04 DIAGNOSIS — F419 Anxiety disorder, unspecified: Secondary | ICD-10-CM | POA: Insufficient documentation

## 2013-12-04 DIAGNOSIS — K811 Chronic cholecystitis: Secondary | ICD-10-CM | POA: Diagnosis not present

## 2013-12-04 DIAGNOSIS — E119 Type 2 diabetes mellitus without complications: Secondary | ICD-10-CM | POA: Diagnosis not present

## 2013-12-04 DIAGNOSIS — K219 Gastro-esophageal reflux disease without esophagitis: Secondary | ICD-10-CM | POA: Diagnosis not present

## 2013-12-04 DIAGNOSIS — Z01818 Encounter for other preprocedural examination: Secondary | ICD-10-CM

## 2013-12-04 DIAGNOSIS — Z7982 Long term (current) use of aspirin: Secondary | ICD-10-CM | POA: Diagnosis not present

## 2013-12-04 DIAGNOSIS — C8590 Non-Hodgkin lymphoma, unspecified, unspecified site: Secondary | ICD-10-CM | POA: Diagnosis not present

## 2013-12-04 DIAGNOSIS — F329 Major depressive disorder, single episode, unspecified: Secondary | ICD-10-CM | POA: Diagnosis not present

## 2013-12-04 HISTORY — DX: Shortness of breath: R06.02

## 2013-12-04 HISTORY — DX: Major depressive disorder, single episode, unspecified: F32.9

## 2013-12-04 HISTORY — DX: Gastro-esophageal reflux disease without esophagitis: K21.9

## 2013-12-04 HISTORY — DX: Acute nasopharyngitis (common cold): J00

## 2013-12-04 HISTORY — DX: Depression, unspecified: F32.A

## 2013-12-04 LAB — CBC WITH DIFFERENTIAL/PLATELET
Basophils Absolute: 0 10*3/uL (ref 0.0–0.1)
Basophils Relative: 0 % (ref 0–1)
EOS ABS: 0 10*3/uL (ref 0.0–0.7)
Eosinophils Relative: 0 % (ref 0–5)
HCT: 40.3 % (ref 36.0–46.0)
HEMOGLOBIN: 13.9 g/dL (ref 12.0–15.0)
LYMPHS ABS: 1 10*3/uL (ref 0.7–4.0)
Lymphocytes Relative: 10 % — ABNORMAL LOW (ref 12–46)
MCH: 31.3 pg (ref 26.0–34.0)
MCHC: 34.5 g/dL (ref 30.0–36.0)
MCV: 90.8 fL (ref 78.0–100.0)
MONO ABS: 0.7 10*3/uL (ref 0.1–1.0)
Monocytes Relative: 7 % (ref 3–12)
NEUTROS PCT: 83 % — AB (ref 43–77)
Neutro Abs: 8.2 10*3/uL — ABNORMAL HIGH (ref 1.7–7.7)
Platelets: 290 10*3/uL (ref 150–400)
RBC: 4.44 MIL/uL (ref 3.87–5.11)
RDW: 13.1 % (ref 11.5–15.5)
WBC: 9.9 10*3/uL (ref 4.0–10.5)

## 2013-12-04 LAB — COMPREHENSIVE METABOLIC PANEL
ALBUMIN: 3.7 g/dL (ref 3.5–5.2)
ALT: 9 U/L (ref 0–35)
ANION GAP: 14 (ref 5–15)
AST: 11 U/L (ref 0–37)
Alkaline Phosphatase: 91 U/L (ref 39–117)
BUN: 14 mg/dL (ref 6–23)
CALCIUM: 10 mg/dL (ref 8.4–10.5)
CO2: 27 mEq/L (ref 19–32)
CREATININE: 0.75 mg/dL (ref 0.50–1.10)
Chloride: 101 mEq/L (ref 96–112)
GFR calc non Af Amer: 80 mL/min — ABNORMAL LOW (ref >90)
GLUCOSE: 206 mg/dL — AB (ref 70–99)
POTASSIUM: 4.1 meq/L (ref 3.7–5.3)
Sodium: 142 mEq/L (ref 137–147)
Total Bilirubin: 0.3 mg/dL (ref 0.3–1.2)
Total Protein: 7.2 g/dL (ref 6.0–8.3)

## 2013-12-04 NOTE — Progress Notes (Signed)
Call to A. Zelenak,PA-C, reported pt.'s recent hosp. Encounter for dyspnea, chf, etc. In [facility],; & current cold virus & medication that she was prescribed for the virus when she was seen by [facility] on 10/12. A. Zelenak,PA-C will see pt. Today.

## 2013-12-04 NOTE — Pre-Procedure Instructions (Signed)
Marie Holmes  12/04/2013   Your procedure is scheduled on:  12/11/2013  Report to [facility] Admitting at 11:30 AM.  Call this number if you have problems the morning of surgery: [phone]   Remember:   Do not eat food or drink liquids after midnight.  On Tuesday night    Take these medicines the morning of surgery with A SIP OF WATER: use inhaler &/or  Nebulizer if needed, mucinex, metoprolol, protonix    Do not wear jewelry, make-up or nail polish.  Do not wear lotions, powders, or perfumes. You may wear deodorant.  Do not shave 48 hours prior to surgery   Do not bring valuables to the hospital.  [facility] is not responsible   for any belongings or valuables.               Contacts, dentures or bridgework may not be worn into surgery.  Leave suitcase in the car. After surgery it may be brought to your room.  For patients admitted to the hospital, discharge time is determined by your                treatment team.               Patients discharged the day of surgery will not be allowed to drive  home.  Name and phone number of your driver: /w family  Special Instructions: Special Instructions: [facility] - Preparing for Surgery  Before surgery, you can play an important role.  Because skin is not sterile, your skin needs to be as free of germs as possible.  You can reduce the number of germs on you skin by washing with CHG (chlorahexidine gluconate) soap before surgery.  CHG is an antiseptic cleaner which kills germs and bonds with the skin to continue killing germs even after washing.  Please DO NOT use if you have an allergy to CHG or antibacterial soaps.  If your skin becomes reddened/irritated stop using the CHG and inform your nurse when you arrive at Short Stay.  Do not shave (including legs and underarms) for at least 48 hours prior to the first CHG shower.  You may shave your face.  Please follow these instructions carefully:   1.  Shower with CHG Soap  the night before surgery and the  morning of Surgery.  2.  If you choose to wash your hair, wash your hair first as usual with your  normal shampoo.  3.  After you shampoo, rinse your hair and body thoroughly to remove the  Shampoo.  4.  Use CHG as you would any other liquid soap.  You can apply chg directly to the skin and wash gently with scrungie or a clean washcloth.  5.  Apply the CHG Soap to your body ONLY FROM THE NECK DOWN.    Do not use on open wounds or open sores.  Avoid contact with your eyes, ears, mouth and genitals (private parts).  Wash genitals (private parts)   with your normal soap.  6.  Wash thoroughly, paying special attention to the area where your surgery will be performed.  7.  Thoroughly rinse your body with warm water from the neck down.  8.  DO NOT shower/wash with your normal soap after using and rinsing off   the CHG Soap.  9.  Pat yourself dry with a clean towel.            10.  Wear clean pajamas.  11.  Place clean sheets on your bed the night of your first shower and do not sleep with pets.  Day of Surgery  Do not apply any lotions/deodorants the morning of surgery.  Please wear clean clothes to the hospital/surgery center.   Please read over the following fact sheets that you were given: Pain Booklet, Coughing and Deep Breathing and Surgical Site Infection Prevention

## 2013-12-04 NOTE — Progress Notes (Addendum)
Anesthesia PAT Evaluation:  Patient is a 78 year old female scheduled for laparoscopic cholecystectomy on 12/11/13 by Dr. Brantley Stage.    History includes recent former smoker (quit 10/23/13), COPD, exertional dyspnea, pulmonary nodules, HTN, DM2, anxiety, depression, GERD, osteoarthritis, nephrolithiasis, LLL lung lesion s/p biopsy 01/2012 showing lymphoid neoplasm, non-hodgkins B cell lymphoma s/p radiation '14, hysterectomy. She was hospitalized with a COPD exacerbation from 10/27/13 - 10/28/13 then readmitted with progressive SOB, N/V, chest pain (patient denied ever having chest pain), and CHF (elevated BNP > 3000) on 10/30/13 - 11/02/13. She ruled out for MI by enzymes, and other work-up showed possible stone in the gallbladder neck and possible biliary dyskinesis. LVEF WNL, and volume excess responded to diuresis. She was discharged on home O2 @ 2L/[place].  CHF and acute respiratory failure where felt resolved at her 11/12/13 follow-up appointment with her PCP Dr. Arnette Norris.  Her continuous home O2 was also discontined.  Unfortunately she developed new low grade fever, cough and congestion last week.  She was re-evaluated by Webb Silversmith, NP on 12/02/13 and treated for COPD exacerbation with doxycycline and prednisone taper. Patient did notify Baity, NP of plans for surgery. Oncologist is Dr. Julien Nordmann.  Meds: Xanax, Victoza, albuterol nebulizer and HFA, Fosamax, ASA 81 mg, Celexa, doxycycline, Lasix PRN, Mucinex, Cozaar, Toprol, Percocet, Protonix, prednisone taper X 6 days (started 12/02/13) promethazine, vitamin B-12.    EKG on 10/30/13 showed: SR, borderline short PR interval.   Echo on 10/31/13 showed: - Left ventricle: The cavity size was normal. Wall thickness was increased in a pattern of mild LVH. Systolic function was normal. The estimated ejection fraction was in the range of 60% to 65%. Wall motion was normal; there were no regional wall motion abnormalities. Doppler parameters are consistent with  abnormal left ventricular relaxation (grade 1 diastolic dysfunction). - Aortic valve: Mildly calcified annulus. Trileaflet. There was mild regurgitation. - Mitral valve: Mildly thickened leaflets . There was trivial regurgitation. - Right atrium: Central venous pressure (est): 3 mm Hg. - Tricuspid valve: There was physiologic regurgitation. - Pulmonary arteries: Systolic pressure could not be accurately estimated. - Pericardium, extracardiac: There was no pericardial effusion.  CTA of the chest on 10/30/13 showed: No CT findings for pulmonary embolism. Stable moderate atherosclerotic calcifications involving the aorta branch vessels. Stable basilar scarring changes in atelectasis. Stable emphysematous changes.  12/04/13 CXR: The heart size and mediastinal contours are within normal limits. Both lungs are clear. No pneumothorax or pleural effusion is noted. The visualized skeletal structures are unremarkable. IMPRESSION: No acute cardiopulmonary abnormality seen.  PFTs 01/27/12 showed: FVC 1.00 (40%), FEV1 0.68 (36%), FEF25-75% 0.41 (27%), TLC 3.69 (80%).    Preoperative labs noted.  Cr 0.75.  Non-fasting glucose 206.  H/H 13.9/40.3.  WBC 9.9.  Patient still with some sinus pressure and occasional productive cough, but no more fever.  She feels she is getting better since starting antibiotic therapy.  She is using her Pro-Air HFA TID now.  She has not noticed any increase in SOB.  She denies SOB at rest.  She has chronic exertional dyspnea with activities liking walking up her back deck stairs (> 10 steps), but reports she is able to do shopping, limited walking without SOB.  She is able to lie flat, but has been sleeping on her left side due to RUQ discomfort. She reports stable weight and no new edema since being discharged from the hospital last month.  She denies ever having chest pain, but reported intermittent nausea for  the past 6-7 months and was having back and abdominal pain when she was  readmitted in September 2015 and told that she likely had gallbladder disease.  Her glucose readings have been higher since on prednisone, but reports her fasting glucose runs < 200.  She denied prior stress test or cardiac cath.  She does not see a pulmonologist or cardiologist.   On exam, she is a pleasant Caucasian female in NAD. Heart RRR, no murmur noted.  No LE edema or carotid bruits noted.  Lungs initially course with expiratory wheezing throughout.  She coughed with improvement in lung sounds, but still will intermittent scattered expiratory wheezes, L>R.  Lung sounds were also diminished.    Patient with COPD with at least two exacerbations over the past two months.  She is starting to feel better since her antibiotics and prednisone were started just a few days ago.  She did not have any acute CHF symptoms today.  I told her that her current COPD exacerbation will need to be cleared up prior to any elective surgery.  Since her surgery is still a week out, it's difficult to know if she will be ready or not.  I discussed with anesthesiologist Dr. Marcie Bal who agrees that another follow-up visit at her PCP office prior to surgery would be recommended for medical clearance.  I have notified the patient and triage nurse Tonya at [facility]. If ultimately cleared, I did tell her to go ahead and use her albuterol on the morning of surgery.  George Hugh [facility] Short Stay Center/Anesthesiology Phone [phone] 12/04/2013 3:21 PM  Addendum: Patient was re-evaluated by her PCP Dr. Deborra Medina since her PAT visit.  She was given IM Decadron on 12/05/13. Telephone encounters from today indicate that patient spoke with Dr. Deborra Medina this morning and is feeling better and "strong enough to proceed with surgery."  Dr. Deborra Medina was contacting Dr. Josetta Huddle office.  She is due to complete antibiotics on 11/22/13. Will need further evaluation by her assigned anesthesiologist to determine the definitive anesthesia  plan.  George Hugh [facility] Short Stay Center/Anesthesiology Phone [phone] 12/09/2013 2:40 PM

## 2013-12-05 ENCOUNTER — Encounter: Payer: Self-pay | Admitting: Family Medicine

## 2013-12-05 ENCOUNTER — Ambulatory Visit (INDEPENDENT_AMBULATORY_CARE_PROVIDER_SITE_OTHER): Payer: Commercial Managed Care - HMO | Admitting: Family Medicine

## 2013-12-05 VITALS — BP 124/76 | HR 89 | Temp 97.5°F | Wt 124.8 lb

## 2013-12-05 DIAGNOSIS — R059 Cough, unspecified: Secondary | ICD-10-CM

## 2013-12-05 DIAGNOSIS — R05 Cough: Secondary | ICD-10-CM

## 2013-12-05 DIAGNOSIS — J441 Chronic obstructive pulmonary disease with (acute) exacerbation: Secondary | ICD-10-CM

## 2013-12-05 MED ORDER — DEXAMETHASONE SODIUM PHOSPHATE 10 MG/ML IJ SOLN
10.0000 mg | Freq: Once | INTRAMUSCULAR | Status: AC
  Administered 2013-12-05: 10 mg via INTRAVENOUS

## 2013-12-05 NOTE — Progress Notes (Signed)
Pre visit review using our clinic review tool, if applicable. No additional management support is needed unless otherwise documented below in the visit note. 
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
## 2013-12-05 NOTE — Patient Instructions (Signed)
Good to see you. Hang in there. Please call us on Monday or Tuesday with an update of your symptoms.  Then I can call your surgeon with an update.

## 2013-12-05 NOTE — Assessment & Plan Note (Signed)
With persistent wheezing on exam. Finish course of doxycyline.  Continue nebs at home. Given IM decadron today as a steroid boost- finish oral course of prednisone as well- she is almost finished with taper. She will call me early next week -we may need to postpone her gall bladder surgery. The patient indicates understanding of these issues and agrees with the plan.

## 2013-12-09 ENCOUNTER — Telehealth: Payer: Self-pay

## 2013-12-09 ENCOUNTER — Telehealth: Payer: Self-pay | Admitting: Family Medicine

## 2013-12-09 NOTE — Telephone Encounter (Signed)
Spoke to pt and advised per Dr Deborra Medina. Pt states that she "feel ok" and does feel strong enough for surgery. Pt states that she will contact surgeon to see if she can have EKG done day of surgery or if it needs to be completed prior; will contact office if needing prior

## 2013-12-09 NOTE — Telephone Encounter (Signed)
Pt is feeling a lot better; pt still has head congestion but not so much chest congestion. pts is coughing a lot less. Pt has 2 days more of antibiotic. Pt is having stomach pain and pt not eating because she is afraid she will vomit. Pt wheezes in her throat. No fever or SOB. Pt is scheduled for surgery on 12/11/13.Pt wants to know if Dr Deborra Medina can call surgeon about pt having surgery. Pt said she has not had an EKG prior to surgery.pt also wants to know if needs another CXR. Pt request cb.

## 2013-12-09 NOTE — Telephone Encounter (Signed)
Please her surgeon, Dr. Brantley Stage, to let them know that I spoke with Ms. Harvey and she feels better-she feels she is strong enough to proceed with surgery.

## 2013-12-09 NOTE — Telephone Encounter (Signed)
Does she feel she is strong enough for surgery? No she does not need another chest xray but the surgeon may want her to have an EKG done.

## 2013-12-10 MED ORDER — CHLORHEXIDINE GLUCONATE 4 % EX LIQD
1.0000 "application " | Freq: Once | CUTANEOUS | Status: DC
  Filled 2013-12-10: qty 15

## 2013-12-10 MED ORDER — CEFAZOLIN SODIUM-DEXTROSE 2-3 GM-% IV SOLR
2.0000 g | INTRAVENOUS | Status: AC
  Administered 2013-12-11: 2 g via INTRAVENOUS
  Filled 2013-12-10: qty 50

## 2013-12-10 NOTE — Telephone Encounter (Signed)
Spoke to [place], triage nurse at Dr Cornett's office and advised per Dr Deborra Medina. She states that she will relay message; pts surgery scheduled for 12/11/13

## 2013-12-11 ENCOUNTER — Ambulatory Visit (HOSPITAL_COMMUNITY): Payer: Medicare HMO | Admitting: Certified Registered"

## 2013-12-11 ENCOUNTER — Encounter (HOSPITAL_COMMUNITY): Payer: Self-pay | Admitting: *Deleted

## 2013-12-11 ENCOUNTER — Ambulatory Visit (HOSPITAL_COMMUNITY)
Admission: RE | Admit: 2013-12-11 | Discharge: 2013-12-12 | Disposition: A | Discharge status: Home / Self Care | Payer: Medicare HMO | Source: Ambulatory Visit | Attending: Surgery | Admitting: Surgery

## 2013-12-11 ENCOUNTER — Ambulatory Visit (HOSPITAL_COMMUNITY): Payer: Medicare HMO

## 2013-12-11 ENCOUNTER — Encounter (HOSPITAL_COMMUNITY): Payer: Medicare HMO | Admitting: Vascular Surgery

## 2013-12-11 ENCOUNTER — Encounter (HOSPITAL_COMMUNITY)
Admission: RE | Disposition: A | Discharge status: Home / Self Care | Payer: Self-pay | Source: Ambulatory Visit | Attending: Surgery

## 2013-12-11 DIAGNOSIS — Z23 Encounter for immunization: Secondary | ICD-10-CM | POA: Insufficient documentation

## 2013-12-11 DIAGNOSIS — J449 Chronic obstructive pulmonary disease, unspecified: Secondary | ICD-10-CM | POA: Diagnosis not present

## 2013-12-11 DIAGNOSIS — I509 Heart failure, unspecified: Secondary | ICD-10-CM | POA: Insufficient documentation

## 2013-12-11 DIAGNOSIS — N289 Disorder of kidney and ureter, unspecified: Secondary | ICD-10-CM | POA: Insufficient documentation

## 2013-12-11 DIAGNOSIS — E119 Type 2 diabetes mellitus without complications: Secondary | ICD-10-CM | POA: Insufficient documentation

## 2013-12-11 DIAGNOSIS — F329 Major depressive disorder, single episode, unspecified: Secondary | ICD-10-CM | POA: Insufficient documentation

## 2013-12-11 DIAGNOSIS — K811 Chronic cholecystitis: Secondary | ICD-10-CM | POA: Diagnosis not present

## 2013-12-11 DIAGNOSIS — K219 Gastro-esophageal reflux disease without esophagitis: Secondary | ICD-10-CM | POA: Insufficient documentation

## 2013-12-11 DIAGNOSIS — I739 Peripheral vascular disease, unspecified: Secondary | ICD-10-CM | POA: Insufficient documentation

## 2013-12-11 DIAGNOSIS — Z87891 Personal history of nicotine dependence: Secondary | ICD-10-CM | POA: Insufficient documentation

## 2013-12-11 DIAGNOSIS — Z79899 Other long term (current) drug therapy: Secondary | ICD-10-CM | POA: Diagnosis not present

## 2013-12-11 DIAGNOSIS — K8010 Calculus of gallbladder with chronic cholecystitis without obstruction: Secondary | ICD-10-CM | POA: Diagnosis present

## 2013-12-11 DIAGNOSIS — I10 Essential (primary) hypertension: Secondary | ICD-10-CM | POA: Diagnosis not present

## 2013-12-11 DIAGNOSIS — F419 Anxiety disorder, unspecified: Secondary | ICD-10-CM | POA: Insufficient documentation

## 2013-12-11 DIAGNOSIS — Z791 Long term (current) use of non-steroidal anti-inflammatories (NSAID): Secondary | ICD-10-CM | POA: Diagnosis not present

## 2013-12-11 HISTORY — PX: CHOLECYSTECTOMY: SHX55

## 2013-12-11 LAB — CBC
HEMATOCRIT: 38 % (ref 36.0–46.0)
Hemoglobin: 13 g/dL (ref 12.0–15.0)
MCH: 31.3 pg (ref 26.0–34.0)
MCHC: 34.2 g/dL (ref 30.0–36.0)
MCV: 91.6 fL (ref 78.0–100.0)
Platelets: 305 10*3/uL (ref 150–400)
RBC: 4.15 MIL/uL (ref 3.87–5.11)
RDW: 13.4 % (ref 11.5–15.5)
WBC: 15.9 10*3/uL — ABNORMAL HIGH (ref 4.0–10.5)

## 2013-12-11 LAB — GLUCOSE, CAPILLARY
Glucose-Capillary: 154 mg/dL — ABNORMAL HIGH (ref 70–99)
Glucose-Capillary: 124 mg/dL — ABNORMAL HIGH (ref 70–99)
Glucose-Capillary: 185 mg/dL — ABNORMAL HIGH (ref 70–99)

## 2013-12-11 LAB — CREATININE, SERUM
Creatinine, Ser: 0.72 mg/dL (ref 0.50–1.10)
GFR calc Af Amer: >90 mL/min
GFR calc non Af Amer: 81 mL/min — ABNORMAL LOW

## 2013-12-11 SURGERY — LAPAROSCOPIC CHOLECYSTECTOMY WITH INTRAOPERATIVE CHOLANGIOGRAM
Anesthesia: General | Site: Abdomen

## 2013-12-11 MED ORDER — PANTOPRAZOLE SODIUM 40 MG PO TBEC
40.0000 mg | DELAYED_RELEASE_TABLET | Freq: Two times a day (BID) | ORAL | Status: DC
  Administered 2013-12-11 – 2013-12-12 (×2): 40 mg via ORAL
  Filled 2013-12-11 (×2): qty 1

## 2013-12-11 MED ORDER — ACETAMINOPHEN 650 MG RE SUPP: 650.0000 mg | RECTAL | Status: DC | PRN

## 2013-12-11 MED ORDER — OXYCODONE HCL 5 MG/5ML PO SOLN: 5.0000 mg | Freq: Once | ORAL | Status: DC | PRN

## 2013-12-11 MED ORDER — SODIUM CHLORIDE 0.9 % IJ SOLN
INTRAMUSCULAR | Status: AC
  Filled 2013-12-11: qty 10

## 2013-12-11 MED ORDER — FENTANYL CITRATE 0.05 MG/ML IJ SOLN
INTRAMUSCULAR | Status: AC
  Filled 2013-12-11: qty 5

## 2013-12-11 MED ORDER — OXYCODONE HCL 5 MG PO TABS: 5.0000 mg | ORAL_TABLET | Freq: Once | ORAL | Status: DC | PRN

## 2013-12-11 MED ORDER — SODIUM CHLORIDE 0.9 % IV SOLN
INTRAVENOUS | Status: DC | PRN
  Administered 2013-12-11: 14:00:00

## 2013-12-11 MED ORDER — PROPOFOL 10 MG/ML IV BOLUS
INTRAVENOUS | Status: AC
  Filled 2013-12-11: qty 20

## 2013-12-11 MED ORDER — METOPROLOL SUCCINATE ER 100 MG PO TB24
100.0000 mg | ORAL_TABLET | Freq: Every morning | ORAL | Status: DC
  Administered 2013-12-12: 100 mg via ORAL
  Filled 2013-12-11 (×2): qty 1

## 2013-12-11 MED ORDER — GLYCOPYRROLATE 0.2 MG/ML IJ SOLN
INTRAMUSCULAR | Status: AC
  Filled 2013-12-11: qty 3

## 2013-12-11 MED ORDER — ONDANSETRON HCL 4 MG/2ML IJ SOLN
INTRAMUSCULAR | Status: DC | PRN
  Administered 2013-12-11: 4 mg via INTRAVENOUS

## 2013-12-11 MED ORDER — OXYCODONE HCL 5 MG PO TABS: 5.0000 mg | ORAL_TABLET | ORAL | Status: DC | PRN

## 2013-12-11 MED ORDER — PHENYLEPHRINE HCL 10 MG/ML IJ SOLN
INTRAMUSCULAR | Status: DC | PRN
  Administered 2013-12-11: 80 ug via INTRAVENOUS

## 2013-12-11 MED ORDER — ONDANSETRON HCL 4 MG/2ML IJ SOLN
4.0000 mg | Freq: Four times a day (QID) | INTRAMUSCULAR | Status: DC | PRN
  Administered 2013-12-12: 4 mg via INTRAVENOUS
  Filled 2013-12-11: qty 2

## 2013-12-11 MED ORDER — ENOXAPARIN SODIUM 40 MG/0.4ML ~~LOC~~ SOLN
40.0000 mg | SUBCUTANEOUS | Status: DC
  Administered 2013-12-12: 40 mg via SUBCUTANEOUS
  Filled 2013-12-11 (×2): qty 0.4

## 2013-12-11 MED ORDER — MORPHINE SULFATE 2 MG/ML IJ SOLN: 2.0000 mg | INTRAMUSCULAR | Status: DC | PRN

## 2013-12-11 MED ORDER — 0.9 % SODIUM CHLORIDE (POUR BTL) OPTIME
TOPICAL | Status: DC | PRN
  Administered 2013-12-11 (×2): 1000 mL

## 2013-12-11 MED ORDER — SUCCINYLCHOLINE CHLORIDE 20 MG/ML IJ SOLN
INTRAMUSCULAR | Status: DC | PRN
  Administered 2013-12-11: 40 mg via INTRAVENOUS

## 2013-12-11 MED ORDER — SODIUM CHLORIDE 0.9 % IJ SOLN: 3.0000 mL | INTRAMUSCULAR | Status: DC | PRN

## 2013-12-11 MED ORDER — MORPHINE SULFATE 2 MG/ML IJ SOLN
2.0000 mg | INTRAMUSCULAR | Status: DC | PRN
  Administered 2013-12-11 (×2): 2 mg via INTRAVENOUS
  Filled 2013-12-11 (×2): qty 1

## 2013-12-11 MED ORDER — ALBUTEROL SULFATE HFA 108 (90 BASE) MCG/ACT IN AERS
INHALATION_SPRAY | RESPIRATORY_TRACT | Status: DC | PRN
  Administered 2013-12-11: 2 via RESPIRATORY_TRACT

## 2013-12-11 MED ORDER — ROCURONIUM BROMIDE 50 MG/5ML IV SOLN
INTRAVENOUS | Status: AC
  Filled 2013-12-11: qty 1

## 2013-12-11 MED ORDER — NEOSTIGMINE METHYLSULFATE 10 MG/10ML IV SOLN
INTRAVENOUS | Status: DC | PRN
  Administered 2013-12-11: 3 mg via INTRAVENOUS

## 2013-12-11 MED ORDER — BUPIVACAINE-EPINEPHRINE (PF) 0.25% -1:200000 IJ SOLN
INTRAMUSCULAR | Status: AC
  Filled 2013-12-11: qty 30

## 2013-12-11 MED ORDER — GLYCOPYRROLATE 0.2 MG/ML IJ SOLN
INTRAMUSCULAR | Status: DC | PRN
  Administered 2013-12-11: 0.4 mg via INTRAVENOUS

## 2013-12-11 MED ORDER — LIDOCAINE HCL (CARDIAC) 20 MG/ML IV SOLN
INTRAVENOUS | Status: DC | PRN
  Administered 2013-12-11: 60 mg via INTRAVENOUS

## 2013-12-11 MED ORDER — LIRAGLUTIDE 18 MG/3ML ~~LOC~~ SOPN: 0.6000 mg | PEN_INJECTOR | Freq: Two times a day (BID) | SUBCUTANEOUS | Status: DC

## 2013-12-11 MED ORDER — FENTANYL CITRATE 0.05 MG/ML IJ SOLN
INTRAMUSCULAR | Status: AC
  Filled 2013-12-11: qty 2

## 2013-12-11 MED ORDER — LACTATED RINGERS IV SOLN
INTRAVENOUS | Status: DC
  Administered 2013-12-11: 12:00:00 via INTRAVENOUS

## 2013-12-11 MED ORDER — LOSARTAN POTASSIUM 50 MG PO TABS
50.0000 mg | ORAL_TABLET | Freq: Every morning | ORAL | Status: DC
  Administered 2013-12-12: 50 mg via ORAL
  Filled 2013-12-11 (×2): qty 1

## 2013-12-11 MED ORDER — ALBUTEROL SULFATE (2.5 MG/3ML) 0.083% IN NEBU
3.0000 mL | INHALATION_SOLUTION | Freq: Four times a day (QID) | RESPIRATORY_TRACT | Status: DC | PRN
  Filled 2013-12-11: qty 3

## 2013-12-11 MED ORDER — PROMETHAZINE HCL 25 MG/ML IJ SOLN
INTRAMUSCULAR | Status: AC
  Filled 2013-12-11: qty 1

## 2013-12-11 MED ORDER — FENTANYL CITRATE 0.05 MG/ML IJ SOLN
INTRAMUSCULAR | Status: DC | PRN
Start: 2013-12-11 — End: 2013-12-11
  Administered 2013-12-11 (×3): 50 ug via INTRAVENOUS

## 2013-12-11 MED ORDER — FUROSEMIDE 40 MG PO TABS: 40.0000 mg | ORAL_TABLET | Freq: Every day | ORAL | Status: DC | PRN

## 2013-12-11 MED ORDER — ONDANSETRON HCL 4 MG/2ML IJ SOLN
INTRAMUSCULAR | Status: AC
  Filled 2013-12-11: qty 2

## 2013-12-11 MED ORDER — BUPIVACAINE-EPINEPHRINE 0.25% -1:200000 IJ SOLN
INTRAMUSCULAR | Status: DC | PRN
  Administered 2013-12-11: 30 mL

## 2013-12-11 MED ORDER — ARTIFICIAL TEARS OP OINT
TOPICAL_OINTMENT | OPHTHALMIC | Status: AC
  Filled 2013-12-11: qty 3.5

## 2013-12-11 MED ORDER — OXYCODONE-ACETAMINOPHEN 5-325 MG PO TABS: 1.0000 | ORAL_TABLET | ORAL | Status: DC | PRN

## 2013-12-11 MED ORDER — LIDOCAINE HCL (CARDIAC) 20 MG/ML IV SOLN
INTRAVENOUS | Status: AC
  Filled 2013-12-11: qty 5

## 2013-12-11 MED ORDER — NEOSTIGMINE METHYLSULFATE 10 MG/10ML IV SOLN
INTRAVENOUS | Status: AC
  Filled 2013-12-11: qty 1

## 2013-12-11 MED ORDER — PROMETHAZINE HCL 25 MG/ML IJ SOLN
6.2500 mg | Freq: Once | INTRAMUSCULAR | Status: AC
  Administered 2013-12-11: 6.25 mg via INTRAVENOUS

## 2013-12-11 MED ORDER — EPHEDRINE SULFATE 50 MG/ML IJ SOLN
INTRAMUSCULAR | Status: AC
  Filled 2013-12-11: qty 1

## 2013-12-11 MED ORDER — ALBUTEROL SULFATE HFA 108 (90 BASE) MCG/ACT IN AERS
INHALATION_SPRAY | RESPIRATORY_TRACT | Status: AC
  Filled 2013-12-11: qty 6.7

## 2013-12-11 MED ORDER — PROPOFOL 10 MG/ML IV BOLUS
INTRAVENOUS | Status: DC | PRN
  Administered 2013-12-11: 110 mg via INTRAVENOUS

## 2013-12-11 MED ORDER — SODIUM CHLORIDE 0.9 % IV SOLN: 250.0000 mL | INTRAVENOUS | Status: DC | PRN

## 2013-12-11 MED ORDER — SODIUM CHLORIDE 0.9 % IR SOLN
Status: DC | PRN
  Administered 2013-12-11: 1000 mL

## 2013-12-11 MED ORDER — CITALOPRAM HYDROBROMIDE 40 MG PO TABS
40.0000 mg | ORAL_TABLET | Freq: Every day | ORAL | Status: DC
  Administered 2013-12-11 – 2013-12-12 (×2): 40 mg via ORAL
  Filled 2013-12-11: qty 2
  Filled 2013-12-11 (×2): qty 1

## 2013-12-11 MED ORDER — FENTANYL CITRATE 0.05 MG/ML IJ SOLN
25.0000 ug | INTRAMUSCULAR | Status: DC | PRN
  Administered 2013-12-11 (×4): 25 ug via INTRAVENOUS

## 2013-12-11 MED ORDER — ACETAMINOPHEN 325 MG PO TABS: 650.0000 mg | ORAL_TABLET | ORAL | Status: DC | PRN

## 2013-12-11 MED ORDER — PHENYLEPHRINE 40 MCG/ML (10ML) SYRINGE FOR IV PUSH (FOR BLOOD PRESSURE SUPPORT)
PREFILLED_SYRINGE | INTRAVENOUS | Status: AC
  Filled 2013-12-11: qty 10

## 2013-12-11 MED ORDER — ARTIFICIAL TEARS OP OINT
TOPICAL_OINTMENT | OPHTHALMIC | Status: DC | PRN
  Administered 2013-12-11: 1 via OPHTHALMIC

## 2013-12-11 MED ORDER — ALPRAZOLAM 0.5 MG PO TABS
0.5000 mg | ORAL_TABLET | Freq: Every day | ORAL | Status: DC
  Administered 2013-12-11: 0.5 mg via ORAL
  Filled 2013-12-11: qty 1

## 2013-12-11 MED ORDER — EPHEDRINE SULFATE 50 MG/ML IJ SOLN
INTRAMUSCULAR | Status: DC | PRN
  Administered 2013-12-11: 10 mg via INTRAVENOUS
  Administered 2013-12-11: 5 mg via INTRAVENOUS

## 2013-12-11 MED ORDER — HEMOSTATIC AGENTS (NO CHARGE) OPTIME
TOPICAL | Status: DC | PRN
  Administered 2013-12-11: 1 via TOPICAL

## 2013-12-11 MED ORDER — SODIUM CHLORIDE 0.9 % IJ SOLN: 3.0000 mL | Freq: Two times a day (BID) | INTRAMUSCULAR | Status: DC

## 2013-12-11 MED ORDER — ROCURONIUM BROMIDE 100 MG/10ML IV SOLN
INTRAVENOUS | Status: DC | PRN
  Administered 2013-12-11: 10 mg via INTRAVENOUS
  Administered 2013-12-11: 25 mg via INTRAVENOUS

## 2013-12-11 SURGICAL SUPPLY — 49 items
APPLIER CLIP ROT 10 11.4 M/L (STAPLE) ×3
BLADE SURG ROTATE 9660 (MISCELLANEOUS) IMPLANT
CANISTER SUCTION 2500CC (MISCELLANEOUS) ×3 IMPLANT
CHLORAPREP W/TINT 26ML (MISCELLANEOUS) ×3 IMPLANT
CLIP APPLIE ROT 10 11.4 M/L (STAPLE) ×1 IMPLANT
COVER MAYO STAND STRL (DRAPES) ×3 IMPLANT
COVER SURGICAL LIGHT HANDLE (MISCELLANEOUS) ×3 IMPLANT
DECANTER SPIKE VIAL GLASS SM (MISCELLANEOUS) ×3 IMPLANT
DERMABOND ADVANCED (GAUZE/BANDAGES/DRESSINGS) ×2
DERMABOND ADVANCED .7 DNX12 (GAUZE/BANDAGES/DRESSINGS) ×1 IMPLANT
DRAPE C-ARM 42X72 X-RAY (DRAPES) ×3 IMPLANT
DRAPE LAPAROSCOPIC ABDOMINAL (DRAPES) ×3 IMPLANT
DRAPE UTILITY 15X26 W/TAPE STR (DRAPE) IMPLANT
DRAPE WARM FLUID 44X44 (DRAPE) ×3 IMPLANT
ELECT REM PT RETURN 9FT ADLT (ELECTROSURGICAL) ×3
ELECTRODE REM PT RTRN 9FT ADLT (ELECTROSURGICAL) ×1 IMPLANT
GLOVE BIO SURGEON STRL SZ7 (GLOVE) ×3 IMPLANT
GLOVE BIO SURGEON STRL SZ8 (GLOVE) ×9 IMPLANT
GLOVE BIOGEL PI IND STRL 7.0 (GLOVE) ×1 IMPLANT
GLOVE BIOGEL PI IND STRL 7.5 (GLOVE) ×1 IMPLANT
GLOVE BIOGEL PI IND STRL 8 (GLOVE) ×3 IMPLANT
GLOVE BIOGEL PI INDICATOR 7.0 (GLOVE) ×2
GLOVE BIOGEL PI INDICATOR 7.5 (GLOVE) ×2
GLOVE BIOGEL PI INDICATOR 8 (GLOVE) ×6
GLOVE ECLIPSE 7.5 STRL STRAW (GLOVE) ×3 IMPLANT
GOWN STRL REUS W/ TWL LRG LVL3 (GOWN DISPOSABLE) ×2 IMPLANT
GOWN STRL REUS W/ TWL XL LVL3 (GOWN DISPOSABLE) ×2 IMPLANT
GOWN STRL REUS W/TWL 2XL LVL3 (GOWN DISPOSABLE) ×3 IMPLANT
GOWN STRL REUS W/TWL LRG LVL3 (GOWN DISPOSABLE) ×4
GOWN STRL REUS W/TWL XL LVL3 (GOWN DISPOSABLE) ×4
HEMOSTAT SNOW SURGICEL 2X4 (HEMOSTASIS) ×3 IMPLANT
KIT BASIN OR (CUSTOM PROCEDURE TRAY) ×3 IMPLANT
KIT ROOM TURNOVER OR (KITS) ×3 IMPLANT
NS IRRIG 1000ML POUR BTL (IV SOLUTION) ×6 IMPLANT
PAD ARMBOARD 7.5X6 YLW CONV (MISCELLANEOUS) ×3 IMPLANT
POUCH SPECIMEN RETRIEVAL 10MM (ENDOMECHANICALS) ×3 IMPLANT
SCISSORS LAP 5X35 DISP (ENDOMECHANICALS) ×3 IMPLANT
SET CHOLANGIOGRAPH 5 50 .035 (SET/KITS/TRAYS/PACK) ×3 IMPLANT
SET IRRIG TUBING LAPAROSCOPIC (IRRIGATION / IRRIGATOR) ×3 IMPLANT
SLEEVE ENDOPATH XCEL 5M (ENDOMECHANICALS) ×3 IMPLANT
SPECIMEN JAR SMALL (MISCELLANEOUS) ×3 IMPLANT
SUT MNCRL AB 4-0 PS2 18 (SUTURE) ×3 IMPLANT
TOWEL OR 17X24 6PK STRL BLUE (TOWEL DISPOSABLE) ×3 IMPLANT
TOWEL OR 17X26 10 PK STRL BLUE (TOWEL DISPOSABLE) ×3 IMPLANT
TRAY LAPAROSCOPIC (CUSTOM PROCEDURE TRAY) ×3 IMPLANT
TROCAR XCEL BLUNT TIP 100MML (ENDOMECHANICALS) ×3 IMPLANT
TROCAR XCEL NON-BLD 11X100MML (ENDOMECHANICALS) ×3 IMPLANT
TROCAR XCEL NON-BLD 5MMX100MML (ENDOMECHANICALS) ×3 IMPLANT
TUBING INSUFFLATION (TUBING) ×3 IMPLANT

## 2013-12-11 NOTE — Anesthesia Postprocedure Evaluation (Signed)
  Anesthesia Post-op Note  Patient: Marie Holmes  Procedure(s) Performed: Procedure(s): LAPAROSCOPIC CHOLECYSTECTOMY WITH INTRAOPERATIVE CHOLANGIOGRAM (N/A)  Patient Location: PACU  Anesthesia Type:General  Level of Consciousness: awake  Airway and Oxygen Therapy: Patient Spontanous Breathing  Post-op Pain: mild  Post-op Assessment: Post-op Vital signs reviewed  Post-op Vital Signs: Reviewed  Last Vitals:  Filed Vitals:   12/11/13 1542  BP: 155/53  Pulse: 75  Temp: 36.2 C  Resp: 23    Complications: No apparent anesthesia complications

## 2013-12-11 NOTE — Discharge Instructions (Signed)
[facility] ______[facility] °LAPAROSCOPIC SURGERY: POST OP INSTRUCTIONS °Always review your discharge instruction sheet given to you by the facility where your surgery was performed. °IF YOU HAVE DISABILITY OR FAMILY LEAVE FORMS, YOU MUST BRING THEM TO THE OFFICE FOR PROCESSING.   °DO NOT GIVE THEM TO YOUR DOCTOR. ° °1. A prescription for pain medication may be given to you upon discharge.  Take your pain medication as prescribed, if needed.  If narcotic pain medicine is not needed, then you may take acetaminophen (Tylenol) or ibuprofen (Advil) as needed. °2. Take your usually prescribed medications unless otherwise directed. °3. If you need a refill on your pain medication, please contact your pharmacy.  They will contact our office to request authorization. Prescriptions will not be filled after 5pm or on week-ends. °4. You should follow a light diet the first few days after arrival home, such as soup and crackers, etc.  Be sure to include lots of fluids daily. °5. Most patients will experience some swelling and bruising in the area of the incisions.  Ice packs will help.  Swelling and bruising can take several days to resolve.  °6. It is common to experience some constipation if taking pain medication after surgery.  Increasing fluid intake and taking a stool softener (such as Colace) will usually help or prevent this problem from occurring.  A mild laxative (Milk of Magnesia or Miralax) should be taken according to package instructions if there are no bowel movements after 48 hours. °7. Unless discharge instructions indicate otherwise, you may remove your bandages 24-48 hours after surgery, and you may shower at that time.  You may have steri-strips (small skin tapes) in place directly over the incision.  These strips should be left on the skin for 7-10 days.  If your surgeon used skin glue on the incision, you may shower in 24 hours.  The glue will flake off over the next 2-3 weeks.  Any sutures or  staples will be removed at the office during your follow-up visit. °8. ACTIVITIES:  You may resume regular (light) daily activities beginning the next day--such as daily self-care, walking, climbing stairs--gradually increasing activities as tolerated.  You may have sexual intercourse when it is comfortable.  Refrain from any heavy lifting or straining until approved by your doctor. °a. You may drive when you are no longer taking prescription pain medication, you can comfortably wear a seatbelt, and you can safely maneuver your car and apply brakes. °b. RETURN TO WORK:  __________________________________________________________ °9. You should see your doctor in the office for a follow-up appointment approximately 2-3 weeks after your surgery.  Make sure that you call for this appointment within a day or two after you arrive home to insure a convenient appointment time. °10. OTHER INSTRUCTIONS: __________________________________________________________________________________________________________________________ __________________________________________________________________________________________________________________________ °WHEN TO CALL YOUR DOCTOR: °1. Fever over 101.0 °2. Inability to urinate °3. Continued bleeding from incision. °4. Increased pain, redness, or drainage from the incision. °5. Increasing abdominal pain ° °The clinic staff is available to answer your questions during regular business hours.  Please don’t hesitate to call and ask to speak to one of the nurses for clinical concerns.  If you have a medical emergency, go to the nearest emergency room or call 911.  A surgeon from [facility] is always on call at the hospital. °[address], [address], [place], [place]  [address] ? [address] [address], , [place]   [address] °[phone] ? [phone] ? FAX [phone] °Web site:   [email or web address] °

## 2013-12-11 NOTE — Anesthesia Preprocedure Evaluation (Addendum)
Anesthesia Evaluation  Patient identified by MRN, date of birth, ID band Patient awake    Reviewed: Allergy & Precautions, H&P , NPO status , Patient's Chart, lab work & pertinent test results  Airway Mallampati: I TM Distance: >3 FB Neck ROM: Full    Dental  (+) Edentulous Upper, Edentulous Lower, Dental Advisory Given   Pulmonary shortness of breath and with exertion, COPD COPD inhaler, former smoker,  Recent COPD exacerbation just completing abx and steroids, residual cough, discussed risks of GETA with patient who is unwilling to delay case   + wheezing      Cardiovascular hypertension, Pt. on home beta blockers and Pt. on medications + Peripheral Vascular Disease and +CHF Rhythm:Regular     Neuro/Psych PSYCHIATRIC DISORDERS Anxiety Depression    GI/Hepatic GERD-  ,  Endo/Other  diabetes, Type 2  Renal/GU Renal disease     Musculoskeletal   Abdominal   Peds  Hematology negative hematology ROS (+)   Anesthesia Other Findings   Reproductive/Obstetrics                        Anesthesia Physical Anesthesia Plan  ASA: III  Anesthesia Plan: General   Post-op Pain Management:    Induction: Intravenous  Airway Management Planned: Oral ETT  Additional Equipment: None  Intra-op Plan:   Post-operative Plan: Extubation in OR and Possible Post-op intubation/ventilation  Informed Consent: I have reviewed the patients History and Physical, chart, labs and discussed the procedure including the risks, benefits and alternatives for the proposed anesthesia with the patient or authorized representative who has indicated his/her understanding and acceptance.   Dental advisory given  Plan Discussed with: CRNA, Surgeon and Anesthesiologist  Anesthesia Plan Comments:        Anesthesia Quick Evaluation

## 2013-12-11 NOTE — Interval H&P Note (Signed)
History and Physical Interval Note:  12/11/2013 1:35 PM  Marie Holmes  has presented today for surgery, with the diagnosis of Gallstones  The various methods of treatment have been discussed with the patient and family. After consideration of risks, benefits and other options for treatment, the patient has consented to  Procedure(s): LAPAROSCOPIC CHOLECYSTECTOMY WITH INTRAOPERATIVE CHOLANGIOGRAM (N/A) as a surgical intervention .  The patient's history has been reviewed, patient examined, no change in status, stable for surgery.  I have reviewed the patient's chart and labs.  Questions were answered to the patient's satisfaction.     Jyden Kromer A.

## 2013-12-11 NOTE — Op Note (Signed)
Laparoscopic Cholecystectomy with IOC Procedure Note  Indications: This patient presents with symptomatic gallbladder disease and will undergo laparoscopic cholecystectomy.The procedure has been discussed with the patient. Operative and non operative treatments have been discussed. Risks of surgery include bleeding, infection,  Common bile duct injury,  Injury to the stomach,liver, colon,small intestine, abdominal wall,  Diaphragm,  Major blood vessels,  And the need for an open procedure.  Other risks include worsening of medical problems, death,  DVT and pulmonary embolism, and cardiovascular events.   Medical options have also been discussed. The patient has been informed of long term expectations of surgery and non surgical options,  The patient agrees to proceed.    Pre-operative Diagnosis: Chronic cholecystitis  Post-operative Diagnosis: Same  Surgeon: Gerilynn Mccullars A.   Assistants: OR staff  Anesthesia: General endotracheal anesthesia and Local anesthesia 0.25.% bupivacaine, with epinephrine  ASA Class: 3  Procedure Details  The patient was seen again in the Holding Room. The risks, benefits, complications, treatment options, and expected outcomes were discussed with the patient. The possibilities of reaction to medication, pulmonary aspiration, perforation of viscus, bleeding, recurrent infection, finding a normal gallbladder, the need for additional procedures, failure to diagnose a condition, the possible need to convert to an open procedure, and creating a complication requiring transfusion or operation were discussed with the patient. The patient and/or family concurred with the proposed plan, giving informed consent. The site of surgery properly noted/marked. The patient was taken to Operating Room, identified as Marie Holmes and the procedure verified as Laparoscopic Cholecystectomy with Intraoperative Cholangiograms. A Time Out was held and the above information  confirmed.  Prior to the induction of general anesthesia, antibiotic prophylaxis was administered. General endotracheal anesthesia was then administered and tolerated well. After the induction, the abdomen was prepped in the usual sterile fashion. The patient was positioned in the supine position with the left arm comfortably tucked, along with some reverse Trendelenburg.  Local anesthetic agent was injected into the skin near the right costal margin and an incision made. Optiview 5 mm port placed under direct vision due to previous midline laparotomy. Pneumoperitoneum was then created with CO2 and tolerated well without any adverse changes in the patient's vital signs. Additional trocars were introduced under direct vision with an 11 mm trocar in the epigastrium and two more  5 mm trocars one  in the right upper quadrant  And the second 3 cm to the right of the umbilicus. All skin incisions were infiltrated with a local anesthetic agent before making the incision and placing the trocars.   The gallbladder was identified, the fundus grasped and retracted cephalad. Adhesions were lysed bluntly and with the electrocautery where indicated, taking care not to injure any adjacent organs or viscus. The infundibulum was grasped and retracted laterally, exposing the peritoneum overlying the triangle of Calot. This was then divided and exposed in a blunt fashion. The cystic duct was clearly identified and bluntly dissected circumferentially. The junctions of the gallbladder, cystic duct and common bile duct were clearly identified prior to the division of any linear structure.   An incision was made in the cystic duct and the cholangiogram catheter introduced. The catheter was secured using an endoclip. The study showed no stones and good visualization of the distal and proximal biliary tree. The catheter was then removed.   The cystic duct was then  ligated with surgical clips  on the patient side and  clipped on  the gallbladder side and divided. The cystic artery  was identified, dissected free, ligated with clips and divided as well.  The gallbladder was dissected from the liver bed in retrograde fashion with the electrocautery. . The liver bed was irrigated and inspected. Hemostasis was achieved with the electrocautery and Surgicel snow. Copious irrigation was utilized and was repeatedly aspirated until clear all particulate matter. Hemostasis was achieved with no signs  Of bleeding or bile leakage.  The gallbladder was extracted with endocatch bag via the  epigastric port site  And passed off the field.   Laparoscopy done and there was no evidence of hollow organ or solid organ injury in all 4 quadrants.   Pneumoperitoneum was completely reduced after viewing removal of the trocars under direct vision and  the skin was then closed with 4 O monocryl  and a sterile dressing of dermabond  was applied.  Instrument, sponge, and needle counts were correct at closure and at the conclusion of the case.   Findings: Chronic cholecystitis  Estimated Blood Loss: Minimal         Drains: none         Total IV Fluids: 600 mL         Specimens: Gallbladder           Complications: None; patient tolerated the procedure well.         Disposition: PACU - hemodynamically stable.         Condition: stable

## 2013-12-11 NOTE — H&P (Signed)
Marie Holmes. Whitelock 11/11/2013 8:59 AM Location: [facility] Patient #: [id number] DOB: 1935/08/18 Widowed / Language: Cleophus Molt / Race: White Female  History of Present Illness Marcello Moores A. Barb Shear MD; 11/11/2013 9:30 AM) Patient words: eval gallbaldder      CLINICAL DATA: Nausea, vomiting, epigastric abdominal pain. EXAM: US ABDOMEN LIMITED - RIGHT UPPER QUADRANT COMPARISON: None. FINDINGS: Gallbladder: The gallbladder is underdistended. There is a knee ill-defined echogenic structure within the neck of the gallbladder which measures approximately 0.7 x 0.4 x 0.5 cm (image 82). No definite gallbladder wall thickening given under distention. No pericholecystic fluid. Negative sonographic Murphy's sign. Common bile duct: Diameter: Normal in size measuring 2.5 mm in diameter Liver: There is diffuse increased echogenicity of the hepatic parenchyma suggestive of hepatic steatosis. No discrete hepatic lesions. No intrahepatic biliary duct dilatation. No ascites. IMPRESSION: 1. Suspected approximately 7 mm stone within the neck of an underdistended but otherwise normal-appearing gallbladder. If concern persists for acute cholecystitis, further evaluation could be performed with a HIDA scan as clinically indicated. 2. Suspected hepatic steatosis. Electronically Signed By: Sandi Mariscal M.D. On: 10/30/2013 16:15.  The patient is a 78 year old female who presents for evaluation of gallbladder disease. The onset of the gallbladder disease has been gradual and has been occurring in an increasing pattern for 3 months. The course has been gradually worsening. The gallbladder disease is described as moderate to severe. There has been associated gastrointestinal upset and nausea.   Other Problems Davy Pique Bynum, CMA; 11/11/2013 9:03 AM) Anxiety Disorder Back Pain Chest pain Cholelithiasis Chronic Obstructive Lung Disease Congestive Heart Failure Diabetes  Mellitus Hemorrhoids High blood pressure Kidney Stone Lung Cancer  Past Surgical History Marjean Donna, CMA; 11/11/2013 9:03 AM) Appendectomy Colon Polyp Removal - Open Hysterectomy (not due to cancer) - Partial Resection of Stomach Tonsillectomy  Diagnostic Studies History Marjean Donna, CMA; 11/11/2013 9:03 AM) Colonoscopy 1-5 years ago  Allergies (Hayden, CMA; 11/11/2013 9:02 AM) Chantix *PSYCHOTHERAPEUTIC AND NEUROLOGICAL AGENTS - MISC.*  Medication History Marjean Donna, CMA; 11/11/2013 9:01 AM) ALPRAZolam (0.5MG  Tablet, Oral daily) Active. Alendronate Sodium (70MG  Tablet, Oral daily) Active. Citalopram Hydrobromide (40MG  Tablet, Oral daily) Active. GlipiZIDE (5MG  Tablet, Oral daily) Active. Furosemide (40MG  Tablet, Oral daily) Active. Januvia (50MG  Tablet, Oral daily) Active. Losartan Potassium (50MG  Tablet, Oral daily) Active. Metoprolol Succinate ER (100MG  Tablet ER 24HR, Oral daily) Active. Naproxen (500MG  Tablet, Oral daily) Active. Pantoprazole Sodium (40MG  Tablet DR, Oral daily) Active. Victoza (18MG /3ML Soln Pen-inj, Subcutaneous daily) Active. TRUEtest Test (In Vitro daily) Active.  Social History Marjean Donna, [place]; 11/11/2013 9:03 AM) Alcohol use Recently quit alcohol use. Caffeine use Carbonated beverages, Coffee. Tobacco use Former smoker.  Family History Marjean Donna, CMA; 11/11/2013 9:03 AM) Heart Disease Brother. Respiratory Condition Mother.  Review of Systems Davy Pique Bynum CMA; 11/11/2013 9:03 AM) General Present- Appetite Loss, Chills, Fatigue and Night Sweats. Not Present- Fever, Weight Gain and Weight Loss. Skin Not Present- Change in Wart/Mole, Dryness, Hives, Jaundice, New Lesions, Non-Healing Wounds, Rash and Ulcer. HEENT Not Present- Earache, Hearing Loss, Hoarseness, Nose Bleed, Oral Ulcers, Ringing in the Ears, Seasonal Allergies, Sinus Pain, Sore Throat, Visual Disturbances, Wears glasses/contact lenses and Yellow  Eyes. Cardiovascular Present- Rapid Heart Rate and Shortness of Breath. Not Present- Chest Pain, Difficulty Breathing Lying Down, Leg Cramps, Palpitations and Swelling of Extremities. Gastrointestinal Present- Abdominal Pain, Bloating, Constipation, Excessive gas, Hemorrhoids, Nausea, Rectal Pain and Vomiting. Not Present- Bloody Stool, Change in Bowel Habits, Chronic diarrhea, Difficulty Swallowing, Gets full quickly at meals and  Indigestion. Female Genitourinary Not Present- Frequency, Nocturia, Painful Urination, Pelvic Pain and Urgency. Musculoskeletal Present- Back Pain. Not Present- Joint Pain, Joint Stiffness, Muscle Pain, Muscle Weakness and Swelling of Extremities. Neurological Present- Headaches and Weakness. Not Present- Decreased Memory, Fainting, Numbness, Seizures, Tingling, Tremor and Trouble walking. Psychiatric Present- Anxiety. Not Present- Bipolar, Change in Sleep Pattern, Depression, Fearful and Frequent crying. Endocrine Not Present- Cold Intolerance, Excessive Hunger, Hair Changes, Heat Intolerance, Hot flashes and New Diabetes. Hematology Not Present- Easy Bruising, Excessive bleeding, Gland problems, HIV and Persistent Infections.   Vitals (Sonya Bynum CMA; 11/11/2013 9:04 AM) 11/11/2013 9:03 AM Weight: 126 lb Height: 61in Body Surface Area: 1.57 m Body Mass Index: 23.81 kg/m Pulse: 73 (Regular)  BP: 124/70 (Sitting, Left Arm, Standard)    Physical Exam (Geremy Rister A. Jarvin Ogren MD; 11/11/2013 9:31 AM) General Mental Status-Alert. General Appearance-Consistent with stated age. Hydration-Well hydrated. Voice-Normal.  Eye Eyeball - Bilateral-Extraocular movements intact. Sclera/Conjunctiva - Bilateral-No scleral icterus.  Chest and Lung Exam Chest and lung exam reveals -quiet, even and easy respiratory effort with no use of accessory muscles, normal resonance, no flatness or dullness, non-tender and normal tactile fremitus and on auscultation,  normal breath sounds, no adventitious sounds and normal vocal resonance. Inspection Chest Wall - Normal. Back - normal.  Abdomen Note: midline scar. slight distention. negative Murphy s sign. no peritonitis   Neurologic Neurologic evaluation reveals -alert and oriented x 3 with no impairment of recent or remote memory. Mental Status-Normal.  Musculoskeletal Normal Exam - Left-Upper Extremity Strength Normal and Lower Extremity Strength Normal. Normal Exam - Right-Upper Extremity Strength Normal, Lower Extremity Weakness.  Lymphatic Head & Neck  General Head & Neck Lymphatics: Bilateral - Description - Normal. Axillary  General Axillary Region: Bilateral - Description - Normal. Tenderness - Non Tender. Femoral & Inguinal  Generalized Femoral & Inguinal Lymphatics: Bilateral - Description - Normal. Tenderness - Non Tender.    Assessment & Plan (Latayna Ritchie A. Meilech Virts MD; 11/11/2013 9:34 AM) CALCULUS OF GALLBLADDER WITH CHRONIC CHOLECYSTITIS (574.10  K80.10) Current Plans  I discussed the procedure with the patient. Risks include bleeding, infection, bile duct injuries, need for further surgery. They understand and wish to proceed. Pt Education - Laparoscopic Cholecystectomy: gallbladder   Signed by Turner Daniels, MD (11/11/2013 9:40 AM)

## 2013-12-11 NOTE — Transfer of Care (Signed)
Immediate Anesthesia Transfer of Care Note  Patient: Marie Holmes  Procedure(s) Performed: Procedure(s): LAPAROSCOPIC CHOLECYSTECTOMY WITH INTRAOPERATIVE CHOLANGIOGRAM (N/A)  Patient Location: PACU  Anesthesia Type:General  Level of Consciousness: awake  Airway & Oxygen Therapy: Patient Spontanous Breathing and Patient connected to nasal cannula oxygen  Post-op Assessment: Report given to PACU RN  Post vital signs: Reviewed and stable  Complications: No apparent anesthesia complications

## 2013-12-11 NOTE — H&P (View-Only) (Signed)
Marie Holmes is an 78 y.o. female.   Chief Complaint: ABDOMINAL PAIN FOR MONTHS HPI: 3 MONTH HX RUQ PAIN ABDOMEN CHRONIC SHARP MADE WORSE WITH FATTY FOODS.  COURSE INTERMITTENT.    NAUSEA AND VOMITING AT TIMES. U/S SHOWS GALLSTONE AND ABNORMAL EMPTYING OF GALLBLADDER.   Past Medical History  Diagnosis Date  . Benign tumor of breast 2004    left  . Broken ribs     history of several broken ribs on right and compression  fx  . Fibroma of skin     Multiple fibromas/lipomas on arms and legs B  . Pyelonephritis   . Anxiety   . COPD (chronic obstructive pulmonary disease)   . Hyperlipidemia   . Osteoarthritis   . History of CT scan of abdomen 05/23/2003    Mild fatty liver inf, tiny hypodensity in right lobe of liver  . Kidney stone 02/21/1981    Right/removed  . Pulmonary nodules   . Diabetes mellitus without complication   . Hx of radiation therapy 04/11/12- 05/08/12    left chest wall area, 40 gray 20 fx  . Hypertension   . Diverticulosis   . Adenomatous colon polyp   . Cancer     non-hodgkins b cell lymphoma  . 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 CHF (congestive heart failure)     Past Surgical History  Procedure Laterality Date  . Appendectomy  02/21/1961  . Exploratory laparotomy  02/21/1970    for ? adhesions  . Abdominal hysterectomy  02/21/1970    right ovary remaining  . Bone marrow biopsy  03/09/2012  . Lung core biopsy  02/06/2012    Family History  Problem Relation Age of Onset  . Parkinsonism Brother   . Stroke Father   . Cancer Sister   . Heart disease Brother   . Clotting disorder Father   . Kidney disease Mother    Social History:  reports that she quit smoking about 2 weeks ago. Her smoking use included Cigarettes. She has a 15 pack-year smoking history. She has never used smokeless tobacco. She reports that she does not drink alcohol or use illicit drugs.  Allergies:  Allergies  Allergen  Reactions  . Sulfonamide Derivatives Anaphylaxis  . Chantix [Varenicline] Other (See Comments)    Made her angry, nausea, made blood pressure go up     (Not in a hospital admission)  No results found for this or any previous visit (from the past 48 hour(s)). No results found.  Review of Systems  Constitutional: Negative.   HENT: Negative.   Cardiovascular: Negative.   Gastrointestinal: Positive for nausea and abdominal pain.    There were no vitals taken for this visit. Physical Exam  Constitutional: She is oriented to person, place, and time. She appears well-developed and well-nourished.  GI: She exhibits distension. There is no tenderness.    Musculoskeletal: Normal range of motion.  Neurological: She is alert and oriented to person, place, and time.  Skin: Skin is warm and dry.     Assessment/Plan Chronic cholecystitis/   BD by HIDA The procedure has been discussed with the patient. Operative and non operative treatments have been discussed. Risks of surgery include bleeding, infection,  Common bile duct injury,  Injury to the stomach,liver, colon,small intestine, abdominal wall,  Diaphragm,  Major blood vessels,  And the need for an open procedure.  Other risks include worsening of medical problems, death,  DVT and pulmonary  embolism, and cardiovascular events.   Medical options have also been discussed. The patient has been informed of long term expectations of surgery and non surgical options,  The patient agrees to proceed.    Jahseh Lucchese A. 11/11/2013, 9:36 AM

## 2013-12-11 NOTE — Anesthesia Procedure Notes (Addendum)
Date/Time: 12/11/2013 2:10 PM Performed by: Sampson Si E   Procedure Name: Intubation Date/Time: 12/11/2013 2:04 PM Performed by: Maeola Harman Pre-anesthesia Checklist: Patient identified, Emergency Drugs available, Suction available, Patient being monitored and Timeout performed Patient Re-evaluated:Patient Re-evaluated prior to inductionOxygen Delivery Method: Circle system utilized Preoxygenation: Pre-oxygenation with 100% oxygen Intubation Type: IV induction Ventilation: Mask ventilation without difficulty Laryngoscope Size: Mac and 3 Grade View: Grade I Tube type: Oral Tube size: 7.5 mm Number of attempts: 1 Airway Equipment and Method: Stylet Placement Confirmation: ETT inserted through vocal cords under direct vision,  positive ETCO2 and breath sounds checked- equal and bilateral Secured at: 21 cm Tube secured with: Tape Dental Injury: Teeth and Oropharynx as per pre-operative assessment

## 2013-12-12 DIAGNOSIS — K811 Chronic cholecystitis: Secondary | ICD-10-CM | POA: Diagnosis not present

## 2013-12-12 MED ORDER — OXYCODONE HCL 5 MG PO TABS: 5.0000 mg | ORAL_TABLET | ORAL | Status: DC | PRN

## 2013-12-12 MED ORDER — PNEUMOCOCCAL VAC POLYVALENT 25 MCG/0.5ML IJ INJ
0.5000 mL | INJECTION | Freq: Once | INTRAMUSCULAR | Status: AC
  Administered 2013-12-12: 0.5 mL via INTRAMUSCULAR

## 2013-12-12 MED ORDER — PNEUMOCOCCAL VAC POLYVALENT 25 MCG/0.5ML IJ INJ: 0.5000 mL | INJECTION | INTRAMUSCULAR | Status: DC

## 2013-12-12 NOTE — Progress Notes (Signed)
1 Day Post-Op  Subjective: Pain last night but better today  Objective: Vital signs in last 24 hours: Temp:  [97.2 F (36.2 C)-98.5 F (36.9 C)] 98.5 F (36.9 C) (10/22 0554) Pulse Rate:  [74-95] 95 (10/22 0554) Resp:  [18-26] 19 (10/22 0554) BP: (103-157)/(35-93) 113/55 mmHg (10/22 0554) SpO2:  [91 %-100 %] 93 % (10/22 0554) Weight:  [124 lb 12 oz (56.586 kg)] 124 lb 12 oz (56.586 kg) (10/21 1058) Last BM Date: 12/09/13  Intake/Output from previous day: 10/21 0701 - 10/22 0700 In: 770 [I.V.:770] Out: 650 [Urine:650] Intake/Output this shift:    Incision/Wound:CDI SOFT   Lab Results:   Recent Labs  12/11/13 1954  WBC 15.9*  HGB 13.0  HCT 38.0  PLT 305   BMET  Recent Labs  12/11/13 1954  CREATININE 0.72   PT/INR No results found for this basename: LABPROT, INR,  in the last 72 hours ABG No results found for this basename: PHART, PCO2, PO2, HCO3,  in the last 72 hours  Studies/Results: Dg Cholangiogram Operative  12/11/2013   CLINICAL DATA:  Cholecystectomy for cholelithiasis and biliary dyskinesia.  EXAM: INTRAOPERATIVE CHOLANGIOGRAM  TECHNIQUE: Cholangiographic images from the C-arm fluoroscopic device were submitted for interpretation post-operatively. Please see the procedural report for the amount of contrast and the fluoroscopy time utilized.  COMPARISON:  Ultrasound on 10/30/2013  FINDINGS: Intraoperative imaging with a C-arm demonstrates a normal opacified biliary tree without evidence of filling defect or obstruction. Contrast enters the duodenum normally. There is some reflux into the pancreatic duct. No contrast extravasation.  IMPRESSION: Normal intraoperative cholangiogram.   Electronically Signed   By: Aletta Edouard M.D.   On: 12/11/2013 15:27    Anti-infectives: Anti-infectives   Start     Dose/Rate Route Frequency Ordered Stop   12/11/13 0600  ceFAZolin (ANCEF) IVPB 2 g/50 mL premix     2 g 100 mL/hr over 30 Minutes Intravenous On call to  O.R. 12/10/13 1507 12/11/13 1405      Assessment/Plan: s/p Procedure(s): LAPAROSCOPIC CHOLECYSTECTOMY WITH INTRAOPERATIVE CHOLANGIOGRAM (N/A) Discharge  LOS: 1 day    Teana Lindahl A. 12/12/2013

## 2013-12-12 NOTE — Progress Notes (Signed)
Marie Holmes to be D/C'd Home per MD order.  Discussed with the patient and all questions fully answered.  VSS, Skin clean, dry and intact without evidence of skin break down, no evidence of skin tears noted. Surgical sites clean, dry, intact with no sign of infection. IV catheter discontinued intact. Site without signs and symptoms of complications. Dressing and pressure applied.  An After Visit Summary was printed and given to the patient. Patient received prescription.  D/c education completed with patient/family including follow up instructions, medication list, d/c activities limitations if indicated, with other d/c instructions as indicated by MD - patient able to verbalize understanding, all questions fully answered.   Patient instructed to return to ED, call 911, or call MD for any changes in condition.   Patient escorted via [place], and D/C home via private auto.  Micki Riley 12/12/2013 8:58 AM

## 2013-12-12 NOTE — Discharge Summary (Signed)
Physician Discharge Summary  Patient ID: Marie Holmes MRN: [id number] DOB/AGE: 23-Jan-1936 78 y.o.  Admit date: 12/11/2013 Discharge date: 12/12/2013  Admission [place] CHOLECYSTITIS Patient Active Problem List   Diagnosis Date Noted  . Chronic cholecystitis with calculus 12/11/2013  . Nausea alone 11/08/2013  . Gall bladder pain 11/08/2013  . CHF exacerbation 10/30/2013  . Nausea with vomiting 10/30/2013  . Chest pain 10/30/2013  . COPD with acute exacerbation 10/27/2013  . Acute respiratory failure with hypoxia 10/27/2013  . COPD exacerbation 10/27/2013  . Bowel incontinence 08/12/2013  . Dysuria 08/12/2013  . Lymphadenitis 06/19/2013  . Medicare annual wellness visit, subsequent 05/03/2013  . Encounter for routine gynecological examination 05/03/2013  . Back pain 04/11/2013  . Abnormal urine odor 04/11/2013  . Urinary frequency 02/27/2013  . Non Hodgkin's lymphoma 11/28/2012  . COLONIC POLYPS 09/24/2008  . DIABETES MELLITUS, TYPE II 08/31/2007  . HYPERLIPIDEMIA 05/11/2006  . ANXIETY 05/11/2006  . HYPERTENSION 05/11/2006  . VENOUS INSUFFICIENCY, CHRONIC 05/11/2006  . COPD 05/11/2006  . ANEMIA, IRON DEFICIENCY, UNSPEC. 04/20/2006  . GASTROESOPHAGEAL REFLUX, NO ESOPHAGITIS 04/20/2006  . IRRITABLE BOWEL SYNDROME 04/20/2006  . OSTEOARTHRITIS, MULTI SITES 04/20/2006  . Osteoporosis, unspecified 04/20/2006  . INSOMNIA NOS 04/20/2006  . INCONTINENCE, URGE 04/20/2006    Discharge Diagnoses:  Active Problems:   Chronic cholecystitis with calculus   Discharged Condition: good  Hospital Course: UNREMARKABLE.  Pt with pain issues last night. Much better today.  Good pain control. Ready for discharge. Tolerating diet well.   Consults: None  Significant Diagnostic Studies: labs:  CBC    Component Value Date/Time   WBC 15.9* 12/11/2013 1954   WBC 7.1 10/07/2013 0917   RBC 4.15 12/11/2013 1954   RBC 4.60 10/07/2013 0917   HGB 13.0 12/11/2013 1954   HGB 14.1  10/07/2013 0917   HCT 38.0 12/11/2013 1954   HCT 42.7 10/07/2013 0917   PLT 305 12/11/2013 1954   PLT 270 10/07/2013 0917   MCV 91.6 12/11/2013 1954   MCV 93.0 10/07/2013 0917   MCH 31.3 12/11/2013 1954   MCH 30.7 10/07/2013 0917   MCHC 34.2 12/11/2013 1954   MCHC 33.0 10/07/2013 0917   RDW 13.4 12/11/2013 1954   RDW 13.7 10/07/2013 0917   LYMPHSABS 1.0 12/04/2013 1021   LYMPHSABS 1.5 10/07/2013 0917   MONOABS 0.7 12/04/2013 1021   MONOABS 0.7 10/07/2013 0917   EOSABS 0.0 12/04/2013 1021   EOSABS 0.2 10/07/2013 0917   BASOSABS 0.0 12/04/2013 1021   BASOSABS 0.0 10/07/2013 0917     Treatments: surgery: lap chole IOC  Discharge Exam: Blood pressure 113/55, pulse 95, temperature 98.5 F (36.9 C), temperature source Oral, resp. rate 19, height 5\' 1"  (1.549 m), weight 124 lb 12 oz (56.586 kg), SpO2 93.00%. Resp: clear to auscultation bilaterally Incision/Wound:cdi  Disposition: 06-Home-Health Care Svc  Discharge Instructions   Diet - low sodium heart healthy    Complete by:  As directed      Increase activity slowly    Complete by:  As directed             Medication List         albuterol 108 (90 BASE) MCG/ACT inhaler  Commonly known as:  PROVENTIL HFA;VENTOLIN HFA  Inhale 2 puffs into the lungs every 6 (six) hours as needed for wheezing or shortness of breath.     albuterol (2.5 MG/3ML) 0.083% nebulizer solution  Commonly known as:  PROVENTIL  Take 3 mLs (2.5 mg total) by nebulization  every 4 (four) hours as needed for wheezing or shortness of breath.     alendronate 70 MG tablet  Commonly known as:  FOSAMAX  Take 70 mg by mouth every 7 (seven) days. Take with a full glass of water on an empty stomach. Takes on Tuesday     ALPRAZolam 0.5 MG tablet  Commonly known as:  XANAX  Take 1 tablet (0.5 mg total) by mouth at bedtime.     aspirin EC 81 MG tablet  Take 81 mg by mouth every morning.     citalopram 40 MG tablet  Commonly known as:  CELEXA  TAKE ONE TABLET BY  MOUTH ONCE DAILY IN THE EVENING     doxycycline 100 MG tablet  Commonly known as:  VIBRA-TABS  Take 1 tablet (100 mg total) by mouth 2 (two) times daily.     furosemide 40 MG tablet  Commonly known as:  LASIX  Take 1 tablet (40 mg total) by mouth daily as needed (swelling, shortness of breath).     guaiFENesin 600 MG 12 hr tablet  Commonly known as:  MUCINEX  Take by mouth 2 (two) times daily.     losartan 50 MG tablet  Commonly known as:  COZAAR  Take 1 tablet (50 mg total) by mouth every morning.     metoprolol succinate 100 MG 24 hr tablet  Commonly known as:  TOPROL-XL  Take 1 tablet (100 mg total) by mouth every morning.     oxyCODONE-acetaminophen 5-325 MG per tablet  Commonly known as:  PERCOCET/ROXICET  Take 0.5 tablets by mouth every 6 (six) hours as needed for severe pain.     oxyCODONE-acetaminophen 5-325 MG per tablet  Commonly known as:  ROXICET  Take 1 tablet by mouth every 4 (four) hours as needed.     pantoprazole 40 MG tablet  Commonly known as:  PROTONIX  Take 1 tablet (40 mg total) by mouth 2 (two) times daily.     predniSONE 10 MG tablet  Commonly known as:  DELTASONE  Take 3 tabs on days 1-2, take 2 tabs on days 3-4, take 1 tab on days 5-6     promethazine 12.5 MG tablet  Commonly known as:  PHENERGAN  Take 1 tablet (12.5 mg total) by mouth every 6 (six) hours as needed for nausea or vomiting.     VICTOZA 18 MG/3ML Sopn  Generic drug:  Liraglutide  Inject 0.6 mg into the skin 2 (two) times daily.     vitamin B-12 500 MCG tablet  Commonly known as:  CYANOCOBALAMIN  Take 500 mcg by mouth daily.           Follow-up Information   Follow up with Eulan Heyward A., MD In 3 weeks.   Specialty:  General Surgery   Contact information:   [address] [place] [address] [phone]       Signed: Turner Daniels. 12/12/2013, 7:19 AM

## 2013-12-13 ENCOUNTER — Encounter (HOSPITAL_COMMUNITY): Payer: Self-pay | Admitting: Surgery

## 2013-12-16 ENCOUNTER — Telehealth: Payer: Self-pay

## 2013-12-16 NOTE — Telephone Encounter (Signed)
Attempted to contact [facility]; unable to continue to wait on hold as we are in clinic. Should they call back, pls provide verbal order if possible

## 2013-12-16 NOTE — Telephone Encounter (Signed)
Pt left v/m; when pt was discharged from hospital; pt was sent home with oxygen; pt does not need oxygen any longer. Pt called [facility] and requested them to pick up oxygen equipment; pt was advised PCP will have to call [facility] to give pick up order.Please advise. Pt request cb.

## 2013-12-16 NOTE — Telephone Encounter (Signed)
Ok to give order as requested.

## 2013-12-18 NOTE — Telephone Encounter (Signed)
Pt called back: Please fax order for oxygen to picked up from pt's home to [phone].

## 2013-12-19 NOTE — Telephone Encounter (Signed)
Dr Deborra Medina out of office until Monday, Nov 2. Will have orders written and faxed as requested upon her return.

## 2013-12-23 NOTE — Telephone Encounter (Signed)
Order faxed as requested to [facility] for pickup of O2 tank

## 2013-12-24 ENCOUNTER — Other Ambulatory Visit: Payer: Self-pay | Admitting: *Deleted

## 2013-12-24 MED ORDER — ALPRAZOLAM 0.5 MG PO TABS: 0.5000 mg | ORAL_TABLET | Freq: Every day | ORAL | Status: DC

## 2013-12-24 NOTE — Telephone Encounter (Signed)
Pt requesting medication refill. Last OV 11/2013. Ok to fill per Dr Deborra Medina. Rx to be faxed to requested pharmacy before end of day, today.

## 2013-12-26 ENCOUNTER — Encounter: Payer: Self-pay | Admitting: Family Medicine

## 2013-12-26 ENCOUNTER — Ambulatory Visit (INDEPENDENT_AMBULATORY_CARE_PROVIDER_SITE_OTHER): Payer: Commercial Managed Care - HMO | Admitting: Family Medicine

## 2013-12-26 VITALS — BP 116/68 | HR 89 | Temp 98.0°F | Wt 122.5 lb

## 2013-12-26 DIAGNOSIS — I5189 Other ill-defined heart diseases: Secondary | ICD-10-CM | POA: Insufficient documentation

## 2013-12-26 DIAGNOSIS — I519 Heart disease, unspecified: Secondary | ICD-10-CM

## 2013-12-26 DIAGNOSIS — Z720 Tobacco use: Secondary | ICD-10-CM | POA: Insufficient documentation

## 2013-12-26 DIAGNOSIS — E119 Type 2 diabetes mellitus without complications: Secondary | ICD-10-CM

## 2013-12-26 MED ORDER — METOPROLOL SUCCINATE ER 100 MG PO TB24: 100.0000 mg | ORAL_TABLET | Freq: Every morning | ORAL | Status: DC

## 2013-12-26 MED ORDER — LOSARTAN POTASSIUM 50 MG PO TABS: 50.0000 mg | ORAL_TABLET | Freq: Every morning | ORAL | Status: DC

## 2013-12-26 MED ORDER — GLIPIZIDE ER 5 MG PO TB24: 5.0000 mg | ORAL_TABLET | Freq: Every day | ORAL | Status: DC

## 2013-12-26 MED ORDER — VARENICLINE TARTRATE 0.5 MG X 11 & 1 MG X 42 PO MISC: ORAL | Status: DC

## 2013-12-26 MED ORDER — GLIPIZIDE ER 10 MG PO TB24: 10.0000 mg | ORAL_TABLET | Freq: Every day | ORAL | Status: DC

## 2013-12-26 MED ORDER — PANTOPRAZOLE SODIUM 40 MG PO TBEC: 40.0000 mg | DELAYED_RELEASE_TABLET | Freq: Two times a day (BID) | ORAL | Status: DC

## 2013-12-26 MED ORDER — ALPRAZOLAM 0.5 MG PO TABS: 0.5000 mg | ORAL_TABLET | Freq: Every day | ORAL | Status: DC

## 2013-12-26 NOTE — Assessment & Plan Note (Signed)
Deteriorated. Wants to try chantix again- was on allergy list but says she was not allergic.  She was irritable but she thinks this was due to other stressors at the time. Rx given to pt today.
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
## 2013-12-26 NOTE — Patient Instructions (Addendum)
Great to see you. We are stopping Victoza and restarting glipizide 5 mg daily.  Come back in 3 months but call me soon if blood sugars are very high.  I have sent in the prescription for chantix as well.

## 2013-12-26 NOTE — Progress Notes (Signed)
Pre visit review using our clinic review tool, if applicable. No additional management support is needed unless otherwise documented below in the visit note. 

## 2013-12-26 NOTE — Assessment & Plan Note (Signed)
Asymptomatic and echo reassuring- reviewed this with her today. I do not feel she needs cards referral and she agrees.  She will let me know if she does need to use lasix and or develop other symptoms. The patient indicates understanding of these issues and agrees with the plan.

## 2013-12-26 NOTE — Assessment & Plan Note (Signed)
Advised that glipizide can cause hypoglycemia-start with 5 mg daily. Follow up in 3 months. The patient indicates understanding of these issues and agrees with the plan.

## 2013-12-27 NOTE — Telephone Encounter (Addendum)
Pt left v/m [facility] told pt has not received order to remove O2. pt requesting order be refaxed fax # [phone] to [facility] to have O2 picked up;[facility] order urgent.pt request cb when refaxed.

## 2013-12-30 NOTE — Telephone Encounter (Signed)
Original order re-faxed as requested

## 2014-01-03 ENCOUNTER — Telehealth: Payer: Self-pay | Admitting: Family Medicine

## 2014-01-03 NOTE — Telephone Encounter (Signed)
Noted  

## 2014-01-03 NOTE — Telephone Encounter (Signed)
Pt needs to be seen, likely does need antibiotics but need to make sure no pneumonia. Please work her in/ double book her at around 3 PM. Tell her she will likely have to wait.

## 2014-01-03 NOTE — Telephone Encounter (Signed)
Patient Information:  Caller Name: Hiedi  Phone: [phone]  Patient: , Karalyn  Gender: Female  DOB: Aug 12, 1935  Age: 78 Years  PCP: [facility])  Office Follow Up:  Does the office need to follow up with this patient?: Yes  Instructions For The Office: Appts are full. No appts available at [facility]/or [facility] locations. [facility]to [facility]/or any further. Could MD call in a zpac for her or do u want her to go to UC. Pt uses [facility].    Symptoms  Reason For Call & Symptoms: Pt started with cold /cough - onset yesterday. She is requesting a zpack. Pt is afeb. Her cough is productive of yellow/green sputum. Pt has a hx of lung cancer and feels like she needs to get on top of this with an antibiotic.  Reviewed Health History In EMR: Yes  Reviewed Medications In EMR: Yes  Reviewed Allergies In EMR: Yes  Reviewed Surgeries / Procedures: Yes  Date of Onset of Symptoms: 01/02/2014  Guideline(s) Used:  Cough  Disposition Per Guideline:   See Today in Office  Reason For Disposition Reached:   Severe coughing spells (e.g., whooping sound after coughing, vomiting after coughing)  Advice Given:  N/A  Patient Will Follow Care Advice:  YES

## 2014-01-03 NOTE — Telephone Encounter (Signed)
Marie Holmes notified as instructed.  She states she is in the bed and does not feel like coming in.  She said she would just go to the Urgent Care tomorrow if she is not feeling any better.

## 2014-01-04 ENCOUNTER — Encounter (HOSPITAL_COMMUNITY): Payer: Self-pay | Admitting: Emergency Medicine

## 2014-01-04 ENCOUNTER — Ambulatory Visit (HOSPITAL_COMMUNITY): Payer: Commercial Managed Care - HMO | Attending: Family Medicine

## 2014-01-04 ENCOUNTER — Emergency Department (INDEPENDENT_AMBULATORY_CARE_PROVIDER_SITE_OTHER)
Admission: EM | Admit: 2014-01-04 | Discharge: 2014-01-04 | Disposition: A | Discharge status: Home / Self Care | Payer: Commercial Managed Care - HMO | Source: Home / Self Care | Attending: Family Medicine | Admitting: Family Medicine

## 2014-01-04 DIAGNOSIS — J40 Bronchitis, not specified as acute or chronic: Secondary | ICD-10-CM

## 2014-01-04 DIAGNOSIS — R05 Cough: Secondary | ICD-10-CM | POA: Diagnosis present

## 2014-01-04 DIAGNOSIS — R059 Cough, unspecified: Secondary | ICD-10-CM

## 2014-01-04 MED ORDER — IPRATROPIUM-ALBUTEROL 0.5-2.5 (3) MG/3ML IN SOLN
3.0000 mL | Freq: Once | RESPIRATORY_TRACT | Status: AC
  Administered 2014-01-04: 3 mL via RESPIRATORY_TRACT

## 2014-01-04 MED ORDER — IPRATROPIUM-ALBUTEROL 0.5-2.5 (3) MG/3ML IN SOLN
RESPIRATORY_TRACT | Status: AC
  Filled 2014-01-04: qty 3

## 2014-01-04 MED ORDER — PREDNISONE 10 MG PO TABS: 30.0000 mg | ORAL_TABLET | Freq: Every day | ORAL | Status: DC

## 2014-01-04 MED ORDER — AZITHROMYCIN 250 MG PO TABS: 250.0000 mg | ORAL_TABLET | Freq: Every day | ORAL | Status: DC

## 2014-01-04 NOTE — ED Provider Notes (Signed)
Marie Holmes is a 78 y.o. female who presents to Urgent Care today for Cough congestion headache runny nose productive cough wheezing and shortness of breath. Symptoms present for 2 days. No fevers or chills vomiting or diarrhea or abdominal pain. Patient has tried albuterol which helps a little. She has a history of COPD and CHF. She denies any leg swelling or chest pain.   Past Medical History  Diagnosis Date  . Benign tumor of breast 2004    left  . Broken ribs     history of several broken ribs on right and compression  fx  . Fibroma of skin     Multiple fibromas/lipomas on arms and legs B  . Anxiety   . COPD (chronic obstructive pulmonary disease)   . Hyperlipidemia   . History of CT scan of abdomen 05/23/2003    Mild fatty liver inf, tiny hypodensity in right lobe of liver  . Pulmonary nodules   . Diabetes mellitus without complication   . Hx of radiation therapy 04/11/12- 05/08/12    left chest wall area, 40 gray 20 fx  . Hypertension   . Diverticulosis   . Adenomatous colon polyp   . Cancer     non-hodgkins b cell lymphoma  . 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 Depression   . Pyelonephritis   . Kidney stone 02/21/1981    Right/removed  . GERD (gastroesophageal reflux disease)   . Osteoarthritis     hands, degenerative back , osteoporosis   . Cold virus     seen by Dr. Deborra Medina on 10/12, given Rx for antibiotic & steroid  . CHF (congestive heart failure)     hosp. for CHF- 10/2013  . Shortness of breath     only with movemnent    Past Surgical History  Procedure Laterality Date  . Appendectomy  02/21/1961  . Exploratory laparotomy  02/21/1970    for ? adhesions  . Abdominal hysterectomy  02/21/1970    right ovary remaining  . Bone marrow biopsy  03/09/2012  . Lung core biopsy  02/06/2012  . Tonsillectomy    . Breast surgery  1970's    lump removed- benign   . Tubal ligation    . Eye surgery      bilateral  cataracts removed - /W- IOL  . Cholecystectomy N/A 12/11/2013    Procedure: LAPAROSCOPIC CHOLECYSTECTOMY WITH INTRAOPERATIVE CHOLANGIOGRAM;  Surgeon: Erroll Luna, MD;  Location: [facility];  Service: General;  Laterality: N/A;   History  Substance Use Topics  . Smoking status: Current Every Day Smoker -- 0.50 packs/day for 30 years    Types: Cigarettes  . Smokeless tobacco: Never Used     Comment: 3/4 pack X 50 years  . Alcohol Use: No   ROS as above Medications: No current facility-administered medications for this encounter.   Current Outpatient Prescriptions  Medication Sig Dispense Refill  . albuterol (PROVENTIL HFA;VENTOLIN HFA) 108 (90 BASE) MCG/ACT inhaler Inhale 2 puffs into the lungs every 6 (six) hours as needed for wheezing or shortness of breath.    . ALPRAZolam (XANAX) 0.5 MG tablet Take 1 tablet (0.5 mg total) by mouth at bedtime. 90 tablet 0  . citalopram (CELEXA) 40 MG tablet TAKE ONE TABLET BY MOUTH ONCE DAILY IN THE EVENING 30 tablet 0  . oxycodone (OXY-IR) 5 MG capsule Take 5-10 mg by mouth every 4 (four) hours as needed.    Marland Kitchen alendronate (FOSAMAX)  70 MG tablet Take 70 mg by mouth every 7 (seven) days. Take with a full glass of water on an empty stomach. Takes on Tuesday    . aspirin EC 81 MG tablet Take 81 mg by mouth every morning.    Marland Kitchen azithromycin (ZITHROMAX) 250 MG tablet Take 1 tablet (250 mg total) by mouth daily. Take first 2 tablets together, then 1 every day until finished. 6 tablet 0  . furosemide (LASIX) 40 MG tablet Take 1 tablet (40 mg total) by mouth daily as needed (swelling, shortness of breath). 30 tablet 1  . glipiZIDE (GLIPIZIDE XL) 5 MG 24 hr tablet Take 1 tablet (5 mg total) by mouth daily. 30 tablet 11  . losartan (COZAAR) 50 MG tablet Take 1 tablet (50 mg total) by mouth every morning. 30 tablet 5  . metoprolol succinate (TOPROL-XL) 100 MG 24 hr tablet Take 1 tablet (100 mg total) by mouth every morning. 30 tablet 5  . pantoprazole (PROTONIX) 40  MG tablet Take 1 tablet (40 mg total) by mouth 2 (two) times daily. 60 tablet 1  . predniSONE (DELTASONE) 10 MG tablet Take 3 tablets (30 mg total) by mouth daily. 15 tablet 0  . varenicline (CHANTIX STARTING MONTH PAK) 0.5 MG X 11 & 1 MG X 42 tablet Take one 0.5 mg tablet by mouth once daily for 3 days, then increase to one 0.5 mg tablet twice daily for 4 days, then increase to one 1 mg tablet twice daily. 53 tablet 0  . vitamin B-12 (CYANOCOBALAMIN) 500 MCG tablet Take 500 mcg by mouth daily.     Allergies  Allergen Reactions  . Sulfonamide Derivatives Anaphylaxis     Exam:  BP 185/79 mmHg  Pulse 81  Temp(Src) 97.4 F (36.3 C) (Oral)  Resp 20  SpO2 95% Gen: Well NAD HEENT: EOMI,  MMM Lungs: increased work of breathing with prolonged expiratory phase and wheezing bilaterally Heart: RRR no MRG Abd: NABS, Soft. Nondistended, Nontender Exts: Brisk capillary refill, warm and well perfused.   Patient was given a DuoNeb nebulizer treatment which helped a little.  No results found for this or any previous visit (from the past 24 hour(s)). Dg Chest 2 View  01/04/2014   CLINICAL DATA:  Productive cough for 2 days.  EXAM: CHEST  2 VIEW  COMPARISON:  Chest radiograph 12/04/2013 and chest CT 10/30/2013  FINDINGS: Heart, mediastinal, and hilar contours are normal. The lungs are mildly hyperinflated, similar to prior exam. There is slight interstitial prominence of the peripheral lateral left lung base, stable. No airspace disease or pleural effusion. Negative for pneumothorax. Bones are osteopenic.  IMPRESSION: Stable exam.  No acute cardiopulmonary disease.   Electronically Signed   By: Curlene Dolphin M.D.   On: 01/04/2014 13:03    Assessment and Plan: 78 y.o. female with bronchitis most likely explanation. Very doubtful for pericardial effusion or heart failure. Doubtful for pneumonia. Her breathing difficulties are likely related to her underlying COPD. Plan to continue albuterol and use  prednisone and azithromycin. Follow-up with PCP.  Discussed warning signs or symptoms. Please see discharge instructions. Patient expresses understanding.     Gregor Hams, MD 01/04/14 [phone]

## 2014-01-04 NOTE — Discharge Instructions (Signed)
Thank you for coming in today. Call or go to the emergency room if you get worse, have trouble breathing, have chest pains, or palpitations.   Acute Bronchitis Bronchitis is inflammation of the airways that extend from the windpipe into the lungs (bronchi). The inflammation often causes mucus to develop. This leads to a cough, which is the most common symptom of bronchitis.  In acute bronchitis, the condition usually develops suddenly and goes away over time, usually in a couple weeks. Smoking, allergies, and asthma can make bronchitis worse. Repeated episodes of bronchitis may cause further lung problems.  CAUSES Acute bronchitis is most often caused by the same virus that causes a cold. The virus can spread from person to person (contagious) through coughing, sneezing, and touching contaminated objects. SIGNS AND SYMPTOMS   Cough.   Fever.   Coughing up mucus.   Body aches.   Chest congestion.   Chills.   Shortness of breath.   Sore throat.  DIAGNOSIS  Acute bronchitis is usually diagnosed through a physical exam. Your health care provider will also ask you questions about your medical history. Tests, such as chest X-rays, are sometimes done to rule out other conditions.  TREATMENT  Acute bronchitis usually goes away in a couple weeks. Oftentimes, no medical treatment is necessary. Medicines are sometimes given for relief of fever or cough. Antibiotic medicines are usually not needed but may be prescribed in certain situations. In some cases, an inhaler may be recommended to help reduce shortness of breath and control the cough. A cool mist vaporizer may also be used to help thin bronchial secretions and make it easier to clear the chest.  HOME CARE INSTRUCTIONS  Get plenty of rest.   Drink enough fluids to keep your urine clear or pale yellow (unless you have a medical condition that requires fluid restriction). Increasing fluids may help thin your respiratory secretions  (sputum) and reduce chest congestion, and it will prevent dehydration.   Take medicines only as directed by your health care provider.  If you were prescribed an antibiotic medicine, finish it all even if you start to feel better.  Avoid smoking and secondhand smoke. Exposure to cigarette smoke or irritating chemicals will make bronchitis worse. If you are a smoker, consider using nicotine gum or skin patches to help control withdrawal symptoms. Quitting smoking will help your lungs heal faster.   Reduce the chances of another bout of acute bronchitis by washing your hands frequently, avoiding people with cold symptoms, and trying not to touch your hands to your mouth, nose, or eyes.   Keep all follow-up visits as directed by your health care provider.  SEEK MEDICAL CARE IF: Your symptoms do not improve after 1 week of treatment.  SEEK IMMEDIATE MEDICAL CARE IF:  You develop an increased fever or chills.   You have chest pain.   You have severe shortness of breath.  You have bloody sputum.   You develop dehydration.  You faint or repeatedly feel like you are going to pass out.  You develop repeated vomiting.  You develop a severe headache. MAKE SURE YOU:   Understand these instructions.  Will watch your condition.  Will get help right away if you are not doing well or get worse. Document Released: 03/17/2004 Document Revised: 06/24/2013 Document Reviewed: 07/31/2012 [facility] Patient Information 2015 [place], [place]. This information is not intended to replace advice given to you by your health care provider. Make sure you discuss any questions you have with your  health care provider. ° °

## 2014-01-04 NOTE — ED Notes (Signed)
Pt states that she has had chest cold and sinus pain since Thursday 01/02/2014. Pt states she has had a productive cough also.

## 2014-01-08 ENCOUNTER — Ambulatory Visit (INDEPENDENT_AMBULATORY_CARE_PROVIDER_SITE_OTHER): Payer: Commercial Managed Care - HMO | Admitting: Family Medicine

## 2014-01-08 ENCOUNTER — Encounter: Payer: Self-pay | Admitting: Family Medicine

## 2014-01-08 ENCOUNTER — Other Ambulatory Visit: Payer: Self-pay | Admitting: Family Medicine

## 2014-01-08 VITALS — BP 134/74 | HR 79 | Temp 97.3°F | Wt 122.0 lb

## 2014-01-08 DIAGNOSIS — E119 Type 2 diabetes mellitus without complications: Secondary | ICD-10-CM

## 2014-01-08 DIAGNOSIS — J441 Chronic obstructive pulmonary disease with (acute) exacerbation: Secondary | ICD-10-CM

## 2014-01-08 MED ORDER — MOXIFLOXACIN HCL 400 MG PO TABS: 400.0000 mg | ORAL_TABLET | Freq: Every day | ORAL | Status: DC

## 2014-01-08 MED ORDER — EXENATIDE ER 2 MG ~~LOC~~ SUSR: 2.0000 mg | SUBCUTANEOUS | Status: DC

## 2014-01-08 MED ORDER — DEXAMETHASONE SODIUM PHOSPHATE 10 MG/ML IJ SOLN
10.0000 mg | Freq: Once | INTRAMUSCULAR | Status: AC
  Administered 2014-01-08: 10 mg via INTRAMUSCULAR

## 2014-01-08 NOTE — Progress Notes (Signed)
Pre visit review using our clinic review tool, if applicable. No additional management support is needed unless otherwise documented below in the visit note. 

## 2014-01-08 NOTE — Assessment & Plan Note (Signed)
Symptoms deteriorated and lung exam concerning. She continues to refuse nebs treatment in office and inhaled steroids. Does not want to take oral pred- did agree to IM decadron in office today.  Given duration and progression of symptoms in a smoker, will extend course of abx with 5 day course of Avelox.  Call or return to clinic prn if these symptoms worsen or fail to improve as anticipated.  The patient indicates understanding of these issues and agrees with the plan.

## 2014-01-08 NOTE — Patient Instructions (Signed)
Good to see you. Hang in there. Take antibiotic as directed.  Please call me later with an update.
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
## 2014-01-08 NOTE — Assessment & Plan Note (Signed)
Deteriorated.  She asks to increase dose of glipizide but I explained this was not a safe choice in her age group. Did agree to try Bydureon- eRx sent. Follow up in 1 month.

## 2014-01-14 ENCOUNTER — Encounter: Payer: Self-pay | Admitting: Family Medicine

## 2014-01-14 ENCOUNTER — Ambulatory Visit (INDEPENDENT_AMBULATORY_CARE_PROVIDER_SITE_OTHER): Payer: Commercial Managed Care - HMO | Admitting: Family Medicine

## 2014-01-14 VITALS — BP 118/74 | HR 80 | Temp 97.3°F | Wt 123.5 lb

## 2014-01-14 DIAGNOSIS — E119 Type 2 diabetes mellitus without complications: Secondary | ICD-10-CM

## 2014-01-14 DIAGNOSIS — J441 Chronic obstructive pulmonary disease with (acute) exacerbation: Secondary | ICD-10-CM

## 2014-01-14 MED ORDER — MOXIFLOXACIN HCL 400 MG PO TABS: 400.0000 mg | ORAL_TABLET | Freq: Every day | ORAL | Status: DC

## 2014-01-14 MED ORDER — FLUTICASONE PROPIONATE HFA 110 MCG/ACT IN AERO: 1.0000 | INHALATION_SPRAY | Freq: Two times a day (BID) | RESPIRATORY_TRACT | Status: DC

## 2014-01-14 MED ORDER — FLUTICASONE PROPIONATE HFA 110 MCG/ACT IN AERO: 2.0000 | INHALATION_SPRAY | Freq: Two times a day (BID) | RESPIRATORY_TRACT | Status: DC
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
## 2014-01-14 NOTE — Progress Notes (Signed)
Pre visit review using our clinic review tool, if applicable. No additional management support is needed unless otherwise documented below in the visit note. 

## 2014-01-14 NOTE — Assessment & Plan Note (Signed)
Persistent in a pt with diabetes and severe COPD. >25 minutes spent in face to face time with patient, >50% spent in counselling or coordination of care She agrees to restart an inhaled steroid- had sore throat with advair diskus- eRx sent for floven twice daily.  Will also extend course of avelox. Call or return to clinic prn if these symptoms worsen or fail to improve as anticipated. The patient indicates understanding of these issues and agrees with the plan.

## 2014-01-14 NOTE — Patient Instructions (Addendum)
Good to see you. Happy Thanksgiving!  Start Flovent 1 puffs twice daily.  Take additional antibiotic.

## 2014-02-18 ENCOUNTER — Other Ambulatory Visit: Payer: Self-pay

## 2014-02-18 NOTE — Telephone Encounter (Signed)
Pt is changing ins co 02/21/2014 and her new ins co does not pay for alprazolam. Pt request early refill of alprazolam to [facility]. Pt request cb. Pt last seen 01/14/14 and alprazolam last filled for # 90 on 12/26/13. Advised pt this would be 1 month early but pt request refill to be done so ins will pay on med.Please advise.

## 2014-02-19 ENCOUNTER — Other Ambulatory Visit: Payer: Self-pay | Admitting: Family Medicine

## 2014-02-19 MED ORDER — ALPRAZOLAM 0.5 MG PO TABS: 0.5000 mg | ORAL_TABLET | Freq: Every day | ORAL | Status: DC

## 2014-02-19 NOTE — Telephone Encounter (Signed)
Pt does not have any medication left and is requesting refill.

## 2014-02-19 NOTE — Telephone Encounter (Signed)
She still has enough for 1 month. Dr. Deborra Medina can change medication according to new formulary so will not refill xanax early

## 2014-02-20 NOTE — Telephone Encounter (Signed)
Rx called in to requested pharmacy 

## 2014-02-27 ENCOUNTER — Encounter: Payer: Self-pay | Admitting: Family Medicine

## 2014-02-27 ENCOUNTER — Ambulatory Visit (INDEPENDENT_AMBULATORY_CARE_PROVIDER_SITE_OTHER): Payer: PPO | Admitting: Family Medicine

## 2014-02-27 VITALS — BP 146/84 | HR 79 | Temp 97.5°F | Wt 121.5 lb

## 2014-02-27 DIAGNOSIS — R059 Cough, unspecified: Secondary | ICD-10-CM | POA: Insufficient documentation

## 2014-02-27 DIAGNOSIS — F411 Generalized anxiety disorder: Secondary | ICD-10-CM

## 2014-02-27 DIAGNOSIS — R05 Cough: Secondary | ICD-10-CM

## 2014-02-27 DIAGNOSIS — L989 Disorder of the skin and subcutaneous tissue, unspecified: Secondary | ICD-10-CM

## 2014-02-27 MED ORDER — CITALOPRAM HYDROBROMIDE 40 MG PO TABS: 40.0000 mg | ORAL_TABLET | Freq: Every day | ORAL | Status: DC

## 2014-02-27 MED ORDER — ALPRAZOLAM 0.5 MG PO TABS: 0.5000 mg | ORAL_TABLET | Freq: Three times a day (TID) | ORAL | Status: DC | PRN

## 2014-02-27 NOTE — Assessment & Plan Note (Signed)
New- 2 seem consistent with SKs and the one on her arm is more concerning for possible malignancy.  She has a dermatologist, Dr. Allyson Sabal, will refer her back to derm for evaluation. ?if areas on her abdomen are adipose tissue.  Advised following up with Dr. Allyson Sabal about this as well.

## 2014-02-27 NOTE — Patient Instructions (Signed)
Good to see you. I am so sorry about the stress you and your family are under.  We are increasing your dose of Xanax.

## 2014-02-27 NOTE — Assessment & Plan Note (Signed)
New- lung exam reassuring. Steroids and abx not indicated at this time. Continue supportive care with home inhalers. Call or return to clinic prn if these symptoms worsen or fail to improve as anticipated. The patient indicates understanding of these issues and agrees with the plan.

## 2014-02-27 NOTE — Progress Notes (Signed)
Pre visit review using our clinic review tool, if applicable. No additional management support is needed unless otherwise documented below in the visit note. 

## 2014-02-27 NOTE — Assessment & Plan Note (Signed)
Deteriorated. >25 minutes spent in face to face time with patient, >50% spent in counselling or coordination of care She is understandably under more stress and having increased anxiety.  Will increase dose of xanax for now to 0.5 mg three times daily prn but we discussed sedation and addiction potential again today.  Refilled celexa and advised she start this right away. Call or return to clinic prn if these symptoms worsen or fail to improve as anticipated. The patient indicates understanding of these issues and agrees with the plan.
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
## 2014-04-02 ENCOUNTER — Ambulatory Visit (INDEPENDENT_AMBULATORY_CARE_PROVIDER_SITE_OTHER): Payer: PPO | Admitting: Family Medicine

## 2014-04-02 ENCOUNTER — Encounter: Payer: Self-pay | Admitting: Family Medicine

## 2014-04-02 VITALS — BP 114/62 | HR 93 | Temp 97.6°F | Wt 124.0 lb

## 2014-04-02 DIAGNOSIS — I872 Venous insufficiency (chronic) (peripheral): Secondary | ICD-10-CM

## 2014-04-02 DIAGNOSIS — K219 Gastro-esophageal reflux disease without esophagitis: Secondary | ICD-10-CM

## 2014-04-02 DIAGNOSIS — Z23 Encounter for immunization: Secondary | ICD-10-CM

## 2014-04-02 DIAGNOSIS — E119 Type 2 diabetes mellitus without complications: Secondary | ICD-10-CM

## 2014-04-02 DIAGNOSIS — E785 Hyperlipidemia, unspecified: Secondary | ICD-10-CM

## 2014-04-02 DIAGNOSIS — R1013 Epigastric pain: Secondary | ICD-10-CM

## 2014-04-02 LAB — COMPREHENSIVE METABOLIC PANEL
ALT: 9 U/L (ref 0–35)
AST: 10 U/L (ref 0–37)
Albumin: 4 g/dL (ref 3.5–5.2)
Alkaline Phosphatase: 102 U/L (ref 39–117)
BUN: 14 mg/dL (ref 6–23)
CALCIUM: 9.9 mg/dL (ref 8.4–10.5)
CO2: 30 mEq/L (ref 19–32)
Chloride: 103 mEq/L (ref 96–112)
Creatinine, Ser: 0.82 mg/dL (ref 0.40–1.20)
GFR: 71.86 mL/min (ref >60.00)
Glucose, Bld: 207 mg/dL — ABNORMAL HIGH (ref 70–99)
Potassium: 3.9 mEq/L (ref 3.5–5.1)
Sodium: 137 mEq/L (ref 135–145)
Total Bilirubin: 0.6 mg/dL (ref 0.2–1.2)
Total Protein: 6.5 g/dL (ref 6.0–8.3)

## 2014-04-02 LAB — MICROALBUMIN / CREATININE URINE RATIO
Creatinine,U: 130.3 mg/dL
MICROALB/CREAT RATIO: 0.5 mg/g (ref 0.0–30.0)
Microalb, Ur: 0.7 mg/dL (ref 0.0–1.9)

## 2014-04-02 LAB — HEMOGLOBIN A1C: HEMOGLOBIN A1C: 7.9 % — AB (ref 4.6–6.5)

## 2014-04-02 LAB — LIPID PANEL
CHOLESTEROL: 221 mg/dL — AB (ref 0–200)
HDL: 60 mg/dL (ref >39.00)
LDL Cholesterol: 128 mg/dL — ABNORMAL HIGH (ref 0–99)
NonHDL: 161
TRIGLYCERIDES: 167 mg/dL — AB (ref 0.0–149.0)
Total CHOL/HDL Ratio: 4
VLDL: 33.4 mg/dL (ref 0.0–40.0)

## 2014-04-02 LAB — LIPASE: LIPASE: 19 U/L (ref 11.0–59.0)

## 2014-04-02 MED ORDER — PANTOPRAZOLE SODIUM 40 MG PO TBEC: 40.0000 mg | DELAYED_RELEASE_TABLET | Freq: Two times a day (BID) | ORAL | Status: DC

## 2014-04-02 MED ORDER — GLIPIZIDE ER 5 MG PO TB24: 5.0000 mg | ORAL_TABLET | Freq: Two times a day (BID) | ORAL | Status: DC

## 2014-04-02 MED ORDER — SIMVASTATIN 10 MG PO TABS: 10.0000 mg | ORAL_TABLET | Freq: Every day | ORAL | Status: DC

## 2014-04-02 NOTE — Assessment & Plan Note (Addendum)
Deteriorated.  Discussed tx options with Ms. Marie Holmes.  I suggested Invokana but she prefers to increase dose of glipizide.  Discussed risk of hypoglycemia but she states she has been "doubling up at home" for weeks and has had no episodes of hypoglycemia. Also checks her FSBS three times daily. Increase dose of glipizide to 5 mg XL twice daily. Continue ARB. Check a1c, urine micro today. Add statin. Prevnar 13 given today.

## 2014-04-02 NOTE — Assessment & Plan Note (Signed)
Has not been at goal for diabetic. Add statin- zocor 10 mg daily. Check lipid panel and CMET today, repeat these labs in 8 weeks.

## 2014-04-02 NOTE — Addendum Note (Signed)
Addended by: Lucille Passy on: 04/02/2014 10:50 AM   Modules accepted: Orders

## 2014-04-02 NOTE — Patient Instructions (Signed)
Good to see you. We are increasing your Glipizide to 5 mg twice daily. I have also refilled your Protonix. We are also starting simvastatin 10 mg daily.  Please come back for labs in 8 weeks.  We will call you with your lab results from today.

## 2014-04-02 NOTE — Assessment & Plan Note (Signed)
Deteriorated. Protonix rx refilled. Call or return to clinic prn if these symptoms worsen or fail to improve as anticipated. The patient indicates understanding of these issues and agrees with the plan.
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
## 2014-04-02 NOTE — Progress Notes (Signed)
Pre visit review using our clinic review tool, if applicable. No additional management support is needed unless otherwise documented below in the visit note. 

## 2014-04-07 ENCOUNTER — Encounter: Payer: Self-pay | Admitting: *Deleted

## 2014-04-09 ENCOUNTER — Other Ambulatory Visit: Payer: Self-pay | Admitting: *Deleted

## 2014-04-09 MED ORDER — ALPRAZOLAM 0.5 MG PO TABS: 0.5000 mg | ORAL_TABLET | Freq: Three times a day (TID) | ORAL | Status: DC | PRN

## 2014-04-09 NOTE — Telephone Encounter (Signed)
Last f/u appt 03/2014

## 2014-04-09 NOTE — Telephone Encounter (Signed)
Dr. Deborra Medina last 2 notes reviewed, ok to phone in xanax

## 2014-04-10 NOTE — Telephone Encounter (Signed)
Rx called in to requested pharmacy 

## 2014-04-11 ENCOUNTER — Telehealth: Payer: Self-pay | Admitting: *Deleted

## 2014-04-11 ENCOUNTER — Other Ambulatory Visit: Payer: Self-pay | Admitting: *Deleted

## 2014-04-11 DIAGNOSIS — C8590 Non-Hodgkin lymphoma, unspecified, unspecified site: Secondary | ICD-10-CM

## 2014-04-11 NOTE — Telephone Encounter (Signed)
letter received indicating medication quantity was exceeded. Per RBaity, ok for doing to be reduced to once daily, for coverage. Spoke to pt and advised; verbally expressed understanding. Recent refill granted, pt not needing additional at this time; med list updated.

## 2014-04-14 ENCOUNTER — Encounter (HOSPITAL_COMMUNITY): Payer: Self-pay

## 2014-04-14 ENCOUNTER — Ambulatory Visit (HOSPITAL_COMMUNITY)
Admission: RE | Admit: 2014-04-14 | Discharge: 2014-04-14 | Disposition: A | Discharge status: Home / Self Care | Payer: PPO | Source: Ambulatory Visit | Attending: Internal Medicine | Admitting: Internal Medicine

## 2014-04-14 ENCOUNTER — Other Ambulatory Visit (HOSPITAL_BASED_OUTPATIENT_CLINIC_OR_DEPARTMENT_OTHER): Payer: PPO

## 2014-04-14 DIAGNOSIS — C8590 Non-Hodgkin lymphoma, unspecified, unspecified site: Secondary | ICD-10-CM

## 2014-04-14 DIAGNOSIS — Z08 Encounter for follow-up examination after completed treatment for malignant neoplasm: Secondary | ICD-10-CM | POA: Diagnosis present

## 2014-04-14 DIAGNOSIS — C3490 Malignant neoplasm of unspecified part of unspecified bronchus or lung: Secondary | ICD-10-CM | POA: Insufficient documentation

## 2014-04-14 DIAGNOSIS — D47Z9 Other specified neoplasms of uncertain behavior of lymphoid, hematopoietic and related tissue: Secondary | ICD-10-CM

## 2014-04-14 LAB — CBC WITH DIFFERENTIAL/PLATELET
BASO%: 0.5 % (ref 0.0–2.0)
Basophils Absolute: 0 10*3/uL (ref 0.0–0.1)
EOS%: 2.3 % (ref 0.0–7.0)
Eosinophils Absolute: 0.2 10*3/uL (ref 0.0–0.5)
HCT: 44.6 % (ref 34.8–46.6)
HEMOGLOBIN: 14.9 g/dL (ref 11.6–15.9)
LYMPH#: 2.1 10*3/uL (ref 0.9–3.3)
LYMPH%: 28.5 % (ref 14.0–49.7)
MCH: 30.5 pg (ref 25.1–34.0)
MCHC: 33.4 g/dL (ref 31.5–36.0)
MCV: 91.2 fL (ref 79.5–101.0)
MONO#: 0.7 10*3/uL (ref 0.1–0.9)
MONO%: 10 % (ref 0.0–14.0)
NEUT#: 4.4 10*3/uL (ref 1.5–6.5)
NEUT%: 58.7 % (ref 38.4–76.8)
Platelets: 312 10*3/uL (ref 145–400)
RBC: 4.89 10*6/uL (ref 3.70–5.45)
RDW: 13.4 % (ref 11.2–14.5)
WBC: 7.4 10*3/uL (ref 3.9–10.3)

## 2014-04-14 LAB — COMPREHENSIVE METABOLIC PANEL (CC13)
ALBUMIN: 4 g/dL (ref 3.5–5.0)
ALK PHOS: 91 U/L (ref 40–150)
ALT: 9 U/L (ref 0–55)
ANION GAP: 10 meq/L (ref 3–11)
AST: 14 U/L (ref 5–34)
BILIRUBIN TOTAL: 0.96 mg/dL (ref 0.20–1.20)
BUN: 12.8 mg/dL (ref 7.0–26.0)
CO2: 29 meq/L (ref 22–29)
Calcium: 10.1 mg/dL (ref 8.4–10.4)
Chloride: 103 mEq/L (ref 98–109)
Creatinine: 1 mg/dL (ref 0.6–1.1)
EGFR: 56 mL/min/{1.73_m2} — ABNORMAL LOW (ref >90)
GLUCOSE: 167 mg/dL — AB (ref 70–140)
Potassium: 4.2 mEq/L (ref 3.5–5.1)
Sodium: 142 mEq/L (ref 136–145)
Total Protein: 6.8 g/dL (ref 6.4–8.3)

## 2014-04-14 LAB — LACTATE DEHYDROGENASE (CC13): LDH: 133 U/L (ref 125–245)

## 2014-04-14 MED ORDER — IOHEXOL 300 MG/ML  SOLN
80.0000 mL | Freq: Once | INTRAMUSCULAR | Status: AC | PRN
  Administered 2014-04-14: 80 mL via INTRAVENOUS

## 2014-04-21 ENCOUNTER — Ambulatory Visit (HOSPITAL_BASED_OUTPATIENT_CLINIC_OR_DEPARTMENT_OTHER): Payer: PPO | Admitting: Internal Medicine

## 2014-04-21 ENCOUNTER — Telehealth: Payer: Self-pay | Admitting: Internal Medicine

## 2014-04-21 ENCOUNTER — Encounter: Payer: Self-pay | Admitting: Internal Medicine

## 2014-04-21 VITALS — BP 150/59 | HR 70 | Temp 97.7°F | Resp 17 | Ht 61.0 in | Wt 123.8 lb

## 2014-04-21 DIAGNOSIS — C8590 Non-Hodgkin lymphoma, unspecified, unspecified site: Secondary | ICD-10-CM

## 2014-04-21 DIAGNOSIS — J449 Chronic obstructive pulmonary disease, unspecified: Secondary | ICD-10-CM

## 2014-04-21 DIAGNOSIS — D491 Neoplasm of unspecified behavior of respiratory system: Secondary | ICD-10-CM

## 2014-04-21 DIAGNOSIS — I10 Essential (primary) hypertension: Secondary | ICD-10-CM

## 2014-04-21 NOTE — Patient Instructions (Signed)
Smoking Cessation, Tips for Success  If you are ready to quit smoking, congratulations! You have chosen to help yourself be healthier. Cigarettes bring nicotine, tar, carbon monoxide, and other irritants into your body. Your lungs, heart, and blood vessels will be able to work better without these poisons. There are many different ways to quit smoking. Nicotine gum, nicotine patches, a nicotine inhaler, or nicotine nasal spray can help with physical craving. Hypnosis, support groups, and medicines help break the habit of smoking.  WHAT THINGS CAN I DO TO MAKE QUITTING EASIER?   Here are some tips to help you quit for good:  · Pick a date when you will quit smoking completely. Tell all of your friends and family about your plan to quit on that date.  · Do not try to slowly cut down on the number of cigarettes you are smoking. Pick a quit date and quit smoking completely starting on that day.  · Throw away all cigarettes.    · Clean and remove all ashtrays from your home, work, and car.  · On a card, write down your reasons for quitting. Carry the card with you and read it when you get the urge to smoke.  · Cleanse your body of nicotine. Drink enough water and fluids to keep your urine clear or pale yellow. Do this after quitting to flush the nicotine from your body.  · Learn to predict your moods. Do not let a bad situation be your excuse to have a cigarette. Some situations in your life might tempt you into wanting a cigarette.  · Never have "just one" cigarette. It leads to wanting another and another. Remind yourself of your decision to quit.  · Change habits associated with smoking. If you smoked while driving or when feeling stressed, try other activities to replace smoking. Stand up when drinking your coffee. Brush your teeth after eating. Sit in a different chair when you read the paper. Avoid alcohol while trying to quit, and try to drink fewer caffeinated beverages. Alcohol and caffeine may urge you to  smoke.  · Avoid foods and drinks that can trigger a desire to smoke, such as sugary or spicy foods and alcohol.  · Ask people who smoke not to smoke around you.  · Have something planned to do right after eating or having a cup of coffee. For example, plan to take a walk or exercise.  · Try a relaxation exercise to calm you down and decrease your stress. Remember, you may be tense and nervous for the first 2 weeks after you quit, but this will pass.  · Find new activities to keep your hands busy. Play with a pen, coin, or rubber band. Doodle or draw things on paper.  · Brush your teeth right after eating. This will help cut down on the craving for the taste of tobacco after meals. You can also try mouthwash.    · Use oral substitutes in place of cigarettes. Try using lemon drops, carrots, cinnamon sticks, or chewing gum. Keep them handy so they are available when you have the urge to smoke.  · When you have the urge to smoke, try deep breathing.  · Designate your home as a nonsmoking area.  · If you are a heavy smoker, ask your health care provider about a prescription for nicotine chewing gum. It can ease your withdrawal from nicotine.  · Reward yourself. Set aside the cigarette money you save and buy yourself something nice.  · Look for   support from others. Join a support group or smoking cessation program. Ask someone at home or at work to help you with your plan to quit smoking.  · Always ask yourself, "Do I need this cigarette or is this just a reflex?" Tell yourself, "Today, I choose not to smoke," or "I do not want to smoke." You are reminding yourself of your decision to quit.  · Do not replace cigarette smoking with electronic cigarettes (commonly called e-cigarettes). The safety of e-cigarettes is unknown, and some may contain harmful chemicals.  · If you relapse, do not give up! Plan ahead and think about what you will do the next time you get the urge to smoke.  HOW WILL I FEEL WHEN I QUIT SMOKING?  You  may have symptoms of withdrawal because your body is used to nicotine (the addictive substance in cigarettes). You may crave cigarettes, be irritable, feel very hungry, cough often, get headaches, or have difficulty concentrating. The withdrawal symptoms are only temporary. They are strongest when you first quit but will go away within 10-14 days. When withdrawal symptoms occur, stay in control. Think about your reasons for quitting. Remind yourself that these are signs that your body is healing and getting used to being without cigarettes. Remember that withdrawal symptoms are easier to treat than the major diseases that smoking can cause.   Even after the withdrawal is over, expect periodic urges to smoke. However, these cravings are generally short lived and will go away whether you smoke or not. Do not smoke!  WHAT RESOURCES ARE AVAILABLE TO HELP ME QUIT SMOKING?  Your health care provider can direct you to community resources or hospitals for support, which may include:  · Group support.  · Education.  · Hypnosis.  · Therapy.  Document Released: 11/06/2003 Document Revised: 06/24/2013 Document Reviewed: 07/26/2012  ExitCare® Patient Information ©2015 [employer]. This information is not intended to replace advice given to you by your health care provider. Make sure you discuss any questions you have with your health care provider.

## 2014-04-21 NOTE — Telephone Encounter (Signed)
appts made and avs printed for pt  Marie Holmes °

## 2014-04-21 NOTE — Progress Notes (Signed)
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 Sondra Come completed on 05/08/2012.  CURRENT THERAPY: Observation  INTERVAL HISTORY: Marie Holmes 80 y.o. female returns to the clinic today for followup visit. She underwent laparoscopic cholecystectomy on 12/11/2013. The patient is feeling fine today with no specific complaints except for mild fatigue. She denied having any significant chest pain, cough, shortness of breath or hemoptysis. She has no significant weight loss or night sweats. She has repeat CT scan of the chest for evaluation of her disease and she is here for discussion of her scan results and recommendation regarding her condition.  MEDICAL HISTORY: Past Medical History  Diagnosis Date  . Benign tumor of breast 2004    left  . Broken ribs     history of several broken ribs on right and compression  fx  . Fibroma of skin     Multiple fibromas/lipomas on arms and legs B  . Anxiety   . COPD (chronic obstructive pulmonary disease)   . Hyperlipidemia   . History of CT scan of abdomen 05/23/2003    Mild fatty liver inf, tiny hypodensity in right lobe of liver  . Pulmonary nodules   . Diabetes mellitus without complication   . Hx of radiation therapy 04/11/12- 05/08/12    left chest wall area, 40 gray 20 fx  . Hypertension   . Diverticulosis   . Adenomatous colon polyp   . Cancer     non-hodgkins b cell lymphoma  . 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 Depression   . Pyelonephritis   . Kidney stone 02/21/1981    Right/removed  . GERD (gastroesophageal reflux disease)   . Osteoarthritis     hands, degenerative back , osteoporosis   . Cold  virus     seen by Dr. Deborra Medina on 10/12, given Rx for antibiotic & steroid  . CHF (congestive heart failure)     hosp. for CHF- 10/2013  . Shortness of breath     only with movemnent     ALLERGIES:  is allergic to sulfonamide derivatives.  MEDICATIONS:  Current Outpatient Prescriptions  Medication Sig Dispense Refill  . albuterol (PROVENTIL HFA;VENTOLIN HFA) 108 (90 BASE) MCG/ACT inhaler Inhale 2 puffs into the lungs every 6 (six) hours as needed for wheezing or shortness of breath.    Marland Kitchen alendronate (FOSAMAX) 70 MG tablet Take 70 mg by mouth every 7 (seven) days. Take with a full glass of water on an empty stomach. Takes on Tuesday    . ALPRAZolam (XANAX) 0.5 MG tablet Take 1 tablet (0.5 mg total) by mouth 3 (three) times daily as needed for anxiety. 90 tablet 0  . aspirin EC 81 MG tablet Take 81 mg by mouth every morning.    . citalopram (CELEXA) 40 MG tablet Take 1 tablet (40 mg total) by mouth daily. 90 tablet 3  . furosemide (LASIX) 40 MG tablet Take 1 tablet (40 mg total) by mouth daily as needed (swelling, shortness of breath). 30 tablet 1  . glipiZIDE (GLIPIZIDE XL) 5 MG 24 hr tablet Take 1 tablet (5 mg total) by mouth 2 (two) times daily. 60 tablet 11  . losartan (  COZAAR) 50 MG tablet Take 1 tablet (50 mg total) by mouth every morning. 30 tablet 5  . metoprolol succinate (TOPROL-XL) 100 MG 24 hr tablet Take 1 tablet (100 mg total) by mouth every morning. 30 tablet 5  . pantoprazole (PROTONIX) 40 MG tablet Take 1 tablet (40 mg total) by mouth 2 (two) times daily. (Patient taking differently: Take 40 mg by mouth daily. ) 60 tablet 11  . simvastatin (ZOCOR) 10 MG tablet Take 1 tablet (10 mg total) by mouth at bedtime. 90 tablet 3  . varenicline (CHANTIX STARTING MONTH PAK) 0.5 MG X 11 & 1 MG X 42 tablet Take one 0.5 mg tablet by mouth once daily for 3 days, then increase to one 0.5 mg tablet twice daily for 4 days, then increase to one 1 mg tablet twice daily. 53 tablet 0  . vitamin B-12  (CYANOCOBALAMIN) 500 MCG tablet Take 500 mcg by mouth daily.     No current facility-administered medications for this visit.    SURGICAL HISTORY:  Past Surgical History  Procedure Laterality Date  . Appendectomy  02/21/1961  . Exploratory laparotomy  02/21/1970    for ? adhesions  . Abdominal hysterectomy  02/21/1970    right ovary remaining  . Bone marrow biopsy  03/09/2012  . Lung core biopsy  02/06/2012  . Tonsillectomy    . Breast surgery  1970's    lump removed- benign   . Tubal ligation    . Eye surgery      bilateral cataracts removed - /W- IOL  . Cholecystectomy N/A 12/11/2013    Procedure: LAPAROSCOPIC CHOLECYSTECTOMY WITH INTRAOPERATIVE CHOLANGIOGRAM;  Surgeon: Erroll Luna, MD;  Location: [facility];  Service: General;  Laterality: N/A;    REVIEW OF SYSTEMS:  A comprehensive review of systems was negative except for: Constitutional: positive for fatigue   PHYSICAL EXAMINATION: General appearance: alert, cooperative and no distress Head: Normocephalic, without obvious abnormality, atraumatic Neck: no adenopathy Lymph nodes: Cervical, supraclavicular, and axillary nodes normal. Resp: clear to auscultation bilaterally Cardio: regular rate and rhythm, S1, S2 normal, no murmur, click, rub or gallop GI: soft, non-tender; bowel sounds normal; no masses,  no organomegaly Extremities: extremities normal, atraumatic, no cyanosis or edema Skin exam: Several small mobile subcutaneous nodule under the skin.  ECOG PERFORMANCE STATUS: 0 - Asymptomatic  Blood pressure 150/59, pulse 70, temperature 97.7 F (36.5 C), temperature source Oral, resp. rate 17, height _0  (1.549 m), weight 123 lb 12.8 oz (56.155 kg), SpO2 96 %.  LABORATORY DATA: Lab Results  Component Value Date   WBC 7.4 04/14/2014   HGB 14.9 04/14/2014   HCT 44.6 04/14/2014   MCV 91.2 04/14/2014   PLT 312 04/14/2014      Chemistry      Component Value Date/Time   NA 142 04/14/2014 0947   NA 137 04/02/2014  1054   K 4.2 04/14/2014 0947   K 3.9 04/02/2014 1054   CL 103 04/02/2014 1054   CL 103 06/19/2012 0931   CO2 29 04/14/2014 0947   CO2 30 04/02/2014 1054   BUN 12.8 04/14/2014 0947   BUN 14 04/02/2014 1054   CREATININE 1.0 04/14/2014 0947   CREATININE 0.82 04/02/2014 1054   CREATININE 0.73 01/16/2012 1633      Component Value Date/Time   CALCIUM 10.1 04/14/2014 0947   CALCIUM 9.9 04/02/2014 1054   ALKPHOS 91 04/14/2014 0947   ALKPHOS 102 04/02/2014 1054   AST 14 04/14/2014 0947   AST 10 04/02/2014  1054   ALT 9 04/14/2014 0947   ALT 9 04/02/2014 1054   BILITOT 0.96 04/14/2014 0947   BILITOT 0.6 04/02/2014 1054       RADIOGRAPHIC STUDIES: Ct Chest W Contrast  04/14/2014   CLINICAL DATA:  Subsequent encounter for Lung cancer  EXAM: CT CHEST WITH CONTRAST  TECHNIQUE: Multidetector CT imaging of the chest was performed during intravenous contrast administration.  CONTRAST:  19m OMNIPAQUE IOHEXOL 300 MG/ML  SOLN  COMPARISON:  10/30/2013  FINDINGS: Mediastinum / Lymph Nodes: There is no axillary lymphadenopathy. No mediastinal or hilar lymphadenopathy. Heart size is normal. Coronary artery calcification is noted. No pericardial effusion.  Lungs / Pleura: Centrilobular emphysema is noted bilaterally. 4 mm right upper lobe nodule on image 17 is unchanged. No new pulmonary nodule or mass. No focal airspace consolidation. No evidence for pleural effusion. Peripheral scarring at the left base is stable.  MSK / Soft Tissues: Bone windows reveal no worrisome lytic or sclerotic osseous lesions.  Upper Abdomen:  No adrenal nodule or mass.  IMPRESSION: Stable exam.  No CT findings to suggest recurrent disease.   Electronically Signed   By: EMisty StanleyM.D.   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 KSondra Come The patient has no  evidence for disease progression on his recent scan. I discussed the scan results with the patient. I recommended for her to continue on observation with repeat CT scan of the chest in one year. 2) COPD: Continue current treatment with albuterol inhaler. 3) hypertension: Currently on Cozaar and Toprol-XL. 4) diabetes mellitus: She is currently on treatment with glipizide and Januvia.  She was advised to call immediately if she has any concerning symptoms in the interval.  All questions were answered. The patient knows to call the clinic with any problems, questions or concerns. We can certainly see the patient much sooner if necessary.  Disclaimer: This note was dictated with voice recognition software. Similar sounding words can inadvertently be transcribed and may be missed upon review.

## 2014-04-30 ENCOUNTER — Encounter: Attending: Clinical Cardiac Electrophysiology | Primary: Family Medicine

## 2014-04-30 ENCOUNTER — Telehealth: Payer: Self-pay

## 2014-04-30 DIAGNOSIS — R7309 Other abnormal glucose: Secondary | ICD-10-CM

## 2014-04-30 NOTE — Telephone Encounter (Signed)
Pt left v/m request new one touch diabetic meter, test strips and lancet to [facility] . Pt checks BS tid. Please advise.

## 2014-04-30 NOTE — Telephone Encounter (Signed)
Spoke to pt who confirms that she is not on insulin, and therefor her insurance will only cover once daily. Only override is if pt has fluctuating BS readings, which she states she would. ICD 10 must be included when sending Rx. Please enter correct Dx, if needed, to reflect fluctuating readings, for Rx to be sent to requested pharmacy

## 2014-04-30 NOTE — Telephone Encounter (Signed)
Ok to send rx as pt requests. 

## 2014-05-01 DIAGNOSIS — R7309 Other abnormal glucose: Secondary | ICD-10-CM | POA: Insufficient documentation

## 2014-05-01 MED ORDER — GLUCOSE BLOOD VI STRP: ORAL_STRIP | Status: DC

## 2014-05-01 MED ORDER — ONETOUCH BASIC SYSTEM W/DEVICE KIT: PACK | Status: DC

## 2014-05-01 MED ORDER — ONETOUCH LANCETS MISC: Status: DC

## 2014-05-01 NOTE — Telephone Encounter (Signed)
Rx sent to pharmacy   

## 2014-05-01 NOTE — Telephone Encounter (Signed)
Noted.  Diagnosis added to problem list.

## 2014-05-13 ENCOUNTER — Telehealth: Payer: Self-pay | Admitting: Medical Oncology

## 2014-05-13 ENCOUNTER — Telehealth: Payer: Self-pay | Admitting: *Deleted

## 2014-05-13 DIAGNOSIS — E118 Type 2 diabetes mellitus with unspecified complications: Secondary | ICD-10-CM

## 2014-05-13 NOTE — Telephone Encounter (Signed)
Pt requests I fax last month labs to [facility] MD.

## 2014-05-13 NOTE — Telephone Encounter (Signed)
Patient left a voicemail stating that she has not gotten the results yet from her lab work done 04/02/14. Please call patient with results.

## 2014-05-13 NOTE — Telephone Encounter (Signed)
Spoke to pt and advised per Dr Deborra Medina. Pt verbally expressed understanding and is agreeable to referral; advised to await a call with appt details.

## 2014-05-13 NOTE — Telephone Encounter (Signed)
At this point, I do suggest we get an endocrinologist involved.  If she is ok with that, we can place referral.

## 2014-05-13 NOTE — Telephone Encounter (Signed)
Spoke to pt and informed her of results. Advised her that a letter was mailed indicating results. Pt states that she has been checking glucose levels, and they are never less that 160. She believes that the glipizide is not effective, but is not wanting to start insulin as it will put her in the "donut hole" with her insurance. She states that no matter what she eats, her "BS will not go down like it should."

## 2014-05-13 NOTE — Telephone Encounter (Signed)
Referral placed.

## 2014-05-15 NOTE — Telephone Encounter (Signed)
She has about 1 week of victoza left.  I would continue that and the glipizide for now.  If low sugars, then stop glipizide.  Will have patient update PCP on Monday.  She agrees with plan.

## 2014-05-15 NOTE — Telephone Encounter (Signed)
At 4:30 pm today BS was 181 and pt took victoza 0.6 mg. Pt wants to know if should continue this or not until has endocrinologist referral. Pt also takes glipizide twice a day.

## 2014-05-27 ENCOUNTER — Other Ambulatory Visit: Payer: Self-pay | Admitting: *Deleted

## 2014-05-27 MED ORDER — ALPRAZOLAM 0.5 MG PO TABS: 0.5000 mg | ORAL_TABLET | Freq: Three times a day (TID) | ORAL | Status: DC | PRN

## 2014-05-27 NOTE — Telephone Encounter (Addendum)
Called to [facility].   Ms. Andringa notified her prescription has been called to her pharmacy.

## 2014-05-27 NOTE — Telephone Encounter (Signed)
Patient left a voicemail that [facility] has sent a request over for a refill on her Alprazolam and they have not heard anything back from the office. Patient would like this called to [facility]. Last refill 04/09/14 #90. Patient request a call back when this has been called in.

## 2014-05-28 ENCOUNTER — Other Ambulatory Visit: Payer: PPO | Admitting: Family Medicine

## 2014-06-10 ENCOUNTER — Ambulatory Visit (INDEPENDENT_AMBULATORY_CARE_PROVIDER_SITE_OTHER): Payer: PPO | Admitting: Internal Medicine

## 2014-06-10 ENCOUNTER — Encounter: Payer: Self-pay | Admitting: Internal Medicine

## 2014-06-10 VITALS — BP 122/60 | HR 70 | Temp 97.9°F | Wt 120.0 lb

## 2014-06-10 DIAGNOSIS — E1165 Type 2 diabetes mellitus with hyperglycemia: Secondary | ICD-10-CM | POA: Diagnosis not present

## 2014-06-10 DIAGNOSIS — IMO0002 Reserved for concepts with insufficient information to code with codable children: Secondary | ICD-10-CM

## 2014-06-10 LAB — GLUCOSE, RANDOM: GLUCOSE: 136 mg/dL — AB (ref 70–99)

## 2014-06-10 MED ORDER — METFORMIN HCL ER 500 MG PO TB24: 500.0000 mg | ORAL_TABLET | Freq: Two times a day (BID) | ORAL | Status: DC

## 2014-06-10 MED ORDER — GLIPIZIDE ER 5 MG PO TB24: 5.0000 mg | ORAL_TABLET | Freq: Every day | ORAL | Status: DC

## 2014-06-10 NOTE — Progress Notes (Addendum)
Patient ID: Marie Holmes, female   DOB: [birth year], 79 y.o.   MRN: [id number]  HPI: Marie Holmes is a 79 y.o.-year-old female, referred by her PCP, Dr. Deborra Medina, for management of DM2, dx in ~2010, non-insulin-dependent, uncontrolled, without complications. She is here with her daughter. Insurance: [employer].  Last hemoglobin A1c was: Lab Results  Component Value Date   HGBA1C 7.9* 04/02/2014   HGBA1C 7.9* 08/12/2013   HGBA1C 6.9* 04/11/2013   Pt is on a regimen of: - Glipizide XL 5 mg bid! - increased 03/2014 She tried: - Metformin >> diarrhea, incontinence - Bydureon >> cost prohibitive - Victoza 0.6 mg 2x day - this worked in the past, but pushes her earlier in the donut hole (she liked this, though)  Pt checks her sugars 3x a day and they are (per meter download): - am (~ 5 am): 141-180 (8 am) 135, 163 - 233 - 2h after b'fast: n/c - before lunch: 99, 116-223 - 2h after lunch: 170s - before dinner: 119-203 - 2h after dinner: 117 - bedtime: n/c - nighttime: n/c No lows. Lowest sugar was 80s; she has hypoglycemia awareness at 80  Highest sugar was 400.  Glucometer: One Touch Ultra Mini  Pt's meals are: - Breakfast: 3 PB crackers (nabs) - Lunch: meat + veggies + creamed potatoes, strawberry pie - Dinner: PB banana sandwich - Snacks: snack  - + mild CKD, last BUN/creatinine:  Lab Results  Component Value Date   BUN 12.8 04/14/2014   CREATININE 1.0 04/14/2014  On Losartan. - last set of lipids: Lab Results  Component Value Date   CHOL 221* 04/02/2014   HDL 60.00 04/02/2014   LDLCALC 128* 04/02/2014   LDLDIRECT 150.9 04/11/2013   TRIG 167.0* 04/02/2014   CHOLHDL 4 04/02/2014  She was on Zocor >> stopped long time ago. - last eye exam was in 2013. No DR.  - no numbness and tingling in her feet.  Pt has no FH of DM.  She has a h/o Non Hodgkin Lymphoma, COPD, HL, Osteoporosis (she stopped Alendronate b/c somnolence)  ROS: Constitutional: no weight  gain/loss, + fatigue, + hot flushes, + increased appetite Eyes: no blurry vision, no xerophthalmia ENT: no sore throat, no nodules palpated in throat, no dysphagia/odynophagia, no hoarseness Cardiovascular: no CP/SOB/palpitations/leg swelling Respiratory: no cough/SOB/+ wheezing Gastrointestinal: + N/no V/+ D/+ C/+ heartburn Musculoskeletal: no muscle/+ joint aches Skin: no rashes, + dry skin, + easy bruising Neurological: + occasional tremors/no numbness/tingling/dizziness, + HA Psychiatric: + both: depression/anxiety  Past Medical History  Diagnosis Date  . Benign tumor of breast 2004    left  . Broken ribs     history of several broken ribs on right and compression  fx  . Fibroma of skin     Multiple fibromas/lipomas on arms and legs B  . Anxiety   . COPD (chronic obstructive pulmonary disease)   . Hyperlipidemia   . History of CT scan of abdomen 05/23/2003    Mild fatty liver inf, tiny hypodensity in right lobe of liver  . Pulmonary nodules   . Diabetes mellitus without complication   . Hx of radiation therapy 04/11/12- 05/08/12    left chest wall area, 40 gray 20 fx  . Hypertension   . Diverticulosis   . Adenomatous colon polyp   . Cancer     non-hodgkins b cell lymphoma  . 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 Depression   .  Pyelonephritis   . Kidney stone 02/21/1981    Right/removed  . GERD (gastroesophageal reflux disease)   . Osteoarthritis     hands, degenerative back , osteoporosis   . Cold virus     seen by Dr. Deborra Medina on 10/12, given Rx for antibiotic & steroid  . CHF (congestive heart failure)     hosp. for CHF- 10/2013  . Shortness of breath     only with movemnent    Past Surgical History  Procedure Laterality Date  . Appendectomy  02/21/1961  . Exploratory laparotomy  02/21/1970    for ? adhesions  . Abdominal hysterectomy  02/21/1970    right ovary remaining  . Bone marrow biopsy  03/09/2012  . Lung  core biopsy  02/06/2012  . Tonsillectomy    . Breast surgery  1970's    lump removed- benign   . Tubal ligation    . Eye surgery      bilateral cataracts removed - /W- IOL  . Cholecystectomy N/A 12/11/2013    Procedure: LAPAROSCOPIC CHOLECYSTECTOMY WITH INTRAOPERATIVE CHOLANGIOGRAM;  Surgeon: Erroll Luna, MD;  Location: [place];  Service: General;  Laterality: N/A;   History   Social History  . Marital Status: Widowed    Spouse Name: N/A  . Number of Children: 4   Occupational History  . Retired     Investment banker, operational   Social History Main Topics  . Smoking status: Current Every Day Smoker -- 0.50 packs/day for 30 years    Types: Cigarettes  . Smokeless tobacco: Never Used     Comment: 3/4 pack X 50 years  . Alcohol Use: No  . Drug Use: No  . Sexual Activity: Not on file     Comment: pt. stated, " I started smoking again."   Current Outpatient Prescriptions on File Prior to Visit  Medication Sig Dispense Refill  . albuterol (PROVENTIL HFA;VENTOLIN HFA) 108 (90 BASE) MCG/ACT inhaler Inhale 2 puffs into the lungs every 6 (six) hours as needed for wheezing or shortness of breath.    . ALPRAZolam (XANAX) 0.5 MG tablet Take 1 tablet (0.5 mg total) by mouth 3 (three) times daily as needed for anxiety. 90 tablet 0  . aspirin EC 81 MG tablet Take 81 mg by mouth every morning.    . Blood Glucose Monitoring Suppl (Tyronza) W/DEVICE KIT Use as directed to test blood sugar three times daily Dx:R73.09 1 each 0  . citalopram (CELEXA) 40 MG tablet Take 1 tablet (40 mg total) by mouth daily. 90 tablet 3  . glipiZIDE (GLIPIZIDE XL) 5 MG 24 hr tablet Take 1 tablet (5 mg total) by mouth 2 (two) times daily. 60 tablet 11  . glucose blood test strip Use as instructed 100 each 6  . losartan (COZAAR) 50 MG tablet Take 1 tablet (50 mg total) by mouth every morning. 30 tablet 5  . metoprolol succinate (TOPROL-XL) 100 MG 24 hr tablet Take 1 tablet (100 mg total) by mouth  every morning. 30 tablet 5  . ONE TOUCH LANCETS MISC Use as directed to test blood sugar three times daily Dx:R73.09 300 each 2  . simvastatin (ZOCOR) 10 MG tablet Take 1 tablet (10 mg total) by mouth at bedtime. 90 tablet 3  . vitamin B-12 (CYANOCOBALAMIN) 500 MCG tablet Take 500 mcg by mouth daily.    Marland Kitchen alendronate (FOSAMAX) 70 MG tablet Take 70 mg by mouth every 7 (seven) days. Take with a full glass  of water on an empty stomach. Takes on Tuesday    . furosemide (LASIX) 40 MG tablet Take 1 tablet (40 mg total) by mouth daily as needed (swelling, shortness of breath). (Patient not taking: Reported on 06/10/2014) 30 tablet 1  . pantoprazole (PROTONIX) 40 MG tablet Take 1 tablet (40 mg total) by mouth 2 (two) times daily. (Patient not taking: Reported on 06/10/2014) 60 tablet 11  . varenicline (CHANTIX STARTING MONTH PAK) 0.5 MG X 11 & 1 MG X 42 tablet Take one 0.5 mg tablet by mouth once daily for 3 days, then increase to one 0.5 mg tablet twice daily for 4 days, then increase to one 1 mg tablet twice daily. (Patient not taking: Reported on 06/10/2014) 53 tablet 0   No current facility-administered medications on file prior to visit.   Allergies  Allergen Reactions  . Sulfonamide Derivatives Anaphylaxis   Family History  Problem Relation Age of Onset  . Parkinsonism Brother   . Stroke Father   . Cancer Sister   . Heart disease Brother   . Clotting disorder Father   . Kidney disease Mother    PE: BP 122/60 mmHg  Pulse 70  Temp(Src) 97.9 F (36.6 C) (Oral)  Wt 120 lb (54.432 kg)  SpO2 93% Body mass index is 22.69 kg/(m^2). Wt Readings from Last 3 Encounters:  06/10/14 120 lb (54.432 kg)  04/21/14 123 lb 12.8 oz (56.155 kg)  04/02/14 124 lb (56.246 kg)   Constitutional: normal weight, in NAD Eyes: PERRLA, EOMI, no exophthalmos ENT: moist mucous membranes, no thyromegaly, no cervical lymphadenopathy Cardiovascular: RRR, No MRG Respiratory: CTA B Gastrointestinal: abdomen soft, NT,  ND, BS+ Musculoskeletal: no deformities, strength intact in all 4 Skin: moist, warm, no rashes Neurological: no tremor with outstretched hands, DTR normal in all 4  ASSESSMENT: 1. DM2, non-insulin-dependent, uncontrolled, without complications  PLAN:  1. Patient with long-standing, reasonably controlled diabetes, on oral antidiabetic regimen, which became insufficient. She is on Glipizide XL 2x a day without lows, but with suboptimal control. There is no CBG pattern. We discussed about the need to add another med >> she would like to re-add Victoza, but expensive. We discussed to try Metformin XR frst and go to Victoza if she cannot tolerate it. I advised her to start at 500 mg a day and advance to 500 mg 2x a day target dose. She agrees to try this, but will let me know how she tolerates it and how her sugars are doing on it. - I suggested to:  Patient Instructions  Please decrease the Glipizide XL to 5 mg daily in am, before breakfast.  Please start Metformin XR 500 mg with dinner x 4 days. If you tolerate this well, add another Metformin tablet (500 mg) with breakfast x 4 days. Continue with 500 mg of metformin XR  2x a day with breakfast and dinner.  Please return in 1 month with your sugar log.   Please stop at the lab.  - continue checking sugars at different times of the day - check 2-3 times a day, rotating checks - given sugar log and advised how to fill it and to bring it at next appt  - given foot care handout and explained the principles  - given instructions for hypoglycemia management "15-15 rule"  - advised for yearly eye exams >> needs one - per her request, will check her for adequate insulin secretion:  Orders Placed This Encounter  Procedures  . Glucose  . C-peptide  .  Glutamic acid decarboxylase auto abs  . Anti-islet cell antibody  - I advised her that the above tests will take a little longer to return. If low C peptide >> needs insulin. - Return to clinic in 1  mo with sugar log   Office Visit on 06/10/2014  Component Date Value Ref Range Status  . Glucose, Bld 06/10/2014 136* 70 - 99 mg/dL Final  . C-Peptide 06/10/2014 3.17  0.80 - 3.90 ng/mL Final  . Glutamic Acid Decarb Ab 06/10/2014 <5  <5 IU/mL Final   Comment: This test was performed using the GAD65 ELISA method. New method, ELISA, is standardized against the International reference preparation 97/550, is reported in International Units/mL (IU/mL) and a new reference range was implemented.   . Pancreatic Islet Cell Antibody 06/10/2014 <5  < 5 JDF Units Final   Comment:    Laboratory Developed Test performed using a reagent labeled by  the manufacturer as ASR Class I or ASR Class II (non-blood bank).    This test(s) was developed and its performance characteristics  have been determined by [facility],  [place], [place]. It has not been cleared or approved by the U.S.  Food and Drug Administration. The FDA has determined that such  clearance or approval is not necessary. Performance  characteristics refer to the analytical performance of the test.    No signs of insulin deficiency, she does not have type 1 diabetes!

## 2014-06-10 NOTE — Patient Instructions (Signed)
Please decrease the Glipizide XL to 5 mg daily in am, before breakfast.  Please start Metformin XR 500 mg with dinner x 4 days. If you tolerate this well, add another Metformin tablet (500 mg) with breakfast x 4 days. Continue with 500 mg of metformin XR  2x a day with breakfast and dinner.  Please return in 1 month with your sugar log.   Please stop at the lab.  PATIENT INSTRUCTIONS FOR TYPE 2 DIABETES:  **Please join MyChart!** - see attached instructions about how to join if you have not done so already.  DIET AND EXERCISE Diet and exercise is an important part of diabetic treatment.  We recommended aerobic exercise in the form of brisk walking (working between 40-60% of maximal aerobic capacity, similar to brisk walking) for 150 minutes per week (such as 30 minutes five days per week) along with 3 times per week performing 'resistance' training (using various gauge rubber tubes with handles) 5-10 exercises involving the major muscle groups (upper body, lower body and core) performing 10-15 repetitions (or near fatigue) each exercise. Start at half the above goal but build slowly to reach the above goals. If limited by weight, joint pain, or disability, we recommend daily walking in a swimming pool with water up to waist to reduce pressure from joints while allow for adequate exercise.    BLOOD GLUCOSES Monitoring your blood glucoses is important for continued management of your diabetes. Please check your blood glucoses 2-4 times a day: fasting, before meals and at bedtime (you can rotate these measurements - e.g. one day check before the 3 meals, the next day check before 2 of the meals and before bedtime, etc.).   HYPOGLYCEMIA (low blood sugar) Hypoglycemia is usually a reaction to not eating, exercising, or taking too much insulin/ other diabetes drugs.  Symptoms include tremors, sweating, hunger, confusion, headache, etc. Treat IMMEDIATELY with 15 grams of Carbs: . 4 glucose  tablets .  cup regular juice/soda . 2 tablespoons raisins . 4 teaspoons sugar . 1 tablespoon honey Recheck blood glucose in 15 mins and repeat above if still symptomatic/blood glucose <100.  RECOMMENDATIONS TO REDUCE YOUR RISK OF DIABETIC COMPLICATIONS: * Take your prescribed MEDICATION(S) * Follow a DIABETIC diet: Complex carbs, fiber rich foods, (monounsaturated and polyunsaturated) fats * AVOID saturated/trans fats, high fat foods, >2,300 mg salt per day. * EXERCISE at least 5 times a week for 30 minutes or preferably daily.  * DO NOT SMOKE OR DRINK more than 1 drink a day. * Check your FEET every day. Do not wear tightfitting shoes. Contact us if you develop an ulcer * See your EYE doctor once a year or more if needed * Get a FLU shot once a year * Get a PNEUMONIA vaccine once before and once after age 43 years  GOALS:  * Your Hemoglobin A1c of <7%  * fasting sugars need to be <130 * after meals sugars need to be <180 (2h after you start eating) * Your Systolic BP should be 619 or lower  * Your Diastolic BP should be 80 or lower  * Your HDL (Good Cholesterol) should be 40 or higher  * Your LDL (Bad Cholesterol) should be 100 or lower. * Your Triglycerides should be 150 or lower  * Your Urine microalbumin (kidney function) should be <30 * Your Body Mass Index should be 25 or lower    Please consider the following ways to cut down carbs and fat and increase fiber and micronutrients  in your diet: - substitute whole grain for white bread or pasta - substitute brown rice for white rice - substitute 90-calorie flat bread pieces for slices of bread when possible - substitute sweet potatoes or yams for white potatoes - substitute humus for margarine - substitute tofu for cheese when possible - substitute almond or rice milk for regular milk (would not drink soy milk daily due to concern for soy estrogen influence on breast cancer risk) - substitute dark chocolate for other sweets  when possible - substitute water - can add lemon or orange slices for taste - for diet sodas (artificial sweeteners will trick your body that you can eat sweets without getting calories and will lead you to overeating and weight gain in the long run) - do not skip breakfast or other meals (this will slow down the metabolism and will result in more weight gain over time)  - can try smoothies made from fruit and almond/rice milk in am instead of regular breakfast - can also try old-fashioned (not instant) oatmeal made with almond/rice milk in am - order the dressing on the side when eating salad at a restaurant (pour less than half of the dressing on the salad) - eat as little meat as possible - can try juicing, but should not forget that juicing will get rid of the fiber, so would alternate with eating raw veg./fruits or drinking smoothies - use as little oil as possible, even when using olive oil - can dress a salad with a mix of balsamic vinegar and lemon juice, for e.g. - use agave nectar, stevia sugar, or regular sugar rather than artificial sweateners - steam or broil/roast veggies  - snack on veggies/fruit/nuts (unsalted, preferably) when possible, rather than processed foods - reduce or eliminate aspartame in diet (it is in diet sodas, chewing gum, etc) Read the labels!  Try to read Dr. Janene Harvey book: "Program for Reversing Diabetes" for other ideas for healthy eating.

## 2014-06-11 ENCOUNTER — Inpatient Hospital Stay: Admit: 2014-06-11 | Payer: MEDICARE | Primary: Family Medicine

## 2014-06-11 DIAGNOSIS — Z01818 Encounter for other preprocedural examination: Secondary | ICD-10-CM

## 2014-06-11 LAB — METABOLIC PANEL, COMPREHENSIVE
A-G Ratio: 1.1 (ref 1.1–2.2)
ALT (SGPT): 30 U/L (ref 12–78)
AST (SGOT): 21 U/L (ref 15–37)
Albumin: 3.3 g/dL — ABNORMAL LOW (ref 3.5–5.0)
Alk. phosphatase: 93 U/L (ref 45–117)
Anion gap: 7 mmol/L (ref 5–15)
BUN/Creatinine ratio: 14 (ref 12–20)
BUN: 18 MG/DL (ref 6–20)
Bilirubin, total: 0.3 MG/DL (ref 0.2–1.0)
CO2: 30 mmol/L (ref 21–32)
Calcium: 7.8 MG/DL — ABNORMAL LOW (ref 8.5–10.1)
Chloride: 105 mmol/L (ref 97–108)
Creatinine: 1.29 MG/DL — ABNORMAL HIGH (ref 0.55–1.02)
GFR est AA: 49 mL/min/{1.73_m2} — ABNORMAL LOW (ref >60)
GFR est non-AA: 40 mL/min/{1.73_m2} — ABNORMAL LOW (ref >60)
Globulin: 3 g/dL (ref 2.0–4.0)
Glucose: 76 mg/dL (ref 65–100)
Potassium: 4.2 mmol/L (ref 3.5–5.1)
Protein, total: 6.3 g/dL — ABNORMAL LOW (ref 6.4–8.2)
Sodium: 142 mmol/L (ref 136–145)

## 2014-06-11 LAB — CBC WITH AUTOMATED DIFF
ABS. BASOPHILS: 0.1 10*3/uL (ref 0.0–0.1)
ABS. EOSINOPHILS: 0.3 10*3/uL (ref 0.0–0.4)
ABS. LYMPHOCYTES: 1.4 10*3/uL (ref 0.8–3.5)
ABS. MONOCYTES: 0.4 10*3/uL (ref 0.0–1.0)
ABS. NEUTROPHILS: 4.6 10*3/uL (ref 1.8–8.0)
BASOPHILS: 1 % (ref 0–1)
EOSINOPHILS: 4 % (ref 0–7)
HCT: 37.9 % (ref 35.0–47.0)
HGB: 12.2 g/dL (ref 11.5–16.0)
LYMPHOCYTES: 20 % (ref 12–49)
MCH: 28.4 PG (ref 26.0–34.0)
MCHC: 32.2 g/dL (ref 30.0–36.5)
MCV: 88.3 FL (ref 80.0–99.0)
MONOCYTES: 6 % (ref 5–13)
NEUTROPHILS: 69 % (ref 32–75)
PLATELET: 195 10*3/uL (ref 150–400)
RBC: 4.29 M/uL (ref 3.80–5.20)
RDW: 17.8 % — ABNORMAL HIGH (ref 11.5–14.5)
WBC: 6.7 10*3/uL (ref 3.6–11.0)

## 2014-06-11 LAB — EKG, 12 LEAD, INITIAL
Atrial Rate: 66 {beats}/min
Calculated P Axis: 27 degrees
Calculated R Axis: 12 degrees
Calculated T Axis: 24 degrees
Diagnosis: NORMAL
P-R Interval: 182 ms
Q-T Interval: 418 ms
QRS Duration: 90 ms
QTC Calculation (Bezet): 438 ms
Ventricular Rate: 66 {beats}/min

## 2014-06-11 LAB — C-PEPTIDE: C PEPTIDE: 3.17 ng/mL (ref 0.80–3.90)

## 2014-06-11 NOTE — Other (Signed)
Per orders in chart, ok to continue Aspirin, 06/11/2014.  Patient was aware and told to continue Aspirin until surgery.  DOS 06/17/14

## 2014-06-11 NOTE — Other (Signed)
Faxed abnormal CMP results to Dr. Henriette CombsJohnson's office, 06/11/2014.  DOS 06/17/14

## 2014-06-11 NOTE — Other (Addendum)
[facility]  PREOPERATIVE INSTRUCTIONS    Surgery Date:  Tuesday, 06/17/14  Surgery arrival time given by surgeon: NO   If ???no???,[facility] staff will call you between 4 PM- 8 PM the day before surgery with your arrival time.    Please call [phone] after 8 PM if you did not receive your arrival time.    1. Please report at the designated time to the 2nd Floor Admitting Desk.  Bring your insurance card, photo identification, and any copayment ( if applicable).  2. You must have a responsible adult to drive you home. You need to have a responsible adult to stay with you the first 24 hours after surgery if you are going home the same day of your surgery and you should not drive a car for 24 hours following your surgery.  3. Nothing to eat or drink after midnight the night before surgery. This includes no water, gum, mints, coffee, juice, etc.  Please note special instructions, if applicable, below for medications.  4. MEDICATIONS TO TAKE THE MORNING OF SURGERY WITH A SIP OF WATER: Tramadol if needed, Propranolol, Flonase if needed, Wellbutrin, Protonix, Celexa, Levoxyl, Gabapentin  5. No alcoholic beverages 24 hours before or after your surgery.  6. If you are being admitted to the hospital,please leave personal belongings/luggage in your car until you have an assigned hospital room number.  7. Stop Aspirin (per Dr. Loma NewtonNGO instructions) and/or any non-steroidal anti-inflammatory drugs (i.e. Ibuprofen, Naproxen, Advil, Aleve) as directed by your surgeon.  You may take Tylenol.        Stop herbal supplements 1 week prior to  surgery.  8. If you are currently taking Plavix, Coumadin,or any other blood-thinning/anticoagulant medication contact your surgeon for instructions.  9. Please wear comfortable clothes. Wear your glasses instead of contacts. We ask that all money, jewelry and valuables be left at home. Wear no make up, particularly mascara, the day of surgery.    10.  All body piercings, rings,and jewelry need to be removed and left at home.    Please wear your hair loose or down. Please no pony-tails, buns, or any metal hair accessories. If you shower the morning of surgery, please do not apply any lotions, powders, or deodorants afterwards.   Do not shave any body area within 24 hours of your surgery.  11. Please follow all instructions to avoid any potential surgical cancellation.  12.  Should your physical condition change, (i.e. fever, cold, flu, etc.) please notify your surgeon as soon as possible.  13. It is important to be on time. If a situation occurs where you may be delayed, please call:  [phone] / [phone] on the day of surgery.  14. The Pread[facility] staff can be reached at [phone]..  15. Special instructions: pain scale, DVT prevention, free valet parking 7-5 pm    The patient was contacted  in person.   She  verbalize  understanding of all instructions does not  need reinforcement.

## 2014-06-12 NOTE — Other (Signed)
Telephone call from patient states also takes methenamine for chronic UTI's, instructed to hold the morning of surgery since she takes with vitamin C. Instructed to hold all vitamins and herbs a week before surgery

## 2014-06-16 ENCOUNTER — Telehealth: Payer: Self-pay | Admitting: Internal Medicine

## 2014-06-16 NOTE — Telephone Encounter (Signed)
Please read message below and advise. Thank you.

## 2014-06-16 NOTE — Telephone Encounter (Signed)
Patient called stating that the metformin is giving her diarrhea  As discussed please call in Victoza that was considered as second option   Pharmacy: [facility]   Thank you

## 2014-06-17 ENCOUNTER — Inpatient Hospital Stay: Payer: MEDICARE

## 2014-06-17 ENCOUNTER — Telehealth: Payer: Self-pay | Admitting: Internal Medicine

## 2014-06-17 ENCOUNTER — Telehealth: Payer: Self-pay | Admitting: *Deleted

## 2014-06-17 MED ORDER — LIRAGLUTIDE 18 MG/3ML ~~LOC~~ SOPN: PEN_INJECTOR | SUBCUTANEOUS | Status: DC

## 2014-06-17 MED ORDER — INSULIN PEN NEEDLE 32G X 4 MM MISC: Status: DC

## 2014-06-17 MED ORDER — LIDOCAINE-EPINEPHRINE 1 %-1:100,000 IJ SOLN
1 %-:00,000 | INTRAMUSCULAR | Status: DC | PRN
Start: 2014-06-17 — End: 2014-06-17
  Administered 2014-06-17: 17:00:00 via SUBCUTANEOUS

## 2014-06-17 MED ORDER — SODIUM CHLORIDE 0.9 % IJ SYRG
INTRAMUSCULAR | Status: DC | PRN
Start: 2014-06-17 — End: 2014-06-17

## 2014-06-17 MED ORDER — PROPOFOL 10 MG/ML IV EMUL
10 mg/mL | INTRAVENOUS | Status: DC | PRN
Start: 2014-06-17 — End: 2014-06-17
  Administered 2014-06-17: 16:00:00 via INTRAVENOUS

## 2014-06-17 MED ORDER — HYDROCODONE-ACETAMINOPHEN 5 MG-325 MG TAB
5-325 mg | ORAL_TABLET | ORAL | Status: DC | PRN
Start: 2014-06-17 — End: 2014-07-25

## 2014-06-17 MED ORDER — MIDAZOLAM 1 MG/ML IJ SOLN
1 mg/mL | INTRAMUSCULAR | Status: AC
Start: 2014-06-17 — End: ?

## 2014-06-17 MED ORDER — SODIUM CHLORIDE 0.9 % IJ SYRG
Freq: Three times a day (TID) | INTRAMUSCULAR | Status: DC
Start: 2014-06-17 — End: 2014-06-17

## 2014-06-17 MED ORDER — HYDROCODONE-ACETAMINOPHEN 5 MG-325 MG TAB
5-325 mg | ORAL | Status: DC | PRN
Start: 2014-06-17 — End: 2014-06-17

## 2014-06-17 MED ORDER — ONDANSETRON (PF) 4 MG/2 ML INJECTION
4 mg/2 mL | INTRAMUSCULAR | Status: AC
Start: 2014-06-17 — End: ?

## 2014-06-17 MED ORDER — PROPOFOL 10 MG/ML IV EMUL
10 mg/mL | INTRAVENOUS | Status: AC
Start: 2014-06-17 — End: ?

## 2014-06-17 MED ORDER — WHITE PETROLATUM-MINERAL OIL 83 %-15 % EYE OINTMENT
83-15 % | OPHTHALMIC | Status: AC
Start: 2014-06-17 — End: ?

## 2014-06-17 MED ORDER — BUPIVACAINE (PF) 0.5 % (5 MG/ML) IJ SOLN
0.5 % (5 mg/mL) | INTRAMUSCULAR | Status: DC | PRN
Start: 2014-06-17 — End: 2014-06-17
  Administered 2014-06-17: 17:00:00 via SUBCUTANEOUS

## 2014-06-17 MED ORDER — LIDOCAINE (PF) 10 MG/ML (1 %) IJ SOLN
10 mg/mL (1 %) | INTRAMUSCULAR | Status: DC | PRN
Start: 2014-06-17 — End: 2014-06-17

## 2014-06-17 MED ORDER — CEFAZOLIN 2 GRAM/50 ML NS IVPB
Freq: Once | INTRAVENOUS | Status: DC
Start: 2014-06-17 — End: 2014-06-17

## 2014-06-17 MED ORDER — EPHEDRINE SULFATE 50 MG/ML IJ SOLN
50 mg/mL | INTRAMUSCULAR | Status: AC
Start: 2014-06-17 — End: ?

## 2014-06-17 MED ORDER — LACTATED RINGERS IV
INTRAVENOUS | Status: DC
Start: 2014-06-17 — End: 2014-06-17
  Administered 2014-06-17 (×2): via INTRAVENOUS

## 2014-06-17 MED ORDER — BUPIVACAINE LIPOSOME (PF) 266 MG/20 ML (13.3 MG/ML) SUSP, INFILTRATION
1.3 % (13.3 mg/mL) | Status: AC
Start: 2014-06-17 — End: ?

## 2014-06-17 MED ORDER — ONDANSETRON (PF) 4 MG/2 ML INJECTION
4 mg/2 mL | INTRAMUSCULAR | Status: DC | PRN
Start: 2014-06-17 — End: 2014-06-17
  Administered 2014-06-17: 18:00:00 via INTRAVENOUS

## 2014-06-17 MED ORDER — LIDOCAINE (PF) 20 MG/ML (2 %) IV SYRINGE
100 mg/5 mL (2 %) | INTRAVENOUS | Status: AC
Start: 2014-06-17 — End: ?

## 2014-06-17 MED ORDER — BUPIVACAINE LIPOSOME (PF) 266 MG/20 ML (13.3 MG/ML) SUSP, INFILTRATION
1.3 % (13.3 mg/mL) | Status: DC | PRN
Start: 2014-06-17 — End: 2014-06-17
  Administered 2014-06-17: 18:00:00

## 2014-06-17 MED ORDER — FENTANYL CITRATE (PF) 50 MCG/ML IJ SOLN
50 mcg/mL | INTRAMUSCULAR | Status: AC
Start: 2014-06-17 — End: ?

## 2014-06-17 MED ORDER — BUPIVACAINE (PF) 0.5 % (5 MG/ML) IJ SOLN
0.5 % (5 mg/mL) | INTRAMUSCULAR | Status: AC
Start: 2014-06-17 — End: ?

## 2014-06-17 MED ORDER — LIDOCAINE-EPINEPHRINE 1 %-1:100,000 IJ SOLN
1 %-:00,000 | INTRAMUSCULAR | Status: AC
Start: 2014-06-17 — End: ?

## 2014-06-17 MED ORDER — MIDAZOLAM 1 MG/ML IJ SOLN
1 mg/mL | INTRAMUSCULAR | Status: DC | PRN
Start: 2014-06-17 — End: 2014-06-17
  Administered 2014-06-17: 16:00:00 via INTRAVENOUS

## 2014-06-17 MED ORDER — FENTANYL CITRATE (PF) 50 MCG/ML IJ SOLN
50 mcg/mL | INTRAMUSCULAR | Status: DC | PRN
Start: 2014-06-17 — End: 2014-06-17
  Administered 2014-06-17 (×4): via INTRAVENOUS

## 2014-06-17 MED ORDER — DEXAMETHASONE SODIUM PHOSPHATE 10 MG/ML IJ SOLN
10 mg/mL | INTRAMUSCULAR | Status: DC | PRN
Start: 2014-06-17 — End: 2014-06-17
  Administered 2014-06-17: 17:00:00 via INTRAVENOUS

## 2014-06-17 MED FILL — DIPRIVAN 10 MG/ML INTRAVENOUS EMULSION: 10 mg/mL | INTRAVENOUS | Qty: 50

## 2014-06-17 MED FILL — EPHEDRINE SULFATE 50 MG/ML IJ SOLN: 50 mg/mL | INTRAMUSCULAR | Qty: 1

## 2014-06-17 MED FILL — LIDOCAINE (PF) 20 MG/ML (2 %) IV SYRINGE: 100 mg/5 mL (2 %) | INTRAVENOUS | Qty: 5

## 2014-06-17 MED FILL — FENTANYL CITRATE (PF) 50 MCG/ML IJ SOLN: 50 mcg/mL | INTRAMUSCULAR | Qty: 2

## 2014-06-17 MED FILL — BD POSIFLUSH NORMAL SALINE 0.9 % INJECTION SYRINGE: INTRAMUSCULAR | Qty: 10

## 2014-06-17 MED FILL — LACTATED RINGERS IV: INTRAVENOUS | Qty: 1000

## 2014-06-17 MED FILL — ONDANSETRON (PF) 4 MG/2 ML INJECTION: 4 mg/2 mL | INTRAMUSCULAR | Qty: 4

## 2014-06-17 MED FILL — ARTIFICIAL TEARS (PETROLATUM/MINERAL OIL) 83 %-15 % EYE OINTMENT: 83-15 % | OPHTHALMIC | Qty: 3.5

## 2014-06-17 MED FILL — MIDAZOLAM 1 MG/ML IJ SOLN: 1 mg/mL | INTRAMUSCULAR | Qty: 2

## 2014-06-17 MED FILL — CEFAZOLIN 2 GRAM/50 ML NS IVPB: INTRAVENOUS | Qty: 50

## 2014-06-17 MED FILL — EXPAREL (PF) 1.3 % (13.3 MG/ML) SUSPENSION FOR LOCAL INFILTRATION: 1.3 % (13.3 mg/mL) | Qty: 20

## 2014-06-17 MED FILL — DEXAMETHASONE SODIUM PHOSPHATE 10 MG/ML IJ SOLN: 10 mg/mL | INTRAMUSCULAR | Qty: 8

## 2014-06-17 MED FILL — ONDANSETRON (PF) 4 MG/2 ML INJECTION: 4 mg/2 mL | INTRAMUSCULAR | Qty: 2

## 2014-06-17 MED FILL — LIDOCAINE-EPINEPHRINE 1 %-1:100,000 IJ SOLN: 1 %-:00,000 | INTRAMUSCULAR | Qty: 20

## 2014-06-17 MED FILL — BUPIVACAINE (PF) 0.5 % (5 MG/ML) IJ SOLN: 0.5 % (5 mg/mL) | INTRAMUSCULAR | Qty: 30

## 2014-06-17 MED FILL — DIPRIVAN 10 MG/ML INTRAVENOUS EMULSION: 10 mg/mL | INTRAVENOUS | Qty: 496.8

## 2014-06-17 NOTE — Op Note (Signed)
Name:      Rondel JumboBENNETT, Eliza V                                          Surgeon:        Lerry LinerBryan A Shunda Rabadi, MD  Account #: [id number]                 Surgery Date:   06/17/2014  DOB:       [birth year]  Age:       79                           Location:                                 OPERATIVE REPORT      PREOPERATIVE DIAGNOSIS: Incisional left lumbar hernia.    POSTOPERATIVE DIAGNOSIS: Incisional superior triangle left lumbar hernia.    PROCEDURES PERFORMED: Left lumbar hernia with mesh.    SURGEON: Malachy ChamberBryan Lyndsy Gilberto, MD.    ESTIMATED BLOOD LOSS: Minimal.    ANESTHESIA: MAC.    SPECIMENS REMOVED: None.    FINDINGS: Three cm superior lumbar defect.    COMPLICATIONS: None.    IMPLANTS: A preperitoneal 15 x 10 Parietex  open skirt mesh and a 3 x 6 onlay Prolene hernia mesh.    INDICATIONS: The patient is a 79 year old female with a recent history of  anterior access for a lumbar hernia spine repair. The patient  postoperatively developed a bulging defect in the bony defect along the  left flank.     DESCRIPTION OF PROCEDURE:  After consent was obtained, the patient  was then placed on the operating room table with the left side up on bean bag.   MAC anesthesia was instituted. After successful  Induction, all bony prominences were padded and  her legs were appropriately positioned.  The left flank was prepped and draped  in a normal sterile fashion. Pre-incision antibiotics were given 30  minutes prior to skin incision.    A 6 cm incision was made overlying the pre-marked hernia. Lidocaine 1% with  epinephrine and half percent Marcaine plain were injected into the   incision site. A curvilinear incision was made and carried down through the  fat to the lower back lumbar fascia to expose the defect. The twelfth  rib was palpable superiorly, external oblique was palpated laterally, internal   oblique palpated inferiorly and serratus posterior  muscle was palpated superiorly. With blunt dissection, I was able to clear  the  preperitoneal space underneath muscle layer and under the  12th rib. A 15 x 10 mm skirted mesh was cut to 12 x 10 mm and fashioned to  fit as underlay mesh. The mesh was parachuted into place with multiple   interrupted 3-0 permanent monofilament suture.    With a satisfactory pre-peritoneal approximation, the latissimus dorsi overlying  fascial layer was re-approximated to provide coverage of the fascial  defect with multiple interrupted vicryl suture. To reinforce  the repair, a 3 x 5-inch Prolene mesh was then fashioned as an oval onlay  and was sutured with interrupted 3-0 Prolene suture.   Sponge instrument and needle  counts were correct.    Exparel expanded with 20 mL of saline was then injected along the  dissection plane in the muscle and fascial layers. The subcutaneous fibrofatty tissue layer  was re-approximated with 3-0 vicryl suture.  The dermis was closed with multiple  interrupted 3-0 Vicryl sutures and the skin was closed with a 4-0 running  Monocryl suture, steristrips and tegaderm.     The patient tolerated the procedure well, was awakened and brought to the  PACU in stable condition. I went to discuss the findings with the husband  in the waiting area.          Lerry Liner, MD    cc:   Lerry Liner, MD        BAJ/wmx; D: 06/17/2014 02:12 P; T: 06/17/2014 02:41 P; Doc# [id number]; Job#  [id number]

## 2014-06-17 NOTE — Other (Signed)
Gauze sent home with pt, per Dr Laural BenesJohnson, pt to apply gauze to surgical site PRN and replace abd binder as pt is allergic to tape.  Abd binder in place on D/C, back brace sent home with pt, refused offers to replace brace as on arrival.

## 2014-06-17 NOTE — Discharge Summary (Signed)
Discharge Summary    Patient: Amy Galloway               Sex: female          DOA: 06/17/2014 10:11 AM       Date of Birth:  07/19/1935      Age:  79 y.o.        LOS:                 Discharge Date: 06/17/2014    Admission Diagnoses: LUMBAR HERNIA    Discharge Diagnoses:  LEFT SUPERIOR TRIANGLE LUMBAR HERNIA    Procedure:  Procedure(s):  LEFT LUMBAR  HERNIA REPAIR with mesh    Discharge Condition: Good    Hospital Course: Uremarkable operative procedure.  Discharge to home in stable condition.      Consults: None    Significant Diagnostic Studies: Not applicable    Discharge Medications:     Current Discharge Medication List      START taking these medications    Details   HYDROcodone-acetaminophen (NORCO) 5-325 mg per tablet Take 1-2 Tabs by mouth every four (4) hours as needed. Max Daily Amount: 12 Tabs. Indications: PAIN  Qty: 30 Tab, Refills: 0         CONTINUE these medications which have NOT CHANGED    Details   methenamine hippurate (HIPREX) 1 gram tablet Take 1 g by mouth two (2) times daily (with meals).      gabapentin (NEURONTIN) 300 mg capsule Take 300 mg by mouth two (2) times a day.      hydrochlorothiazide (HYDRODIURIL) 12.5 mg tablet Take 12.5 mg by mouth daily.      traMADol 50 mg TbDL Take  by mouth as needed.    Associated Diagnoses: Palpitation; PAC (premature atrial contraction); PVC (premature ventricular contraction)      pantoprazole (PROTONIX) 20 mg tablet Take 20 mg by mouth daily.      losartan (COZAAR) 25 mg tablet Take  by mouth daily.      B INFANTIS/B ANI/B LON/B BIFID (PROBIOTIC 4X PO) Take  by mouth nightly.      aspirin (ASPIRIN) 325 mg tablet Take 325 mg by mouth daily.      levothyroxine (LEVOXYL) 150 mcg tablet Take 150 mcg by mouth Daily (before breakfast).        propranolol (INDERAL) 20 mg tablet Take 20 mg by mouth three (3) times daily.        citalopram (CELEXA) 20 mg tablet Take 20 mg by mouth daily.         buPROPion SR (WELLBUTRIN SR) 150 mg SR tablet Take 150 mg by mouth two (2) times a day.        ergocalciferol (VITAMIN D2) 50,000 unit capsule Take 50,000 Units by mouth. Twice a month       fluticasone (FLONASE) 50 mcg/actuation nasal spray 2 Sprays by Both Nostrils route as needed.      DOCUSATE SODIUM (COLACE PO) Take 250 mg by mouth two (2) times a day.        PYRIDOXINE HCL (VITAMIN B-6 PO) Take 500 mg by mouth daily.        cyanocobalamin (VITAMIN B-12) 1,000 mcg tablet Take 1,000 mcg by mouth nightly.      CALCIUM CITRATE-VITAMIN D3 PO Take 2 Caps by mouth nightly.      Cholecalciferol, Vitamin D3, (VITAMIN D3) 1,000 unit Cap Take 1 Tab by mouth daily.  Activity/Diet/Wound Care: See patient administered discharge instructions.    Follow-up: 2 weeks    Arnetha Courser, MD  [facility]  Pager:  [phone]  Office:  [phone]

## 2014-06-17 NOTE — Other (Signed)
Pt fall protocol  Yellow arm band on patient, yellow non skid socks on   bed in low position, all side rails up, call bell in reach  Pt  & family instructed in "call don't fall" protocol     Use your call bell,and wait for assistance, staff not family will assist you to get up &   move about  Pt & family verbalize understanding of fall precautions and "call don't fall" protocol    Limb alert sleeve placed on pt's left arm.

## 2014-06-17 NOTE — H&P (Signed)
Patient interviewed and examined again.  No change to paper H/P.    Amy Galloway A Masami Plata, MD

## 2014-06-17 NOTE — Anesthesia Post-Procedure Evaluation (Signed)
Post-Anesthesia Evaluation and Assessment    Patient: Amy Galloway MRN: [id number]  SSN: [id number]    Date of Birth: 01/25/1936  Age: 79 y.o.  Sex: female       Cardiovascular Function/Vital Signs  Visit Vitals   Item Reading   ??? BP 125/61 mmHg   ??? Pulse 84   ??? Temp 36.7 ??C (98 ??F)   ??? Resp 16   ??? Ht 5\' 4"  (1.626 m)   ??? Wt 92 kg (202 lb 13.2 oz)   ??? BMI 34.80 kg/m2   ??? SpO2 93%       Patient is status post MAC anesthesia for Procedure(s):  LEFT LUMBAR  HERNIA REPAIR with mesh.    Nausea/Vomiting: None    Postoperative hydration reviewed and adequate.    Pain:  Pain Scale 1: Numeric (0 - 10) (06/17/14 1430)  Pain Intensity 1: 0 (06/17/14 1430)   Managed    Neurological Status:   Neuro (WDL): Within Defined Limits (06/17/14 1430)  Neuro  LUE Motor Response: Purposeful (06/17/14 1430)  LLE Motor Response: Purposeful (06/17/14 1430)  RUE Motor Response: Purposeful (06/17/14 1430)  RLE Motor Response: Purposeful (06/17/14 1430)   At baseline    Mental Status and Level of Consciousness: Alert and oriented     Pulmonary Status:   O2 Device: Room air (06/17/14 1444)   Adequate oxygenation and airway patent    Complications related to anesthesia: None    Post-anesthesia assessment completed. No concerns    Signed By: Villa HerbMichelle  Ethin Drummond, MD     June 17, 2014

## 2014-06-17 NOTE — Anesthesia Pre-Procedure Evaluation (Addendum)
Anesthetic History     PONV          Review of Systems / Medical History  Patient summary reviewed, nursing notes reviewed and pertinent labs reviewed    Pulmonary  Within defined limits                 Neuro/Psych   Within defined limits           Cardiovascular    Hypertension        Dysrhythmias : SVT      Exercise tolerance: >4 METS     GI/Hepatic/Renal     GERD           Endo/Other      Hypothyroidism  Arthritis     Other Findings            Physical Exam    Airway  Mallampati: II  TM Distance: 4 - 6 cm  Neck ROM: normal range of motion   Mouth opening: Normal     Cardiovascular    Rhythm: regular  Rate: normal         Dental    Dentition: Lower dentition intact and Upper dentition intact     Pulmonary  Breath sounds clear to auscultation               Abdominal         Other Findings            Anesthetic Plan    ASA: 3  Anesthesia type: MAC          Induction: Intravenous  Anesthetic plan and risks discussed with: Patient

## 2014-06-17 NOTE — Op Note (Signed)
Name:      Amy Galloway, [name]                                          Surgeon:        Lerry LinerBryan A Aizlyn Schifano, MD  Account #: [id number]                 Surgery Date:   06/17/2014  DOB:       1935/04/24  Age:       79                           Location:                                 OPERATIVE REPORT      PREOPERATIVE DIAGNOSIS: Incisional left lumbar hernia.    POSTOPERATIVE DIAGNOSIS: Incisional superior triangle left lumbar hernia.    PROCEDURES PERFORMED: Left lumbar hernia with mesh.    SURGEON: Malachy ChamberBryan Daralyn Bert, MD.    ESTIMATED BLOOD LOSS: Minimal.    ANESTHESIA: MAC.    SPECIMENS REMOVED: None.    FINDINGS: Three cm superior lumbar defect.    COMPLICATIONS: None.    IMPLANTS: A preperitoneal 15 x 10 Parietex  open skirt mesh and a 3 x 6 onlay Prolene hernia mesh.    INDICATIONS: The patient is a 79 year old female with a recent history of  anterior access for a lumbar hernia spine repair. The patient  postoperatively developed a bulging defect in the bony defect along the  left flank.     DESCRIPTION OF PROCEDURE:  After consent was obtained, the patient  was then placed on the operating room table with the left side up on bean bag.   MAC anesthesia was instituted. After successful  Induction, all bony prominences were padded and  her legs were appropriately positioned.  The left flank was prepped and draped  in a normal sterile fashion. Pre-incision antibiotics were given 30  minutes prior to skin incision.    A 6 cm incision was made overlying the pre-marked hernia. Lidocaine 1% with  epinephrine and half percent Marcaine plain were injected into the   incision site. A curvilinear incision was made and carried down through the  fat to the lower back lumbar fascia to expose the defect. The twelfth  rib was palpable superiorly, external oblique was palpated laterally, internal   oblique palpated inferiorly and serratus posterior  muscle was palpated superiorly. With blunt dissection, I was able to clear   the preperitoneal space underneath muscle layer and under the  12th rib. A 15 x 10 mm skirted mesh was cut to 12 x 10 mm and fashioned to  fit as underlay mesh. The mesh was parachuted into place with multiple   interrupted 3-0 permanent monofilament suture.    With a satisfactory pre-peritoneal approximation, the latissimus dorsi overlying  fascial layer was re-approximated to provide coverage of the fascial  defect with multiple interrupted vicryl suture. To reinforce  the repair, a 3 x 5-inch Prolene mesh was then fashioned as an oval onlay  and was sutured with interrupted 3-0 Prolene suture.   Sponge instrument and needle  counts were correct.    Exparel expanded with 20 mL of saline was then injected along the  dissection plane in the muscle and fascial layers. The subcutaneous fibrofatty tissue layer  was re-approximated with 3-0 vicryl suture.  The dermis was closed with multiple  interrupted 3-0 Vicryl sutures and the skin was closed with a 4-0 running  Monocryl suture, steristrips and tegaderm.     The patient tolerated the procedure well, was awakened and brought to the  PACU in stable condition. I went to discuss the findings with the husband  in the waiting area.          Lerry Liner, MD    cc:   Lerry Liner, MD        BAJ/wmx; D: 06/17/2014 02:12 P; T: 06/17/2014 02:41 P; Doc# [id number]; Job#  [id number]

## 2014-06-17 NOTE — Brief Op Note (Addendum)
BRIEF OPERATIVE NOTE    Date of Procedure: 06/17/2014   Preoperative Diagnosis: INCISIONAL LUMBAR HERNIA  Postoperative Diagnosis: INCISIONAL SUPERIORTRIANGLE LUMBAR HERNIA    Procedure(s):  LEFT LUMBAR  HERNIA REPAIR with mesh  Surgeon(s) and Role:     Lerry Liner* Danh Bayus A Marlise Fahr, MD - Primary  Anesthesia: MAC   Estimated Blood Loss: Minmium  Specimens: * No specimens in log *   Findings: 3cm defect  Complications: none  Implants:   Implant Name Type Inv. Item Serial No. Manufacturer Lot No. LRB No. Used Action   MESH OPEN SKIRT 15X10MM -- PARIETEX - [id number]  MESH OPEN SKIRT 15X10MM -- PARIETEX n/a [employer] [id number] Left 1 Implanted   MESH HERN PROL 3X6IN --  - [id number]   MESH HERN PROL 3X6IN --  n/a [employer] [id number] Left 1 Implanted     [id number]

## 2014-06-17 NOTE — Telephone Encounter (Signed)
Pt requested lab results. Advised pt that all were not back yet. Pt understood. Called [employer]. Only the C-peptide has been resulted. The other 2 are still in process. Be advised.

## 2014-06-17 NOTE — Telephone Encounter (Signed)
Please call pt back regarding pt medication , the medication is casuing problems for her,  Pt says this is her 2nd calling requesting a call over this  Call  [phone]

## 2014-06-17 NOTE — Telephone Encounter (Signed)
Called pt and advised her per Dr Arman Filter message. Pt voiced understanding. Rx for Victoza and pen needles sent to pt's pharmacy.

## 2014-06-17 NOTE — Telephone Encounter (Signed)
Called pt and advised her that Dr Cruzita Lederer was out of town yesterday and unable to respond to the message for yesterday. Advised pt will call her when Dr Cruzita Lederer advises what to do. Pt voiced understanding.

## 2014-06-17 NOTE — Telephone Encounter (Signed)
OK to stop Metformin and start Victoza 0.6 mg daily x 1 week, then increase to 1.2 mg daily. Stay on this dose afterwards. Can send 3 pens with 2 refills. May need needles, too.

## 2014-06-18 LAB — ANTI-ISLET CELL ANTIBODY: Pancreatic Islet Cell Antibody: <5 JDF Units (ref <5)

## 2014-06-20 ENCOUNTER — Inpatient Hospital Stay (HOSPITAL_COMMUNITY)
Admission: EM | Admit: 2014-06-20 | Discharge: 2014-06-24 | DRG: 372 | Disposition: A | Discharge status: Home / Self Care | Payer: PPO | Attending: Internal Medicine | Admitting: Internal Medicine

## 2014-06-20 ENCOUNTER — Encounter (HOSPITAL_COMMUNITY): Payer: Self-pay | Admitting: *Deleted

## 2014-06-20 ENCOUNTER — Emergency Department (HOSPITAL_COMMUNITY): Payer: PPO

## 2014-06-20 DIAGNOSIS — R109 Unspecified abdominal pain: Secondary | ICD-10-CM

## 2014-06-20 DIAGNOSIS — Z85118 Personal history of other malignant neoplasm of bronchus and lung: Secondary | ICD-10-CM

## 2014-06-20 DIAGNOSIS — R1084 Generalized abdominal pain: Secondary | ICD-10-CM

## 2014-06-20 DIAGNOSIS — R062 Wheezing: Secondary | ICD-10-CM

## 2014-06-20 DIAGNOSIS — I5032 Chronic diastolic (congestive) heart failure: Secondary | ICD-10-CM | POA: Diagnosis present

## 2014-06-20 DIAGNOSIS — M810 Age-related osteoporosis without current pathological fracture: Secondary | ICD-10-CM | POA: Diagnosis present

## 2014-06-20 DIAGNOSIS — R140 Abdominal distension (gaseous): Secondary | ICD-10-CM

## 2014-06-20 DIAGNOSIS — F17210 Nicotine dependence, cigarettes, uncomplicated: Secondary | ICD-10-CM | POA: Diagnosis present

## 2014-06-20 DIAGNOSIS — K760 Fatty (change of) liver, not elsewhere classified: Secondary | ICD-10-CM | POA: Diagnosis present

## 2014-06-20 DIAGNOSIS — Z8249 Family history of ischemic heart disease and other diseases of the circulatory system: Secondary | ICD-10-CM

## 2014-06-20 DIAGNOSIS — F419 Anxiety disorder, unspecified: Secondary | ICD-10-CM | POA: Diagnosis present

## 2014-06-20 DIAGNOSIS — Z923 Personal history of irradiation: Secondary | ICD-10-CM

## 2014-06-20 DIAGNOSIS — Z823 Family history of stroke: Secondary | ICD-10-CM

## 2014-06-20 DIAGNOSIS — E119 Type 2 diabetes mellitus without complications: Secondary | ICD-10-CM

## 2014-06-20 DIAGNOSIS — A047 Enterocolitis due to Clostridium difficile: Secondary | ICD-10-CM | POA: Diagnosis not present

## 2014-06-20 DIAGNOSIS — J449 Chronic obstructive pulmonary disease, unspecified: Secondary | ICD-10-CM | POA: Diagnosis present

## 2014-06-20 DIAGNOSIS — Z8572 Personal history of non-Hodgkin lymphomas: Secondary | ICD-10-CM | POA: Diagnosis not present

## 2014-06-20 DIAGNOSIS — I10 Essential (primary) hypertension: Secondary | ICD-10-CM | POA: Diagnosis present

## 2014-06-20 DIAGNOSIS — K219 Gastro-esophageal reflux disease without esophagitis: Secondary | ICD-10-CM | POA: Diagnosis present

## 2014-06-20 DIAGNOSIS — A0472 Enterocolitis due to Clostridium difficile, not specified as recurrent: Secondary | ICD-10-CM | POA: Diagnosis present

## 2014-06-20 DIAGNOSIS — K529 Noninfective gastroenteritis and colitis, unspecified: Secondary | ICD-10-CM | POA: Diagnosis present

## 2014-06-20 DIAGNOSIS — I519 Heart disease, unspecified: Secondary | ICD-10-CM | POA: Diagnosis not present

## 2014-06-20 DIAGNOSIS — R112 Nausea with vomiting, unspecified: Secondary | ICD-10-CM | POA: Diagnosis present

## 2014-06-20 DIAGNOSIS — I5189 Other ill-defined heart diseases: Secondary | ICD-10-CM | POA: Diagnosis present

## 2014-06-20 HISTORY — DX: Enterocolitis due to Clostridium difficile, not specified as recurrent: A04.72

## 2014-06-20 LAB — CLOSTRIDIUM DIFFICILE BY PCR: CDIFFPCR: POSITIVE — AB

## 2014-06-20 LAB — COMPREHENSIVE METABOLIC PANEL
ALK PHOS: 81 U/L (ref 39–117)
ALT: 11 U/L (ref 0–35)
ANION GAP: 10 (ref 5–15)
AST: 18 U/L (ref 0–37)
Albumin: 4.1 g/dL (ref 3.5–5.2)
BUN: 14 mg/dL (ref 6–23)
CALCIUM: 9.5 mg/dL (ref 8.4–10.5)
CO2: 25 mmol/L (ref 19–32)
CREATININE: 0.85 mg/dL (ref 0.50–1.10)
Chloride: 103 mmol/L (ref 96–112)
GFR calc Af Amer: 75 mL/min — ABNORMAL LOW (ref >90)
GFR calc non Af Amer: 64 mL/min — ABNORMAL LOW (ref >90)
GLUCOSE: 206 mg/dL — AB (ref 70–99)
Potassium: 3.9 mmol/L (ref 3.5–5.1)
Sodium: 138 mmol/L (ref 135–145)
Total Bilirubin: 0.8 mg/dL (ref 0.3–1.2)
Total Protein: 6.8 g/dL (ref 6.0–8.3)

## 2014-06-20 LAB — URINALYSIS, ROUTINE W REFLEX MICROSCOPIC
BILIRUBIN URINE: NEGATIVE
GLUCOSE, UA: NEGATIVE mg/dL
KETONES UR: NEGATIVE mg/dL
Leukocytes, UA: NEGATIVE
NITRITE: NEGATIVE
PH: 5 (ref 5.0–8.0)
Protein, ur: NEGATIVE mg/dL
Specific Gravity, Urine: <1.005 — ABNORMAL LOW (ref 1.005–1.030)
UROBILINOGEN UA: 0.2 mg/dL (ref 0.0–1.0)

## 2014-06-20 LAB — URINE MICROSCOPIC-ADD ON

## 2014-06-20 LAB — CBC
HCT: 43.3 % (ref 36.0–46.0)
Hemoglobin: 14.8 g/dL (ref 12.0–15.0)
MCH: 31.3 pg (ref 26.0–34.0)
MCHC: 34.2 g/dL (ref 30.0–36.0)
MCV: 91.5 fL (ref 78.0–100.0)
Platelets: 260 10*3/uL (ref 150–400)
RBC: 4.73 MIL/uL (ref 3.87–5.11)
RDW: 13.2 % (ref 11.5–15.5)
WBC: 10.4 10*3/uL (ref 4.0–10.5)

## 2014-06-20 LAB — GLUTAMIC ACID DECARBOXYLASE AUTO ABS

## 2014-06-20 LAB — GLUCOSE, CAPILLARY: Glucose-Capillary: 137 mg/dL — ABNORMAL HIGH (ref 70–99)

## 2014-06-20 LAB — LIPASE, BLOOD: LIPASE: 26 U/L (ref 11–59)

## 2014-06-20 MED ORDER — ONDANSETRON HCL 4 MG/2ML IJ SOLN
4.0000 mg | Freq: Four times a day (QID) | INTRAMUSCULAR | Status: DC | PRN
  Administered 2014-06-20 – 2014-06-24 (×9): 4 mg via INTRAVENOUS
  Filled 2014-06-20 (×9): qty 2

## 2014-06-20 MED ORDER — SODIUM CHLORIDE 0.9 % IV BOLUS (SEPSIS)
500.0000 mL | Freq: Once | INTRAVENOUS | Status: AC
  Administered 2014-06-20: 500 mL via INTRAVENOUS

## 2014-06-20 MED ORDER — ALPRAZOLAM 0.5 MG PO TABS
0.5000 mg | ORAL_TABLET | Freq: Three times a day (TID) | ORAL | Status: DC | PRN
  Administered 2014-06-20 – 2014-06-23 (×5): 0.5 mg via ORAL
  Filled 2014-06-20 (×5): qty 1

## 2014-06-20 MED ORDER — IOHEXOL 300 MG/ML  SOLN
100.0000 mL | Freq: Once | INTRAMUSCULAR | Status: AC | PRN
Start: 2014-06-20 — End: 2014-06-20
  Administered 2014-06-20: 100 mL via INTRAVENOUS

## 2014-06-20 MED ORDER — ALBUTEROL SULFATE HFA 108 (90 BASE) MCG/ACT IN AERS
2.0000 | INHALATION_SPRAY | Freq: Four times a day (QID) | RESPIRATORY_TRACT | Status: DC | PRN
  Filled 2014-06-20: qty 6.7

## 2014-06-20 MED ORDER — ALBUTEROL SULFATE (2.5 MG/3ML) 0.083% IN NEBU: 3.0000 mL | INHALATION_SOLUTION | Freq: Four times a day (QID) | RESPIRATORY_TRACT | Status: DC | PRN

## 2014-06-20 MED ORDER — HEPARIN SODIUM (PORCINE) 5000 UNIT/ML IJ SOLN
5000.0000 [IU] | Freq: Three times a day (TID) | INTRAMUSCULAR | Status: DC
  Administered 2014-06-20 – 2014-06-24 (×11): 5000 [IU] via SUBCUTANEOUS
  Filled 2014-06-20 (×11): qty 1

## 2014-06-20 MED ORDER — FENTANYL CITRATE (PF) 100 MCG/2ML IJ SOLN
25.0000 ug | Freq: Once | INTRAMUSCULAR | Status: AC
  Administered 2014-06-20: 25 ug via INTRAVENOUS
  Filled 2014-06-20: qty 2

## 2014-06-20 MED ORDER — POTASSIUM CHLORIDE IN NACL 20-0.9 MEQ/L-% IV SOLN
INTRAVENOUS | Status: DC
  Administered 2014-06-20: 1 mL via INTRAVENOUS
  Administered 2014-06-21 – 2014-06-24 (×4): via INTRAVENOUS

## 2014-06-20 MED ORDER — VITAMIN B-12 1000 MCG PO TABS
500.0000 ug | ORAL_TABLET | Freq: Every day | ORAL | Status: DC
  Administered 2014-06-21 – 2014-06-24 (×4): 500 ug via ORAL
  Filled 2014-06-20 (×4): qty 1

## 2014-06-20 MED ORDER — METOPROLOL SUCCINATE ER 50 MG PO TB24: 100.0000 mg | ORAL_TABLET | Freq: Every morning | ORAL | Status: DC

## 2014-06-20 MED ORDER — LIRAGLUTIDE 18 MG/3ML ~~LOC~~ SOPN
0.6000 mg | PEN_INJECTOR | Freq: Two times a day (BID) | SUBCUTANEOUS | Status: DC
  Filled 2014-06-20 (×6): qty 0.1

## 2014-06-20 MED ORDER — INSULIN ASPART 100 UNIT/ML ~~LOC~~ SOLN: 0.0000 [IU] | Freq: Every day | SUBCUTANEOUS | Status: DC

## 2014-06-20 MED ORDER — CITALOPRAM HYDROBROMIDE 20 MG PO TABS
40.0000 mg | ORAL_TABLET | Freq: Every day | ORAL | Status: DC
  Administered 2014-06-21 – 2014-06-24 (×4): 40 mg via ORAL
  Filled 2014-06-20 (×2): qty 1
  Filled 2014-06-20: qty 2
  Filled 2014-06-20: qty 1
  Filled 2014-06-20 (×3): qty 2

## 2014-06-20 MED ORDER — INSULIN ASPART 100 UNIT/ML ~~LOC~~ SOLN
0.0000 [IU] | Freq: Three times a day (TID) | SUBCUTANEOUS | Status: DC
  Administered 2014-06-21 – 2014-06-23 (×3): 2 [IU] via SUBCUTANEOUS

## 2014-06-20 MED ORDER — ONDANSETRON HCL 4 MG PO TABS: 4.0000 mg | ORAL_TABLET | Freq: Four times a day (QID) | ORAL | Status: DC | PRN

## 2014-06-20 MED ORDER — GI COCKTAIL ~~LOC~~
30.0000 mL | Freq: Once | ORAL | Status: AC
  Administered 2014-06-20: 30 mL via ORAL
  Filled 2014-06-20: qty 30

## 2014-06-20 MED ORDER — LOSARTAN POTASSIUM 50 MG PO TABS: 50.0000 mg | ORAL_TABLET | Freq: Every morning | ORAL | Status: DC

## 2014-06-20 MED ORDER — SODIUM CHLORIDE 0.9 % IV SOLN: INTRAVENOUS | Status: DC

## 2014-06-20 MED ORDER — ONDANSETRON HCL 4 MG/2ML IJ SOLN
4.0000 mg | Freq: Once | INTRAMUSCULAR | Status: AC
  Administered 2014-06-20: 4 mg via INTRAVENOUS
  Filled 2014-06-20: qty 2

## 2014-06-20 MED ORDER — ASPIRIN EC 81 MG PO TBEC
81.0000 mg | DELAYED_RELEASE_TABLET | Freq: Every morning | ORAL | Status: DC
  Administered 2014-06-21 – 2014-06-24 (×4): 81 mg via ORAL
  Filled 2014-06-20 (×4): qty 1

## 2014-06-20 MED ORDER — ONDANSETRON HCL 4 MG/2ML IJ SOLN
4.0000 mg | Freq: Once | INTRAMUSCULAR | Status: AC
Start: 2014-06-20 — End: 2014-06-20
  Administered 2014-06-20: 4 mg via INTRAVENOUS
  Filled 2014-06-20: qty 2

## 2014-06-20 NOTE — ED Notes (Signed)
Report attempted. Unable to give report at this time.

## 2014-06-20 NOTE — ED Notes (Signed)
Pt cleaned and changed to hospital gown.

## 2014-06-20 NOTE — ED Notes (Signed)
CBG of 184 en route by EMS

## 2014-06-20 NOTE — ED Notes (Signed)
N/V/D began at 0600 this morning. Burning sensation to epigastric area, per EMS. Zofran '4mg'$  IV given en route.

## 2014-06-20 NOTE — H&P (Signed)
[facility] History and Physical  Marie Holmes:[id number] DOB: April 05, 1935 DOA: 06/20/2014  Referring physician: ER PCP: Arnette Norris, MD   Chief Complaint: Diarrhea, vomiting.  HPI: Marie Holmes is a 79 y.o. female  This is a 79 year old lady who started to have vomiting and diarrhea this morning. Mostly the diarrhea has been loose stools but she noted some blood on the toilet paper. She has also had some abdominal discomfort with the diarrhea. She has had no fever although she does feel that she has chills. She denies any contacts recently with similar symptoms. She does not feel lightheaded or dizzy but does feel somewhat weak. She is now being admitted for further management.   Review of Systems:  Apart from symptoms above, all systems negative.  Past Medical History  Diagnosis Date  . Benign tumor of breast 2004    left  . Broken ribs     history of several broken ribs on right and compression  fx  . Fibroma of skin     Multiple fibromas/lipomas on arms and legs B  . Anxiety   . COPD (chronic obstructive pulmonary disease)   . Hyperlipidemia   . History of CT scan of abdomen 05/23/2003    Mild fatty liver inf, tiny hypodensity in right lobe of liver  . Pulmonary nodules   . Diabetes mellitus without complication   . Hx of radiation therapy 04/11/12- 05/08/12    left chest wall area, 40 gray 20 fx  . Hypertension   . Diverticulosis   . Adenomatous colon polyp   . Cancer     non-hodgkins b cell lymphoma  . 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 Depression   . Pyelonephritis   . Kidney stone 02/21/1981    Right/removed  . GERD (gastroesophageal reflux disease)   . Osteoarthritis     hands, degenerative back , osteoporosis   . Cold virus     seen by Dr. Deborra Medina on 10/12, given Rx for antibiotic & steroid  . CHF (congestive heart failure)     hosp. for CHF- 10/2013  . Shortness of breath     only with movemnent     Past Surgical History  Procedure Laterality Date  . Appendectomy  02/21/1961  . Exploratory laparotomy  02/21/1970    for ? adhesions  . Abdominal hysterectomy  02/21/1970    right ovary remaining  . Bone marrow biopsy  03/09/2012  . Lung core biopsy  02/06/2012  . Tonsillectomy    . Breast surgery  1970's    lump removed- benign   . Tubal ligation    . Eye surgery      bilateral cataracts removed - /W- IOL  . Cholecystectomy N/A 12/11/2013    Procedure: LAPAROSCOPIC CHOLECYSTECTOMY WITH INTRAOPERATIVE CHOLANGIOGRAM;  Surgeon: Erroll Luna, MD;  Location: [facility];  Service: General;  Laterality: N/A;   Social History:  reports that she has been smoking Cigarettes.  She has a 15 pack-year smoking history. She has never used smokeless tobacco. She reports that she does not drink alcohol or use illicit drugs.  Allergies  Allergen Reactions  . Sulfonamide Derivatives Anaphylaxis  . Metformin And Related Diarrhea    Family History  Problem Relation Age of Onset  . Parkinsonism Brother   . Stroke Father   . Cancer Sister   . Heart disease Brother   . Clotting disorder Father   . Kidney disease Mother  Prior to Admission medications   Medication Sig Start Date End Date Taking? Authorizing Provider  albuterol (PROVENTIL HFA;VENTOLIN HFA) 108 (90 BASE) MCG/ACT inhaler Inhale 2 puffs into the lungs every 6 (six) hours as needed for wheezing or shortness of breath.   Yes Historical Provider, MD  ALPRAZolam Duanne Moron) 0.5 MG tablet Take 1 tablet (0.5 mg total) by mouth 3 (three) times daily as needed for anxiety. 05/27/14  Yes Lucille Passy, MD  aspirin EC 81 MG tablet Take 81 mg by mouth every morning.   Yes Historical Provider, MD  citalopram (CELEXA) 40 MG tablet Take 1 tablet (40 mg total) by mouth daily. 02/27/14  Yes Lucille Passy, MD  Liraglutide 18 MG/3ML SOPN Inject 0.6 mg daily x 1 week, then increase to 1.2 mg daily. Patient taking differently: Inject 0.6 mg into the skin 2  (two) times daily.  06/17/14  Yes Philemon Kingdom, MD  losartan (COZAAR) 50 MG tablet Take 1 tablet (50 mg total) by mouth every morning. 12/26/13  Yes Lucille Passy, MD  metoprolol succinate (TOPROL-XL) 100 MG 24 hr tablet Take 1 tablet (100 mg total) by mouth every morning. 12/26/13  Yes Lucille Passy, MD  vitamin B-12 (CYANOCOBALAMIN) 500 MCG tablet Take 500 mcg by mouth daily.   Yes Historical Provider, MD  Blood Glucose Monitoring Suppl ([place]) W/DEVICE KIT Use as directed to test blood sugar three times daily Dx:R73.09 05/01/14   Lucille Passy, MD  furosemide (LASIX) 40 MG tablet Take 1 tablet (40 mg total) by mouth daily as needed (swelling, shortness of breath). Patient not taking: Reported on 06/10/2014 11/02/13   Kathie Dike, MD  glipiZIDE (GLIPIZIDE XL) 5 MG 24 hr tablet Take 1 tablet (5 mg total) by mouth daily with breakfast. Patient not taking: Reported on 06/20/2014 06/10/14 06/10/15  Philemon Kingdom, MD  glucose blood test strip Use as instructed 05/01/14   Lucille Passy, MD  Insulin Pen Needle 32G X 4 MM MISC Use to inject once daily. 06/17/14   Philemon Kingdom, MD  ONE TOUCH LANCETS MISC Use as directed to test blood sugar three times daily Dx:R73.09 05/01/14   Lucille Passy, MD  pantoprazole (PROTONIX) 40 MG tablet Take 1 tablet (40 mg total) by mouth 2 (two) times daily. Patient not taking: Reported on 06/10/2014 04/02/14   Lucille Passy, MD  simvastatin (ZOCOR) 10 MG tablet Take 1 tablet (10 mg total) by mouth at bedtime. Patient not taking: Reported on 06/20/2014 04/02/14   Lucille Passy, MD   Physical Exam: Filed Vitals:   06/20/14 1244 06/20/14 1512 06/20/14 1601  BP: 104/66 122/69 132/67  Pulse: 86 78 88  Temp: 97.5 F (36.4 C)    TempSrc: Oral    Resp: _0 Height: _1  (1.549 m)    Weight: 54.432 kg (120 lb)    SpO2: 100% 98% 97%    Wt Readings from Last 3 Encounters:  06/20/14 54.432 kg (120 lb)  06/10/14 54.432 kg (120 lb)  04/21/14 56.155 kg (123  lb 12.8 oz)    General:  Appears slightly dehydrated but nontoxic clinically. Eyes: PERRL, normal lids, irises & conjunctiva ENT: grossly normal hearing, lips & tongue Neck: no LAD, masses or thyromegaly Cardiovascular: RRR, no m/r/g. No LE edema. Telemetry: SR, no arrhythmias  Respiratory: CTA bilaterally, no w/r/r. Normal respiratory effort. Abdomen: soft, slight tenderness just in a generalized fashion. No clear acute abdomen. Skin: no rash or induration  seen on limited exam Musculoskeletal: grossly normal tone BUE/BLE Psychiatric: grossly normal mood and affect, speech fluent and appropriate Neurologic: grossly non-focal.          Labs on Admission:  Basic Metabolic Panel:  Recent Labs Lab 06/20/14 1325  NA 138  K 3.9  CL 103  CO2 25  GLUCOSE 206*  BUN 14  CREATININE 0.85  CALCIUM 9.5   Liver Function Tests:  Recent Labs Lab 06/20/14 1325  AST 18  ALT 11  ALKPHOS 81  BILITOT 0.8  PROT 6.8  ALBUMIN 4.1    Recent Labs Lab 06/20/14 1325  LIPASE 26   No results for input(s): AMMONIA in the last 168 hours. CBC:  Recent Labs Lab 06/20/14 1325  WBC 10.4  HGB 14.8  HCT 43.3  MCV 91.5  PLT 260   Cardiac Enzymes: No results for input(s): CKTOTAL, CKMB, CKMBINDEX, TROPONINI in the last 168 hours.  BNP (last 3 results) No results for input(s): BNP in the last 8760 hours.  ProBNP (last 3 results)  Recent Labs  10/30/13 0549  PROBNP 3093.0*    CBG: No results for input(s): GLUCAP in the last 168 hours.  Radiological Exams on Admission: Ct Abdomen Pelvis W Contrast  06/20/2014   CLINICAL DATA:  Nausea, vomiting, diarrhea. Sweats since 6 a.m. today. History of kidney stones, COPD, non-Hodgkin's B-cell lymphoma, CHF, lung cancer.  EXAM: CT ABDOMEN AND PELVIS WITH CONTRAST  TECHNIQUE: Multidetector CT imaging of the abdomen and pelvis was performed using the standard protocol following bolus administration of intravenous contrast.  CONTRAST:  169m  OMNIPAQUE IOHEXOL 300 MG/ML  SOLN  COMPARISON:  10/27/2013  FINDINGS: Lower chest: Lung bases are unremarkable.  Heart size is normal.  Upper abdomen: No focal abnormality identified within the liver, spleen, pancreas, or adrenal glands. Gallbladder is surgically absent.  Gastrointestinal tract: Stomach and small bowel loops are normal in appearance. The appendix is surgically absent. Colonic loops are fluid-filled, consistent with the history of diarrhea. Additionally, there appears to be mucosal enhancement of the transverse, descending, and sigmoid colon raising the question of colitis. No focal colonic lesions are identified.  Pelvis: Status post hysterectomy.  No adnexal mass identified.  Retroperitoneum: No retroperitoneal or mesenteric adenopathy. The aorta is densely calcified. No abdominal aortic aneurysm.  Abdominal wall: Unremarkable.  Osseous structures: No suspicious lytic or blastic lesions are identified. There is a superior endplate fracture of TX09 consistent osteoporotic type fracture.  IMPRESSION: 1. Fluid-filled colonic loops and enhancement of the distal colonic wall, raising the question of colitis. No focal colonic lesions identified. 2. Densely calcified abdominal aorta without aneurysm. 3. Osteoporotic type superior endplate fracture of TU04 4. Status post hysterectomy and appendectomy.   Electronically Signed   By: ENolon NationsM.D.   On: 06/20/2014 17:09      Assessment/Plan   1. Gastroenteritis. CT scan raises the possibility of colitis but she does not have an increased white count, no fever. Seems that her symptoms are secondary to a viral process. We will treated with IV fluids and conservative measures, clear liquid diet. If she spikes a fever then she may warrant antibiotics. 2. Hypertension. Stable. 3. Diabetes. Sliding scale of insulin.  Further recommendations will depend on patient's hospital progress.   Code Status: Full code   DVT Prophylaxis:  Heparin.  Family Communication: I discussed the plan with the patient at the bedside.   Disposition Plan: Home when medically stable.   Time spent: 45 minutes.  [place]  [facility] Pager [phone].

## 2014-06-20 NOTE — Addendum Note (Signed)
Addended by: Philemon Kingdom on: 06/20/2014 11:08 AM   Modules accepted: Level of Service

## 2014-06-20 NOTE — Progress Notes (Signed)
Xanax 0.'5mg'$  given per patient request for anxiety

## 2014-06-20 NOTE — ED Provider Notes (Signed)
CSN: [id number]     Arrival date & time 06/20/14  1240 History   First MD Initiated Contact with Patient 06/20/14 1309     Chief Complaint  Patient presents with  . Emesis     (Consider location/radiation/quality/duration/timing/severity/associated sxs/prior Treatment) Patient is a 79 y.o. female presenting with vomiting. The history is provided by the patient.  Emesis Associated symptoms: abdominal pain and diarrhea   Associated symptoms: no headaches    patient with acute onset of the nausea vomiting and diarrhea and then epigastric abdominal pain then became more generalized starting about 6 this morning. Patient with about 5 episodes of vomiting prior to arrival in about 10 episodes of diarrhea. Patient's had multiple abdominal surgeries in the past to include the appendix which is ruptured removal of the gallbladder. And hysterectomy. Patient states that she felt fine yesterday. The abdominal pain is burning in nature and does radiate to the back it's about 8 out of 10.  Past Medical History  Diagnosis Date  . Benign tumor of breast 2004    left  . Broken ribs     history of several broken ribs on right and compression  fx  . Fibroma of skin     Multiple fibromas/lipomas on arms and legs B  . Anxiety   . COPD (chronic obstructive pulmonary disease)   . Hyperlipidemia   . History of CT scan of abdomen 05/23/2003    Mild fatty liver inf, tiny hypodensity in right lobe of liver  . Pulmonary nodules   . Diabetes mellitus without complication   . Hx of radiation therapy 04/11/12- 05/08/12    left chest wall area, 40 gray 20 fx  . Hypertension   . Diverticulosis   . Adenomatous colon polyp   . Cancer     non-hodgkins b cell lymphoma  . 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 Depression   . Pyelonephritis   . Kidney stone 02/21/1981    Right/removed  . GERD (gastroesophageal reflux disease)   . Osteoarthritis     hands,  degenerative back , osteoporosis   . Cold virus     seen by Dr. Deborra Medina on 10/12, given Rx for antibiotic & steroid  . CHF (congestive heart failure)     hosp. for CHF- 10/2013  . Shortness of breath     only with movemnent    Past Surgical History  Procedure Laterality Date  . Appendectomy  02/21/1961  . Exploratory laparotomy  02/21/1970    for ? adhesions  . Abdominal hysterectomy  02/21/1970    right ovary remaining  . Bone marrow biopsy  03/09/2012  . Lung core biopsy  02/06/2012  . Tonsillectomy    . Breast surgery  1970's    lump removed- benign   . Tubal ligation    . Eye surgery      bilateral cataracts removed - /W- IOL  . Cholecystectomy N/A 12/11/2013    Procedure: LAPAROSCOPIC CHOLECYSTECTOMY WITH INTRAOPERATIVE CHOLANGIOGRAM;  Surgeon: Erroll Luna, MD;  Location: [facility];  Service: General;  Laterality: N/A;   Family History  Problem Relation Age of Onset  . Parkinsonism Brother   . Stroke Father   . Cancer Sister   . Heart disease Brother   . Clotting disorder Father   . Kidney disease Mother    History  Substance Use Topics  . Smoking status: Current Every Day Smoker -- 0.50 packs/day for 30 years  Types: Cigarettes  . Smokeless tobacco: Never Used     Comment: 3/4 pack X 50 years  . Alcohol Use: No   OB History    No data available     Review of Systems  Constitutional: Positive for fatigue.  HENT: Negative for congestion.   Eyes: Negative for visual disturbance.  Respiratory: Negative for shortness of breath.   Cardiovascular: Negative for chest pain.  Gastrointestinal: Positive for nausea, vomiting, abdominal pain and diarrhea. Negative for blood in stool.  Genitourinary: Negative for dysuria.  Musculoskeletal: Positive for back pain.  Skin: Negative for rash.  Neurological: Negative for headaches.  Hematological: Does not bruise/bleed easily.  Psychiatric/Behavioral: Negative for confusion.      Allergies  Sulfonamide derivatives and  Metformin and related  Home Medications   Prior to Admission medications   Medication Sig Start Date End Date Taking? Authorizing Provider  albuterol (PROVENTIL HFA;VENTOLIN HFA) 108 (90 BASE) MCG/ACT inhaler Inhale 2 puffs into the lungs every 6 (six) hours as needed for wheezing or shortness of breath.   Yes Historical Provider, MD  ALPRAZolam Duanne Moron) 0.5 MG tablet Take 1 tablet (0.5 mg total) by mouth 3 (three) times daily as needed for anxiety. 05/27/14  Yes Lucille Passy, MD  aspirin EC 81 MG tablet Take 81 mg by mouth every morning.   Yes Historical Provider, MD  citalopram (CELEXA) 40 MG tablet Take 1 tablet (40 mg total) by mouth daily. 02/27/14  Yes Lucille Passy, MD  Liraglutide 18 MG/3ML SOPN Inject 0.6 mg daily x 1 week, then increase to 1.2 mg daily. Patient taking differently: Inject 0.6 mg into the skin 2 (two) times daily.  06/17/14  Yes Philemon Kingdom, MD  losartan (COZAAR) 50 MG tablet Take 1 tablet (50 mg total) by mouth every morning. 12/26/13  Yes Lucille Passy, MD  metoprolol succinate (TOPROL-XL) 100 MG 24 hr tablet Take 1 tablet (100 mg total) by mouth every morning. 12/26/13  Yes Lucille Passy, MD  vitamin B-12 (CYANOCOBALAMIN) 500 MCG tablet Take 500 mcg by mouth daily.   Yes Historical Provider, MD  Blood Glucose Monitoring Suppl ([place]) W/DEVICE KIT Use as directed to test blood sugar three times daily Dx:R73.09 05/01/14   Lucille Passy, MD  furosemide (LASIX) 40 MG tablet Take 1 tablet (40 mg total) by mouth daily as needed (swelling, shortness of breath). Patient not taking: Reported on 06/10/2014 11/02/13   Kathie Dike, MD  glipiZIDE (GLIPIZIDE XL) 5 MG 24 hr tablet Take 1 tablet (5 mg total) by mouth daily with breakfast. Patient not taking: Reported on 06/20/2014 06/10/14 06/10/15  Philemon Kingdom, MD  glucose blood test strip Use as instructed 05/01/14   Lucille Passy, MD  Insulin Pen Needle 32G X 4 MM MISC Use to inject once daily. 06/17/14   Philemon Kingdom, MD  ONE TOUCH LANCETS MISC Use as directed to test blood sugar three times daily Dx:R73.09 05/01/14   Lucille Passy, MD  pantoprazole (PROTONIX) 40 MG tablet Take 1 tablet (40 mg total) by mouth 2 (two) times daily. Patient not taking: Reported on 06/10/2014 04/02/14   Lucille Passy, MD  simvastatin (ZOCOR) 10 MG tablet Take 1 tablet (10 mg total) by mouth at bedtime. Patient not taking: Reported on 06/20/2014 04/02/14   Lucille Passy, MD   BP 132/67 mmHg  Pulse 88  Temp(Src) 97.5 F (36.4 C) (Oral)  Resp 16  Ht 5' 1"  (1.549 m)  Wt 120 lb (54.432 kg)  BMI 22.69 kg/m2  SpO2 97% Physical Exam  Constitutional: She is oriented to person, place, and time. She appears well-developed and well-nourished. No distress.  HENT:  Head: Normocephalic and atraumatic.  Mouth/Throat: Oropharynx is clear and moist.  Eyes: Conjunctivae and EOM are normal. Pupils are equal, round, and reactive to light.  Neck: Normal range of motion.  Cardiovascular: Normal rate, regular rhythm and normal heart sounds.   No murmur heard. Abdominal: Soft. Bowel sounds are normal. There is tenderness.  A diffuse mild tenderness predominantly in the epigastric area.  Musculoskeletal: Normal range of motion. She exhibits no edema.  Neurological: She is alert and oriented to person, place, and time. No cranial nerve deficit. She exhibits normal muscle tone. Coordination normal.  Skin: Skin is warm. No rash noted.  Nursing note and vitals reviewed.   ED Course  Procedures (including critical care time) Labs Review Labs Reviewed  COMPREHENSIVE METABOLIC PANEL - Abnormal; Notable for the following:    Glucose, Bld 206 (*)    GFR calc non Af Amer 64 (*)    GFR calc Af Amer 75 (*)    All other components within normal limits  CLOSTRIDIUM DIFFICILE BY PCR  CBC  LIPASE, BLOOD  URINALYSIS, ROUTINE W REFLEX MICROSCOPIC   Results for orders placed or performed during the hospital encounter of 06/20/14  CBC  Result  Value Ref Range   WBC 10.4 4.0 - 10.5 K/uL   RBC 4.73 3.87 - 5.11 MIL/uL   Hemoglobin 14.8 12.0 - 15.0 g/dL   HCT 43.3 36.0 - 46.0 %   MCV 91.5 78.0 - 100.0 fL   MCH 31.3 26.0 - 34.0 pg   MCHC 34.2 30.0 - 36.0 g/dL   RDW 13.2 11.5 - 15.5 %   Platelets 260 150 - 400 K/uL  Comprehensive metabolic panel  Result Value Ref Range   Sodium 138 135 - 145 mmol/L   Potassium 3.9 3.5 - 5.1 mmol/L   Chloride 103 96 - 112 mmol/L   CO2 25 19 - 32 mmol/L   Glucose, Bld 206 (H) 70 - 99 mg/dL   BUN 14 6 - 23 mg/dL   Creatinine, Ser 0.85 0.50 - 1.10 mg/dL   Calcium 9.5 8.4 - 10.5 mg/dL   Total Protein 6.8 6.0 - 8.3 g/dL   Albumin 4.1 3.5 - 5.2 g/dL   AST 18 0 - 37 U/L   ALT 11 0 - 35 U/L   Alkaline Phosphatase 81 39 - 117 U/L   Total Bilirubin 0.8 0.3 - 1.2 mg/dL   GFR calc non Af Amer 64 (L) >90 mL/min   GFR calc Af Amer 75 (L) >90 mL/min   Anion gap 10 5 - 15  Lipase, blood  Result Value Ref Range   Lipase 26 11 - 59 U/L    Imaging Review Ct Abdomen Pelvis W Contrast  06/20/2014   CLINICAL DATA:  Nausea, vomiting, diarrhea. Sweats since 6 a.m. today. History of kidney stones, COPD, non-Hodgkin's B-cell lymphoma, CHF, lung cancer.  EXAM: CT ABDOMEN AND PELVIS WITH CONTRAST  TECHNIQUE: Multidetector CT imaging of the abdomen and pelvis was performed using the standard protocol following bolus administration of intravenous contrast.  CONTRAST:  162m OMNIPAQUE IOHEXOL 300 MG/ML  SOLN  COMPARISON:  10/27/2013  FINDINGS: Lower chest: Lung bases are unremarkable.  Heart size is normal.  Upper abdomen: No focal abnormality identified within the liver, spleen, pancreas, or adrenal glands. Gallbladder is surgically absent.  Gastrointestinal tract: Stomach and small bowel loops are normal in appearance. The appendix is surgically absent. Colonic loops are fluid-filled, consistent with the history of diarrhea. Additionally, there appears to be mucosal enhancement of the transverse, descending, and sigmoid  colon raising the question of colitis. No focal colonic lesions are identified.  Pelvis: Status post hysterectomy.  No adnexal mass identified.  Retroperitoneum: No retroperitoneal or mesenteric adenopathy. The aorta is densely calcified. No abdominal aortic aneurysm.  Abdominal wall: Unremarkable.  Osseous structures: No suspicious lytic or blastic lesions are identified. There is a superior endplate fracture of R83, consistent osteoporotic type fracture.  IMPRESSION: 1. Fluid-filled colonic loops and enhancement of the distal colonic wall, raising the question of colitis. No focal colonic lesions identified. 2. Densely calcified abdominal aorta without aneurysm. 3. Osteoporotic type superior endplate fracture of K18. 4. Status post hysterectomy and appendectomy.   Electronically Signed   By: Nolon Nations M.D.   On: 06/20/2014 17:09     EKG Interpretation None      MDM   Final diagnoses:  Abdominal pain  Gastroenteritis    Patient symptoms probably consistent with a gastroenteritis. CT scan with evidence of some mild colitis but no evidence of any obstruction. No other complicating factors. Patient still with persistent vomiting and diarrhea nonbloody despite the antinausea medicine. C. difficile protocol started. Patient will require admission. We'll discuss with the hospitalist.    Fredia Sorrow, MD 06/20/14 [phone]

## 2014-06-21 ENCOUNTER — Encounter (HOSPITAL_COMMUNITY): Payer: Self-pay | Admitting: Internal Medicine

## 2014-06-21 DIAGNOSIS — A0472 Enterocolitis due to Clostridium difficile, not specified as recurrent: Secondary | ICD-10-CM

## 2014-06-21 DIAGNOSIS — F419 Anxiety disorder, unspecified: Secondary | ICD-10-CM | POA: Diagnosis present

## 2014-06-21 DIAGNOSIS — I519 Heart disease, unspecified: Secondary | ICD-10-CM

## 2014-06-21 DIAGNOSIS — A047 Enterocolitis due to Clostridium difficile: Principal | ICD-10-CM

## 2014-06-21 HISTORY — DX: Enterocolitis due to Clostridium difficile, not specified as recurrent: A04.72

## 2014-06-21 LAB — CBC
HEMATOCRIT: 37.1 % (ref 36.0–46.0)
Hemoglobin: 12.4 g/dL (ref 12.0–15.0)
MCH: 30.8 pg (ref 26.0–34.0)
MCHC: 33.4 g/dL (ref 30.0–36.0)
MCV: 92.1 fL (ref 78.0–100.0)
Platelets: 255 10*3/uL (ref 150–400)
RBC: 4.03 MIL/uL (ref 3.87–5.11)
RDW: 13.4 % (ref 11.5–15.5)
WBC: 7.6 10*3/uL (ref 4.0–10.5)

## 2014-06-21 LAB — GLUCOSE, CAPILLARY
Glucose-Capillary: 130 mg/dL — ABNORMAL HIGH (ref 70–99)
Glucose-Capillary: 89 mg/dL (ref 70–99)
Glucose-Capillary: 89 mg/dL (ref 70–99)
Glucose-Capillary: 150 mg/dL — ABNORMAL HIGH (ref 70–99)

## 2014-06-21 LAB — COMPREHENSIVE METABOLIC PANEL
ALT: 9 U/L (ref 0–35)
AST: 13 U/L (ref 0–37)
Albumin: 3.3 g/dL — ABNORMAL LOW (ref 3.5–5.2)
Alkaline Phosphatase: 63 U/L (ref 39–117)
Anion gap: 5 (ref 5–15)
BUN: 11 mg/dL (ref 6–23)
CALCIUM: 8.3 mg/dL — AB (ref 8.4–10.5)
CO2: 25 mmol/L (ref 19–32)
CREATININE: 0.79 mg/dL (ref 0.50–1.10)
Chloride: 110 mmol/L (ref 96–112)
GFR calc non Af Amer: 78 mL/min — ABNORMAL LOW (ref >90)
GLUCOSE: 115 mg/dL — AB (ref 70–99)
Potassium: 3.6 mmol/L (ref 3.5–5.1)
Sodium: 140 mmol/L (ref 135–145)
TOTAL PROTEIN: 5.5 g/dL — AB (ref 6.0–8.3)
Total Bilirubin: 0.8 mg/dL (ref 0.3–1.2)

## 2014-06-21 LAB — CLOSTRIDIUM DIFFICILE BY PCR: Toxigenic C. Difficile by PCR: POSITIVE — AB

## 2014-06-21 MED ORDER — METOPROLOL SUCCINATE ER 50 MG PO TB24
50.0000 mg | ORAL_TABLET | Freq: Once | ORAL | Status: DC
  Filled 2014-06-21: qty 1

## 2014-06-21 MED ORDER — METOPROLOL SUCCINATE ER 50 MG PO TB24
50.0000 mg | ORAL_TABLET | Freq: Every morning | ORAL | Status: DC
Start: 2014-06-22 — End: 2014-06-24
  Administered 2014-06-22 – 2014-06-24 (×3): 50 mg via ORAL
  Filled 2014-06-21 (×4): qty 1

## 2014-06-21 MED ORDER — LOSARTAN POTASSIUM 50 MG PO TABS
25.0000 mg | ORAL_TABLET | Freq: Once | ORAL | Status: DC
  Filled 2014-06-21: qty 1

## 2014-06-21 MED ORDER — MORPHINE SULFATE 2 MG/ML IJ SOLN
2.0000 mg | INTRAMUSCULAR | Status: DC | PRN
  Administered 2014-06-21 – 2014-06-24 (×10): 2 mg via INTRAVENOUS
  Filled 2014-06-21 (×11): qty 1

## 2014-06-21 MED ORDER — MORPHINE SULFATE 2 MG/ML IJ SOLN
2.0000 mg | Freq: Once | INTRAMUSCULAR | Status: AC
  Administered 2014-06-21: 2 mg via INTRAVENOUS

## 2014-06-21 MED ORDER — FAMOTIDINE 20 MG PO TABS
20.0000 mg | ORAL_TABLET | Freq: Every day | ORAL | Status: DC
  Administered 2014-06-21 – 2014-06-22 (×2): 20 mg via ORAL
  Filled 2014-06-21 (×2): qty 1

## 2014-06-21 MED ORDER — METRONIDAZOLE 500 MG PO TABS: 500.0000 mg | ORAL_TABLET | Freq: Three times a day (TID) | ORAL | Status: DC

## 2014-06-21 MED ORDER — METRONIDAZOLE IN NACL 5-0.79 MG/ML-% IV SOLN
500.0000 mg | Freq: Three times a day (TID) | INTRAVENOUS | Status: DC
  Administered 2014-06-21 – 2014-06-23 (×7): 500 mg via INTRAVENOUS
  Filled 2014-06-21 (×7): qty 100

## 2014-06-21 MED ORDER — LOSARTAN POTASSIUM 50 MG PO TABS
25.0000 mg | ORAL_TABLET | Freq: Every morning | ORAL | Status: DC
  Administered 2014-06-22 – 2014-06-24 (×3): 25 mg via ORAL
  Filled 2014-06-21 (×4): qty 1

## 2014-06-21 NOTE — Progress Notes (Signed)
Utilization review Completed Kirin Brandenburger RN BSN   

## 2014-06-21 NOTE — Progress Notes (Signed)
Pt c/o pain in stomach rated as 10/10. Pain med not due at this time. Paged Dr Donnal Debar. Waiting to hear back from dr. Continuing to monitor.

## 2014-06-21 NOTE — Progress Notes (Signed)
[facility] PROGRESS NOTE  Marie Holmes IWL:[id number] DOB: 11-29-1935 DOA: 06/20/2014 PCP: Arnette Norris, MD    Code Status: Full code Family Communication: Discussed with patient; family not available. Disposition Plan: Discharge when clinically appropriate.   Consultants:  None  Procedures:  None  Antibiotics:  Metronidazole 4/30>>  HPI/Subjective: Patient says that she feels better, but she continues to have loose stools. She has no abdominal pain, nausea, or vomiting. She said that she was a little anxious overnight. The Xanax helped.  Objective: Filed Vitals:   06/21/14 0658  BP: 110/42  Pulse: 80  Temp: 98.4 F (36.9 C)  Resp: 18   oxygen saturation 92% on room air.  Intake/Output Summary (Last 24 hours) at 06/21/14 1056 Last data filed at 06/21/14 0845  Gross per 24 hour  Intake 2182.92 ml  Output      0 ml  Net 2182.92 ml   Filed Weights   06/20/14 1244 06/20/14 2036  Weight: 54.432 kg (120 lb) 54.432 kg (120 lb)    Exam:   General:  Alert 79 year old woman laying in bed, in no acute distress.  Cardiovascular: S1, S2, with a soft systolic murmur.  Respiratory: Clear to auscultation bilaterally.  Abdomen: Positive bowel sounds, soft, nontender, nondistended.  Musculoskeletal/extremities: No acute hot red joints. No pedal edema.   Data Reviewed: Basic Metabolic Panel:  Recent Labs Lab 06/20/14 1325 06/21/14 0602  NA 138 140  K 3.9 3.6  CL 103 110  CO2 25 25  GLUCOSE 206* 115*  BUN 14 11  CREATININE 0.85 0.79  CALCIUM 9.5 8.3*   Liver Function Tests:  Recent Labs Lab 06/20/14 1325 06/21/14 0602  AST 18 13  ALT 11 9  ALKPHOS 81 63  BILITOT 0.8 0.8  PROT 6.8 5.5*  ALBUMIN 4.1 3.3*    Recent Labs Lab 06/20/14 1325  LIPASE 26   No results for input(s): AMMONIA in the last 168 hours. CBC:  Recent Labs Lab 06/20/14 1325 06/21/14 0602  WBC 10.4 7.6  HGB 14.8 12.4  HCT 43.3 37.1  MCV 91.5 92.1  PLT 260 255    Cardiac Enzymes: No results for input(s): CKTOTAL, CKMB, CKMBINDEX, TROPONINI in the last 168 hours. BNP (last 3 results) No results for input(s): BNP in the last 8760 hours.  ProBNP (last 3 results)  Recent Labs  10/30/13 0549  PROBNP 3093.0*    CBG:  Recent Labs Lab 06/20/14 2302 06/21/14 0746  GLUCAP 137* 130*    Recent Results (from the past 240 hour(s))  Clostridium Difficile by PCR     Status: Abnormal   Collection Time: 06/20/14  6:25 PM  Result Value Ref Range Status   C difficile by pcr POSITIVE (A) NEGATIVE Final    Comment: CRITICAL RESULT CALLED TO, READ BACK BY AND VERIFIED WITH: NORMAN,B ON 06/20/14 AT 1950 BY LOY,C      Studies: Ct Abdomen Pelvis W Contrast  06/20/2014   CLINICAL DATA:  Nausea, vomiting, diarrhea. Sweats since 6 a.m. today. History of kidney stones, COPD, non-Hodgkin's B-cell lymphoma, CHF, lung cancer.  EXAM: CT ABDOMEN AND PELVIS WITH CONTRAST  TECHNIQUE: Multidetector CT imaging of the abdomen and pelvis was performed using the standard protocol following bolus administration of intravenous contrast.  CONTRAST:  152m OMNIPAQUE IOHEXOL 300 MG/ML  SOLN  COMPARISON:  10/27/2013  FINDINGS: Lower chest: Lung bases are unremarkable.  Heart size is normal.  Upper abdomen: No focal abnormality identified within the liver, spleen, pancreas, or adrenal glands. Gallbladder  is surgically absent.  Gastrointestinal tract: Stomach and small bowel loops are normal in appearance. The appendix is surgically absent. Colonic loops are fluid-filled, consistent with the history of diarrhea. Additionally, there appears to be mucosal enhancement of the transverse, descending, and sigmoid colon raising the question of colitis. No focal colonic lesions are identified.  Pelvis: Status post hysterectomy.  No adnexal mass identified.  Retroperitoneum: No retroperitoneal or mesenteric adenopathy. The aorta is densely calcified. No abdominal aortic aneurysm.  Abdominal wall:  Unremarkable.  Osseous structures: No suspicious lytic or blastic lesions are identified. There is a superior endplate fracture of G86, consistent osteoporotic type fracture.  IMPRESSION: 1. Fluid-filled colonic loops and enhancement of the distal colonic wall, raising the question of colitis. No focal colonic lesions identified. 2. Densely calcified abdominal aorta without aneurysm. 3. Osteoporotic type superior endplate fracture of P61. 4. Status post hysterectomy and appendectomy.   Electronically Signed   By: Nolon Nations M.D.   On: 06/20/2014 17:09    Scheduled Meds: . aspirin EC  81 mg Oral q morning - 10a  . citalopram  40 mg Oral Daily  . heparin  5,000 Units Subcutaneous 3 times per day  . insulin aspart  0-15 Units Subcutaneous TID WC  . insulin aspart  0-5 Units Subcutaneous QHS  . losartan  50 mg Oral q morning - 10a  . metoprolol succinate  100 mg Oral q morning - 10a  . metronidazole  500 mg Intravenous Q8H  . vitamin B-12  500 mcg Oral Daily   Continuous Infusions: . 0.9 % NaCl with KCl 20 mEq / L 100 mL/hr at 06/21/14 0815   Assessment and plan: Principal Problem:   C. difficile colitis Active Problems:   Diabetes mellitus type II, controlled   Essential hypertension   GASTROESOPHAGEAL REFLUX, NO ESOPHAGITIS   Diastolic dysfunction   Chronic anxiety   1. C. difficile colitis. On admission, the CT scan of the patient's abdomen and pelvis revealed fluid filled colonic loops and enhancement of the distal colonic wall raising the question of colitis. C. difficile PCR was ordered and became positive. Flagyl was started IV; will change Flagyl to by mouth in the next 24-48 hours. We'll continue supportive treatment and gentle IV fluid hydration. Clear liquid diet was started; will advance diet as tolerated.  Type 2 diabetes mellitus. The patient is treated chronically with liraglutide and glipizide. These medications were discontinued in light of her marginal oral  intake. Sliding scale NovoLog was started and will be continued.  Essential hypertension. The patient is treated chronically with metoprolol and losartan. Because her blood pressures have been low-normal, will decrease the dose of metoprolol and losartan.  Chronic diastolic dysfunction/heart failure. Lasix is being withheld while she is gently hydrated.  This appears to be compensated for now. We'll continue to monitor closely.  GERD. The patient is treated chronically with Protonix. This is being held in the setting of C. difficile colitis. Will add H2 blocker instead, to avoid rebound gastritis.   Time spent:   35 minutes  [facility] Pager [phone]. If 7PM-7AM, please contact night-coverage at [email or web address], password [facility] 06/21/2014, 10:56 AM  LOS: 1 day

## 2014-06-22 ENCOUNTER — Inpatient Hospital Stay (HOSPITAL_COMMUNITY): Payer: PPO

## 2014-06-22 DIAGNOSIS — K219 Gastro-esophageal reflux disease without esophagitis: Secondary | ICD-10-CM

## 2014-06-22 LAB — URINALYSIS, ROUTINE W REFLEX MICROSCOPIC
BILIRUBIN URINE: NEGATIVE
Glucose, UA: NEGATIVE mg/dL
HGB URINE DIPSTICK: NEGATIVE
Ketones, ur: NEGATIVE mg/dL
NITRITE: NEGATIVE
Protein, ur: NEGATIVE mg/dL
Specific Gravity, Urine: 1.01 (ref 1.005–1.030)
UROBILINOGEN UA: 0.2 mg/dL (ref 0.0–1.0)
pH: 5.5 (ref 5.0–8.0)

## 2014-06-22 LAB — GLUCOSE, CAPILLARY
GLUCOSE-CAPILLARY: 108 mg/dL — AB (ref 70–99)
Glucose-Capillary: 127 mg/dL — ABNORMAL HIGH (ref 70–99)
Glucose-Capillary: 97 mg/dL (ref 70–99)
Glucose-Capillary: 164 mg/dL — ABNORMAL HIGH (ref 70–99)

## 2014-06-22 LAB — LIPASE, BLOOD: LIPASE: 17 U/L — AB (ref 22–51)

## 2014-06-22 LAB — BASIC METABOLIC PANEL
ANION GAP: 3 — AB (ref 5–15)
BUN: 7 mg/dL (ref 6–20)
CALCIUM: 7.9 mg/dL — AB (ref 8.9–10.3)
CHLORIDE: 113 mmol/L — AB (ref 101–111)
CO2: 23 mmol/L (ref 22–32)
Creatinine, Ser: 0.76 mg/dL (ref 0.44–1.00)
GFR calc Af Amer: >60 mL/min (ref >60)
GFR calc non Af Amer: >60 mL/min (ref >60)
GLUCOSE: 116 mg/dL — AB (ref 70–99)
POTASSIUM: 3.9 mmol/L (ref 3.5–5.1)
SODIUM: 139 mmol/L (ref 135–145)

## 2014-06-22 LAB — URINE MICROSCOPIC-ADD ON

## 2014-06-22 MED ORDER — SIMETHICONE 40 MG/0.6ML PO SUSP
40.0000 mg | Freq: Once | ORAL | Status: AC
  Administered 2014-06-22: 40 mg via ORAL

## 2014-06-22 MED ORDER — SIMETHICONE 40 MG/0.6ML PO SUSP: 40.0000 mg | Freq: Once | ORAL | Status: DC

## 2014-06-22 MED ORDER — FAMOTIDINE 20 MG PO TABS
20.0000 mg | ORAL_TABLET | Freq: Two times a day (BID) | ORAL | Status: DC
  Administered 2014-06-22 – 2014-06-24 (×4): 20 mg via ORAL
  Filled 2014-06-22 (×4): qty 1

## 2014-06-22 NOTE — Progress Notes (Signed)
[facility] PROGRESS NOTE  Marie Holmes OYD:[id number] DOB: [birth year] DOA: 06/20/2014 PCP: Arnette Norris, MD    Code Status: Full code Family Communication: Discussed with patient; family not available. Disposition Plan: Discharge when clinically appropriate.   Consultants:  None  Procedures:  None  Antibiotics:  Metronidazole 4/30>>  HPI/Subjective: Patient says that she had abdominal pain associated with abdominal distention as if she feels that she is full of gas. She has mild nausea but no vomiting. She continues to have loose stools. She does not want the full liquids stopped.  Objective: Filed Vitals:   06/22/14 1318  BP: 101/53  Pulse: 73  Temp: 97.9 F (36.6 C)  Resp: 18   oxygen saturation 95% on room air.  Intake/Output Summary (Last 24 hours) at 06/22/14 1743 Last data filed at 06/22/14 1617  Gross per 24 hour  Intake 2799.83 ml  Output    550 ml  Net 2249.83 ml   Filed Weights   06/20/14 1244 06/20/14 2036  Weight: 54.432 kg (120 lb) 54.432 kg (120 lb)    Exam:   General:  Alert 79 year old woman laying in bed, in no acute distress.  Cardiovascular: S1, S2, with a soft systolic murmur.  Respiratory: Clear to auscultation bilaterally.  Abdomen: Positive bowel sounds, soft, mildly diffusely distended; no rebound or guarding or rigidity.  Musculoskeletal/extremities: No acute hot red joints. No pedal edema.   Data Reviewed: Basic Metabolic Panel:  Recent Labs Lab 06/20/14 1325 06/21/14 0602 06/22/14 0546  NA 138 140 139  K 3.9 3.6 3.9  CL 103 110 113*  CO2 '25 25 23  '$ GLUCOSE 206* 115* 116*  BUN '14 11 7  '$ CREATININE 0.85 0.79 0.76  CALCIUM 9.5 8.3* 7.9*   Liver Function Tests:  Recent Labs Lab 06/20/14 1325 06/21/14 0602  AST 18 13  ALT 11 9  ALKPHOS 81 63  BILITOT 0.8 0.8  PROT 6.8 5.5*  ALBUMIN 4.1 3.3*    Recent Labs Lab 06/20/14 1325 06/22/14 0546  LIPASE 26 17*   No results for input(s): AMMONIA in the  last 168 hours. CBC:  Recent Labs Lab 06/20/14 1325 06/21/14 0602  WBC 10.4 7.6  HGB 14.8 12.4  HCT 43.3 37.1  MCV 91.5 92.1  PLT 260 255   Cardiac Enzymes: No results for input(s): CKTOTAL, CKMB, CKMBINDEX, TROPONINI in the last 168 hours. BNP (last 3 results) No results for input(s): BNP in the last 8760 hours.  ProBNP (last 3 results)  Recent Labs  10/30/13 0549  PROBNP 3093.0*    CBG:  Recent Labs Lab 06/21/14 1703 06/21/14 2155 06/22/14 0805 06/22/14 1147 06/22/14 1702  GLUCAP 89 150* 127* 97 108*    Recent Results (from the past 240 hour(s))  Clostridium Difficile by PCR     Status: Abnormal   Collection Time: 06/20/14  6:25 PM  Result Value Ref Range Status   C difficile by pcr POSITIVE (A) NEGATIVE Final    Comment: CRITICAL RESULT CALLED TO, READ BACK BY AND VERIFIED WITH: NORMAN,B ON 06/20/14 AT 1950 BY LOY,C   Clostridium Difficile by PCR     Status: Abnormal   Collection Time: 06/21/14  9:07 AM  Result Value Ref Range Status   C difficile by pcr POSITIVE (A) NEGATIVE Final    Comment: CRITICAL RESULT CALLED TO, READ BACK BY AND VERIFIED WITH: MORRIS C. AT [id number] ON [id number] BY THOMPSON S.      Studies: Dg Abd 1 View  06/22/2014   CLINICAL  DATA:  Abdominal distention.  C difficile colitis.  EXAM: ABDOMEN - 1 VIEW  COMPARISON:  None.  FINDINGS: No evidence of dilated bowel loops. Residual oral contrast material is seen throughout the colon. Extensive aortoiliac atherosclerotic calcification noted.  IMPRESSION: Normal bowel gas pattern. Residual oral contrast noted throughout the colon.   Electronically Signed   By: Earle Gell M.D.   On: 06/22/2014 14:09    Scheduled Meds: . aspirin EC  81 mg Oral q morning - 10a  . citalopram  40 mg Oral Daily  . famotidine  20 mg Oral Daily  . heparin  5,000 Units Subcutaneous 3 times per day  . insulin aspart  0-15 Units Subcutaneous TID WC  . insulin aspart  0-5 Units Subcutaneous QHS  . losartan  25 mg Oral  q morning - 10a  . losartan  25 mg Oral Once  . metoprolol succinate  50 mg Oral q morning - 10a  . metoprolol succinate  50 mg Oral Once  . metronidazole  500 mg Intravenous Q8H  . vitamin B-12  500 mcg Oral Daily   Continuous Infusions: . 0.9 % NaCl with KCl 20 mEq / L 70 mL/hr at 06/22/14 0104   Assessment and plan: Principal Problem:   C. difficile colitis Active Problems:   Diabetes mellitus type II, controlled   Essential hypertension   GASTROESOPHAGEAL REFLUX, NO ESOPHAGITIS   Diastolic dysfunction   Chronic anxiety   1. C. difficile colitis. On admission, the CT scan of the patient's abdomen and pelvis revealed fluid filled colonic loops and enhancement of the distal colonic wall raising the question of colitis. C. difficile PCR was ordered and was positive. Flagyl was started IV; will change Flagyl to by mouth when the patient has fewer GI symptoms. She was noted to have subjective abdominal distention and increase in colonic gas. Simethicone was ordered. Abdominal x-ray revealed a normal gas pattern with residual oral contrast noted throughout the colon-? Etiology of her symptoms. We'll continue supportive treatment and gentle IV fluid hydration.  Continue full liquids for now.  Type 2 diabetes mellitus. The patient is treated chronically with liraglutide and glipizide. These medications were discontinued in light of her marginal oral intake. Sliding scale NovoLog was started and will be continued. Her CBGs are on the lower end of normal.  Essential hypertension. The patient is treated chronically with metoprolol and losartan. Because her blood pressures have been low-normal, the dose of metoprolol and losartan were decreased.  Chronic diastolic dysfunction/heart failure. Lasix is being withheld while she is being gently hydrated.  This appears to be compensated for now. We'll continue to monitor closely.  GERD. The patient is treated chronically with  Protonix. This is being held in the setting of C. difficile colitis.  H2 blocker added instead, to avoid rebound gastritis.   Time spent:   30 minutes  [facility] Pager [phone]. If 7PM-7AM, please contact night-coverage at [email or web address], password [facility] 06/22/2014, 5:43 PM  LOS: 2 days

## 2014-06-22 NOTE — Progress Notes (Signed)
Requested xanax for sleep. 0.'5mg'$  given

## 2014-06-23 LAB — GLUCOSE, CAPILLARY
GLUCOSE-CAPILLARY: 73 mg/dL (ref 70–99)
Glucose-Capillary: 143 mg/dL — ABNORMAL HIGH (ref 70–99)
Glucose-Capillary: 60 mg/dL — ABNORMAL LOW (ref 70–99)
Glucose-Capillary: 111 mg/dL — ABNORMAL HIGH (ref 70–99)
Glucose-Capillary: 152 mg/dL — ABNORMAL HIGH (ref 70–99)
Glucose-Capillary: 133 mg/dL — ABNORMAL HIGH (ref 70–99)

## 2014-06-23 MED ORDER — METRONIDAZOLE 500 MG PO TABS
500.0000 mg | ORAL_TABLET | Freq: Three times a day (TID) | ORAL | Status: DC
  Administered 2014-06-23 – 2014-06-24 (×5): 500 mg via ORAL
  Filled 2014-06-23 (×5): qty 1

## 2014-06-23 MED ORDER — INSULIN ASPART 100 UNIT/ML ~~LOC~~ SOLN: 0.0000 [IU] | Freq: Every day | SUBCUTANEOUS | Status: DC

## 2014-06-23 MED ORDER — GLUCOSE 40 % PO GEL
ORAL | Status: AC
  Administered 2014-06-23: 37.5 g
  Filled 2014-06-23: qty 1

## 2014-06-23 MED ORDER — INSULIN ASPART 100 UNIT/ML ~~LOC~~ SOLN
0.0000 [IU] | Freq: Three times a day (TID) | SUBCUTANEOUS | Status: DC
  Administered 2014-06-23: 2 [IU] via SUBCUTANEOUS
  Administered 2014-06-24: 1 [IU] via SUBCUTANEOUS
  Administered 2014-06-24: 2 [IU] via SUBCUTANEOUS
  Administered 2014-06-24: 1 [IU] via SUBCUTANEOUS

## 2014-06-23 NOTE — Care Management Note (Addendum)
    Page 1 of 1   06/24/2014     4:04:04 PM CARE MANAGEMENT NOTE 06/24/2014  Patient:  Marie Holmes, Marie Holmes   Account Number:  [id number]  Date Initiated:  06/23/2014  Documentation initiated by:  Theophilus Kinds  Subjective/Objective Assessment:   Pt admitted from home with c diff. Pt lives alone and has a daughter who is very active in the care of the pt. Pt stated she will return home at discharge. Pt is independent with ADL.     Action/Plan:   No CM needs  noted. Anticipate discharge within 24 hours.   Anticipated DC Date:  06/24/2014   Anticipated DC Plan:  [place]  CM consult      Choice offered to / List presented to:             Status of service:  Completed, signed off Medicare Important Message given?  YES (If response is "NO", the following Medicare IM given date fields will be blank) Date Medicare IM given:  06/24/2014 Medicare IM given by:  Theophilus Kinds Date Additional Medicare IM given:   Additional Medicare IM given by:    Discharge Disposition:  HOME/SELF CARE  Per UR Regulation:    If discussed at Long Length of Stay Meetings, dates discussed:    Comments:  06/24/14 [place], RN BSN CM Pt discharged home today. No CM needs noted.  06/23/14 [place], RN BSN CM

## 2014-06-23 NOTE — Progress Notes (Signed)
Inpatient Diabetes Program Recommendations  AACE/ADA: New Consensus Statement on Inpatient Glycemic Control (2013)  Target Ranges:  Prepandial:   less than 140 mg/dL      Peak postprandial:   less than 180 mg/dL (1-2 hours)      Critically ill patients:  140 - 180 mg/dL   Results for Marie Holmes, Marie Holmes (MRN [id number]) as of 06/23/2014 13:01  Ref. Range 06/22/2014 08:05 06/22/2014 11:47 06/22/2014 17:02 06/22/2014 20:18 06/23/2014 09:13 06/23/2014 12:12  Glucose-Capillary Latest Ref Range: 70-99 mg/dL 127 (H) 97 108 (H) 164 (H) 143 (H) 60 (L)   Diabetes history: DM2 Outpatient Diabetes medications: Victoza 0.6 mg BID, Glipizide 5 mg QAM (home med list states pt not taking Glipizide) Current orders for Inpatient glycemic control: Novolog 0-15 units TID [facility]  Inpatient Diabetes Program Recommendations Correction (SSI): May want to consider decreasing Novolog correction to sensitive scale.  Note: In reviewing the chart noted patient saw Dr. Cruzita Lederer (Endocrinologist) on 06/10/14 and pt was advised to "Please decrease the Glipizide XL to 5 mg daily in am, before breakfast. Please start Metformin XR 500 mg with dinner x 4 days. If you tolerate this well, add another Metformin tablet (500 mg) with breakfast x 4 days. Continue with 500 mg of metformin XR 2x a day with breakfast and dinner."  Dr. Cruzita Lederer noted that if patient could not tolerate Metformin she would place patient on Victoza (noted Victoza works for patient but expensive and put patient in the doughnut hole quickly). Noted patient called Dr. Arman Filter office back due to diarrhea with the Metformin. Noted telephone note by Dr. Cruzita Lederer on 06/17/14  "OK to stop Metformin and start Victoza 0.6 mg daily x 1 week, then increase to 1.2 mg daily." Currently patient is only ordered Novolog moderate correction scale and CBGs have ranged from 60-164 mg/dl over the past 24 hours. CBG dropped to 60 mg/dl today at 12:12 after receiving Novolog 2 units at 9:30 for CBG of 143  mg/dl at 9:13.  Please consider decreasing Novolog correction to sensitive scale.   Thanks, Barnie Alderman, RN, MSN, CCRN, CDE Diabetes Coordinator Inpatient Diabetes Program [phone] (Team Pager from [place] to [place]) [phone] ([place] office) [phone] [facility] office)

## 2014-06-23 NOTE — Plan of Care (Signed)
Problem: Phase II Progression Outcomes Goal: Progress activity as tolerated unless otherwise ordered Outcome: Completed/Met Date Met:  06/23/14 Ambulating in room without difficulty.

## 2014-06-23 NOTE — Progress Notes (Signed)
CRITICAL VALUE ALERT  Critical value received:  CBG 60  Date of notification:  06/23/2014  Time of notification:  0158  Critical value read back:Yes.    Nurse who received alert:  Radene Gunning  MD notified (1st page):  Dr Caryn Section  Time of first page:  1228  MD notified (2nd page):  Time of second page:  Responding MD:  Dr Caryn Section  Time MD responded:  1229

## 2014-06-23 NOTE — Progress Notes (Signed)
[facility] PROGRESS NOTE  Marie Holmes [id number] DOB: Jun 26, 1935 DOA: 06/20/2014 PCP: Arnette Norris, MD    Code Status: Full code Family Communication: Discussed with patient; family not available. Disposition Plan: Discharge when clinically appropriate.   Consultants:  None  Procedures:  None  Antibiotics:  Metronidazole 4/30>>  HPI/Subjective: Patient has less abdominal pain and gaseous distention today. She has less nausea. Her stools are starting to have some solidification, but they're still loose. She had 4 bowel movements this morning. She believes that she can try a soft solid diet.  Objective: Filed Vitals:   06/23/14 0650  BP: 114/44  Pulse: 82  Temp: 98.3 F (36.8 C)  Resp: 20   oxygen saturation 92% on room air.  Intake/Output Summary (Last 24 hours) at 06/23/14 1537 Last data filed at 06/23/14 0700  Gross per 24 hour  Intake   2720 ml  Output   1600 ml  Net   1120 ml   Filed Weights   06/20/14 1244 06/20/14 2036  Weight: 54.432 kg (120 lb) 54.432 kg (120 lb)    Exam:   General:  Alert 79 year old woman laying in bed, in no acute distress.  Cardiovascular: S1, S2, with a soft systolic murmur.  Respiratory: Clear to auscultation bilaterally.  Abdomen: Positive bowel sounds, soft, mildly tender right lower quadrant; no significant distention; no rebound or guarding or rigidity.  Musculoskeletal/extremities: No acute hot red joints. No pedal edema.   Data Reviewed: Basic Metabolic Panel:  Recent Labs Lab 06/20/14 1325 06/21/14 0602 06/22/14 0546  NA 138 140 139  K 3.9 3.6 3.9  CL 103 110 113*  CO2 '25 25 23  '$ GLUCOSE 206* 115* 116*  BUN '14 11 7  '$ CREATININE 0.85 0.79 0.76  CALCIUM 9.5 8.3* 7.9*   Liver Function Tests:  Recent Labs Lab 06/20/14 1325 06/21/14 0602  AST 18 13  ALT 11 9  ALKPHOS 81 63  BILITOT 0.8 0.8  PROT 6.8 5.5*  ALBUMIN 4.1 3.3*    Recent Labs Lab 06/20/14 1325 06/22/14 0546  LIPASE 26  17*   No results for input(s): AMMONIA in the last 168 hours. CBC:  Recent Labs Lab 06/20/14 1325 06/21/14 0602  WBC 10.4 7.6  HGB 14.8 12.4  HCT 43.3 37.1  MCV 91.5 92.1  PLT 260 255   Cardiac Enzymes: No results for input(s): CKTOTAL, CKMB, CKMBINDEX, TROPONINI in the last 168 hours. BNP (last 3 results) No results for input(s): BNP in the last 8760 hours.  ProBNP (last 3 results)  Recent Labs  10/30/13 0549  PROBNP 3093.0*    CBG:  Recent Labs Lab 06/22/14 1702 06/22/14 2018 06/23/14 0913 06/23/14 1212 06/23/14 1248  GLUCAP 108* 164* 143* 60* 111*    Recent Results (from the past 240 hour(s))  Clostridium Difficile by PCR     Status: Abnormal   Collection Time: 06/20/14  6:25 PM  Result Value Ref Range Status   C difficile by pcr POSITIVE (A) NEGATIVE Final    Comment: CRITICAL RESULT CALLED TO, READ BACK BY AND VERIFIED WITH: NORMAN,B ON 06/20/14 AT 1950 BY LOY,C   Clostridium Difficile by PCR     Status: Abnormal   Collection Time: 06/21/14  9:07 AM  Result Value Ref Range Status   C difficile by pcr POSITIVE (A) NEGATIVE Final    Comment: CRITICAL RESULT CALLED TO, READ BACK BY AND VERIFIED WITH: MORRIS C. AT 1659 ON [id number] BY THOMPSON S.      Studies: Dg  Abd 1 View  06/22/2014   CLINICAL DATA:  Abdominal distention.  C difficile colitis.  EXAM: ABDOMEN - 1 VIEW  COMPARISON:  None.  FINDINGS: No evidence of dilated bowel loops. Residual oral contrast material is seen throughout the colon. Extensive aortoiliac atherosclerotic calcification noted.  IMPRESSION: Normal bowel gas pattern. Residual oral contrast noted throughout the colon.   Electronically Signed   By: Earle Gell M.D.   On: 06/22/2014 14:09    Scheduled Meds: . aspirin EC  81 mg Oral q morning - 10a  . citalopram  40 mg Oral Daily  . famotidine  20 mg Oral BID  . heparin  5,000 Units Subcutaneous 3 times per day  . insulin aspart  0-15 Units Subcutaneous TID WC  . insulin aspart  0-5  Units Subcutaneous QHS  . losartan  25 mg Oral q morning - 10a  . losartan  25 mg Oral Once  . metoprolol succinate  50 mg Oral q morning - 10a  . metoprolol succinate  50 mg Oral Once  . metronidazole  500 mg Intravenous Q8H  . vitamin B-12  500 mcg Oral Daily   Continuous Infusions: . 0.9 % NaCl with KCl 20 mEq / L 70 mL/hr at 06/22/14 1852   Assessment and plan: Principal Problem:   C. difficile colitis Active Problems:   Diabetes mellitus type II, controlled   Essential hypertension   GASTROESOPHAGEAL REFLUX, NO ESOPHAGITIS   Diastolic dysfunction   Chronic anxiety   1. C. difficile colitis. On admission, the CT scan of the patient's abdomen and pelvis revealed fluid filled colonic loops and enhancement of the distal colonic wall raising the question of colitis. C. difficile PCR was ordered and was positive. Flagyl was started IV a couple of days before it was transitioned to oral Flagyl (today). She was noted to have subjective abdominal distention and increase in colonic gas. Simethicone was ordered. Abdominal x-ray revealed a normal gas pattern with residual oral contrast noted throughout the colon. We'll continue supportive treatment and gentle IV fluid hydration.  With answer diet to soft diet from full liquids today.  Type 2 diabetes mellitus. The patient is treated chronically with liraglutide and glipizide. These medications were held in light of her marginal oral intake. Sliding scale NovoLog was started. She had an asymptomatic hypoglycemic episode, so sliding scale NovoLog was changed to sensitive scale. She was treated for hypoglycemia appropriately.  Essential hypertension. The patient is treated chronically with metoprolol and losartan. Because her blood pressures have been low-normal, the dose of metoprolol and losartan were decreased.  Chronic diastolic dysfunction/heart failure. Lasix is being withheld while she is being gently hydrated.  This appears to  be compensated for now. We'll continue to monitor closely.  GERD. The patient is treated chronically with Protonix. It is being held in the setting of C. difficile colitis.  H2 blocker added instead, to avoid rebound gastritis.   Time spent:   25 minutes  [facility] Pager [phone]. If 7PM-7AM, please contact night-coverage at [email or web address], password [facility] 06/23/2014, 3:37 PM  LOS: 3 days

## 2014-06-24 ENCOUNTER — Inpatient Hospital Stay (HOSPITAL_COMMUNITY): Payer: PPO

## 2014-06-24 LAB — GLUCOSE, CAPILLARY
GLUCOSE-CAPILLARY: 144 mg/dL — AB (ref 70–99)
Glucose-Capillary: 149 mg/dL — ABNORMAL HIGH (ref 70–99)
Glucose-Capillary: 165 mg/dL — ABNORMAL HIGH (ref 70–99)

## 2014-06-24 MED ORDER — HYDROCODONE-ACETAMINOPHEN 5-500 MG PO TABS: 1.0000 | ORAL_TABLET | Freq: Four times a day (QID) | ORAL | Status: DC | PRN

## 2014-06-24 MED ORDER — ONDANSETRON HCL 4 MG PO TABS: 4.0000 mg | ORAL_TABLET | Freq: Four times a day (QID) | ORAL | Status: DC | PRN

## 2014-06-24 MED ORDER — LOSARTAN POTASSIUM 50 MG PO TABS: ORAL_TABLET | ORAL | Status: DC

## 2014-06-24 MED ORDER — METRONIDAZOLE 500 MG PO TABS: 500.0000 mg | ORAL_TABLET | Freq: Three times a day (TID) | ORAL | Status: DC

## 2014-06-24 MED ORDER — METRONIDAZOLE 500 MG PO TABS
500.0000 mg | ORAL_TABLET | Freq: Three times a day (TID) | ORAL | Status: DC
Start: 2014-06-24 — End: 2014-06-24

## 2014-06-24 NOTE — Progress Notes (Signed)
Patient with orders to be discharge home. Discharge instructions given, patient verbalized understanding. Prescriptions given. Patient stable. Patient left with family in private vehicle.

## 2014-06-24 NOTE — Discharge Summary (Signed)
Physician Discharge Summary  Marie Holmes [id number] DOB: 1935/08/01 DOA: 06/20/2014  PCP: Arnette Norris, MD  Admit date: 06/20/2014 Discharge date: 06/24/2014  Time spent: Greater than 30 minutes  Recommendations for Outpatient Follow-up:  1.   Discharge Diagnoses:  1. C. difficile colitis. 2. Diabetes mellitus, type II, controlled. 3. Essential hypertension. 4. Chronic diastolic dysfunction. Remained stable. 5. Chronic anxiety. 6. GERD.  Discharge Condition: Improved.  Diet recommendation: Carbohydrate modified.  Filed Weights   06/20/14 1244 06/20/14 2036  Weight: 54.432 kg (120 lb) 54.432 kg (120 lb)    History of present illness:  The patient is a 79 year old woman with a history of GERD, diastolic dysfunction, diabetes mellitus, and hypertension. She presented to the emergency department on 06/20/2014 with a complaint of diarrhea and nausea/vomiting. In the ED, CT scan of her abdomen and pelvis revealed fluid filled colonic loops and enhancement of the distal colonic wall raising the question of colitis, but no focal colonic lesions identified. She was afebrile and hemodynamically stable. Her white blood cell count was within normal limits. She was admitted for further evaluation and management.  Hospital Course:  1. C. difficile colitis. On admission, the CT scan of the patient's abdomen and pelvis revealed fluid filled colonic loops and enhancement of the distal colonic wall raising the question of colitis. C. difficile PCR was ordered and was positive. Flagyl was started and given IV for a couple of days before it was transitioned to oral Flagyl. Supportive treatment was given with IV fluids for hydration, as needed IV analgesics, and as needed IV antiemetics. She was noted to have subjective abdominal distention and increase in colonic gas. Simethicone was ordered. Abdominal x-ray revealed a normal gas pattern with residual oral contrast noted throughout the colon. Her  diet was advanced from clear liquids, to full liquids, to solid foods. She tolerated the advancement well. She received a total of 3 days of Flagyl. She was discharged on 8 more days of treatment.  Type 2 diabetes mellitus. The patient is treated chronically with liraglutide. It was held in light of her marginal oral intake. Sliding scale NovoLog was started. She had an asymptomatic hypoglycemic episode, so sliding scale NovoLog was changed to sensitive scale. She was treated for hypoglycemia appropriately. At the time of discharge, her capillary blood glucose did trend upward as her appetite and oral intake improved. She was instructed to resume liraglutide at home.  Essential hypertension. The patient is treated chronically with metoprolol and losartan. Because her blood pressures had been low-normal, the dose of metoprolol and losartan were decreased. At the time of discharge, she was instructed to restart metoprolol at the prehospital dose, but to decrease losartan to 25 mg 4 days and then return to 50 mg daily as previously prescribed prior to the hospitalization.  Chronic diastolic dysfunction/heart failure. Lasix was withheld while she was being gently hydrated.  Her chest x-ray revealed no evidence of edema or consolidation.  GERD. The patient was treated with Pepcid empirically. Apparently, Protonix had been discontinued in the outpatient setting. Pepcid was discontinued upon discharge.    Procedures:  None  Consultations:  None  Discharge Exam: Filed Vitals:   06/24/14 1337  BP: 132/81  Pulse: 78  Temp: 98.5 F (36.9 C)  Resp: 20    General: Alert 79 year old woman laying in bed, in no acute distress.  Cardiovascular: S1, S2, with a soft systolic murmur.  Respiratory: Clear to auscultation bilaterally.  Abdomen: Positive bowel sounds, soft, mildly tender right lower  quadrant; no significant distention; no rebound or guarding or  rigidity.  Musculoskeletal/extremities: No acute hot red joints. No pedal edema.   Discharge Instructions   Discharge Instructions    Diet - low sodium heart healthy    Complete by:  As directed      Diet general    Complete by:  As directed      Discharge instructions    Complete by:  As directed   Avoid fatty and greasy foods.     Increase activity slowly    Complete by:  As directed           Current Discharge Medication List    START taking these medications   Details  HYDROcodone-acetaminophen (VICODIN) 5-500 MG per tablet Take 1 tablet by mouth every 6 (six) hours as needed for pain. Qty: 15 tablet, Refills: 0    metroNIDAZOLE (FLAGYL) 500 MG tablet Take 1 tablet (500 mg total) by mouth every 8 (eight) hours. Take for 8 more days for treatment of the infection. Qty: 24 tablet, Refills: 0    ondansetron (ZOFRAN) 4 MG tablet Take 1 tablet (4 mg total) by mouth every 6 (six) hours as needed for nausea. Qty: 20 tablet, Refills: 0      CONTINUE these medications which have CHANGED   Details  losartan (COZAAR) 50 MG tablet Take 1/2 tablet daily for 4 days and then increase it to one whole tablet daily as previously prescribed.      CONTINUE these medications which have NOT CHANGED   Details  albuterol (PROVENTIL HFA;VENTOLIN HFA) 108 (90 BASE) MCG/ACT inhaler Inhale 2 puffs into the lungs every 6 (six) hours as needed for wheezing or shortness of breath.    ALPRAZolam (XANAX) 0.5 MG tablet Take 1 tablet (0.5 mg total) by mouth 3 (three) times daily as needed for anxiety. Qty: 90 tablet, Refills: 0    aspirin EC 81 MG tablet Take 81 mg by mouth every morning.    citalopram (CELEXA) 40 MG tablet Take 1 tablet (40 mg total) by mouth daily. Qty: 90 tablet, Refills: 3    Liraglutide 18 MG/3ML SOPN Inject 0.6 mg daily x 1 week, then increase to 1.2 mg daily. Qty: 6 mL, Refills: 2    metoprolol succinate (TOPROL-XL) 100 MG 24 hr tablet Take 1 tablet (100 mg total) by  mouth every morning. Qty: 30 tablet, Refills: 5    vitamin B-12 (CYANOCOBALAMIN) 500 MCG tablet Take 500 mcg by mouth daily.    Blood Glucose Monitoring Suppl (ONE TOUCH BASIC SYSTEM) W/DEVICE KIT Use as directed to test blood sugar three times daily Dx:R73.09 Qty: 1 each, Refills: 0    furosemide (LASIX) 40 MG tablet Take 1 tablet (40 mg total) by mouth daily as needed (swelling, shortness of breath). Qty: 30 tablet, Refills: 1    glucose blood test strip Use as instructed Qty: 100 each, Refills: 6    Insulin Pen Needle 32G X 4 MM MISC Use to inject once daily. Qty: 90 each, Refills: 1    ONE TOUCH LANCETS MISC Use as directed to test blood sugar three times daily Dx:R73.09 Qty: 300 each, Refills: 2      STOP taking these medications     glipiZIDE (GLIPIZIDE XL) 5 MG 24 hr tablet      pantoprazole (PROTONIX) 40 MG tablet      simvastatin (ZOCOR) 10 MG tablet        Allergies  Allergen Reactions  . Sulfonamide Derivatives Anaphylaxis  .  Metformin And Related Diarrhea   Follow-up Information    Follow up with Arnette Norris, MD On 07/01/2014.   Specialty:  Family Medicine   Why:  at 10:00 am   Contact information:   [place] [address] [phone]        The results of significant diagnostics from this hospitalization (including imaging, microbiology, ancillary and laboratory) are listed below for reference.    Significant Diagnostic Studies: Dg Abd 1 View  06/22/2014   CLINICAL DATA:  Abdominal distention.  C difficile colitis.  EXAM: ABDOMEN - 1 VIEW  COMPARISON:  None.  FINDINGS: No evidence of dilated bowel loops. Residual oral contrast material is seen throughout the colon. Extensive aortoiliac atherosclerotic calcification noted.  IMPRESSION: Normal bowel gas pattern. Residual oral contrast noted throughout the colon.   Electronically Signed   By: Earle Gell M.D.   On: 06/22/2014 14:09   Ct Abdomen Pelvis W Contrast  06/20/2014   CLINICAL  DATA:  Nausea, vomiting, diarrhea. Sweats since 6 a.m. today. History of kidney stones, COPD, non-Hodgkin's B-cell lymphoma, CHF, lung cancer.  EXAM: CT ABDOMEN AND PELVIS WITH CONTRAST  TECHNIQUE: Multidetector CT imaging of the abdomen and pelvis was performed using the standard protocol following bolus administration of intravenous contrast.  CONTRAST:  168mL OMNIPAQUE IOHEXOL 300 MG/ML  SOLN  COMPARISON:  10/27/2013  FINDINGS: Lower chest: Lung bases are unremarkable.  Heart size is normal.  Upper abdomen: No focal abnormality identified within the liver, spleen, pancreas, or adrenal glands. Gallbladder is surgically absent.  Gastrointestinal tract: Stomach and small bowel loops are normal in appearance. The appendix is surgically absent. Colonic loops are fluid-filled, consistent with the history of diarrhea. Additionally, there appears to be mucosal enhancement of the transverse, descending, and sigmoid colon raising the question of colitis. No focal colonic lesions are identified.  Pelvis: Status post hysterectomy.  No adnexal mass identified.  Retroperitoneum: No retroperitoneal or mesenteric adenopathy. The aorta is densely calcified. No abdominal aortic aneurysm.  Abdominal wall: Unremarkable.  Osseous structures: No suspicious lytic or blastic lesions are identified. There is a superior endplate fracture of S49, consistent osteoporotic type fracture.  IMPRESSION: 1. Fluid-filled colonic loops and enhancement of the distal colonic wall, raising the question of colitis. No focal colonic lesions identified. 2. Densely calcified abdominal aorta without aneurysm. 3. Osteoporotic type superior endplate fracture of Q75. 4. Status post hysterectomy and appendectomy.   Electronically Signed   By: Nolon Nations M.D.   On: 06/20/2014 17:09   Dg Chest Port 1 View  06/24/2014   CLINICAL DATA:  One day history of wheezing  EXAM: PORTABLE CHEST - 1 VIEW  COMPARISON:  Chest radiograph January 04, 2014 and chest CT  April 14, 2014  FINDINGS: There is no edema or consolidation. There is a degree of underlying emphysematous change. Heart size and pulmonary vascularity are normal. No adenopathy. There is atherosclerotic change in the aortic arch region.  IMPRESSION: No edema or consolidation.  Underlying emphysematous change.   Electronically Signed   By: Lowella Grip III M.D.   On: 06/24/2014 16:05    Microbiology: Recent Results (from the past 240 hour(s))  Clostridium Difficile by PCR     Status: Abnormal   Collection Time: 06/20/14  6:25 PM  Result Value Ref Range Status   C difficile by pcr POSITIVE (A) NEGATIVE Final    Comment: CRITICAL RESULT CALLED TO, READ BACK BY AND VERIFIED WITH: NORMAN,B ON 06/20/14 AT 1950 BY LOY,C  Clostridium Difficile by PCR     Status: Abnormal   Collection Time: 06/21/14  9:07 AM  Result Value Ref Range Status   C difficile by pcr POSITIVE (A) NEGATIVE Final    Comment: CRITICAL RESULT CALLED TO, READ BACK BY AND VERIFIED WITH: MORRIS C. AT 1659 ON [id number] BY THOMPSON S.      Labs: Basic Metabolic Panel:  Recent Labs Lab 06/20/14 1325 06/21/14 0602 06/22/14 0546  NA 138 140 139  K 3.9 3.6 3.9  CL 103 110 113*  CO2 _0 GLUCOSE 206* 115* 116*  BUN _1 CREATININE 0.85 0.79 0.76  CALCIUM 9.5 8.3* 7.9*   Liver Function Tests:  Recent Labs Lab 06/20/14 1325 06/21/14 0602  AST 18 13  ALT 11 9  ALKPHOS 81 63  BILITOT 0.8 0.8  PROT 6.8 5.5*  ALBUMIN 4.1 3.3*    Recent Labs Lab 06/20/14 1325 06/22/14 0546  LIPASE 26 17*   No results for input(s): AMMONIA in the last 168 hours. CBC:  Recent Labs Lab 06/20/14 1325 06/21/14 0602  WBC 10.4 7.6  HGB 14.8 12.4  HCT 43.3 37.1  MCV 91.5 92.1  PLT 260 255   Cardiac Enzymes: No results for input(s): CKTOTAL, CKMB, CKMBINDEX, TROPONINI in the last 168 hours. BNP: BNP (last 3 results) No results for input(s): BNP in the last 8760 hours.  ProBNP (last 3 results)  Recent  Labs  10/30/13 0549  PROBNP 3093.0*    CBG:  Recent Labs Lab 06/23/14 1733 06/23/14 2041 06/23/14 2217 06/24/14 0735 06/24/14 1111  GLUCAP 152* 73 133* 149* 144*       Signed:  Lisvet Rasheed  [facility] 06/24/2014, 4:24 PM

## 2014-06-25 NOTE — Progress Notes (Signed)
UR chart review completed.  

## 2014-06-26 ENCOUNTER — Other Ambulatory Visit: Payer: Self-pay | Admitting: *Deleted

## 2014-06-26 MED ORDER — METOPROLOL SUCCINATE ER 100 MG PO TB24: 100.0000 mg | ORAL_TABLET | Freq: Every morning | ORAL | Status: DC

## 2014-07-01 ENCOUNTER — Encounter: Payer: Self-pay | Admitting: Family Medicine

## 2014-07-01 ENCOUNTER — Ambulatory Visit (INDEPENDENT_AMBULATORY_CARE_PROVIDER_SITE_OTHER): Payer: PPO | Admitting: Family Medicine

## 2014-07-01 VITALS — BP 132/72 | HR 77 | Temp 97.7°F | Wt 120.2 lb

## 2014-07-01 DIAGNOSIS — A0472 Enterocolitis due to Clostridium difficile, not specified as recurrent: Secondary | ICD-10-CM

## 2014-07-01 DIAGNOSIS — A047 Enterocolitis due to Clostridium difficile: Secondary | ICD-10-CM

## 2014-07-01 DIAGNOSIS — Z09 Encounter for follow-up examination after completed treatment for conditions other than malignant neoplasm: Secondary | ICD-10-CM

## 2014-07-01 DIAGNOSIS — R319 Hematuria, unspecified: Secondary | ICD-10-CM | POA: Insufficient documentation

## 2014-07-01 DIAGNOSIS — E119 Type 2 diabetes mellitus without complications: Secondary | ICD-10-CM | POA: Diagnosis not present

## 2014-07-01 DIAGNOSIS — I10 Essential (primary) hypertension: Secondary | ICD-10-CM

## 2014-07-01 LAB — POCT URINALYSIS DIPSTICK
Bilirubin, UA: NEGATIVE
Glucose, UA: NEGATIVE
Ketones, UA: NEGATIVE
NITRITE UA: NEGATIVE
PROTEIN UA: NEGATIVE
RBC UA: NEGATIVE
Spec Grav, UA: 1.01
UROBILINOGEN UA: 0.2
pH, UA: 6

## 2014-07-01 MED ORDER — ONDANSETRON HCL 4 MG PO TABS: 4.0000 mg | ORAL_TABLET | Freq: Four times a day (QID) | ORAL | Status: DC | PRN

## 2014-07-01 NOTE — Progress Notes (Signed)
Pre visit review using our clinic review tool, if applicable. No additional management support is needed unless otherwise documented below in the visit note. 

## 2014-07-01 NOTE — Assessment & Plan Note (Signed)
Well controlled today. No changes made.

## 2014-07-01 NOTE — Assessment & Plan Note (Signed)
UA neg for hematuria today, positive only for LE. Send urine for cx to make sure no signs of infection but reassurance provided as she is not currently having any symptoms.

## 2014-07-01 NOTE — Assessment & Plan Note (Signed)
New- symptoms resolving. Finish course of flagyl. Zofran eRx refilled due to persistent nausea. Call or return to clinic prn if these symptoms worsen or fail to improve as anticipated. The patient indicates understanding of these issues and agrees with the plan.
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
## 2014-07-01 NOTE — Assessment & Plan Note (Signed)
Recovering well. Still a little weak with intermittent nausea but appetite improving and no further vomiting and diarrhea. No additional rxs given.  Advised to finish course of flagyl and zofran refilled. The patient indicates understanding of these issues and agrees with the plan.

## 2014-07-03 ENCOUNTER — Encounter

## 2014-07-03 LAB — URINE CULTURE
Colony Count: NO GROWTH
Organism ID, Bacteria: NO GROWTH

## 2014-07-04 ENCOUNTER — Encounter (HOSPITAL_COMMUNITY): Payer: Self-pay | Admitting: *Deleted

## 2014-07-04 ENCOUNTER — Emergency Department (HOSPITAL_COMMUNITY)
Admission: EM | Admit: 2014-07-04 | Discharge: 2014-07-04 | Disposition: A | Discharge status: Home / Self Care | Payer: PPO | Attending: Emergency Medicine | Admitting: Emergency Medicine

## 2014-07-04 DIAGNOSIS — R531 Weakness: Secondary | ICD-10-CM | POA: Diagnosis not present

## 2014-07-04 DIAGNOSIS — Z86010 Personal history of colonic polyps: Secondary | ICD-10-CM | POA: Insufficient documentation

## 2014-07-04 DIAGNOSIS — I10 Essential (primary) hypertension: Secondary | ICD-10-CM | POA: Diagnosis not present

## 2014-07-04 DIAGNOSIS — F329 Major depressive disorder, single episode, unspecified: Secondary | ICD-10-CM | POA: Diagnosis not present

## 2014-07-04 DIAGNOSIS — Z85828 Personal history of other malignant neoplasm of skin: Secondary | ICD-10-CM | POA: Insufficient documentation

## 2014-07-04 DIAGNOSIS — R1031 Right lower quadrant pain: Secondary | ICD-10-CM | POA: Insufficient documentation

## 2014-07-04 DIAGNOSIS — I509 Heart failure, unspecified: Secondary | ICD-10-CM | POA: Diagnosis not present

## 2014-07-04 DIAGNOSIS — Z853 Personal history of malignant neoplasm of breast: Secondary | ICD-10-CM | POA: Diagnosis not present

## 2014-07-04 DIAGNOSIS — Z79899 Other long term (current) drug therapy: Secondary | ICD-10-CM | POA: Insufficient documentation

## 2014-07-04 DIAGNOSIS — F419 Anxiety disorder, unspecified: Secondary | ICD-10-CM | POA: Diagnosis not present

## 2014-07-04 DIAGNOSIS — Z86018 Personal history of other benign neoplasm: Secondary | ICD-10-CM | POA: Insufficient documentation

## 2014-07-04 DIAGNOSIS — Z8619 Personal history of other infectious and parasitic diseases: Secondary | ICD-10-CM | POA: Diagnosis not present

## 2014-07-04 DIAGNOSIS — Z9049 Acquired absence of other specified parts of digestive tract: Secondary | ICD-10-CM | POA: Insufficient documentation

## 2014-07-04 DIAGNOSIS — Z87828 Personal history of other (healed) physical injury and trauma: Secondary | ICD-10-CM | POA: Diagnosis not present

## 2014-07-04 DIAGNOSIS — Z87442 Personal history of urinary calculi: Secondary | ICD-10-CM | POA: Diagnosis not present

## 2014-07-04 DIAGNOSIS — K219 Gastro-esophageal reflux disease without esophagitis: Secondary | ICD-10-CM | POA: Diagnosis not present

## 2014-07-04 DIAGNOSIS — E119 Type 2 diabetes mellitus without complications: Secondary | ICD-10-CM | POA: Insufficient documentation

## 2014-07-04 DIAGNOSIS — R1030 Lower abdominal pain, unspecified: Secondary | ICD-10-CM

## 2014-07-04 DIAGNOSIS — Z7982 Long term (current) use of aspirin: Secondary | ICD-10-CM | POA: Diagnosis not present

## 2014-07-04 DIAGNOSIS — R197 Diarrhea, unspecified: Secondary | ICD-10-CM | POA: Diagnosis not present

## 2014-07-04 DIAGNOSIS — Z9851 Tubal ligation status: Secondary | ICD-10-CM | POA: Diagnosis not present

## 2014-07-04 DIAGNOSIS — Z794 Long term (current) use of insulin: Secondary | ICD-10-CM | POA: Insufficient documentation

## 2014-07-04 DIAGNOSIS — J449 Chronic obstructive pulmonary disease, unspecified: Secondary | ICD-10-CM | POA: Diagnosis not present

## 2014-07-04 DIAGNOSIS — R112 Nausea with vomiting, unspecified: Secondary | ICD-10-CM | POA: Diagnosis present

## 2014-07-04 LAB — CBC WITH DIFFERENTIAL/PLATELET
Basophils Absolute: 0 10*3/uL (ref 0.0–0.1)
Basophils Relative: 0 % (ref 0–1)
Eosinophils Absolute: 0.2 10*3/uL (ref 0.0–0.7)
Eosinophils Relative: 2 % (ref 0–5)
HCT: 44.9 % (ref 36.0–46.0)
Hemoglobin: 15.2 g/dL — ABNORMAL HIGH (ref 12.0–15.0)
LYMPHS ABS: 2.5 10*3/uL (ref 0.7–4.0)
LYMPHS PCT: 26 % (ref 12–46)
MCH: 31.3 pg (ref 26.0–34.0)
MCHC: 33.9 g/dL (ref 30.0–36.0)
MCV: 92.6 fL (ref 78.0–100.0)
MONOS PCT: 9 % (ref 3–12)
Monocytes Absolute: 0.8 10*3/uL (ref 0.1–1.0)
Neutro Abs: 6 10*3/uL (ref 1.7–7.7)
Neutrophils Relative %: 63 % (ref 43–77)
PLATELETS: 336 10*3/uL (ref 150–400)
RBC: 4.85 MIL/uL (ref 3.87–5.11)
RDW: 13.8 % (ref 11.5–15.5)
WBC: 9.5 10*3/uL (ref 4.0–10.5)

## 2014-07-04 LAB — URINALYSIS, ROUTINE W REFLEX MICROSCOPIC
Bilirubin Urine: NEGATIVE
GLUCOSE, UA: NEGATIVE mg/dL
HGB URINE DIPSTICK: NEGATIVE
Ketones, ur: NEGATIVE mg/dL
LEUKOCYTES UA: NEGATIVE
Nitrite: NEGATIVE
Protein, ur: NEGATIVE mg/dL
Specific Gravity, Urine: <1.005 — ABNORMAL LOW (ref 1.005–1.030)
Urobilinogen, UA: 0.2 mg/dL (ref 0.0–1.0)
pH: 6 (ref 5.0–8.0)

## 2014-07-04 LAB — COMPREHENSIVE METABOLIC PANEL
ALT: 15 U/L (ref 14–54)
ANION GAP: 10 (ref 5–15)
AST: 23 U/L (ref 15–41)
Albumin: 4.1 g/dL (ref 3.5–5.0)
Alkaline Phosphatase: 68 U/L (ref 38–126)
BUN: 13 mg/dL (ref 6–20)
CO2: 24 mmol/L (ref 22–32)
CREATININE: 0.82 mg/dL (ref 0.44–1.00)
Calcium: 9.7 mg/dL (ref 8.9–10.3)
Chloride: 105 mmol/L (ref 101–111)
GFR calc Af Amer: >60 mL/min (ref >60)
GFR calc non Af Amer: >60 mL/min (ref >60)
Glucose, Bld: 196 mg/dL — ABNORMAL HIGH (ref 65–99)
Potassium: 4.1 mmol/L (ref 3.5–5.1)
Sodium: 139 mmol/L (ref 135–145)
TOTAL PROTEIN: 6.9 g/dL (ref 6.5–8.1)
Total Bilirubin: 0.6 mg/dL (ref 0.3–1.2)

## 2014-07-04 LAB — I-STAT CG4 LACTIC ACID, ED: Lactic Acid, Venous: 2.81 mmol/L (ref 0.5–2.0)

## 2014-07-04 LAB — LIPASE, BLOOD: Lipase: 26 U/L (ref 22–51)

## 2014-07-04 LAB — CLOSTRIDIUM DIFFICILE BY PCR: Toxigenic C. Difficile by PCR: NEGATIVE

## 2014-07-04 LAB — AMYLASE: Amylase: 36 U/L (ref 28–100)

## 2014-07-04 MED ORDER — ONDANSETRON 4 MG PO TBDP: 4.0000 mg | ORAL_TABLET | Freq: Three times a day (TID) | ORAL | Status: DC | PRN

## 2014-07-04 MED ORDER — ONDANSETRON HCL 4 MG/2ML IJ SOLN
4.0000 mg | Freq: Once | INTRAMUSCULAR | Status: AC
  Administered 2014-07-04: 4 mg via INTRAVENOUS
  Filled 2014-07-04: qty 2

## 2014-07-04 MED ORDER — FENTANYL CITRATE (PF) 100 MCG/2ML IJ SOLN
50.0000 ug | Freq: Once | INTRAMUSCULAR | Status: AC
  Administered 2014-07-04: 50 ug via INTRAVENOUS
  Filled 2014-07-04: qty 2

## 2014-07-04 MED ORDER — SODIUM CHLORIDE 0.9 % IV BOLUS (SEPSIS)
500.0000 mL | Freq: Once | INTRAVENOUS | Status: AC
  Administered 2014-07-04: 500 mL via INTRAVENOUS

## 2014-07-04 MED ORDER — HYDROCODONE-ACETAMINOPHEN 5-500 MG PO TABS: 1.0000 | ORAL_TABLET | Freq: Four times a day (QID) | ORAL | Status: DC | PRN

## 2014-07-04 NOTE — ED Provider Notes (Signed)
CSN: [id number]     Arrival date & time 07/04/14  1203 History   First MD Initiated Contact with Patient 07/04/14 1300     Chief Complaint  Patient presents with  . Diarrhea  . Nausea     (Consider location/radiation/quality/duration/timing/severity/associated sxs/prior Treatment) HPI Patient presents after recent hospitalization for colitis, now with concern of ongoing abdominal pain, shakiness. Patient completed Flagyl therapy 2 days ago. Throughout her recent hospitalization, and since she has had right lower quadrant abdominal pain, midline lower abdominal pain. This is intermittent, sore, moderate. There are no clear precipitating, alleviating, exacerbating factors for this pain. There is no other abdominal pain. She also complains of generalized shaking, chills no objective fever. Patient states that she was well prior to developing her colitis. Currently she denies other new changes from baseline condition.  Past Medical History  Diagnosis Date  . Benign tumor of breast 2004    left  . Broken ribs     history of several broken ribs on right and compression  fx  . Fibroma of skin     Multiple fibromas/lipomas on arms and legs B  . Anxiety   . COPD (chronic obstructive pulmonary disease)   . Hyperlipidemia   . History of CT scan of abdomen 05/23/2003    Mild fatty liver inf, tiny hypodensity in right lobe of liver  . Pulmonary nodules   . Diabetes mellitus without complication   . Hx of radiation therapy 04/11/12- 05/08/12    left chest wall area, 40 gray 20 fx  . Hypertension   . Diverticulosis   . Adenomatous colon polyp   . Cancer     non-hodgkins b cell lymphoma  . 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 Depression   . Pyelonephritis   . Kidney stone 02/21/1981    Right/removed  . GERD (gastroesophageal reflux disease)   . Osteoarthritis     hands, degenerative back , osteoporosis   . Cold virus     seen by Dr.  Deborra Medina on 10/12, given Rx for antibiotic & steroid  . CHF (congestive heart failure)     hosp. for CHF- 10/2013  . Shortness of breath     only with movemnent   . C. difficile colitis 06/21/2014   Past Surgical History  Procedure Laterality Date  . Appendectomy  02/21/1961  . Exploratory laparotomy  02/21/1970    for ? adhesions  . Abdominal hysterectomy  02/21/1970    right ovary remaining  . Bone marrow biopsy  03/09/2012  . Lung core biopsy  02/06/2012  . Tonsillectomy    . Breast surgery  1970's    lump removed- benign   . Tubal ligation    . Eye surgery      bilateral cataracts removed - /W- IOL  . Cholecystectomy N/A 12/11/2013    Procedure: LAPAROSCOPIC CHOLECYSTECTOMY WITH INTRAOPERATIVE CHOLANGIOGRAM;  Surgeon: Erroll Luna, MD;  Location: [facility];  Service: General;  Laterality: N/A;   Family History  Problem Relation Age of Onset  . Parkinsonism Brother   . Stroke Father   . Cancer Sister   . Heart disease Brother   . Clotting disorder Father   . Kidney disease Mother    History  Substance Use Topics  . Smoking status: Current Every Day Smoker -- 0.50 packs/day for 30 years    Types: Cigarettes  . Smokeless tobacco: Never Used     Comment: 3/4 pack X 50 years  .  Alcohol Use: No   OB History    No data available     Review of Systems  Constitutional:       Per HPI, otherwise negative  HENT:       Per HPI, otherwise negative  Respiratory:       Per HPI, otherwise negative  Cardiovascular:       Per HPI, otherwise negative  Gastrointestinal: Positive for nausea, vomiting and diarrhea.       Though the patient complains of diarrhea, she has had 2 semi-formed stools today, and to yesterday.  Endocrine:       Negative aside from HPI  Genitourinary:       Neg aside from HPI   Musculoskeletal:       Per HPI, otherwise negative  Skin: Negative.   Neurological: Positive for weakness. Negative for syncope.      Allergies  Sulfonamide derivatives and  Metformin and related  Home Medications   Prior to Admission medications   Medication Sig Start Date End Date Taking? Authorizing Provider  albuterol (PROVENTIL HFA;VENTOLIN HFA) 108 (90 BASE) MCG/ACT inhaler Inhale 2 puffs into the lungs every 6 (six) hours as needed for wheezing or shortness of breath.   Yes Historical Provider, MD  ALPRAZolam Duanne Moron) 0.5 MG tablet Take 1 tablet (0.5 mg total) by mouth 3 (three) times daily as needed for anxiety. 05/27/14  Yes Lucille Passy, MD  aspirin EC 81 MG tablet Take 81 mg by mouth every morning.   Yes Historical Provider, MD  citalopram (CELEXA) 40 MG tablet Take 1 tablet (40 mg total) by mouth daily. 02/27/14  Yes Lucille Passy, MD  furosemide (LASIX) 40 MG tablet Take 1 tablet (40 mg total) by mouth daily as needed (swelling, shortness of breath). 11/02/13  Yes Kathie Dike, MD  Liraglutide 18 MG/3ML SOPN Inject 0.6 mg daily x 1 week, then increase to 1.2 mg daily. Patient taking differently: Inject 0.6 mg into the skin 2 (two) times daily.  06/17/14  Yes Philemon Kingdom, MD  losartan (COZAAR) 50 MG tablet Take 1/2 tablet daily for 4 days and then increase it to one whole tablet daily as previously prescribed. 06/24/14  Yes Rexene Alberts, MD  metoprolol succinate (TOPROL-XL) 100 MG 24 hr tablet Take 1 tablet (100 mg total) by mouth every morning. 06/26/14  Yes Lucille Passy, MD  vitamin B-12 (CYANOCOBALAMIN) 500 MCG tablet Take 500 mcg by mouth daily.   Yes Historical Provider, MD  Blood Glucose Monitoring Suppl ([place]) W/DEVICE KIT Use as directed to test blood sugar three times daily Dx:R73.09 05/01/14   Lucille Passy, MD  glucose blood test strip Use as instructed 05/01/14   Lucille Passy, MD  HYDROcodone-acetaminophen (VICODIN) 5-500 MG per tablet Take 1 tablet by mouth every 6 (six) hours as needed for pain. 07/04/14   Carmin Muskrat, MD  Insulin Pen Needle 32G X 4 MM MISC Use to inject once daily. 06/17/14   Philemon Kingdom, MD  metroNIDAZOLE  (FLAGYL) 500 MG tablet Take 1 tablet (500 mg total) by mouth every 8 (eight) hours. Take for 8 more days for treatment of the infection. Patient not taking: Reported on 07/04/2014 06/24/14   Rexene Alberts, MD  ondansetron (ZOFRAN ODT) 4 MG disintegrating tablet Take 1 tablet (4 mg total) by mouth every 8 (eight) hours as needed for nausea or vomiting. 07/04/14   Carmin Muskrat, MD  ONE [facility] LANCETS MISC Use as directed to test blood  sugar three times daily Dx:R73.09 05/01/14   Lucille Passy, MD   BP 131/73 mmHg  Pulse 92  Temp(Src) 98.7 F (37.1 C) (Oral)  Resp 20  Ht 5' 1"  (1.549 m)  Wt 120 lb (54.432 kg)  BMI 22.69 kg/m2  SpO2 97% Physical Exam  Constitutional: She is oriented to person, place, and time. She appears well-developed and well-nourished. She has a sickly appearance. No distress.  HENT:  Head: Normocephalic and atraumatic.  Eyes: Conjunctivae and EOM are normal.  Cardiovascular: Normal rate and regular rhythm.   Pulmonary/Chest: Effort normal and breath sounds normal. No stridor. No respiratory distress.  Abdominal: She exhibits no distension. There is tenderness in the right lower quadrant and suprapubic area.  Musculoskeletal: She exhibits no edema.  Neurological: She is alert and oriented to person, place, and time. No cranial nerve deficit.  Skin: Skin is warm and dry.  Psychiatric: She has a normal mood and affect.  Nursing note and vitals reviewed.   ED Course  Procedures (including critical care time) Labs Review Labs Reviewed  CBC WITH DIFFERENTIAL/PLATELET - Abnormal; Notable for the following:    Hemoglobin 15.2 (*)    All other components within normal limits  COMPREHENSIVE METABOLIC PANEL - Abnormal; Notable for the following:    Glucose, Bld 196 (*)    All other components within normal limits  URINALYSIS, ROUTINE W REFLEX MICROSCOPIC - Abnormal; Notable for the following:    APPearance CLOUDY (*)    Specific Gravity, Urine <1.005 (*)    All other  components within normal limits  I-STAT CG4 LACTIC ACID, ED - Abnormal; Notable for the following:    Lactic Acid, Venous 2.81 (*)    All other components within normal limits  CLOSTRIDIUM DIFFICILE BY PCR  AMYLASE  LIPASE, BLOOD    Imaging Review No results found.   EKG Interpretation None     I reviewed the patient's chart, including discharge summary from recent hospitalization for colitis.  I reviewed the results (including imaging as performed), agree with the interpretation  On repeat exam the patient appears better.  We reviewed all findings.  MDM   Final diagnoses:  Lower abdominal pain    Patient presents after recent episode of Clostridium difficile colitis, now with ongoing abdominal pain. Patient acknowledges having solid stool, denies fever. Patient has no evidence for peritonitis, labs, vitals largely reassuring, though there is evidence for dehydration. Patient improved with fluid resuscitation here. Repeat C. difficile test was negative. Patient was discharged with additional analgesics, antiemetics, encouraged to drink plenty of fluids, and f/u w PMD.    Carmin Muskrat, MD 07/04/14 [id number]

## 2014-07-04 NOTE — Discharge Instructions (Signed)
As discussed, your evaluation today has been largely reassuring.  But, it is important that you monitor your condition carefully, and do not hesitate to return to the ED if you develop new, or concerning changes in your condition.  The recovery from colitis can take weeks to months.  Please follow-up with your physician for appropriate ongoing care.   Abdominal Pain Many things can cause abdominal pain. Usually, abdominal pain is not caused by a disease and will improve without treatment. It can often be observed and treated at home. Your health care provider will do a physical exam and possibly order blood tests and X-rays to help determine the seriousness of your pain. However, in many cases, more time must pass before a clear cause of the pain can be found. Before that point, your health care provider may not know if you need more testing or further treatment. HOME CARE INSTRUCTIONS  Monitor your abdominal pain for any changes. The following actions may help to alleviate any discomfort you are experiencing:  Only take over-the-counter or prescription medicines as directed by your health care provider.  Do not take laxatives unless directed to do so by your health care provider.  Try a clear liquid diet (broth, tea, or water) as directed by your health care provider. Slowly move to a bland diet as tolerated. SEEK MEDICAL CARE IF:  You have unexplained abdominal pain.  You have abdominal pain associated with nausea or diarrhea.  You have pain when you urinate or have a bowel movement.  You experience abdominal pain that wakes you in the night.  You have abdominal pain that is worsened or improved by eating food.  You have abdominal pain that is worsened with eating fatty foods.  You have a fever. SEEK IMMEDIATE MEDICAL CARE IF:   Your pain does not go away within 2 hours.  You keep throwing up (vomiting).  Your pain is felt only in portions of the abdomen, such as the right  side or the left lower portion of the abdomen.  You pass bloody or black tarry stools. MAKE SURE YOU:  Understand these instructions.   Will watch your condition.   Will get help right away if you are not doing well or get worse.  Document Released: 11/17/2004 Document Revised: 02/12/2013 Document Reviewed: 10/17/2012 [facility] Patient Information 2015 [place], [place]. This information is not intended to replace advice given to you by your health care provider. Make sure you discuss any questions you have with your health care provider.

## 2014-07-04 NOTE — ED Notes (Signed)
Pt has c- diff and colitis.  Was just discharged from hospital one week ago.  Had 2 loose stools this am, with nausea.

## 2014-07-04 NOTE — ED Notes (Addendum)
Nausea, abd and back pain, 2 bms today.  Chills  shakey , Recent c diff and says she feels she has recurrence.of c diff.

## 2014-07-08 ENCOUNTER — Encounter

## 2014-07-08 ENCOUNTER — Inpatient Hospital Stay: Admit: 2014-07-08 | Payer: MEDICARE | Attending: Surgery | Primary: Family Medicine

## 2014-07-08 DIAGNOSIS — R109 Unspecified abdominal pain: Secondary | ICD-10-CM

## 2014-07-10 ENCOUNTER — Ambulatory Visit: Payer: PPO | Admitting: Family Medicine

## 2014-07-10 ENCOUNTER — Encounter: Payer: Self-pay | Admitting: Primary Care

## 2014-07-10 ENCOUNTER — Ambulatory Visit (INDEPENDENT_AMBULATORY_CARE_PROVIDER_SITE_OTHER): Payer: PPO | Admitting: Primary Care

## 2014-07-10 VITALS — BP 122/72 | HR 80 | Temp 97.5°F | Ht 61.0 in | Wt 118.0 lb

## 2014-07-10 DIAGNOSIS — R1031 Right lower quadrant pain: Secondary | ICD-10-CM

## 2014-07-10 DIAGNOSIS — R110 Nausea: Secondary | ICD-10-CM | POA: Diagnosis not present

## 2014-07-10 LAB — COMPREHENSIVE METABOLIC PANEL
ALT: 13 U/L (ref 0–35)
AST: 16 U/L (ref 0–37)
Albumin: 4.1 g/dL (ref 3.5–5.2)
Alkaline Phosphatase: 68 U/L (ref 39–117)
BILIRUBIN TOTAL: 0.5 mg/dL (ref 0.2–1.2)
BUN: 11 mg/dL (ref 6–23)
CO2: 32 mEq/L (ref 19–32)
Calcium: 10.2 mg/dL (ref 8.4–10.5)
Chloride: 101 mEq/L (ref 96–112)
Creatinine, Ser: 0.72 mg/dL (ref 0.40–1.20)
GFR: 83.44 mL/min (ref >60.00)
GLUCOSE: 112 mg/dL — AB (ref 70–99)
Potassium: 4.4 mEq/L (ref 3.5–5.1)
SODIUM: 137 meq/L (ref 135–145)
TOTAL PROTEIN: 6.8 g/dL (ref 6.0–8.3)

## 2014-07-10 LAB — CBC WITH DIFFERENTIAL/PLATELET
Basophils Absolute: 0.1 10*3/uL (ref 0.0–0.1)
Basophils Relative: 0.6 % (ref 0.0–3.0)
EOS PCT: 2.5 % (ref 0.0–5.0)
Eosinophils Absolute: 0.2 10*3/uL (ref 0.0–0.7)
HCT: 43.2 % (ref 36.0–46.0)
Hemoglobin: 14.5 g/dL (ref 12.0–15.0)
LYMPHS PCT: 27.4 % (ref 12.0–46.0)
Lymphs Abs: 2.4 10*3/uL (ref 0.7–4.0)
MCHC: 33.6 g/dL (ref 30.0–36.0)
MCV: 91.8 fl (ref 78.0–100.0)
Monocytes Absolute: 0.7 10*3/uL (ref 0.1–1.0)
Monocytes Relative: 8.4 % (ref 3.0–12.0)
NEUTROS PCT: 61.1 % (ref 43.0–77.0)
Neutro Abs: 5.4 10*3/uL (ref 1.4–7.7)
PLATELETS: 339 10*3/uL (ref 150.0–400.0)
RBC: 4.7 Mil/uL (ref 3.87–5.11)
RDW: 14.6 % (ref 11.5–15.5)
WBC: 8.8 10*3/uL (ref 4.0–10.5)

## 2014-07-10 MED ORDER — ONDANSETRON 4 MG PO TBDP: 4.0000 mg | ORAL_TABLET | Freq: Three times a day (TID) | ORAL | Status: DC | PRN

## 2014-07-10 MED ORDER — HYDROCODONE-ACETAMINOPHEN 5-500 MG PO TABS: 1.0000 | ORAL_TABLET | Freq: Three times a day (TID) | ORAL | Status: DC | PRN
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
## 2014-07-10 NOTE — Patient Instructions (Addendum)
Start taking Omeprazole 20 mg tablets. Take 1 tablet by mouth daily for burping and reflux. You may purchase these over the counter. Complete lab work prior to leaving today. I will notify you of your results. I have sent refills for your Zofran (nausea) and Hydrocodone (pain medicine). Follow up with PCP if symptoms do no improve in 2 weeks.  Gastroesophageal Reflux Disease, Adult Gastroesophageal reflux disease (GERD) happens when acid from your stomach flows up into the esophagus. When acid comes in contact with the esophagus, the acid causes soreness (inflammation) in the esophagus. Over time, GERD may create small holes (ulcers) in the lining of the esophagus. CAUSES   Increased body weight. This puts pressure on the stomach, making acid rise from the stomach into the esophagus.  Smoking. This increases acid production in the stomach.  Drinking alcohol. This causes decreased pressure in the lower esophageal sphincter (valve or ring of muscle between the esophagus and stomach), allowing acid from the stomach into the esophagus.  Late evening meals and a full stomach. This increases pressure and acid production in the stomach.  A malformed lower esophageal sphincter. Sometimes, no cause is found. SYMPTOMS   Burning pain in the lower part of the mid-chest behind the breastbone and in the mid-stomach area. This may occur twice a week or more often.  Trouble swallowing.  Sore throat.  Dry cough.  Asthma-like symptoms including chest tightness, shortness of breath, or wheezing. DIAGNOSIS  Your caregiver may be able to diagnose GERD based on your symptoms. In some cases, X-rays and other tests may be done to check for complications or to check the condition of your stomach and esophagus. TREATMENT  Your caregiver may recommend over-the-counter or prescription medicines to help decrease acid production. Ask your caregiver before starting or adding any new medicines.  HOME CARE  INSTRUCTIONS   Change the factors that you can control. Ask your caregiver for guidance concerning weight loss, quitting smoking, and alcohol consumption.  Avoid foods and drinks that make your symptoms worse, such as:  Caffeine or alcoholic drinks.  Chocolate.  Peppermint or mint flavorings.  Garlic and onions.  Spicy foods.  Citrus fruits, such as oranges, lemons, or limes.  Tomato-based foods such as sauce, chili, salsa, and pizza.  Fried and fatty foods.  Avoid lying down for the 3 hours prior to your bedtime or prior to taking a nap.  Eat small, frequent meals instead of large meals.  Wear loose-fitting clothing. Do not wear anything tight around your waist that causes pressure on your stomach.  Raise the head of your bed 6 to 8 inches with wood blocks to help you sleep. Extra pillows will not help.  Only take over-the-counter or prescription medicines for pain, discomfort, or fever as directed by your caregiver.  Do not take aspirin, ibuprofen, or other nonsteroidal anti-inflammatory drugs (NSAIDs). SEEK IMMEDIATE MEDICAL CARE IF:   You have pain in your arms, neck, jaw, teeth, or back.  Your pain increases or changes in intensity or duration.  You develop nausea, vomiting, or sweating (diaphoresis).  You develop shortness of breath, or you faint.  Your vomit is green, yellow, black, or looks like coffee grounds or blood.  Your stool is red, bloody, or black. These symptoms could be signs of other problems, such as heart disease, gastric bleeding, or esophageal bleeding. MAKE SURE YOU:   Understand these instructions.  Will watch your condition.  Will get help right away if you are not doing well or  get worse. Document Released: 11/17/2004 Document Revised: 05/02/2011 Document Reviewed: 08/27/2010 [facility] Patient Information 2015 [place], [place]. This information is not intended to replace advice given to you by your health care provider. Make sure you  discuss any questions you have with your health care provider.   Food Choices for Gastroesophageal Reflux Disease When you have gastroesophageal reflux disease (GERD), the foods you eat and your eating habits are very important. Choosing the right foods can help ease the discomfort of GERD. WHAT GENERAL GUIDELINES DO I NEED TO FOLLOW?  Choose fruits, vegetables, whole grains, low-fat dairy products, and low-fat meat, fish, and poultry.  Limit fats such as oils, salad dressings, butter, nuts, and avocado.  Keep a food diary to identify foods that cause symptoms.  Avoid foods that cause reflux. These may be different for different people.  Eat frequent small meals instead of three large meals each day.  Eat your meals slowly, in a relaxed setting.  Limit fried foods.  Cook foods using methods other than frying.  Avoid drinking alcohol.  Avoid drinking large amounts of liquids with your meals.  Avoid bending over or lying down until 2-3 hours after eating. WHAT FOODS ARE NOT RECOMMENDED? The following are some foods and drinks that may worsen your symptoms: Vegetables Tomatoes. Tomato juice. Tomato and spaghetti sauce. Chili peppers. Onion and garlic. Horseradish. Fruits Oranges, grapefruit, and lemon (fruit and juice). Meats High-fat meats, fish, and poultry. This includes hot dogs, ribs, ham, sausage, salami, and bacon. Dairy Whole milk and chocolate milk. Sour cream. Cream. Butter. Ice cream. Cream cheese.  Beverages Coffee and tea, with or without caffeine. Carbonated beverages or energy drinks. Condiments Hot sauce. Barbecue sauce.  Sweets/Desserts Chocolate and cocoa. Donuts. Peppermint and spearmint. Fats and Oils High-fat foods, including [place] fries and potato chips. Other Vinegar. Strong spices, such as black pepper, white pepper, red pepper, cayenne, curry powder, cloves, ginger, and chili powder. The items listed above may not be a complete list of foods and  beverages to avoid. Contact your dietitian for more information. Document Released: 02/07/2005 Document Revised: 02/12/2013 Document Reviewed: 12/12/2012 [facility] Patient Information 2015 [place], [place]. This information is not intended to replace advice given to you by your health care provider. Make sure you discuss any questions you have with your health care provider.

## 2014-07-10 NOTE — Progress Notes (Signed)
Pre visit review using our clinic review tool, if applicable. No additional management support is needed unless otherwise documented below in the visit note. 

## 2014-07-11 ENCOUNTER — Ambulatory Visit: Payer: PPO | Admitting: Internal Medicine

## 2014-07-11 ENCOUNTER — Telehealth: Payer: Self-pay

## 2014-07-11 LAB — LACTIC ACID, PLASMA: LACTIC ACID: 2.8 mmol/L — ABNORMAL HIGH (ref 0.5–2.2)

## 2014-07-11 NOTE — Telephone Encounter (Signed)
This can be changed to 5/325.  Thanks.

## 2014-07-11 NOTE — Telephone Encounter (Signed)
Marie Holmes with [facility] of  request cb with verification of med for hydrocodone apap 5-500 mg; Marie Holmes cannot find and wants to know if could be changed to 5-'300mg'$ .Please advise.

## 2014-07-11 NOTE — Telephone Encounter (Signed)
Ok per [place] to change Rx to 5-'300mg'$ . Called and notified [facility] in [place].

## 2014-07-15 ENCOUNTER — Other Ambulatory Visit: Payer: Self-pay | Admitting: *Deleted

## 2014-07-15 MED ORDER — ALPRAZOLAM 0.5 MG PO TABS: 0.5000 mg | ORAL_TABLET | Freq: Three times a day (TID) | ORAL | Status: DC | PRN

## 2014-07-15 NOTE — Telephone Encounter (Signed)
Last f/u appt 03/2014 

## 2014-07-16 ENCOUNTER — Telehealth: Payer: Self-pay | Admitting: Family Medicine

## 2014-07-16 NOTE — Telephone Encounter (Signed)
Spoke to pt and scheduled f/u appt 

## 2014-07-16 NOTE — Telephone Encounter (Signed)
Spoke to pt who states that the pt is so fatigued, she is not even able to sit up on the couch. [name] states pt stays in the bed all the day and will not get dressed. [name] is very concerned about the rate of her mother's health decrease and is wanting advice.

## 2014-07-16 NOTE — Telephone Encounter (Signed)
Please call Marie Holmes back at your convenience regarding pt.  Pt is not getting any better and is declining and daughter would like Dr Deborra Medina to call.  Best number is [phone].

## 2014-07-16 NOTE — Telephone Encounter (Signed)
I am concerned as well.  Please have her come see me 30 minutes please.

## 2014-07-16 NOTE — Telephone Encounter (Signed)
Rx called in to requested pharmacy 

## 2014-07-22 ENCOUNTER — Encounter: Payer: Self-pay | Admitting: Family Medicine

## 2014-07-22 ENCOUNTER — Other Ambulatory Visit: Payer: Self-pay | Admitting: Family Medicine

## 2014-07-22 ENCOUNTER — Ambulatory Visit: Payer: PPO | Admitting: Family Medicine

## 2014-07-22 ENCOUNTER — Ambulatory Visit (INDEPENDENT_AMBULATORY_CARE_PROVIDER_SITE_OTHER): Payer: PPO | Admitting: Family Medicine

## 2014-07-22 ENCOUNTER — Encounter: Payer: Self-pay | Admitting: *Deleted

## 2014-07-22 VITALS — BP 108/66 | HR 90 | Temp 98.1°F | Wt 116.2 lb

## 2014-07-22 DIAGNOSIS — R1031 Right lower quadrant pain: Secondary | ICD-10-CM | POA: Insufficient documentation

## 2014-07-22 DIAGNOSIS — R1013 Epigastric pain: Secondary | ICD-10-CM | POA: Diagnosis not present

## 2014-07-22 DIAGNOSIS — F17200 Nicotine dependence, unspecified, uncomplicated: Secondary | ICD-10-CM

## 2014-07-22 DIAGNOSIS — R634 Abnormal weight loss: Secondary | ICD-10-CM | POA: Insufficient documentation

## 2014-07-22 DIAGNOSIS — R251 Tremor, unspecified: Secondary | ICD-10-CM | POA: Insufficient documentation

## 2014-07-22 DIAGNOSIS — Z139 Encounter for screening, unspecified: Secondary | ICD-10-CM

## 2014-07-22 DIAGNOSIS — Z720 Tobacco use: Secondary | ICD-10-CM

## 2014-07-22 LAB — CBC
HEMATOCRIT: 44.8 % (ref 36.0–46.0)
Hemoglobin: 15.1 g/dL — ABNORMAL HIGH (ref 12.0–15.0)
MCHC: 33.8 g/dL (ref 30.0–36.0)
MCV: 92.1 fl (ref 78.0–100.0)
Platelets: 276 10*3/uL (ref 150.0–400.0)
RBC: 4.87 Mil/uL (ref 3.87–5.11)
RDW: 14.2 % (ref 11.5–15.5)
WBC: 10.2 10*3/uL (ref 4.0–10.5)

## 2014-07-22 LAB — H. PYLORI ANTIBODY, IGG: H PYLORI IGG: NEGATIVE

## 2014-07-22 LAB — LIPASE: Lipase: 16 U/L (ref 11.0–59.0)

## 2014-07-22 MED ORDER — LOSARTAN POTASSIUM 50 MG PO TABS: ORAL_TABLET | ORAL | Status: DC

## 2014-07-22 NOTE — Patient Instructions (Signed)
Good to see you. We are referring you to a neurologist and to get an ultrasound of your aorta. I will call you with your lab results.

## 2014-07-22 NOTE — Assessment & Plan Note (Signed)
Persistent without obstipation or vomiting.  No signs of SBO at this point but will need to re evaluate this given recent abdominal surgeries if symptoms worsen. The patient indicates understanding of these issues and agrees with the plan.

## 2014-07-22 NOTE — Assessment & Plan Note (Signed)
Persistent issue which is most likely multifactorial. CT done last month reassuring.  Lipase and CT neg for pancreatitis.  Not having any difficulty swallowing- less concerning for esophageal malignancy. Negative colonoscopy 2011[name], 10 year recall. ?h ylori- will check this today. If neg, advised to touch base with oncology again. The patient indicates understanding of these issues and agrees with the plan.

## 2014-07-22 NOTE — Assessment & Plan Note (Signed)
Deteriorating with pos FH of PD. Refer to neuro for evaluation. The patient indicates understanding of these issues and agrees with the plan.

## 2014-07-22 NOTE — Progress Notes (Signed)
Pre visit review using our clinic review tool, if applicable. No additional management support is needed unless otherwise documented below in the visit note. 
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
## 2014-07-25 ENCOUNTER — Ambulatory Visit (HOSPITAL_COMMUNITY): Payer: PPO | Attending: Internal Medicine

## 2014-07-25 ENCOUNTER — Other Ambulatory Visit: Payer: Self-pay

## 2014-07-25 DIAGNOSIS — Z1231 Encounter for screening mammogram for malignant neoplasm of breast: Secondary | ICD-10-CM

## 2014-07-25 DIAGNOSIS — R1031 Right lower quadrant pain: Secondary | ICD-10-CM

## 2014-07-25 DIAGNOSIS — F17200 Nicotine dependence, unspecified, uncomplicated: Secondary | ICD-10-CM

## 2014-07-25 DIAGNOSIS — R1013 Epigastric pain: Secondary | ICD-10-CM | POA: Diagnosis not present

## 2014-07-25 DIAGNOSIS — Z720 Tobacco use: Secondary | ICD-10-CM | POA: Insufficient documentation

## 2014-07-25 DIAGNOSIS — Z139 Encounter for screening, unspecified: Secondary | ICD-10-CM

## 2014-07-25 NOTE — Other (Addendum)
[facility]  PREOPERATIVE INSTRUCTIONS    Surgery Date:   07/29/14  Surgery arrival time given by surgeon: NO   If ???no???,[facility] will call you between 4 PM- 8 PM the day before surgery with your arrival time. If your surgery is on a Monday, we will call you the preceding Friday.       Please call [phone] after 8 PM if you did not receive your arrival time.    1. Please report at the designated time to the 2nd Floor Admitting Desk.  Bring your insurance card, photo identification, and any copayment ( if applicable).  2. You must have a responsible adult to drive you home. You need to have a responsible adult to stay with you the first 24 hours after surgery if you are going home the same day of your surgery and you should not drive a car for 24 hours following your surgery.  3. Nothing to eat or drink after midnight the night before surgery. This includes no water, gum, mints, coffee, juice, etc.  Please note special instructions, if applicable, below for medications.  4. MEDICATIONS TO TAKE THE MORNING OF SURGERY WITH A SIP OF WATER: tramadol,wellbutrin,protonix,celexa, levoxyl,propranolol,gabapentin. Pt states she will stay on ASA from now until 07/28/14 as directed by Dr. Laural BenesJohnson.    5.   6. No alcoholic beverages 24 hours before or after your surgery.  7. If you are being admitted to the hospital,please leave personal belongings/luggage in your car until you have an assigned hospital room number.  8. Stop Aspirin and/or any non-steroidal anti-inflammatory drugs (i.e. Ibuprofen, Naproxen, Advil, Aleve) as directed by your surgeon.  You may take Tylenol.        Stop herbal supplements 1 week prior to  surgery.  9. If you are currently taking Plavix, Coumadin,or any other blood-thinning/anticoagulant medication contact your surgeon for instructions.  10. Please wear comfortable clothes. Wear your glasses instead of contacts. We ask that all money, jewelry and valuables be left at home.  Wear no make up, particularly mascara, the day of surgery.   11.  All body piercings, rings,and jewelry need to be removed and left at home.    Please wear your hair loose or down. Please no pony-tails, buns, or any metal hair accessories. If you shower the morning of surgery, please do not apply any lotions, powders, or deodorants afterwards.   Do not shave any body area within 24 hours of your surgery.  12. Please follow all instructions to avoid any potential surgical cancellation.  13.  Should your physical condition change, (i.e. fever, cold, flu, etc.) please notify your surgeon as soon as possible.  14. It is important to be on time. If a situation occurs where you may be delayed, please call:  [phone] / [phone] on the day of surgery.  15. The Preadmission Testing staff can be reached at [phone].Marland Kitchen.  16. Special instructions: free valet parking.      The patient was contacted  via phone.   She  verbalize  understanding of all instructions does not  need reinforcement.

## 2014-07-25 NOTE — Other (Signed)
Pt states that Dr Laural BenesJohnson wants her to stay on her ASA 325mg  po daily until the day before the procedure.

## 2014-07-29 ENCOUNTER — Inpatient Hospital Stay: Payer: MEDICARE

## 2014-07-29 MED ORDER — LACTATED RINGERS IV
INTRAVENOUS | Status: DC
Start: 2014-07-29 — End: 2014-07-29

## 2014-07-29 MED ORDER — LIDOCAINE (PF) 20 MG/ML (2 %) IJ SOLN
20 mg/mL (2 %) | INTRAMUSCULAR | Status: DC | PRN
Start: 2014-07-29 — End: 2014-07-29
  Administered 2014-07-29: 18:00:00 via INTRAVENOUS

## 2014-07-29 MED ORDER — PROPOFOL 10 MG/ML IV EMUL
10 mg/mL | INTRAVENOUS | Status: DC | PRN
Start: 2014-07-29 — End: 2014-07-29
  Administered 2014-07-29: 18:00:00 via INTRAVENOUS

## 2014-07-29 MED ORDER — ROCURONIUM 10 MG/ML IV
10 mg/mL | INTRAVENOUS | Status: DC | PRN
Start: 2014-07-29 — End: 2014-07-29
  Administered 2014-07-29 (×3): via INTRAVENOUS

## 2014-07-29 MED ORDER — FENTANYL CITRATE (PF) 50 MCG/ML IJ SOLN
50 mcg/mL | INTRAMUSCULAR | Status: AC
Start: 2014-07-29 — End: ?

## 2014-07-29 MED ORDER — FENTANYL CITRATE (PF) 50 MCG/ML IJ SOLN
50 mcg/mL | INTRAMUSCULAR | Status: DC | PRN
Start: 2014-07-29 — End: 2014-07-29
  Administered 2014-07-29 (×3): via INTRAVENOUS

## 2014-07-29 MED ORDER — BUPIVACAINE LIPOSOME (PF) 266 MG/20 ML (13.3 MG/ML) SUSP, INFILTRATION
1.3 % (13.3 mg/mL) | Status: AC
Start: 2014-07-29 — End: ?

## 2014-07-29 MED ORDER — ALBUTEROL SULFATE 0.083 % (0.83 MG/ML) SOLN FOR INHALATION
2.5 mg /3 mL (0.083 %) | RESPIRATORY_TRACT | Status: DC | PRN
Start: 2014-07-29 — End: 2014-07-29

## 2014-07-29 MED ORDER — BUPIVACAINE (PF) 0.5 % (5 MG/ML) IJ SOLN
0.5 % (5 mg/mL) | INTRAMUSCULAR | Status: DC | PRN
Start: 2014-07-29 — End: 2014-07-29
  Administered 2014-07-29: 19:00:00

## 2014-07-29 MED ORDER — HYDROMORPHONE (PF) 1 MG/ML IJ SOLN
1 mg/mL | INTRAMUSCULAR | Status: DC | PRN
Start: 2014-07-29 — End: 2014-07-29

## 2014-07-29 MED ORDER — NEOSTIGMINE METHYLSULFATE 1 MG/ML INJECTION
1 mg/mL | INTRAMUSCULAR | Status: DC | PRN
Start: 2014-07-29 — End: 2014-07-29
  Administered 2014-07-29: 19:00:00 via INTRAVENOUS

## 2014-07-29 MED ORDER — LIDOCAINE-EPINEPHRINE 1 %-1:100,000 IJ SOLN
1 %-:00,000 | INTRAMUSCULAR | Status: AC
Start: 2014-07-29 — End: ?

## 2014-07-29 MED ORDER — LACTATED RINGERS IV
INTRAVENOUS | Status: DC
Start: 2014-07-29 — End: 2014-07-29
  Administered 2014-07-29 (×2): via INTRAVENOUS

## 2014-07-29 MED ORDER — HYDROCODONE-ACETAMINOPHEN 5 MG-325 MG TAB
5-325 mg | ORAL | Status: AC
Start: 2014-07-29 — End: 2014-07-29
  Administered 2014-07-29: 21:00:00 via ORAL

## 2014-07-29 MED ORDER — CEFAZOLIN 2 GRAM/50 ML NS IVPB
Freq: Once | INTRAVENOUS | Status: AC
Start: 2014-07-29 — End: 2014-07-29
  Administered 2014-07-29: 17:00:00 via INTRAVENOUS

## 2014-07-29 MED ORDER — DOCUSATE SODIUM 50 MG CAP
50 mg | ORAL_CAPSULE | Freq: Two times a day (BID) | ORAL | Status: AC
Start: 2014-07-29 — End: 2014-10-27

## 2014-07-29 MED ORDER — BUPIVACAINE (PF) 0.5 % (5 MG/ML) IJ SOLN
0.5 % (5 mg/mL) | INTRAMUSCULAR | Status: AC
Start: 2014-07-29 — End: ?

## 2014-07-29 MED ORDER — ONDANSETRON (PF) 4 MG/2 ML INJECTION
4 mg/2 mL | INTRAMUSCULAR | Status: DC | PRN
Start: 2014-07-29 — End: 2014-07-29

## 2014-07-29 MED ORDER — MEPERIDINE (PF) 25 MG/ML INJ SOLUTION
25 mg/ml | INTRAMUSCULAR | Status: DC | PRN
Start: 2014-07-29 — End: 2014-07-29

## 2014-07-29 MED ORDER — LIDOCAINE (PF) 10 MG/ML (1 %) IJ SOLN
10 mg/mL (1 %) | INTRAMUSCULAR | Status: DC | PRN
Start: 2014-07-29 — End: 2014-07-29

## 2014-07-29 MED ORDER — GLYCOPYRROLATE 0.2 MG/ML IJ SOLN
0.2 mg/mL | INTRAMUSCULAR | Status: DC | PRN
Start: 2014-07-29 — End: 2014-07-29
  Administered 2014-07-29: 19:00:00 via INTRAVENOUS

## 2014-07-29 MED ORDER — HYDROCODONE-ACETAMINOPHEN 5 MG-325 MG TAB
5-325 mg | ORAL | Status: DC | PRN
Start: 2014-07-29 — End: 2014-07-29

## 2014-07-29 MED ORDER — SUCCINYLCHOLINE CHLORIDE 20 MG/ML INJECTION
20 mg/mL | INTRAMUSCULAR | Status: DC | PRN
Start: 2014-07-29 — End: 2014-07-29
  Administered 2014-07-29: 18:00:00 via INTRAVENOUS

## 2014-07-29 MED ORDER — DIPHENHYDRAMINE HCL 50 MG/ML IJ SOLN
50 mg/mL | INTRAMUSCULAR | Status: DC | PRN
Start: 2014-07-29 — End: 2014-07-29

## 2014-07-29 MED ORDER — ONDANSETRON (PF) 4 MG/2 ML INJECTION
4 mg/2 mL | INTRAMUSCULAR | Status: DC | PRN
Start: 2014-07-29 — End: 2014-07-29
  Administered 2014-07-29: 19:00:00 via INTRAVENOUS

## 2014-07-29 MED ORDER — DEXAMETHASONE SODIUM PHOSPHATE 4 MG/ML IJ SOLN
4 mg/mL | INTRAMUSCULAR | Status: DC | PRN
Start: 2014-07-29 — End: 2014-07-29
  Administered 2014-07-29: 18:00:00 via INTRAVENOUS

## 2014-07-29 MED ORDER — BUPIVACAINE LIPOSOME (PF) 266 MG/20 ML (13.3 MG/ML) SUSP, INFILTRATION
1.3 % (13.3 mg/mL) | Freq: Once | Status: AC
Start: 2014-07-29 — End: 2014-07-29
  Administered 2014-07-29: 19:00:00

## 2014-07-29 MED ORDER — MIDAZOLAM 1 MG/ML IJ SOLN
1 mg/mL | INTRAMUSCULAR | Status: AC
Start: 2014-07-29 — End: ?

## 2014-07-29 MED ORDER — DEXAMETHASONE SODIUM PHOSPHATE 4 MG/ML IJ SOLN
4 mg/mL | INTRAMUSCULAR | Status: AC
Start: 2014-07-29 — End: ?

## 2014-07-29 MED ORDER — HYDROCODONE-ACETAMINOPHEN 5 MG-325 MG TAB
5-325 mg | ORAL_TABLET | ORAL | Status: AC | PRN
Start: 2014-07-29 — End: ?

## 2014-07-29 MED FILL — FENTANYL CITRATE (PF) 50 MCG/ML IJ SOLN: 50 mcg/mL | INTRAMUSCULAR | Qty: 5

## 2014-07-29 MED FILL — EXPAREL (PF) 1.3 % (13.3 MG/ML) SUSPENSION FOR LOCAL INFILTRATION: 1.3 % (13.3 mg/mL) | Qty: 20

## 2014-07-29 MED FILL — HYDROCODONE-ACETAMINOPHEN 5 MG-325 MG TAB: 5-325 mg | ORAL | Qty: 2

## 2014-07-29 MED FILL — DEXAMETHASONE SODIUM PHOSPHATE 4 MG/ML IJ SOLN: 4 mg/mL | INTRAMUSCULAR | Qty: 1

## 2014-07-29 MED FILL — LACTATED RINGERS IV: INTRAVENOUS | Qty: 1000

## 2014-07-29 MED FILL — MIDAZOLAM 1 MG/ML IJ SOLN: 1 mg/mL | INTRAMUSCULAR | Qty: 2

## 2014-07-29 MED FILL — CEFAZOLIN 2 GRAM/50 ML NS IVPB: INTRAVENOUS | Qty: 50

## 2014-07-29 MED FILL — BUPIVACAINE (PF) 0.5 % (5 MG/ML) IJ SOLN: 0.5 % (5 mg/mL) | INTRAMUSCULAR | Qty: 30

## 2014-07-29 MED FILL — LIDOCAINE-EPINEPHRINE 1 %-1:100,000 IJ SOLN: 1 %-:00,000 | INTRAMUSCULAR | Qty: 20

## 2014-07-29 NOTE — Brief Op Note (Addendum)
BRIEF OPERATIVE NOTE    Date of Procedure: 07/29/2014   Preoperative Diagnosis: RECURRENT LEFT LUMBAR HERNIA  Postoperative Diagnosis: RECURRENT LEFT LUMBAR HERNIA    Procedure(s):  LAPAROSCOPIC RECURRENT LEFT LUMBAR HERNIA REPAIR WITH MESH  Surgeon(s) and Role:     * Lerry LinerBryan A Lan Mcneill, MD - Primary  Anesthesia: General   Estimated Blood Loss: Min  Specimens:   ID Type Source Tests Collected by Time Destination   1 : HERNIA SAC Preservative Abdomen  Lerry LinerBryan A Inge Waldroup, MD 07/29/2014 1509 Pathology      Findings: 3 cm defect (IMG004, IMG005).  Primary closure (IMG006).  Complications: none  Implants:   Implant Name Type Inv. Item Serial No. Manufacturer Lot No. LRB No. Used Action   MESH HERN FLAT SHT 7.5X15CM --  - [id number]   MESH HERN FLAT SHT 7.5X15CM --  Selena Battenn/a BARD DAVOL [id number] Left 1 Implanted                 [id number]

## 2014-07-29 NOTE — Discharge Summary (Signed)
Discharge Summary    Patient: Amy Galloway               Sex: female          DOA: 07/29/2014 11:43 AM       Date of Birth:  1935/07/19      Age:  79 y.o.        LOS:                 Discharge Date: 07/29/2014    Admission Diagnoses: RECURRENT LEFT LUMBAR HERNIA    Discharge Diagnoses:  Same    Procedure:  Procedure(s):  LAPAROSCOPIC LEFT LUMBAR HERNIA REPAIR WITH MESH    Discharge Condition: Good    Hospital Course: Uremarkable operative procedure.  Discharge to home in stable condition.      Consults: None    Significant Diagnostic Studies: Not applicable    Discharge Medications:     Current Discharge Medication List      START taking these medications    Details   HYDROcodone-acetaminophen (NORCO) 5-325 mg per tablet Take 1-2 Tabs by mouth every four (4) hours as needed. Max Daily Amount: 12 Tabs. Indications: PAIN  Qty: 30 Tab, Refills: 0      !! docusate sodium (COLACE) 50 mg capsule Take 2 Caps by mouth two (2) times a day for 90 days. Indications: CONSTIPATION  Qty: 60 Cap, Refills: 2       !! - Potential duplicate medications found. Please discuss with provider.      CONTINUE these medications which have NOT CHANGED    Details   acetaminophen (TYLENOL) 650 mg CR tablet Take 650 mg by mouth every six (6) hours as needed for Pain.      ascorbic acid (VITAMIN C) 500 mg tablet Take 500 mg by mouth.      VIT A/VIT C/VIT E/ZINC/COPPER (PRESERVISION AREDS PO) Take  by mouth two (2) times a day.      methenamine hippurate (HIPREX) 1 gram tablet Take 1 g by mouth two (2) times daily (with meals).      hydrochlorothiazide (HYDRODIURIL) 12.5 mg tablet Take 12.5 mg by mouth daily.      traMADol 50 mg TbDL Take  by mouth as needed.    Associated Diagnoses: Palpitation; PAC (premature atrial contraction); PVC (premature ventricular contraction)      pantoprazole (PROTONIX) 20 mg tablet Take 20 mg by mouth daily.      losartan (COZAAR) 25 mg tablet Take  by mouth daily.       aspirin (ASPIRIN) 325 mg tablet Take 325 mg by mouth daily.      levothyroxine (LEVOXYL) 150 mcg tablet Take 150 mcg by mouth Daily (before breakfast).        propranolol (INDERAL) 20 mg tablet Take 20 mg by mouth three (3) times daily.        citalopram (CELEXA) 20 mg tablet Take 20 mg by mouth daily.        buPROPion SR (WELLBUTRIN SR) 150 mg SR tablet Take 150 mg by mouth two (2) times a day.        fluticasone (FLONASE) 50 mcg/actuation nasal spray 2 Sprays by Both Nostrils route as needed.      !! DOCUSATE SODIUM (COLACE PO) Take 250 mg by mouth two (2) times a day.        gabapentin (NEURONTIN) 300 mg capsule Take 300 mg by mouth as needed.      B INFANTIS/B ANI/B LON/B BIFID (  PROBIOTIC 4X PO) Take  by mouth nightly.      ergocalciferol (VITAMIN D2) 50,000 unit capsule Take 50,000 Units by mouth. Twice a month       PYRIDOXINE HCL (VITAMIN B-6 PO) Take 500 mg by mouth daily.        cyanocobalamin (VITAMIN B-12) 1,000 mcg tablet Take 1,000 mcg by mouth nightly.      CALCIUM CITRATE-VITAMIN D3 PO Take 2 Caps by mouth nightly.      Cholecalciferol, Vitamin D3, (VITAMIN D3) 1,000 unit Cap Take 1 Tab by mouth daily.         !! - Potential duplicate medications found. Please discuss with provider.        Activity/Diet/Wound Care: See patient administered discharge instructions.    Follow-up: 2 weeks    Lerry LinerBryan A Adonia Porada, MD  [facility]  Pager:  [phone]  Office:  [phone]

## 2014-07-29 NOTE — H&P (Signed)
Patient interviewed and examined again.  No change to paper H/P.    Madysun Thall A Jaden Abreu, MD

## 2014-07-29 NOTE — Anesthesia Pre-Procedure Evaluation (Signed)
Anesthetic History     PONV          Review of Systems / Medical History  Patient summary reviewed, nursing notes reviewed and pertinent labs reviewed    Pulmonary  Within defined limits                 Neuro/Psych   Within defined limits           Cardiovascular    Hypertension        Dysrhythmias (SVT controlled with beta blcoker) : SVT           GI/Hepatic/Renal     GERD           Endo/Other      Hypothyroidism  Arthritis     Other Findings              Physical Exam    Airway  Mallampati: II  TM Distance: 4 - 6 cm  Neck ROM: normal range of motion   Mouth opening: Normal     Cardiovascular    Rhythm: regular  Rate: normal         Dental  No notable dental hx       Pulmonary  Breath sounds clear to auscultation               Abdominal         Other Findings            Anesthetic Plan    ASA: 3  Anesthesia type: general            Anesthetic plan and risks discussed with: Patient

## 2014-07-29 NOTE — Anesthesia Post-Procedure Evaluation (Signed)
Post-Anesthesia Evaluation and Assessment    Patient: Amy Galloway MRN: [id number]  SSN: [id number]    Date of Birth: 10/11/1935  Age: 79 y.o.  Sex: female       Cardiovascular Function/Vital Signs  Visit Vitals   Item Reading   ??? BP 106/54 mmHg   ??? Pulse 75   ??? Temp 36.2 ??C (97.2 ??F)   ??? Resp 14   ??? Ht 5\' 4"  (1.626 m)   ??? Wt 89.16 kg (196 lb 9 oz)   ??? BMI 33.72 kg/m2   ??? SpO2 92%       Patient is status post general anesthesia for Procedure(s):  LAPAROSCOPIC LEFT LUMBAR HERNIA REPAIR WITH MESH.    Nausea/Vomiting: None    Postoperative hydration reviewed and adequate.    Pain:  Pain Scale 1: Numeric (0 - 10) (07/29/14 1610)  Pain Intensity 1: 0 (07/29/14 1610)   Managed    Neurological Status:   Neuro (WDL): Within Defined Limits (07/29/14 1610)  Neuro  LUE Motor Response: Purposeful (07/29/14 1610)  LLE Motor Response: Purposeful (07/29/14 1610)  RUE Motor Response: Purposeful (07/29/14 1610)  RLE Motor Response: Purposeful (07/29/14 1610)   At baseline    Mental Status and Level of Consciousness: Alert and oriented     Pulmonary Status:   O2 Device: Room air (07/29/14 1610)   Adequate oxygenation and airway patent    Complications related to anesthesia: None    Post-anesthesia assessment completed. No concerns    Signed By: Maud DeedJames  [place] Latulippe, DO     July 29, 2014

## 2014-07-30 MED FILL — GLYCOPYRROLATE 0.2 MG/ML IJ SOLN: 0.2 mg/mL | INTRAMUSCULAR | Qty: 0.4

## 2014-07-30 MED FILL — DIPRIVAN 10 MG/ML INTRAVENOUS EMULSION: 10 mg/mL | INTRAVENOUS | Qty: 140

## 2014-07-30 MED FILL — ONDANSETRON (PF) 4 MG/2 ML INJECTION: 4 mg/2 mL | INTRAMUSCULAR | Qty: 4

## 2014-07-30 MED FILL — ROCURONIUM 10 MG/ML IV: 10 mg/mL | INTRAVENOUS | Qty: 50

## 2014-07-30 MED FILL — QUELICIN 20 MG/ML INJECTION SOLUTION: 20 mg/mL | INTRAMUSCULAR | Qty: 100

## 2014-07-30 MED FILL — NEOSTIGMINE METHYLSULFATE 1 MG/ML INJECTION: 1 mg/mL | INTRAMUSCULAR | Qty: 2

## 2014-07-30 MED FILL — XYLOCAINE-MPF 20 MG/ML (2 %) INJECTION SOLUTION: 20 mg/mL (2 %) | INTRAMUSCULAR | Qty: 60

## 2014-07-30 MED FILL — DEXAMETHASONE SODIUM PHOSPHATE 4 MG/ML IJ SOLN: 4 mg/mL | INTRAMUSCULAR | Qty: 8

## 2014-07-31 ENCOUNTER — Encounter: Payer: Self-pay | Admitting: Neurology

## 2014-07-31 ENCOUNTER — Ambulatory Visit (INDEPENDENT_AMBULATORY_CARE_PROVIDER_SITE_OTHER): Payer: PPO | Admitting: Neurology

## 2014-07-31 ENCOUNTER — Other Ambulatory Visit (INDEPENDENT_AMBULATORY_CARE_PROVIDER_SITE_OTHER): Payer: PPO

## 2014-07-31 VITALS — BP 148/74 | HR 90 | Ht 61.0 in | Wt 116.3 lb

## 2014-07-31 DIAGNOSIS — R292 Abnormal reflex: Secondary | ICD-10-CM

## 2014-07-31 DIAGNOSIS — R2681 Unsteadiness on feet: Secondary | ICD-10-CM | POA: Diagnosis not present

## 2014-07-31 DIAGNOSIS — Z716 Tobacco abuse counseling: Secondary | ICD-10-CM

## 2014-07-31 DIAGNOSIS — R251 Tremor, unspecified: Secondary | ICD-10-CM | POA: Diagnosis not present

## 2014-07-31 DIAGNOSIS — Z720 Tobacco use: Secondary | ICD-10-CM

## 2014-07-31 DIAGNOSIS — R61 Generalized hyperhidrosis: Secondary | ICD-10-CM

## 2014-07-31 LAB — SEDIMENTATION RATE: Sed Rate: 14 mm/hr (ref 0–22)

## 2014-07-31 LAB — TSH: TSH: 0.47 u[IU]/mL (ref 0.35–4.50)
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
## 2014-08-04 ENCOUNTER — Ambulatory Visit
Admission: RE | Admit: 2014-08-04 | Discharge: 2014-08-04 | Disposition: A | Discharge status: Home / Self Care | Payer: PPO | Source: Ambulatory Visit

## 2014-08-04 DIAGNOSIS — Z1231 Encounter for screening mammogram for malignant neoplasm of breast: Secondary | ICD-10-CM

## 2014-08-04 LAB — COPPER, SERUM: COPPER: 136 ug/dL (ref 70–175)

## 2014-08-04 NOTE — Op Note (Signed)
Name:      Amy Galloway                                          Surgeon:        Lerry Liner, MD  Account #: [id number]                 Surgery Date:   07/29/2014  DOB:       Jun 27, 1935  Age:       79                           Location:                                 OPERATIVE REPORT      PREOPERATIVE DIAGNOSIS: Recurrent left superior lumbar hernia.    POSTOPERATIVE DIAGNOSIS: Recurrent left superior lumbar hernia.    PROCEDURES PERFORMED: Laparoscopic repair of recurrent left superior lumbar  hernia repair with mesh.    SURGEON: Lerry Liner, MD    ANESTHESIA: General.    ESTIMATED BLOOD LOSS: Minimal.    SPECIMENS REMOVED: Hernia sac.    FINDINGS: A 3 cm defect with primary closure.    COMPLICATIONS: None.    IMPLANTS: A 7.5 x 15 cm soft mesh.    INDICATIONS: This is a 79 year old female with a previous primary superior  lumbar hernia repair with mesh in an open fashion. The hernia recurred  within a month. On subsequent CT scan, there was herniated colon through the superior  left lumbar hernia defect. The patient was not obstructed or obstipated on clinical  follow up.  She was consented for a laparoscopic repair.    DESCRIPTION OF PROCEDURE:  I had Amy Galloway identify any issues with lying on  Her right side pre-operatively, and there were no problems identified.  The patient was  taken to the operating room, initially placed in supine position. The SCDs were   turned on and working. After the successful start of endotracheal anesthesia, a foley   was placed.  She was then gently placed in right lateral decubitus position in similar fashion  As she put herself in in the pre-operative area.  The beanbag was evacuated and   molded to her position.  Straps were gently placed.  Abdomen was prepped   and drapedin a normal sterile fashion. Time-out was confirmed, verifying any   special needs.   Insufflation was established through a 21mm left upper quadrant incision at Palmer's point   with a Veress  needle.   Insufflation was started and maintained at 15 mmHg. The  camera was introduced through the tissue dissection  There was no evidence of underlying structures or organs. Two further ports  were then used to accommodate 5 mm and 12 mm ports along the left abdomen.  The hernia sac was visualized in the left upper quadrant with no herniated contents  visualized. The preperitoneal plane was then dissected away and the hernia sac  removed.  The defect was measured 3 cm. A 2-0 V-Loc suture was  then used to completely reapproximate the superior lumbar triangle defect.  The internal oblique and lumborum were pulled together to reapproximate the  defect just below the 12th rib.  The transversalis along the  roof   of the defect was then seen and a bite taken with each suture throw.   At the completion of the primary repair, all layers of the triangle were   reapproximated. A 7.5 x 15 mm soft  mesh was introduced into the abdomen. The soft mesh was then oriented to  the lumbar hernia closure to allow for appropriate overlap. A laparoscopic  tacker was then used to tack the mesh in place about the closed defect,  taking care to avoid any nerve entrapment.  The peritoneal flap was closed over   the soft mesh and completely exclude bare mesh from  the intraperitoneal contents. The hernia sac was passed off for  pathological analysis. Hemostasis was satisfactory. Exparel was then  expanded with 20 mL of saline and used as a nerve block along the edges of  the hernia repair and the mesh tacks.    The abdomen was then desufflated. The 12 mm port was closed with a 0 Vicryl  suture. The patient tolerated the procedure well and was extubated and  transferred to the post-anesthesia care unit in a stable condition. I will  discuss the findings of the case with her husband in the waiting area.        Reviewed on 08/04/2014 1:56 PM          Lerry Liner, MD    cc:   Lerry Liner, MD        BAJ/wmx; D: 08/04/2014 12:08 P; T:  08/04/2014 01:52 P; Doc# [id number]; Job#  [id number]

## 2014-08-04 NOTE — Op Note (Signed)
Name:      Amy Galloway                                          Surgeon:        Lerry Liner, MD  Account #: [id number]                 Surgery Date:   07/29/2014  DOB:       1935-03-22  Age:       79                           Location:                                 OPERATIVE REPORT      PREOPERATIVE DIAGNOSIS: Recurrent left superior lumbar hernia.    POSTOPERATIVE DIAGNOSIS: Recurrent left superior lumbar hernia.    PROCEDURES PERFORMED: Laparoscopic repair of recurrent left superior lumbar  hernia repair with mesh.    SURGEON: Lerry Liner, MD    ANESTHESIA: General.    ESTIMATED BLOOD LOSS: Minimal.    SPECIMENS REMOVED: Hernia sac.    FINDINGS: A 3 cm defect with primary closure.    COMPLICATIONS: None.    IMPLANTS: A 7.5 x 15 cm soft mesh.    INDICATIONS: This is a 79 year old female with a previous primary superior  lumbar hernia repair with mesh in an open fashion. The hernia recurred  within a month. On subsequent CT scan, there was herniated colon through the superior  left lumbar hernia defect. The patient was not obstructed or obstipated on clinical  follow up.  She was consented for a laparoscopic repair.    DESCRIPTION OF PROCEDURE:  I had Ms. Keeven identify any issues with lying on  Her right side pre-operatively, and there were no problems identified.  The patient was  taken to the operating room, initially placed in supine position. The SCDs were   turned on and working. After the successful start of endotracheal anesthesia, a foley   was placed.  She was then gently placed in right lateral decubitus position in similar fashion  As she put herself in in the pre-operative area.  The beanbag was evacuated and   molded to her position.  Straps were gently placed.  Abdomen was prepped   and drapedin a normal sterile fashion. Time-out was confirmed, verifying any   special needs.   Insufflation was established through a 5mm left upper quadrant incision at Palmer's point    with a Veress needle.   Insufflation was started and maintained at 15 mmHg. The  camera was introduced through the tissue dissection  There was no evidence of underlying structures or organs. Two further ports  were then used to accommodate 5 mm and 12 mm ports along the left abdomen.  The hernia sac was visualized in the left upper quadrant with no herniated contents  visualized. The preperitoneal plane was then dissected away and the hernia sac  removed.  The defect was measured 3 cm. A 2-0 V-Loc suture was  then used to completely reapproximate the superior lumbar triangle defect.  The internal oblique and lumborum were pulled together to reapproximate the  defect just below the 12th rib.  The transversalis along the  roof   of the defect was then seen and a bite taken with each suture throw.   At the completion of the primary repair, all layers of the triangle were   reapproximated. A 7.5 x 15 mm soft  mesh was introduced into the abdomen. The soft mesh was then oriented to  the lumbar hernia closure to allow for appropriate overlap. A laparoscopic  tacker was then used to tack the mesh in place about the closed defect,  taking care to avoid any nerve entrapment.  The peritoneal flap was closed over   the soft mesh and completely exclude bare mesh from  the intraperitoneal contents. The hernia sac was passed off for  pathological analysis. Hemostasis was satisfactory. Exparel was then  expanded with 20 mL of saline and used as a nerve block along the edges of  the hernia repair and the mesh tacks.    The abdomen was then desufflated. The 12 mm port was closed with a 0 Vicryl  suture. The patient tolerated the procedure well and was extubated and  transferred to the post-anesthesia care unit in a stable condition. I will  discuss the findings of the case with her husband in the waiting area.        Reviewed on 08/04/2014 1:56 PM          Lerry Liner, MD    cc:   Lerry Liner, MD         BAJ/wmx; D: 08/04/2014 12:08 P; T: 08/04/2014 01:52 P; Doc# [id number]; Job#  [id number]

## 2014-08-05 ENCOUNTER — Telehealth: Payer: Self-pay | Admitting: Neurology

## 2014-08-05 LAB — CERULOPLASMIN

## 2014-08-05 NOTE — Telephone Encounter (Signed)
Sorry.  I just sent you a note about her.  No, I think that the yield of doing it is probably fairly low unless she really wants to go ahead and do it.

## 2014-08-05 NOTE — Telephone Encounter (Signed)
[facility] called in regards to pt and would like a call back at [phone] Opt#2 and [phone]

## 2014-08-05 NOTE — Telephone Encounter (Signed)
Spoke with patient and made her aware labs normal. She does not wish to have this lab re-drawn. She will proceed with MR.

## 2014-08-05 NOTE — Telephone Encounter (Signed)
Called and spoke with [facility] who state that patient's ceruloplasmin was unable to be run because the sample was sent to them frozen from [facility] lab when it was to be refrigerated. Patient will have to have this re-drawn. Should I send patient directly to [facility] this time?

## 2014-08-14 ENCOUNTER — Ambulatory Visit
Admission: RE | Admit: 2014-08-14 | Discharge: 2014-08-14 | Disposition: A | Discharge status: Home / Self Care | Payer: PPO | Source: Ambulatory Visit | Attending: Neurology | Admitting: Neurology

## 2014-08-14 ENCOUNTER — Telehealth: Payer: Self-pay | Admitting: Neurology

## 2014-08-14 DIAGNOSIS — R292 Abnormal reflex: Secondary | ICD-10-CM

## 2014-08-14 DIAGNOSIS — R251 Tremor, unspecified: Secondary | ICD-10-CM

## 2014-08-14 DIAGNOSIS — R2681 Unsteadiness on feet: Secondary | ICD-10-CM

## 2014-08-14 MED ORDER — GADOBENATE DIMEGLUMINE 529 MG/ML IV SOLN
10.0000 mL | Freq: Once | INTRAVENOUS | Status: AC | PRN
  Administered 2014-08-14: 10 mL via INTRAVENOUS

## 2014-08-14 NOTE — Telephone Encounter (Signed)
You can let pt know that MRI brain didn't show anything new.  She was going to think about whether or not she wanted to start med for tremor and let us know when called her for MRI results so please ask her if she wants to try something or wait.

## 2014-08-15 NOTE — Telephone Encounter (Signed)
Spoke with patient daughter Marie Holmes about results she states they were going to talk about the medication more today and she would call back to let us no weather or not she will move forward or not.

## 2014-08-15 NOTE — Telephone Encounter (Signed)
Patients daughter called back she states her mother does not want to try any medication at this time may discuss at her next follow up appointment

## 2014-08-25 ENCOUNTER — Emergency Department (HOSPITAL_COMMUNITY)
Admission: EM | Admit: 2014-08-25 | Discharge: 2014-08-25 | Disposition: A | Discharge status: Home / Self Care | Payer: PPO | Attending: Emergency Medicine | Admitting: Emergency Medicine

## 2014-08-25 ENCOUNTER — Other Ambulatory Visit (HOSPITAL_COMMUNITY): Payer: PPO

## 2014-08-25 ENCOUNTER — Emergency Department (HOSPITAL_COMMUNITY): Payer: PPO

## 2014-08-25 ENCOUNTER — Encounter (HOSPITAL_COMMUNITY): Payer: Self-pay | Admitting: Emergency Medicine

## 2014-08-25 DIAGNOSIS — F329 Major depressive disorder, single episode, unspecified: Secondary | ICD-10-CM | POA: Diagnosis not present

## 2014-08-25 DIAGNOSIS — Z85118 Personal history of other malignant neoplasm of bronchus and lung: Secondary | ICD-10-CM | POA: Insufficient documentation

## 2014-08-25 DIAGNOSIS — Z86018 Personal history of other benign neoplasm: Secondary | ICD-10-CM | POA: Insufficient documentation

## 2014-08-25 DIAGNOSIS — E119 Type 2 diabetes mellitus without complications: Secondary | ICD-10-CM | POA: Insufficient documentation

## 2014-08-25 DIAGNOSIS — I10 Essential (primary) hypertension: Secondary | ICD-10-CM | POA: Diagnosis not present

## 2014-08-25 DIAGNOSIS — R251 Tremor, unspecified: Secondary | ICD-10-CM | POA: Insufficient documentation

## 2014-08-25 DIAGNOSIS — F419 Anxiety disorder, unspecified: Secondary | ICD-10-CM | POA: Diagnosis not present

## 2014-08-25 DIAGNOSIS — J449 Chronic obstructive pulmonary disease, unspecified: Secondary | ICD-10-CM | POA: Diagnosis not present

## 2014-08-25 DIAGNOSIS — Z7982 Long term (current) use of aspirin: Secondary | ICD-10-CM | POA: Insufficient documentation

## 2014-08-25 DIAGNOSIS — I509 Heart failure, unspecified: Secondary | ICD-10-CM | POA: Diagnosis not present

## 2014-08-25 DIAGNOSIS — M1990 Unspecified osteoarthritis, unspecified site: Secondary | ICD-10-CM | POA: Diagnosis not present

## 2014-08-25 DIAGNOSIS — N898 Other specified noninflammatory disorders of vagina: Secondary | ICD-10-CM | POA: Insufficient documentation

## 2014-08-25 DIAGNOSIS — R52 Pain, unspecified: Secondary | ICD-10-CM

## 2014-08-25 DIAGNOSIS — E785 Hyperlipidemia, unspecified: Secondary | ICD-10-CM | POA: Diagnosis not present

## 2014-08-25 DIAGNOSIS — Z794 Long term (current) use of insulin: Secondary | ICD-10-CM | POA: Diagnosis not present

## 2014-08-25 DIAGNOSIS — Z87442 Personal history of urinary calculi: Secondary | ICD-10-CM | POA: Diagnosis not present

## 2014-08-25 DIAGNOSIS — R1031 Right lower quadrant pain: Secondary | ICD-10-CM | POA: Diagnosis present

## 2014-08-25 DIAGNOSIS — G8929 Other chronic pain: Secondary | ICD-10-CM | POA: Diagnosis not present

## 2014-08-25 DIAGNOSIS — Z86010 Personal history of colonic polyps: Secondary | ICD-10-CM | POA: Diagnosis not present

## 2014-08-25 DIAGNOSIS — Z720 Tobacco use: Secondary | ICD-10-CM | POA: Insufficient documentation

## 2014-08-25 DIAGNOSIS — R109 Unspecified abdominal pain: Secondary | ICD-10-CM

## 2014-08-25 DIAGNOSIS — Z87440 Personal history of urinary (tract) infections: Secondary | ICD-10-CM | POA: Diagnosis not present

## 2014-08-25 DIAGNOSIS — Z79899 Other long term (current) drug therapy: Secondary | ICD-10-CM | POA: Insufficient documentation

## 2014-08-25 HISTORY — DX: Urinary tract infection, site not specified: N39.0

## 2014-08-25 LAB — COMPREHENSIVE METABOLIC PANEL
ALBUMIN: 3.8 g/dL (ref 3.5–5.0)
ALK PHOS: 89 U/L (ref 38–126)
ALT: 13 U/L — ABNORMAL LOW (ref 14–54)
AST: 18 U/L (ref 15–41)
Anion gap: 10 (ref 5–15)
BUN: 12 mg/dL (ref 6–20)
CALCIUM: 9.6 mg/dL (ref 8.9–10.3)
CHLORIDE: 103 mmol/L (ref 101–111)
CO2: 26 mmol/L (ref 22–32)
Creatinine, Ser: 0.83 mg/dL (ref 0.44–1.00)
GFR calc non Af Amer: >60 mL/min (ref >60)
GLUCOSE: 156 mg/dL — AB (ref 65–99)
POTASSIUM: 4.2 mmol/L (ref 3.5–5.1)
Sodium: 139 mmol/L (ref 135–145)
TOTAL PROTEIN: 6.2 g/dL — AB (ref 6.5–8.1)
Total Bilirubin: 0.7 mg/dL (ref 0.3–1.2)

## 2014-08-25 LAB — CBC WITH DIFFERENTIAL/PLATELET
BASOS ABS: 0 10*3/uL (ref 0.0–0.1)
BASOS PCT: 0 % (ref 0–1)
Eosinophils Absolute: 0.2 10*3/uL (ref 0.0–0.7)
Eosinophils Relative: 3 % (ref 0–5)
HCT: 44.4 % (ref 36.0–46.0)
HEMOGLOBIN: 15.4 g/dL — AB (ref 12.0–15.0)
Lymphocytes Relative: 29 % (ref 12–46)
Lymphs Abs: 2.4 10*3/uL (ref 0.7–4.0)
MCH: 31.6 pg (ref 26.0–34.0)
MCHC: 34.7 g/dL (ref 30.0–36.0)
MCV: 91 fL (ref 78.0–100.0)
MONO ABS: 0.7 10*3/uL (ref 0.1–1.0)
Monocytes Relative: 9 % (ref 3–12)
Neutro Abs: 4.8 10*3/uL (ref 1.7–7.7)
Neutrophils Relative %: 59 % (ref 43–77)
PLATELETS: 270 10*3/uL (ref 150–400)
RBC: 4.88 MIL/uL (ref 3.87–5.11)
RDW: 13.1 % (ref 11.5–15.5)
WBC: 8.1 10*3/uL (ref 4.0–10.5)

## 2014-08-25 LAB — LIPASE, BLOOD: Lipase: 27 U/L (ref 22–51)

## 2014-08-25 LAB — WET PREP, GENITAL
Clue Cells Wet Prep HPF POC: NONE SEEN
Trich, Wet Prep: NONE SEEN
Yeast Wet Prep HPF POC: NONE SEEN

## 2014-08-25 LAB — URINALYSIS, ROUTINE W REFLEX MICROSCOPIC
Bilirubin Urine: NEGATIVE
Glucose, UA: NEGATIVE mg/dL
HGB URINE DIPSTICK: NEGATIVE
KETONES UR: NEGATIVE mg/dL
NITRITE: NEGATIVE
PH: 6 (ref 5.0–8.0)
Protein, ur: NEGATIVE mg/dL
Specific Gravity, Urine: 1.005 (ref 1.005–1.030)
UROBILINOGEN UA: 0.2 mg/dL (ref 0.0–1.0)

## 2014-08-25 LAB — URINE MICROSCOPIC-ADD ON

## 2014-08-25 MED ORDER — IOHEXOL 300 MG/ML  SOLN
100.0000 mL | Freq: Once | INTRAMUSCULAR | Status: AC | PRN
  Administered 2014-08-25: 100 mL via INTRAVENOUS

## 2014-08-25 MED ORDER — SODIUM CHLORIDE 0.9 % IV BOLUS (SEPSIS)
1000.0000 mL | Freq: Once | INTRAVENOUS | Status: AC
Start: 2014-08-25 — End: 2014-08-25
  Administered 2014-08-25: 1000 mL via INTRAVENOUS

## 2014-08-25 MED ORDER — IOHEXOL 300 MG/ML  SOLN
25.0000 mL | Freq: Once | INTRAMUSCULAR | Status: AC | PRN
  Administered 2014-08-25: 25 mL via ORAL

## 2014-08-25 MED ORDER — MORPHINE SULFATE 4 MG/ML IJ SOLN
4.0000 mg | Freq: Once | INTRAMUSCULAR | Status: AC
  Administered 2014-08-25: 4 mg via INTRAVENOUS
  Filled 2014-08-25: qty 1

## 2014-08-25 NOTE — ED Notes (Signed)
MD at bedside. 

## 2014-08-25 NOTE — ED Notes (Signed)
Pt returned from scans. Monitored by pulse ox, bp cuff, and 12-lead.

## 2014-08-25 NOTE — ED Provider Notes (Signed)
CSN: [id number]     Arrival date & time 08/25/14  1035 History   First MD Initiated Contact with Patient 08/25/14 1112     Chief Complaint  Patient presents with  . Abdominal Pain  . Tremors     (Consider location/radiation/quality/duration/timing/severity/associated sxs/prior Treatment) HPI Patient complains of tremors, diffuse for the past 8 months. Becoming worse over the past 3 months. She's been evaluated by neurolgy for tremors and had MRI scan of the brain performed 08/14/2014 which showed no acute disease. She also complains of right lower quadrant pain for approximate past 3 weeks, diminished appetite. She had formerly been treated with hydrocodone for pain but has run out. Patient also reports diminished energy and diminished appetite for several months. Her daughter reports that she was confused 3 weeks ago when she called her daughter by another name, however she does not seem confused now per her daughter. Other associated symptoms include diminished energy for several months, and diminished appetite for several months., And 30 pound weight loss over the past 6 months. She was she reports having a prescribed medication for tremors by neurologist however she has not taken it. Last bowel movement yesterday, normal. No blood per rectum no hematemesis no fever no urinary symptoms. Nothing makes pain and abdomen better or worse. Pain is constant. Other associated symptoms include vaginal discharge for 2 weeks. Of note patient vehemently denies sexual abuse Past Medical History  Diagnosis Date  . Benign tumor of breast 2004    left  . Broken ribs     history of several broken ribs on right and compression  fx  . Fibroma of skin     Multiple fibromas/lipomas on arms and legs B  . Anxiety   . COPD (chronic obstructive pulmonary disease)   . Hyperlipidemia   . History of CT scan of abdomen 05/23/2003    Mild fatty liver inf, tiny hypodensity in right lobe of liver  . Pulmonary nodules    . Diabetes mellitus without complication   . Hx of radiation therapy 04/11/12- 05/08/12    left chest wall area, 40 gray 20 fx  . Hypertension   . Diverticulosis   . Adenomatous colon polyp   . Cancer     non-hodgkins b cell lymphoma  . 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 Depression   . Pyelonephritis   . Kidney stone 02/21/1981    Right/removed  . GERD (gastroesophageal reflux disease)   . Osteoarthritis     hands, degenerative back , osteoporosis   . Cold virus     seen by Dr. Deborra Medina on 10/12, given Rx for antibiotic & steroid  . CHF (congestive heart failure)     hosp. for CHF- 10/2013  . Shortness of breath     only with movemnent   . C. difficile colitis 06/21/2014  . UTI (lower urinary tract infection)    Past Surgical History  Procedure Laterality Date  . Appendectomy  02/21/1961  . Exploratory laparotomy  02/21/1970    for ? adhesions  . Abdominal hysterectomy  02/21/1970    right ovary remaining  . Bone marrow biopsy  03/09/2012  . Lung core biopsy  02/06/2012  . Tonsillectomy    . Breast surgery  1970's    lump removed- benign   . Tubal ligation    . Eye surgery      bilateral cataracts removed - /W- IOL  . Cholecystectomy N/A 12/11/2013  Procedure: LAPAROSCOPIC CHOLECYSTECTOMY WITH INTRAOPERATIVE CHOLANGIOGRAM;  Surgeon: Erroll Luna, MD;  Location: [facility];  Service: General;  Laterality: N/A;   Family History  Problem Relation Age of Onset  . Parkinsonism Brother   . Stroke Father   . Cancer Sister   . Heart disease Brother   . Clotting disorder Father   . Kidney disease Mother    History  Substance Use Topics  . Smoking status: Current Every Day Smoker -- 0.50 packs/day for 30 years    Types: Cigarettes  . Smokeless tobacco: Never Used     Comment: 3/4 pack X 50 years  . Alcohol Use: No   OB History    No data available     Review of Systems  Constitutional: Positive for unexpected weight  change.  HENT: Negative.   Respiratory: Negative.   Cardiovascular: Negative.   Gastrointestinal: Positive for abdominal pain.  Genitourinary:       Vaginal discharge for 2 weeks  Musculoskeletal: Negative.   Skin: Negative.   Neurological: Positive for tremors and weakness.       Generalized weakness  Psychiatric/Behavioral: Negative.   All other systems reviewed and are negative.     Allergies  Sulfonamide derivatives and Metformin and related  Home Medications   Prior to Admission medications   Medication Sig Start Date End Date Taking? Authorizing Provider  albuterol (PROVENTIL HFA;VENTOLIN HFA) 108 (90 BASE) MCG/ACT inhaler Inhale 2 puffs into the lungs every 6 (six) hours as needed for wheezing or shortness of breath.    Historical Provider, MD  ALPRAZolam Duanne Moron) 0.5 MG tablet Take 1 tablet (0.5 mg total) by mouth 3 (three) times daily as needed for anxiety. 07/15/14   Lucille Passy, MD  aspirin EC 81 MG tablet Take 81 mg by mouth every morning.    Historical Provider, MD  Blood Glucose Monitoring Suppl (ONE TOUCH BASIC SYSTEM) W/DEVICE KIT Use as directed to test blood sugar three times daily Dx:R73.09 05/01/14   Lucille Passy, MD  citalopram (CELEXA) 40 MG tablet Take 1 tablet (40 mg total) by mouth daily. 02/27/14   Lucille Passy, MD  furosemide (LASIX) 40 MG tablet Take 1 tablet (40 mg total) by mouth daily as needed (swelling, shortness of breath). 11/02/13   Kathie Dike, MD  glucose blood test strip Use as instructed 05/01/14   Lucille Passy, MD  HYDROcodone-acetaminophen (VICODIN) 5-500 MG per tablet Take 1 tablet by mouth every 8 (eight) hours as needed for pain. 07/10/14   Pleas Koch, NP  Insulin Pen Needle 32G X 4 MM MISC Use to inject once daily. 06/17/14   Philemon Kingdom, MD  Liraglutide 18 MG/3ML SOPN Inject 0.6 mg daily x 1 week, then increase to 1.2 mg daily. Patient taking differently: Inject 0.6 mg into the skin 2 (two) times daily.  06/17/14   Philemon Kingdom, MD  losartan (COZAAR) 50 MG tablet Take 1/2 tablet daily for 4 days and then increase it to one whole tablet daily as previously prescribed. 07/22/14   Lucille Passy, MD  metoprolol succinate (TOPROL-XL) 100 MG 24 hr tablet Take 1 tablet (100 mg total) by mouth every morning. 06/26/14   Lucille Passy, MD  omeprazole (PRILOSEC) 20 MG capsule Take 20 mg by mouth daily.    Historical Provider, MD  ondansetron (ZOFRAN ODT) 4 MG disintegrating tablet Take 1 tablet (4 mg total) by mouth every 8 (eight) hours as needed for nausea or vomiting. 07/10/14  Pleas Koch, NP  ONE [facility] LANCETS MISC Use as directed to test blood sugar three times daily Dx:R73.09 05/01/14   Lucille Passy, MD  Probiotic Product (ALIGN PO) Take by mouth.    Historical Provider, MD  vitamin B-12 (CYANOCOBALAMIN) 500 MCG tablet Take 500 mcg by mouth daily.    Historical Provider, MD   BP 140/63 mmHg  Pulse 81  Resp 19  SpO2 100% Physical Exam  Constitutional: She is oriented to person, place, and time. She appears well-developed and well-nourished. No distress.  HENT:  Head: Normocephalic and atraumatic.  Eyes: Conjunctivae are normal. Pupils are equal, round, and reactive to light.  Neck: Neck supple. No tracheal deviation present. No thyromegaly present.  Cardiovascular: Normal rate and regular rhythm.   No murmur heard. Pulmonary/Chest: Effort normal and breath sounds normal.  Abdominal: Soft. Bowel sounds are normal. She exhibits no distension. There is tenderness.  Right lower quadrant tenderness  Genitourinary: Vaginal discharge found.  No external lesion. Slight amount of yellowish vaginal discharge. Right adnexal tenderness. Cervix not present  Musculoskeletal: Normal range of motion. She exhibits no edema or tenderness.  Neurological: She is alert and oriented to person, place, and time. No cranial nerve deficit. Coordination normal.  Mild resting tremor gait normal, walks unassisted moves all extremities  well  Skin: Skin is warm and dry. No rash noted.  Psychiatric: She has a normal mood and affect.  Nursing note and vitals reviewed.   ED Course  Procedures (including critical care time) Labs Review Labs Reviewed - No data to display  Imaging Review No results found.   EKG Interpretation   Date/Time:  Monday August 25 2014 11:01:23 EDT Ventricular Rate:  82 PR Interval:  122 QRS Duration: 73 QT Interval:  370 QTC Calculation: 432 R Axis:   60 Text Interpretation:  Sinus rhythm Probable anteroseptal infarct, old  Baseline wander in lead(s) V4 No significant change since last tracing  Confirmed by Corleen Otwell  MD, Nakeya Adinolfi [phone]) on 08/25/2014 11:13:57 AM      12:45 PM pain improved after treatment with intravenous morphine. She reports pain under control.  3:30 PM patient resting comfortably. No tremor vaginal discharge is nonspecific. Results for orders placed or performed during the hospital encounter of 08/25/14  Wet prep, genital  Result Value Ref Range   Yeast Wet Prep HPF POC NONE SEEN NONE SEEN   Trich, Wet Prep NONE SEEN NONE SEEN   Clue Cells Wet Prep HPF POC NONE SEEN NONE SEEN   WBC, Wet Prep HPF POC MODERATE (A) NONE SEEN  Urinalysis, Routine w reflex microscopic (not at [facility])  Result Value Ref Range   Color, Urine YELLOW YELLOW   APPearance CLEAR CLEAR   Specific Gravity, Urine 1.005 1.005 - 1.030   pH 6.0 5.0 - 8.0   Glucose, UA NEGATIVE NEGATIVE mg/dL   Hgb urine dipstick NEGATIVE NEGATIVE   Bilirubin Urine NEGATIVE NEGATIVE   Ketones, ur NEGATIVE NEGATIVE mg/dL   Protein, ur NEGATIVE NEGATIVE mg/dL   Urobilinogen, UA 0.2 0.0 - 1.0 mg/dL   Nitrite NEGATIVE NEGATIVE   Leukocytes, UA TRACE (A) NEGATIVE  Comprehensive metabolic panel  Result Value Ref Range   Sodium 139 135 - 145 mmol/L   Potassium 4.2 3.5 - 5.1 mmol/L   Chloride 103 101 - 111 mmol/L   CO2 26 22 - 32 mmol/L   Glucose, Bld 156 (H) 65 - 99 mg/dL   BUN 12 6 - 20 mg/dL   Creatinine, Ser  0.83  0.44 - 1.00 mg/dL   Calcium 9.6 8.9 - 10.3 mg/dL   Total Protein 6.2 (L) 6.5 - 8.1 g/dL   Albumin 3.8 3.5 - 5.0 g/dL   AST 18 15 - 41 U/L   ALT 13 (L) 14 - 54 U/L   Alkaline Phosphatase 89 38 - 126 U/L   Total Bilirubin 0.7 0.3 - 1.2 mg/dL   GFR calc non Af Amer >60 >60 mL/min   GFR calc Af Amer >60 >60 mL/min   Anion gap 10 5 - 15  CBC with Differential/Platelet  Result Value Ref Range   WBC 8.1 4.0 - 10.5 K/uL   RBC 4.88 3.87 - 5.11 MIL/uL   Hemoglobin 15.4 (H) 12.0 - 15.0 g/dL   HCT 44.4 36.0 - 46.0 %   MCV 91.0 78.0 - 100.0 fL   MCH 31.6 26.0 - 34.0 pg   MCHC 34.7 30.0 - 36.0 g/dL   RDW 13.1 11.5 - 15.5 %   Platelets 270 150 - 400 K/uL   Neutrophils Relative % 59 43 - 77 %   Neutro Abs 4.8 1.7 - 7.7 K/uL   Lymphocytes Relative 29 12 - 46 %   Lymphs Abs 2.4 0.7 - 4.0 K/uL   Monocytes Relative 9 3 - 12 %   Monocytes Absolute 0.7 0.1 - 1.0 K/uL   Eosinophils Relative 3 0 - 5 %   Eosinophils Absolute 0.2 0.0 - 0.7 K/uL   Basophils Relative 0 0 - 1 %   Basophils Absolute 0.0 0.0 - 0.1 K/uL  Lipase, blood  Result Value Ref Range   Lipase 27 22 - 51 U/L  Urine microscopic-add on  Result Value Ref Range   Squamous Epithelial / LPF FEW (A) RARE   WBC, UA 0-2 <3 WBC/hpf   RBC / HPF 0-2 <3 RBC/hpf   Mr Jeri Cos Wo Contrast  08/14/2014   CLINICAL DATA:  79 year old diabetic hypertensive female with gait instability, hyper reflexia and tremor for 2 years becoming worse. History of lung cancer. Subsequent encounter.  EXAM: MRI HEAD WITHOUT AND WITH CONTRAST  TECHNIQUE: Multiplanar, multiecho pulse sequences of the brain and surrounding structures were obtained without and with intravenous contrast.  CONTRAST:  42m MULTIHANCE GADOBENATE DIMEGLUMINE 529 MG/ML IV SOLN  COMPARISON:  01/27/2012 MR.  FINDINGS: No acute infarct.  No intracranial enhancing lesion or bony destructive lesion to suggest the presence of intracranial metastatic disease.  Mild small vessel disease type changes.  No  intracranial hemorrhage.  No evidence of age advanced atrophy or hydrocephalus.  Pars compacta visualized.  Major intracranial vascular structures are patent.  Post lens replacement otherwise orbital structures unremarkable.  Mild narrowing upper cervical spine. Cervical medullary junction, pituitary region and pineal region unremarkable.  IMPRESSION: No acute infarct.  No intracranial enhancing lesion or bony destructive lesion to suggest the presence of intracranial metastatic disease.  Mild small vessel disease type changes.  No evidence of age advanced atrophy or hydrocephalus.   Electronically Signed   By: SGenia DelM.D.   On: 08/14/2014 11:16   Ct Abdomen Pelvis W Contrast  08/25/2014   CLINICAL DATA:  79year old with worsening right lower quadrant pain. History of hysterectomy and appendectomy.  EXAM: CT ABDOMEN AND PELVIS WITH CONTRAST  TECHNIQUE: Multidetector CT imaging of the abdomen and pelvis was performed using the standard protocol following bolus administration of intravenous contrast.  CONTRAST:  1064mOMNIPAQUE IOHEXOL 300 MG/ML  SOLN  COMPARISON:  06/20/2014  FINDINGS: Stable peripheral  fibrosis in the left lower lobe. Negative for free intraperitoneal air.  Gallbladder has been removed. Normal appearance of liver and the portal venous system is patent. Normal appearance of the spleen, pancreas and adrenal glands. Stable posterior right renal cysts. Mild fullness of the right renal pelvis is unchanged. Mild distention of the urinary bladder. Stable 3 mm stone in the left kidney without hydronephrosis. There is a small extrarenal pelvis on the left side. Small cysts in the left kidney interpolar region.  Normal appearance of stomach and duodenum. No significant small bowel dilatation. Uterus has been removed. Diverticulosis involving the sigmoid colon without acute inflammation. Decreased distension of the cecum compared to the previous examination. Normal appearance of the terminal ileum.  No significant free fluid or lymphadenopathy.  The aorta and iliac arteries are heavily calcified without aneurysm. No acute bone abnormality.  IMPRESSION: No acute abnormalities in the abdomen or pelvis.  Bilateral renal cysts.   Electronically Signed   By: Markus Daft M.D.   On: 08/25/2014 14:15   Mm Digital Screening Bilateral  08/04/2014   CLINICAL DATA:  Screening.  EXAM: DIGITAL SCREENING BILATERAL MAMMOGRAM WITH CAD  COMPARISON:  Previous exam(s).  ACR Breast Density Category b: There are scattered areas of fibroglandular density.  FINDINGS: There are no findings suspicious for malignancy. Images were processed with CAD.  IMPRESSION: No mammographic evidence of malignancy. A result letter of this screening mammogram will be mailed directly to the patient.  RECOMMENDATION: Screening mammogram in one year. (Code:SM-B-01Y)  BI-RADS CATEGORY  1: Negative.   Electronically Signed   By: Conchita Paris M.D.   On: 08/04/2014 16:43    MDM  A short reports that she cries every night. I feel the patient is depressed. Her daughters are in agreement. I suggest Tylenol for pain. That she should follow up with her primary care physician, Dr Deborra Medina regarding evaluation by psychiatrist and possibly pain management clinic. Final diagnoses:  None   Diagnoses #1 chronic abdominal pain #2 tremor #3 hyperglycemia #4vaginal discharge     Orlie Dakin, MD 08/25/14 1540

## 2014-08-25 NOTE — Discharge Instructions (Signed)
Take Tylenol as directed for pain. Call Dr.Aron's office tomorrow to schedule an appointment. We think that you may benefit from counseling with the psychologist or psychiatrist for depression. You may also benefit from a pain management clinic

## 2014-08-25 NOTE — ED Notes (Addendum)
Pt from home with c/o worsening tremors x 1 week, family reports increasing confusion x 3 weeks, and RLQ pain worsening x "a few months."  Please see medical hx for past dx and surgical procedures, pt has spent 15 minutes telling this RN "her story."  Pt adds more complaints as the conversation continues, including; a dull headache, "no energy," a fast HR as high as 447, diastolic BP as low as 50, large recent weight loss, etc.  Pt NAD.

## 2014-08-26 LAB — GC/CHLAMYDIA PROBE AMP (~~LOC~~) NOT AT ARMC
Chlamydia: NEGATIVE
Neisseria Gonorrhea: NEGATIVE

## 2014-08-26 LAB — HIV ANTIBODY (ROUTINE TESTING W REFLEX): HIV Screen 4th Generation wRfx: NONREACTIVE

## 2014-08-26 LAB — RPR: RPR: NONREACTIVE

## 2014-09-11 ENCOUNTER — Emergency Department (HOSPITAL_COMMUNITY): Payer: PPO

## 2014-09-11 ENCOUNTER — Ambulatory Visit: Payer: PPO | Admitting: Family Medicine

## 2014-09-11 ENCOUNTER — Encounter (HOSPITAL_COMMUNITY): Payer: Self-pay | Admitting: Emergency Medicine

## 2014-09-11 ENCOUNTER — Emergency Department (HOSPITAL_COMMUNITY)
Admission: EM | Admit: 2014-09-11 | Discharge: 2014-09-11 | Disposition: A | Discharge status: Home / Self Care | Payer: PPO | Attending: Emergency Medicine | Admitting: Emergency Medicine

## 2014-09-11 DIAGNOSIS — J441 Chronic obstructive pulmonary disease with (acute) exacerbation: Secondary | ICD-10-CM | POA: Diagnosis not present

## 2014-09-11 DIAGNOSIS — Z79899 Other long term (current) drug therapy: Secondary | ICD-10-CM | POA: Insufficient documentation

## 2014-09-11 DIAGNOSIS — K219 Gastro-esophageal reflux disease without esophagitis: Secondary | ICD-10-CM | POA: Insufficient documentation

## 2014-09-11 DIAGNOSIS — M1990 Unspecified osteoarthritis, unspecified site: Secondary | ICD-10-CM | POA: Insufficient documentation

## 2014-09-11 DIAGNOSIS — Z8619 Personal history of other infectious and parasitic diseases: Secondary | ICD-10-CM | POA: Diagnosis not present

## 2014-09-11 DIAGNOSIS — Z853 Personal history of malignant neoplasm of breast: Secondary | ICD-10-CM | POA: Diagnosis not present

## 2014-09-11 DIAGNOSIS — Z794 Long term (current) use of insulin: Secondary | ICD-10-CM | POA: Diagnosis not present

## 2014-09-11 DIAGNOSIS — Z87442 Personal history of urinary calculi: Secondary | ICD-10-CM | POA: Diagnosis not present

## 2014-09-11 DIAGNOSIS — R6883 Chills (without fever): Secondary | ICD-10-CM | POA: Diagnosis not present

## 2014-09-11 DIAGNOSIS — I509 Heart failure, unspecified: Secondary | ICD-10-CM | POA: Diagnosis not present

## 2014-09-11 DIAGNOSIS — F419 Anxiety disorder, unspecified: Secondary | ICD-10-CM | POA: Diagnosis not present

## 2014-09-11 DIAGNOSIS — E119 Type 2 diabetes mellitus without complications: Secondary | ICD-10-CM | POA: Diagnosis not present

## 2014-09-11 DIAGNOSIS — Z720 Tobacco use: Secondary | ICD-10-CM | POA: Insufficient documentation

## 2014-09-11 DIAGNOSIS — Z87440 Personal history of urinary (tract) infections: Secondary | ICD-10-CM | POA: Diagnosis not present

## 2014-09-11 DIAGNOSIS — R000 Tachycardia, unspecified: Secondary | ICD-10-CM | POA: Diagnosis not present

## 2014-09-11 DIAGNOSIS — Z862 Personal history of diseases of the blood and blood-forming organs and certain disorders involving the immune mechanism: Secondary | ICD-10-CM | POA: Diagnosis not present

## 2014-09-11 DIAGNOSIS — R61 Generalized hyperhidrosis: Secondary | ICD-10-CM | POA: Insufficient documentation

## 2014-09-11 DIAGNOSIS — I10 Essential (primary) hypertension: Secondary | ICD-10-CM | POA: Diagnosis not present

## 2014-09-11 DIAGNOSIS — Z7982 Long term (current) use of aspirin: Secondary | ICD-10-CM | POA: Diagnosis not present

## 2014-09-11 DIAGNOSIS — R002 Palpitations: Secondary | ICD-10-CM | POA: Diagnosis present

## 2014-09-11 DIAGNOSIS — Z85118 Personal history of other malignant neoplasm of bronchus and lung: Secondary | ICD-10-CM | POA: Diagnosis not present

## 2014-09-11 DIAGNOSIS — F329 Major depressive disorder, single episode, unspecified: Secondary | ICD-10-CM | POA: Diagnosis not present

## 2014-09-11 LAB — CBC WITH DIFFERENTIAL/PLATELET
BASOS PCT: 0 % (ref 0–1)
Basophils Absolute: 0 10*3/uL (ref 0.0–0.1)
EOS ABS: 0.2 10*3/uL (ref 0.0–0.7)
Eosinophils Relative: 2 % (ref 0–5)
HEMATOCRIT: 44.4 % (ref 36.0–46.0)
HEMOGLOBIN: 15.3 g/dL — AB (ref 12.0–15.0)
Lymphocytes Relative: 28 % (ref 12–46)
Lymphs Abs: 2.5 10*3/uL (ref 0.7–4.0)
MCH: 31.4 pg (ref 26.0–34.0)
MCHC: 34.5 g/dL (ref 30.0–36.0)
MCV: 91.2 fL (ref 78.0–100.0)
MONO ABS: 0.8 10*3/uL (ref 0.1–1.0)
Monocytes Relative: 10 % (ref 3–12)
NEUTROS PCT: 60 % (ref 43–77)
Neutro Abs: 5.2 10*3/uL (ref 1.7–7.7)
PLATELETS: 282 10*3/uL (ref 150–400)
RBC: 4.87 MIL/uL (ref 3.87–5.11)
RDW: 13.1 % (ref 11.5–15.5)
WBC: 8.7 10*3/uL (ref 4.0–10.5)

## 2014-09-11 LAB — COMPREHENSIVE METABOLIC PANEL
ALT: 13 U/L — ABNORMAL LOW (ref 14–54)
AST: 19 U/L (ref 15–41)
Albumin: 4.3 g/dL (ref 3.5–5.0)
Alkaline Phosphatase: 95 U/L (ref 38–126)
Anion gap: 13 (ref 5–15)
BUN: 14 mg/dL (ref 6–20)
CO2: 23 mmol/L (ref 22–32)
CREATININE: 0.87 mg/dL (ref 0.44–1.00)
Calcium: 9.7 mg/dL (ref 8.9–10.3)
Chloride: 100 mmol/L — ABNORMAL LOW (ref 101–111)
Glucose, Bld: 238 mg/dL — ABNORMAL HIGH (ref 65–99)
Potassium: 3.8 mmol/L (ref 3.5–5.1)
Sodium: 136 mmol/L (ref 135–145)
TOTAL PROTEIN: 7.3 g/dL (ref 6.5–8.1)
Total Bilirubin: 1 mg/dL (ref 0.3–1.2)

## 2014-09-11 LAB — I-STAT TROPONIN, ED: Troponin i, poc: 0 ng/mL (ref 0.00–0.08)

## 2014-09-11 MED ORDER — ACETAMINOPHEN 325 MG PO TABS
650.0000 mg | ORAL_TABLET | Freq: Once | ORAL | Status: AC
  Administered 2014-09-11: 650 mg via ORAL
  Filled 2014-09-11: qty 2

## 2014-09-11 NOTE — ED Notes (Signed)
MD Ray at bedside updating patient and family.

## 2014-09-11 NOTE — Discharge Instructions (Signed)
Your heart rate is slightly fast but otherwise normal here. Your blood count is normal. Your electrolytes are normal. Please continue taking your metoprolol as previously prescribed. Call your physician to schedule a follow-up appointment.

## 2014-09-11 NOTE — ED Provider Notes (Addendum)
CSN: [id number]     Arrival date & time 09/11/14  0907 History  This chart was scribed for Pattricia Boss, MD by Hilda Lias, ED Scribe. This patient was seen in room APA06/APA06 and the patient's care was started at 9:46 AM.    Chief Complaint  Patient presents with  . Palpitations     Patient is a 79 y.o. female presenting with palpitations. The history is provided by the patient. No language interpreter was used.  Palpitations Palpitations quality:  Regular Onset quality:  Sudden Duration:  3 hours Timing:  Constant Progression:  Improving Chronicity:  New Relieved by:  None tried Ineffective treatments:  None tried Associated symptoms: diaphoresis   Associated symptoms: no chest pain and no shortness of breath   Risk factors: diabetes mellitus      HPI Comments: Marie Holmes is a 79 y.o. female with DM, HTN, who presents to the Emergency Department complaining of constant heart palpitations with associated chills, diaphoresis that has been present since this morning when pt woke up. She states she woke up and checked her pulse, blood pressure, and oxygen level and found that her  Heart rate was 130 but notes that she usually has a HR around 70-80. Pt states that she does this every day because of her hx as a diabetic and hx of hypertension. Pt reports that she usually takes metaprolol for her blood pressure, but notes that she stopped taking it a week ago because she thought her blood pressure got too low. Pt states her bp was roughly 110/55 at that time. Pt reports that she took metaprolol today again for the first time in a week, and since taking it her HR has dropped from 130-115. Pt denies chest pain, SOB, fever, denies a hx of blood clots, hx of irregular heart beats in the past.      Past Medical History  Diagnosis Date  . Benign tumor of breast 2004    left  . Broken ribs     history of several broken ribs on right and compression  fx  . Fibroma of skin     Multiple  fibromas/lipomas on arms and legs B  . Anxiety   . COPD (chronic obstructive pulmonary disease)   . Hyperlipidemia   . History of CT scan of abdomen 05/23/2003    Mild fatty liver inf, tiny hypodensity in right lobe of liver  . Pulmonary nodules   . Diabetes mellitus without complication   . Hx of radiation therapy 04/11/12- 05/08/12    left chest wall area, 40 gray 20 fx  . Hypertension   . Diverticulosis   . Adenomatous colon polyp   . Cancer     non-hodgkins b cell lymphoma  . 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 Depression   . Pyelonephritis   . Kidney stone 02/21/1981    Right/removed  . GERD (gastroesophageal reflux disease)   . Osteoarthritis     hands, degenerative back , osteoporosis   . Cold virus     seen by Dr. Deborra Medina on 10/12, given Rx for antibiotic & steroid  . CHF (congestive heart failure)     hosp. for CHF- 10/2013  . Shortness of breath     only with movemnent   . C. difficile colitis 06/21/2014  . UTI (lower urinary tract infection)    Past Surgical History  Procedure Laterality Date  . Appendectomy  02/21/1961  . Exploratory laparotomy  02/21/1970    for ? adhesions  . Abdominal hysterectomy  02/21/1970    right ovary remaining  . Bone marrow biopsy  03/09/2012  . Lung core biopsy  02/06/2012  . Tonsillectomy    . Breast surgery  1970's    lump removed- benign   . Tubal ligation    . Eye surgery      bilateral cataracts removed - /W- IOL  . Cholecystectomy N/A 12/11/2013    Procedure: LAPAROSCOPIC CHOLECYSTECTOMY WITH INTRAOPERATIVE CHOLANGIOGRAM;  Surgeon: Erroll Luna, MD;  Location: [facility];  Service: General;  Laterality: N/A;   Family History  Problem Relation Age of Onset  . Parkinsonism Brother   . Stroke Father   . Cancer Sister   . Heart disease Brother   . Clotting disorder Father   . Kidney disease Mother    History  Substance Use Topics  . Smoking status: Current Every Day Smoker  -- 0.50 packs/day for 30 years    Types: Cigarettes  . Smokeless tobacco: Never Used     Comment: 3/4 pack X 50 years  . Alcohol Use: No   OB History    No data available     Review of Systems  Constitutional: Positive for chills and diaphoresis. Negative for fever.  Respiratory: Negative for shortness of breath.   Cardiovascular: Positive for palpitations. Negative for chest pain.  All other systems reviewed and are negative.     Allergies  Sulfonamide derivatives and Metformin and related  Home Medications   Prior to Admission medications   Medication Sig Start Date End Date Taking? Authorizing Provider  albuterol (PROVENTIL HFA;VENTOLIN HFA) 108 (90 BASE) MCG/ACT inhaler Inhale 2 puffs into the lungs every 6 (six) hours as needed for wheezing or shortness of breath.    Historical Provider, MD  ALPRAZolam Duanne Moron) 0.5 MG tablet Take 1 tablet (0.5 mg total) by mouth 3 (three) times daily as needed for anxiety. 07/15/14   Lucille Passy, MD  aspirin EC 81 MG tablet Take 81 mg by mouth every morning.    Historical Provider, MD  Blood Glucose Monitoring Suppl (ONE TOUCH BASIC SYSTEM) W/DEVICE KIT Use as directed to test blood sugar three times daily Dx:R73.09 05/01/14   Lucille Passy, MD  citalopram (CELEXA) 40 MG tablet Take 1 tablet (40 mg total) by mouth daily. 02/27/14   Lucille Passy, MD  furosemide (LASIX) 40 MG tablet Take 1 tablet (40 mg total) by mouth daily as needed (swelling, shortness of breath). 11/02/13   Kathie Dike, MD  glucose blood test strip Use as instructed 05/01/14   Lucille Passy, MD  HYDROcodone-acetaminophen (VICODIN) 5-500 MG per tablet Take 1 tablet by mouth every 8 (eight) hours as needed for pain. 07/10/14   Pleas Koch, NP  Insulin Pen Needle 32G X 4 MM MISC Use to inject once daily. 06/17/14   Philemon Kingdom, MD  Liraglutide 18 MG/3ML SOPN Inject 0.6 mg daily x 1 week, then increase to 1.2 mg daily. Patient taking differently: Inject 0.6 mg into the skin  2 (two) times daily.  06/17/14   Philemon Kingdom, MD  losartan (COZAAR) 50 MG tablet Take 1/2 tablet daily for 4 days and then increase it to one whole tablet daily as previously prescribed. 07/22/14   Lucille Passy, MD  metoprolol succinate (TOPROL-XL) 100 MG 24 hr tablet Take 1 tablet (100 mg total) by mouth every morning. 06/26/14   Lucille Passy, MD  omeprazole (PRILOSEC) 20 MG  capsule Take 20 mg by mouth daily.    Historical Provider, MD  ondansetron (ZOFRAN ODT) 4 MG disintegrating tablet Take 1 tablet (4 mg total) by mouth every 8 (eight) hours as needed for nausea or vomiting. 07/10/14   Pleas Koch, NP  ONE [facility] LANCETS MISC Use as directed to test blood sugar three times daily Dx:R73.09 05/01/14   Lucille Passy, MD  Probiotic Product (ALIGN PO) Take by mouth.    Historical Provider, MD  vitamin B-12 (CYANOCOBALAMIN) 500 MCG tablet Take 500 mcg by mouth daily.    Historical Provider, MD   There were no vitals taken for this visit. Physical Exam  Constitutional: She is oriented to person, place, and time. She appears well-developed and well-nourished.  Thin  HENT:  Head: Normocephalic and atraumatic.  Right Ear: External ear normal.  Left Ear: External ear normal.  Nose: Nose normal.  Mouth/Throat: Oropharynx is clear and moist.  Eyes: Conjunctivae and EOM are normal. Pupils are equal, round, and reactive to light.  Neck: Normal range of motion. Neck supple. No JVD present. No tracheal deviation present. No thyromegaly present.  Cardiovascular: Normal rate, regular rhythm, normal heart sounds and intact distal pulses.   Pulmonary/Chest: Effort normal. She has no wheezes.  Decreased breath sounds at bases. No wheezing or rales.  Abdominal: Soft. Bowel sounds are normal. She exhibits no mass. There is no tenderness. There is no guarding.  Musculoskeletal: Normal range of motion.  Lymphadenopathy:    She has no cervical adenopathy.  Neurological: She is alert and oriented to person,  place, and time. She has normal reflexes. No cranial nerve deficit or sensory deficit. Gait normal. GCS eye subscore is 4. GCS verbal subscore is 5. GCS motor subscore is 6.  Reflex Scores:      Bicep reflexes are 2+ on the right side and 2+ on the left side.      Patellar reflexes are 2+ on the right side and 2+ on the left side. Strength is normal and equal throughout. Cranial nerves grossly intact. Patient fluent. No gross ataxia and patient able to ambulate without difficulty.  Skin: Skin is warm and dry.  Psychiatric: She has a normal mood and affect. Her behavior is normal. Judgment and thought content normal.  Nursing note and vitals reviewed.   ED Course  Procedures (including critical care time)  DIAGNOSTIC STUDIES: Oxygen Saturation is 95% on room air, normal by my interpretation.    COORDINATION OF CARE: 9:54 AM Discussed treatment plan with pt at bedside and pt agreed to plan.   Labs Review Labs Reviewed  COMPREHENSIVE METABOLIC PANEL - Abnormal; Notable for the following:    Chloride 100 (*)    Glucose, Bld 238 (*)    ALT 13 (*)    All other components within normal limits  CBC WITH DIFFERENTIAL/PLATELET - Abnormal; Notable for the following:    Hemoglobin 15.3 (*)    All other components within normal limits  I-STAT TROPOININ, ED    Imaging Review No results found.   EKG Interpretation   Date/Time:  Thursday September 11 2014 09:22:41 EDT Ventricular Rate:  108 PR Interval:  151 QRS Duration: 81 QT Interval:  346 QTC Calculation: 464 R Axis:   77 Text Interpretation:  Sinus tachycardia Anteroseptal infarct, old SINCE  LAST TRACING HEART RATE HAS INCREASED Confirmed by Loki Wuthrich MD, Andee Poles  [id number]) on 09/11/2014 10:46:46 AM      MDM   Final diagnoses:  Tachycardia   Marie Holmes  is a 79 year old lady who comes in today because she has noted that her heart rate has been high in the mornings.  She stopped taking her beta blocker one week ago, but states this  preceded stopping.  She did take her metoprolol this am after noting her hr was 130.  She denies other complaints and work up here is normal.  She may have had an episode of afib but here is in a normal sinus rhythm- patient is in sinus tachycardia here and heart rate is coming down the last noted heart rate being in the 90s. I suspect that she has some tachycardia due to her tobacco use (she had smoked several cigarettes this morning), stopping her beta blocker, and her multiple comorbidities including COPD. She does not have any evidence of dyspnea, swelling or pain in her legs, making DVT and PE very low probability. I have checked her basic labs and find any anemia or severe electrolyte abnormalities she is advised to call with her primary care physician and return if she has any worsening of symptoms. Patient is diabetic and she is hyperglycemic here at the blood sugar 238. No evidence of DKA is noted. I personally performed the services described in this documentation, which was scribed in my presence. The recorded information has been reviewed and considered.   Pattricia Boss, MD 09/11/14 1102  Pattricia Boss, MD 09/11/14 [phone]

## 2014-09-11 NOTE — ED Notes (Addendum)
Pt from home c/o of tachycardia x 1 week. Pt states she monitors HR and BP at home.  Pt states she woke up this morning and HR was 130. Pt diaphoretic. Denies hx of afib. Denies CP. Denies SOB. Denies N/V. Denies dizziness. Pt took 81 ASA PTA.

## 2014-09-15 ENCOUNTER — Ambulatory Visit (INDEPENDENT_AMBULATORY_CARE_PROVIDER_SITE_OTHER): Payer: PPO | Admitting: Family Medicine

## 2014-09-15 ENCOUNTER — Encounter: Payer: Self-pay | Admitting: Family Medicine

## 2014-09-15 ENCOUNTER — Ambulatory Visit: Payer: PPO | Admitting: Family Medicine

## 2014-09-15 VITALS — BP 118/60 | HR 89 | Temp 97.7°F | Wt 114.2 lb

## 2014-09-15 DIAGNOSIS — E119 Type 2 diabetes mellitus without complications: Secondary | ICD-10-CM | POA: Insufficient documentation

## 2014-09-15 DIAGNOSIS — E1159 Type 2 diabetes mellitus with other circulatory complications: Secondary | ICD-10-CM

## 2014-09-15 DIAGNOSIS — R300 Dysuria: Secondary | ICD-10-CM | POA: Diagnosis not present

## 2014-09-15 DIAGNOSIS — R002 Palpitations: Secondary | ICD-10-CM | POA: Insufficient documentation

## 2014-09-15 DIAGNOSIS — R350 Frequency of micturition: Secondary | ICD-10-CM | POA: Diagnosis not present

## 2014-09-15 DIAGNOSIS — R251 Tremor, unspecified: Secondary | ICD-10-CM

## 2014-09-15 LAB — COMPREHENSIVE METABOLIC PANEL
ALT: 13 U/L (ref 0–35)
AST: 14 U/L (ref 0–37)
Albumin: 4.2 g/dL (ref 3.5–5.2)
Alkaline Phosphatase: 83 U/L (ref 39–117)
BUN: 12 mg/dL (ref 6–23)
CO2: 26 meq/L (ref 19–32)
Calcium: 10 mg/dL (ref 8.4–10.5)
Chloride: 103 mEq/L (ref 96–112)
Creatinine, Ser: 0.89 mg/dL (ref 0.40–1.20)
GFR: 65.3 mL/min (ref >60.00)
Glucose, Bld: 135 mg/dL — ABNORMAL HIGH (ref 70–99)
POTASSIUM: 4.9 meq/L (ref 3.5–5.1)
SODIUM: 137 meq/L (ref 135–145)
Total Bilirubin: 0.5 mg/dL (ref 0.2–1.2)
Total Protein: 6.7 g/dL (ref 6.0–8.3)

## 2014-09-15 LAB — HEMOGLOBIN A1C: Hgb A1c MFr Bld: 7 % — ABNORMAL HIGH (ref 4.6–6.5)

## 2014-09-15 LAB — POCT URINALYSIS DIPSTICK
BILIRUBIN UA: NEGATIVE
Glucose, UA: NEGATIVE
KETONES UA: NEGATIVE
Leukocytes, UA: NEGATIVE
NITRITE UA: NEGATIVE
PROTEIN UA: NEGATIVE
RBC UA: NEGATIVE
Spec Grav, UA: 1.01
Urobilinogen, UA: 0.2
pH, UA: 6

## 2014-09-15 MED ORDER — METOPROLOL SUCCINATE ER 50 MG PO TB24: 50.0000 mg | ORAL_TABLET | Freq: Every day | ORAL | Status: DC

## 2014-09-15 NOTE — Progress Notes (Signed)
Pre visit review using our clinic review tool, if applicable. No additional management support is needed unless otherwise documented below in the visit note. 

## 2014-09-15 NOTE — Patient Instructions (Addendum)
Good to see you. I have sent in a new prescription for Toprol 50 mg XL daily to your pharmacy.  I will call you with your lab results.

## 2014-09-15 NOTE — Assessment & Plan Note (Signed)
Intermittent. Currently asymptomatic and UA neg. Call or return to clinic prn if these symptoms worsen or fail to improve as anticipated. The patient indicates understanding of these issues and agrees with the plan.

## 2014-09-15 NOTE — Assessment & Plan Note (Signed)
Unchanged Neg brain imaging.
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
## 2014-09-15 NOTE — Assessment & Plan Note (Signed)
Due for repeat a1c. Has not been seeing endocrinology. On ARB.

## 2014-09-15 NOTE — Assessment & Plan Note (Signed)
Resolved now that she restarted Toprol. eRx sent for lower dose Toprol - 50 mg XL daily. Call or return to clinic prn if these symptoms worsen or fail to improve as anticipated. The patient indicates understanding of these issues and agrees with the plan.

## 2014-10-01 ENCOUNTER — Other Ambulatory Visit: Payer: Self-pay | Admitting: Internal Medicine

## 2014-10-09 ENCOUNTER — Other Ambulatory Visit: Payer: Self-pay | Admitting: *Deleted

## 2014-10-09 NOTE — Telephone Encounter (Signed)
Last f/u appt 06/2014

## 2014-10-10 MED ORDER — ALPRAZOLAM 0.5 MG PO TABS: 0.5000 mg | ORAL_TABLET | Freq: Three times a day (TID) | ORAL | Status: DC | PRN

## 2014-10-10 NOTE — Telephone Encounter (Signed)
Rx called in to requested pharmacy 

## 2014-10-20 ENCOUNTER — Ambulatory Visit (INDEPENDENT_AMBULATORY_CARE_PROVIDER_SITE_OTHER): Payer: PPO | Admitting: Family Medicine

## 2014-10-20 ENCOUNTER — Encounter: Payer: Self-pay | Admitting: Family Medicine

## 2014-10-20 VITALS — BP 110/58 | HR 87 | Temp 98.0°F | Wt 111.2 lb

## 2014-10-20 DIAGNOSIS — J441 Chronic obstructive pulmonary disease with (acute) exacerbation: Secondary | ICD-10-CM | POA: Diagnosis not present

## 2014-10-20 MED ORDER — PREDNISONE 20 MG PO TABS: ORAL_TABLET | ORAL | Status: DC

## 2014-10-20 MED ORDER — DOXYCYCLINE HYCLATE 100 MG PO TABS: 100.0000 mg | ORAL_TABLET | Freq: Two times a day (BID) | ORAL | Status: DC

## 2014-10-20 MED ORDER — DEXAMETHASONE SODIUM PHOSPHATE 10 MG/ML IJ SOLN
10.0000 mg | Freq: Once | INTRAMUSCULAR | Status: AC
  Administered 2014-10-20: 10 mg via INTRAMUSCULAR

## 2014-10-20 NOTE — Assessment & Plan Note (Signed)
New- IM decadron given in office. eRx for Doxy BID x 10 days eRx for pred taper to start in 2 days.   Call or return to clinic prn if these symptoms worsen or fail to improve as anticipated. The patient indicates understanding of these issues and agrees with the plan.

## 2014-10-20 NOTE — Addendum Note (Signed)
Addended by: Modena Nunnery on: 10/20/2014 03:13 PM   Modules accepted: Orders
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
## 2014-10-20 NOTE — Progress Notes (Signed)
Pre visit review using our clinic review tool, if applicable. No additional management support is needed unless otherwise documented below in the visit note. 

## 2014-10-25 ENCOUNTER — Encounter (HOSPITAL_COMMUNITY): Payer: Self-pay | Admitting: Emergency Medicine

## 2014-10-25 ENCOUNTER — Emergency Department (HOSPITAL_COMMUNITY)
Admission: EM | Admit: 2014-10-25 | Discharge: 2014-10-25 | Disposition: A | Discharge status: Home / Self Care | Payer: PPO | Attending: Emergency Medicine | Admitting: Emergency Medicine

## 2014-10-25 DIAGNOSIS — E1165 Type 2 diabetes mellitus with hyperglycemia: Secondary | ICD-10-CM | POA: Insufficient documentation

## 2014-10-25 DIAGNOSIS — Z794 Long term (current) use of insulin: Secondary | ICD-10-CM | POA: Diagnosis not present

## 2014-10-25 DIAGNOSIS — Z792 Long term (current) use of antibiotics: Secondary | ICD-10-CM | POA: Diagnosis not present

## 2014-10-25 DIAGNOSIS — Z8572 Personal history of non-Hodgkin lymphomas: Secondary | ICD-10-CM | POA: Insufficient documentation

## 2014-10-25 DIAGNOSIS — Z720 Tobacco use: Secondary | ICD-10-CM | POA: Insufficient documentation

## 2014-10-25 DIAGNOSIS — Z8619 Personal history of other infectious and parasitic diseases: Secondary | ICD-10-CM | POA: Insufficient documentation

## 2014-10-25 DIAGNOSIS — Z923 Personal history of irradiation: Secondary | ICD-10-CM | POA: Insufficient documentation

## 2014-10-25 DIAGNOSIS — R739 Hyperglycemia, unspecified: Secondary | ICD-10-CM

## 2014-10-25 DIAGNOSIS — Z85118 Personal history of other malignant neoplasm of bronchus and lung: Secondary | ICD-10-CM | POA: Diagnosis not present

## 2014-10-25 DIAGNOSIS — G8929 Other chronic pain: Secondary | ICD-10-CM | POA: Diagnosis not present

## 2014-10-25 DIAGNOSIS — Z86010 Personal history of colonic polyps: Secondary | ICD-10-CM | POA: Diagnosis not present

## 2014-10-25 DIAGNOSIS — Z853 Personal history of malignant neoplasm of breast: Secondary | ICD-10-CM | POA: Insufficient documentation

## 2014-10-25 DIAGNOSIS — Z8719 Personal history of other diseases of the digestive system: Secondary | ICD-10-CM | POA: Insufficient documentation

## 2014-10-25 DIAGNOSIS — Z87440 Personal history of urinary (tract) infections: Secondary | ICD-10-CM | POA: Insufficient documentation

## 2014-10-25 DIAGNOSIS — Z87442 Personal history of urinary calculi: Secondary | ICD-10-CM | POA: Insufficient documentation

## 2014-10-25 DIAGNOSIS — I10 Essential (primary) hypertension: Secondary | ICD-10-CM | POA: Insufficient documentation

## 2014-10-25 DIAGNOSIS — Z79899 Other long term (current) drug therapy: Secondary | ICD-10-CM | POA: Insufficient documentation

## 2014-10-25 DIAGNOSIS — Z7982 Long term (current) use of aspirin: Secondary | ICD-10-CM | POA: Insufficient documentation

## 2014-10-25 DIAGNOSIS — J449 Chronic obstructive pulmonary disease, unspecified: Secondary | ICD-10-CM | POA: Diagnosis not present

## 2014-10-25 DIAGNOSIS — I509 Heart failure, unspecified: Secondary | ICD-10-CM | POA: Diagnosis not present

## 2014-10-25 HISTORY — DX: Abnormal weight loss: R63.4

## 2014-10-25 HISTORY — DX: Tremor, unspecified: R25.1

## 2014-10-25 HISTORY — DX: Unsteadiness on feet: R26.81

## 2014-10-25 HISTORY — DX: Other chronic pain: G89.29

## 2014-10-25 HISTORY — DX: Unspecified abdominal pain: R10.9

## 2014-10-25 HISTORY — DX: Palpitations: R00.2

## 2014-10-25 LAB — CBC WITH DIFFERENTIAL/PLATELET
BASOS ABS: 0 10*3/uL (ref 0.0–0.1)
Basophils Relative: 0 % (ref 0–1)
Eosinophils Absolute: 0 10*3/uL (ref 0.0–0.7)
Eosinophils Relative: 0 % (ref 0–5)
HEMATOCRIT: 41.5 % (ref 36.0–46.0)
Hemoglobin: 14.5 g/dL (ref 12.0–15.0)
LYMPHS PCT: 16 % (ref 12–46)
Lymphs Abs: 1.3 10*3/uL (ref 0.7–4.0)
MCH: 31.1 pg (ref 26.0–34.0)
MCHC: 34.9 g/dL (ref 30.0–36.0)
MCV: 89.1 fL (ref 78.0–100.0)
Monocytes Absolute: 0.5 10*3/uL (ref 0.1–1.0)
Monocytes Relative: 6 % (ref 3–12)
NEUTROS ABS: 6.2 10*3/uL (ref 1.7–7.7)
NEUTROS PCT: 78 % — AB (ref 43–77)
Platelets: 323 10*3/uL (ref 150–400)
RBC: 4.66 MIL/uL (ref 3.87–5.11)
RDW: 12.9 % (ref 11.5–15.5)
WBC: 7.9 10*3/uL (ref 4.0–10.5)

## 2014-10-25 LAB — CBG MONITORING, ED
Glucose-Capillary: 402 mg/dL — ABNORMAL HIGH (ref 65–99)
Glucose-Capillary: 269 mg/dL — ABNORMAL HIGH (ref 65–99)

## 2014-10-25 LAB — URINALYSIS, ROUTINE W REFLEX MICROSCOPIC
Bilirubin Urine: NEGATIVE
Ketones, ur: NEGATIVE mg/dL
LEUKOCYTES UA: NEGATIVE
Nitrite: NEGATIVE
PROTEIN: NEGATIVE mg/dL
Specific Gravity, Urine: 1.02 (ref 1.005–1.030)
Urobilinogen, UA: 0.2 mg/dL (ref 0.0–1.0)
pH: 6 (ref 5.0–8.0)

## 2014-10-25 LAB — BASIC METABOLIC PANEL
ANION GAP: 11 (ref 5–15)
BUN: 30 mg/dL — ABNORMAL HIGH (ref 6–20)
CO2: 22 mmol/L (ref 22–32)
Calcium: 9.1 mg/dL (ref 8.9–10.3)
Chloride: 100 mmol/L — ABNORMAL LOW (ref 101–111)
Creatinine, Ser: 0.91 mg/dL (ref 0.44–1.00)
GFR, EST NON AFRICAN AMERICAN: 59 mL/min — AB (ref >60)
GLUCOSE: 367 mg/dL — AB (ref 65–99)
POTASSIUM: 4 mmol/L (ref 3.5–5.1)
SODIUM: 133 mmol/L — AB (ref 135–145)

## 2014-10-25 LAB — URINE MICROSCOPIC-ADD ON

## 2014-10-25 MED ORDER — GLIPIZIDE 5 MG PO TABS: 5.0000 mg | ORAL_TABLET | Freq: Every day | ORAL | Status: DC

## 2014-10-25 MED ORDER — SODIUM CHLORIDE 0.9 % IV BOLUS (SEPSIS)
1000.0000 mL | Freq: Once | INTRAVENOUS | Status: AC
  Administered 2014-10-25: 1000 mL via INTRAVENOUS

## 2014-10-25 NOTE — Discharge Instructions (Signed)
°Emergency Department Resource Guide °1) Find a Doctor and Pay Out of Pocket °Although you won't have to find out who is covered by your insurance plan, it is a good idea to ask around and get recommendations. You will then need to call the office and see if the doctor you have chosen will accept you as a new patient and what types of options they offer for patients who are self-pay. Some doctors offer discounts or will set up payment plans for their patients who do not have insurance, but you will need to ask so you aren't surprised when you get to your appointment. ° °2) Contact Your Local Health Department °Not all health departments have doctors that can see patients for sick visits, but many do, so it is worth a call to see if yours does. If you don't know where your local health department is, you can check in your phone book. The CDC also has a tool to help you locate your state's health department, and many state websites also have listings of all of their local health departments. ° °3) Find a Walk-in Clinic °If your illness is not likely to be very severe or complicated, you may want to try a walk in clinic. These are popping up all over the country in pharmacies, drugstores, and shopping centers. They're usually staffed by nurse practitioners or physician assistants that have been trained to treat common illnesses and complaints. They're usually fairly quick and inexpensive. However, if you have serious medical issues or chronic medical problems, these are probably not your best option. ° °No Primary Care Doctor: °- Call [facility] at  832-8000 - they can help you locate a primary care doctor that  accepts your insurance, provides certain services, etc. °- Physician Referral Service- 1-800-533-3463 ° °Chronic Pain Problems: °Organization         Address  Phone   Notes  °[facility]  ([phone]) [phone] Patients need to be referred by their primary care doctor.  ° °Medication  Assistance: °Organization         Address  Phone   Notes  °[place] [facility] [address] °[place], [place] [address] (336) 641-8030 --Must be a resident of [place] °-- Must have NO insurance coverage whatsoever (no Medicaid/ Medicare, etc.) °-- The pt. MUST have a primary care doctor that directs their care regularly and follows them in the community °  °[facility]  (866) 331-1348   °[employer]  (888) 892-1162   ° °Agencies that provide inexpensive medical care: °Organization         Address  Phone   Notes  °[facility]  (336) 832-8035   °[facility]    (336) 832-7272   °[facility] [address] °[place], [place] [address] (336) 832-4777   °[facility] [address], °[place] (336) 271-4999   °[facility]    (336) 373-0678   °[facility]    (336) 272-1050   °[facility] ° [address], [place] Phone:  (336) 832-4444, Fax:  (336) 832-4440 Hours of Operation:  9 am - 6 pm, M-F.  Also accepts Medicaid/Medicare and self-pay.  °[facility] ° [address], [place] Phone: (336) [phone], Fax: (336) [phone]. Hours of Operation:  8:30 am - 5:30 pm, M-F.  Also accepts Medicaid and self-pay.  °[facility] [place] [address], [place] Phone: (336) 878-6027   °[facility] [address], [place], [place] (336)723-1848, Ext. 123 Mondays & Thursdays: 7-9 AM.  First 15 patients are seen on a first come, first serve basis. °  ° °Medicaid-accepting [place] Providers: ° °Organization         Address  Phone   Notes  °[facility] [address], [place] (336) 641-2100 Also accepts self-pay patients.  °[facility] [address], [place] ° (336) 856-9996   °[facility] [address], [place]  (336) 288-8857   °[facility] [address] [place] [address], [place] (336) 299-7000   °Veita Bland [address], [place]  ° (336) 373-1557 Only accepts [place] Access Medicaid patients after they have their name applied to their card.  ° °Self-Pay (no insurance) in [place]: ° °Organization         Address  Phone   Notes  °Sickle Cell Patients, [facility] [address], [place] (336) 832-1970   °[facility] [address], [place] (336) 832-4400   °[facility] ° [address], [place] (336) 992-4800   °[facility]/Dr. Osei-Bonsu ° [address] [place] [address], [place] or [address], [place] (336) 841-8500 Phone number for both [place] and [place] locations is the same.  °[facility] [address], [place] (336) 299-0000   °[facility] [address] [place] [address], [place] or [address] (336) 852-7530 °(336) 878-2260   °[facility] [address], [place] (336) 350-1642, phone; (336) 294-5005, fax Sees patients 1st and 3rd Saturday of every month.  Must not qualify for public or private insurance (i.e. Medicaid, Medicare, [place] Health Choice, Veterans' Benefits) • Household income should be no more than 200% of the poverty level •The clinic cannot treat you if you are pregnant or think you are pregnant • Sexually transmitted diseases are not treated at the clinic.  ° ° °Dental Care: °Organization         Address  Phone  Notes  °[place] [facility] [address], [place] (336) 641-6152 Accepts children up to age 21 who are enrolled in Medicaid or [place] Health Choice; pregnant women with a Medicaid card; and children who have applied for Medicaid or [place] Health Choice, but were declined, whose parents can pay a reduced fee at time of service.  °[place]  [facility] [place]  [address], [place] (336) 641-7733 Accepts children up to age 21 who are enrolled in Medicaid or [place] Health Choice; pregnant women with a Medicaid card; and children who have applied for Medicaid or [place] Health Choice, but were declined, whose parents can pay a reduced fee at time of service.  °[facility] ° [address], [place] (336) 641-4533 Patients are seen by appointment only. Walk-ins are not accepted. [facility] will see patients 18 years of age and older. °Monday - Tuesday (8am-5pm) °Most Wednesdays (8:30-5pm) °$30 per visit, cash only  °[facility] ° [address], [place] (336) 641-4533 Patients are seen by appointment only. Walk-ins are not accepted. [facility] will see patients 18 years of age and older. °One   Wednesday Evening (Monthly: Volunteer Based).  $30 per visit, cash only  °[facility]  (919) 537-3737 for adults; Children under age 4, call Graduate Pediatric Dentistry at ([phone]) [phone]. Children aged 4-14, please call ([phone]) [phone] to request a pediatric application. ° Dental services are provided in all areas of dental care including fillings, crowns and bridges, complete and partial dentures, implants, gum treatment, root canals, and extractions. Preventive care is also provided. Treatment is provided to both adults and children. °Patients are selected via a lottery and there is often a waiting list. °  °[facility] [address], °[place] ° (336) 763-8833 [email or web address] °  °[facility] [address], [place], [place] (336)723-1848, Ext. 123 Second and Fourth Thursday of each month, opens at 6:30 AM; Clinic ends at 9 AM.  Patients are seen on a first-come first-served basis, and a limited number are seen during each clinic.  ° °[facility] ° [address], [place], [place] (336) 723-7904    Eligibility Requirements °You must have lived in [place], [place], or [place] counties for at least the last three months. °  You cannot be eligible for state or federal sponsored healthcare insurance, including Veterans Administration, Medicaid, or Medicare. °  You generally cannot be eligible for healthcare insurance through your employer.  °  How to apply: °Eligibility screenings are held every Tuesday and Wednesday afternoon from 1:00 pm until 4:00 pm. You do not need an appointment for the interview!  °[facility] [address], [place], [place] 336-631-2330   °[facility]  336-342-8273   °[facility]  336-703-3100   °[facility]  336-570-6415   ° °Behavioral Health Resources in the Community: °Intensive Outpatient Programs °Organization         Address  Phone  Notes  °[facility] [address], [place], [place] 336-878-6098   °[facility] [address], [place], [place] 336-832-9800   °[facility] [address], [place], [place] ° 336-882-2125   °[facility] [address],  °[place], [place] 1-800-853-5163 or 336-641-4981   °Substance Abuse Resources °Organization         Address  Phone  Notes  °[facility]  336-882-2125   °[facility]  336-784-9470   °[facility]  336-285-9073   °[facility]  336-845-3988   °Residential & Outpatient Substance Abuse Program  1-800-659-3381   °Psychological Services °Organization         Address  Phone  Notes  °[facility]  336- 832-9600   °[facility]  336- 378-7881   °[facility] [address], [place] 1-800-853-5163 or 336-641-4981   ° °Mobile Crisis Teams °Organization         Address  Phone  Notes  °[facility], Mobile Crisis Care Unit  1-877-626-1772   °Assertive °[facility] ° [address]  [place], [place] 336-834-9664   °Sharon DeEsch [address] °[place] 336-554-5454   ° °Self-Help/Support Groups °Organization         Address  Phone             Notes  °[facility]  - variety of support groups  336- 373-1402 Call for more information  °Narcotics Anonymous (NA), [facility] [address], °[place]  2 meetings at this location  ° °  Residential Treatment Programs Organization         Address  Phone  Notes  [facility] [address],    [place]  1-984-068-6654   [facility]  [address] [place] [address], [place], [place]   [place], [place] 215-374-7070 Admissions: 8am-3pm M-F  [facility] [place] [address],    [place], [place] 503-888-2800   [facility] [address] [place], [place], [place]   [facility] [address],  [place], [place]   [facility] - Intensive Outpatient [address], [place], [place]   [facility] ([place] [place]  [place], [place] 1-787-424-0138 or 820-385-1570   [facility] [address] [place], [place] Accepts Medicaid  [facility] [address]  [place] 1-571-694-5627 Substance Abuse/Addiction Treatment   [facility]         Address  Phone  Notes  [facility]  7025042628   Domenic Schwab, PhD [address] [place], [place]   3046773416 or 332-523-6856   [facility], [place] 785-686-7174   [facility] [address] [place], [place] (308)199-0883 Insurance/Medicaid/sponsorship through [facility] [address]                                    [place], [place] 780-634-7666 [facility] [address], [place] [phone]    Dr. Adele Schilder  (782) 813-3097   [facility] 1) [address] [place] 2) [place] 3)  [address], [place] 808-678-7077 7754248826  570-040-5288   [place] [phone] or 703-593-1367 (After Hours)      Take the prescription as directed (take this only while you are taking the prednisone). Call your regular medical doctor on Monday to schedule a follow up appointment within the next 3 days. Return to the Emergency Department immediately sooner if worsening.

## 2014-10-25 NOTE — ED Provider Notes (Signed)
CSN: [id number]     Arrival date & time 10/25/14  1715 History   First MD Initiated Contact with Patient 10/25/14 1748     Chief Complaint  Patient presents with  . Hyperglycemia      HPI Pt was seen at 1755. Per pt, c/o gradual onset and persistence of constant "high blood sugars" for the past 1 week. Pt states her CBG has been elevated since receiving IM decadron and starting PO prednisone taper for COPD exacerbation on 10/20/2014. Pt states she has been taking her usual victoza without change. Pt states she also "took some old glipizide" without significant change. Denies CP/SOB, no new or change in her usual chronic abd pain, no N/V/D, no fevers, no rash.    Past Medical History  Diagnosis Date  . Benign tumor of breast 2004    left  . Broken ribs     history of several broken ribs on right and compression  fx  . Fibroma of skin     Multiple fibromas/lipomas on arms and legs B  . Anxiety   . COPD (chronic obstructive pulmonary disease)   . Hyperlipidemia   . History of CT scan of abdomen 05/23/2003    Mild fatty liver inf, tiny hypodensity in right lobe of liver  . Pulmonary nodules   . Diabetes mellitus without complication   . Hx of radiation therapy 04/11/12- 05/08/12    left chest wall area, 40 gray 20 fx  . Hypertension   . Diverticulosis   . Adenomatous colon polyp   . Cancer     non-hodgkins b cell lymphoma  . 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 Depression   . Pyelonephritis   . Kidney stone 02/21/1981    Right/removed  . GERD (gastroesophageal reflux disease)   . Osteoarthritis     hands, degenerative back , osteoporosis   . Cold virus     seen by Dr. Deborra Medina on 10/12, given Rx for antibiotic & steroid  . CHF (congestive heart failure)     hosp. for CHF- 10/2013  . Shortness of breath     only with movemnent   . C. difficile colitis 06/21/2014  . UTI (lower urinary tract infection)   . Palpitations   . Tremor    . Weight loss     "multifactorial" per PMD note  . Chronic abdominal pain   . Gait instability    Past Surgical History  Procedure Laterality Date  . Appendectomy  02/21/1961  . Exploratory laparotomy  02/21/1970    for ? adhesions  . Abdominal hysterectomy  02/21/1970    right ovary remaining  . Bone marrow biopsy  03/09/2012  . Lung core biopsy  02/06/2012  . Tonsillectomy    . Breast surgery  1970's    lump removed- benign   . Tubal ligation    . Eye surgery      bilateral cataracts removed - /W- IOL  . Cholecystectomy N/A 12/11/2013    Procedure: LAPAROSCOPIC CHOLECYSTECTOMY WITH INTRAOPERATIVE CHOLANGIOGRAM;  Surgeon: Erroll Luna, MD;  Location: [facility];  Service: General;  Laterality: N/A;   Family History  Problem Relation Age of Onset  . Parkinsonism Brother   . Stroke Father   . Cancer Sister   . Heart disease Brother   . Clotting disorder Father   . Kidney disease Mother    Social History  Substance Use Topics  . Smoking status: Current Every Day Smoker --  0.50 packs/day for 30 years    Types: Cigarettes  . Smokeless tobacco: Never Used     Comment: 3/4 pack X 50 years  . Alcohol Use: No    Review of Systems ROS: Statement: All systems negative except as marked or noted in the HPI; Constitutional: Negative for fever and chills. +elevated blood sugars.; ; Eyes: Negative for eye pain, redness and discharge. ; ; ENMT: Negative for ear pain, hoarseness, nasal congestion, sinus pressure and sore throat. ; ; Cardiovascular: Negative for chest pain, palpitations, diaphoresis, dyspnea and peripheral edema. ; ; Respiratory: Negative for cough, wheezing and stridor. ; ; Gastrointestinal: +chronic abd pain. Negative for nausea, vomiting, diarrhea, blood in stool, hematemesis, jaundice and rectal bleeding. . ; ; Genitourinary: Negative for dysuria, flank pain and hematuria. ; ; Musculoskeletal: Negative for back pain and neck pain. Negative for swelling and trauma.; ; Skin:  Negative for pruritus, rash, abrasions, blisters, bruising and skin lesion.; ; Neuro: Negative for headache, lightheadedness and neck stiffness. Negative for weakness, altered level of consciousness , altered mental status, extremity weakness, paresthesias, involuntary movement, seizure and syncope.      Allergies  Sulfonamide derivatives and Metformin and related  Home Medications   Prior to Admission medications   Medication Sig Start Date End Date Taking? Authorizing Provider  albuterol (PROVENTIL HFA;VENTOLIN HFA) 108 (90 BASE) MCG/ACT inhaler Inhale 2 puffs into the lungs every 6 (six) hours as needed for wheezing or shortness of breath.   Yes Historical Provider, MD  ALPRAZolam Duanne Moron) 0.5 MG tablet Take 1 tablet (0.5 mg total) by mouth 3 (three) times daily as needed for anxiety. 10/10/14  Yes Lucille Passy, MD  aspirin EC 81 MG tablet Take 81 mg by mouth every morning.   Yes Historical Provider, MD  citalopram (CELEXA) 40 MG tablet Take 1 tablet (40 mg total) by mouth daily. 02/27/14  Yes Lucille Passy, MD  doxycycline (VIBRA-TABS) 100 MG tablet Take 1 tablet (100 mg total) by mouth 2 (two) times daily. 10/20/14  Yes Lucille Passy, MD  Liraglutide (VICTOZA) 18 MG/3ML SOPN Inject 0.2 mLs (1.2 mg total) into the skin daily. 10/01/14  Yes Philemon Kingdom, MD  losartan (COZAAR) 50 MG tablet Take 1/2 tablet daily for 4 days and then increase it to one whole tablet daily as previously prescribed. Patient taking differently: Take 50 mg by mouth daily.  07/22/14  Yes Lucille Passy, MD  metoprolol succinate (TOPROL XL) 50 MG 24 hr tablet Take 1 tablet (50 mg total) by mouth daily. Take with or immediately following a meal. 09/15/14  Yes Lucille Passy, MD  predniSONE (DELTASONE) 20 MG tablet 3 tabs by mouth x 3 days, 2 tabs by mouth x 3 days, 1 tab by mouth x 2 days, 1/2 tab by mouth x 2 days 10/20/14  Yes Lucille Passy, MD  vitamin B-12 (CYANOCOBALAMIN) 500 MCG tablet Take 500 mcg by mouth daily.   Yes  Historical Provider, MD  Blood Glucose Monitoring Suppl ([place]) W/DEVICE KIT Use as directed to test blood sugar three times daily Dx:R73.09 05/01/14   Lucille Passy, MD  glucose blood test strip Use as instructed 05/01/14   Lucille Passy, MD  Insulin Pen Needle 32G X 4 MM MISC Use to inject once daily. 06/17/14   Philemon Kingdom, MD  ONE TOUCH LANCETS MISC Use as directed to test blood sugar three times daily Dx:R73.09 05/01/14   Lucille Passy, MD  BP 148/74 mmHg  Pulse 82  Temp(Src) 97.4 F (36.3 C) (Oral)  Resp 18  Ht 5' 1"  (1.549 m)  Wt 110 lb 8 oz (50.122 kg)  BMI 20.89 kg/m2  SpO2 100% Physical Exam 1800: Physical examination:  Nursing notes reviewed; Vital signs and O2 SAT reviewed;  Constitutional: Well developed, Well nourished, Well hydrated, In no acute distress; Head:  Normocephalic, atraumatic; Eyes: EOMI, PERRL, No scleral icterus; ENMT: Mouth and pharynx normal, Mucous membranes moist; Neck: Supple, Full range of motion, No lymphadenopathy; Cardiovascular: Regular rate and rhythm, No gallop; Respiratory: Breath sounds clear & equal bilaterally, No wheezes.  Speaking full sentences with ease, Normal respiratory effort/excursion; Chest: Nontender, Movement normal; Abdomen: Soft, Nontender when distracted. Nondistended, Normal bowel sounds; Genitourinary: No CVA tenderness; Extremities: Pulses normal, No tenderness, No edema, No calf edema or asymmetry.; Neuro: AA&Ox3, Major CN grossly intact.  Speech clear. No gross focal motor or sensory deficits in extremities.; Skin: Color normal, Warm, Dry.; Psych:  Anxious.    ED Course  Procedures (including critical care time) Labs Review  Imaging Review  I have personally reviewed and evaluated these images and lab results as part of my medical decision-making.   EKG Interpretation None      MDM  MDM Reviewed: previous chart, nursing note and vitals Reviewed previous: labs and CT scan Interpretation:  labs    Results for orders placed or performed during the hospital encounter of 10/25/14  Urinalysis, Routine w reflex microscopic  Result Value Ref Range   Color, Urine YELLOW YELLOW   APPearance CLEAR CLEAR   Specific Gravity, Urine 1.020 1.005 - 1.030   pH 6.0 5.0 - 8.0   Glucose, UA >1000 (A) NEGATIVE mg/dL   Hgb urine dipstick TRACE (A) NEGATIVE   Bilirubin Urine NEGATIVE NEGATIVE   Ketones, ur NEGATIVE NEGATIVE mg/dL   Protein, ur NEGATIVE NEGATIVE mg/dL   Urobilinogen, UA 0.2 0.0 - 1.0 mg/dL   Nitrite NEGATIVE NEGATIVE   Leukocytes, UA NEGATIVE NEGATIVE  Basic metabolic panel  Result Value Ref Range   Sodium 133 (L) 135 - 145 mmol/L   Potassium 4.0 3.5 - 5.1 mmol/L   Chloride 100 (L) 101 - 111 mmol/L   CO2 22 22 - 32 mmol/L   Glucose, Bld 367 (H) 65 - 99 mg/dL   BUN 30 (H) 6 - 20 mg/dL   Creatinine, Ser 0.91 0.44 - 1.00 mg/dL   Calcium 9.1 8.9 - 10.3 mg/dL   GFR calc non Af Amer 59 (L) >60 mL/min   GFR calc Af Amer >60 >60 mL/min   Anion gap 11 5 - 15  CBC with Differential  Result Value Ref Range   WBC 7.9 4.0 - 10.5 K/uL   RBC 4.66 3.87 - 5.11 MIL/uL   Hemoglobin 14.5 12.0 - 15.0 g/dL   HCT 41.5 36.0 - 46.0 %   MCV 89.1 78.0 - 100.0 fL   MCH 31.1 26.0 - 34.0 pg   MCHC 34.9 30.0 - 36.0 g/dL   RDW 12.9 11.5 - 15.5 %   Platelets 323 150 - 400 K/uL   Neutrophils Relative % 78 (H) 43 - 77 %   Neutro Abs 6.2 1.7 - 7.7 K/uL   Lymphocytes Relative 16 12 - 46 %   Lymphs Abs 1.3 0.7 - 4.0 K/uL   Monocytes Relative 6 3 - 12 %   Monocytes Absolute 0.5 0.1 - 1.0 K/uL   Eosinophils Relative 0 0 - 5 %   Eosinophils Absolute 0.0  0.0 - 0.7 K/uL   Basophils Relative 0 0 - 1 %   Basophils Absolute 0.0 0.0 - 0.1 K/uL  Urine microscopic-add on  Result Value Ref Range   Squamous Epithelial / LPF RARE RARE   WBC, UA 0-2 <3 WBC/hpf   RBC / HPF 0-2 <3 RBC/hpf  POC CBG, ED  Result Value Ref Range   Glucose-Capillary 402 (H) 65 - 99 mg/dL  CBG monitoring, ED  Result Value  Ref Range   Glucose-Capillary 269 (H) 65 - 99 mg/dL     1800:  Pt also c/o several chronic issues: abd pain, weight loss. When distracted, abd exam today is benign. States to ED staff "no one can figure out what's wrong with me." EPIC chart review reveals pt has had multiple PMD and ED visits for same, most recently last month, with reassuring workups (labs, CT A/P). Pt reminded of this as well as ED role in healthcare clarified to pt and her family. Pt continues to perseverate regarding "a CO2 level" "I need a CO2 level because that's how my husband died." Pt again reassured by myself and ED RN that she has had this checked repeatedly as part of her lab tests and it has been normal. Pt continues to perseverate regarding same. Pt again reassured this will be part of her labs today. Pt and family verb understanding.   2055:  IVF given with CBG trending downward. Workup reassuring. Another long d/w pt regarding her labs, CBG elevation r/t prednisone, etc. Pt much calmer now, smiling, appreciative and thankful to ED staff. Pt would like to go home now. Will have pt take her former glipizide 26m PO qdaily in addition to her usual SQ DM med for the next 6 days of her prednisone taper. Pt is comfortable with this plan. Dx and testing d/w pt and family.  Questions answered.  Verb understanding, agreeable to d/c home with outpt f/u.      KFrancine Graven DO 10/28/14 [phone]

## 2014-10-25 NOTE — ED Notes (Signed)
Discharge instructions given, pt demonstrated teach back and verbal understanding. No concerns voiced.  

## 2014-10-25 NOTE — ED Notes (Signed)
Pt has noticed that her blood sugar has been elevated past couple of days - has taken extra doses of her diabetic meds. Pt states that she has been losing weight and doesn't know why. Also co RT flank pain off and on since gallbladder surgery last year

## 2014-11-11 ENCOUNTER — Ambulatory Visit (INDEPENDENT_AMBULATORY_CARE_PROVIDER_SITE_OTHER): Payer: PPO | Admitting: Internal Medicine

## 2014-11-11 ENCOUNTER — Encounter: Payer: Self-pay | Admitting: Internal Medicine

## 2014-11-11 VITALS — BP 126/64 | HR 88 | Temp 97.4°F | Resp 14 | Wt 111.4 lb

## 2014-11-11 DIAGNOSIS — E1165 Type 2 diabetes mellitus with hyperglycemia: Secondary | ICD-10-CM

## 2014-11-11 MED ORDER — INSULIN PEN NEEDLE 32G X 4 MM MISC
Status: DC
Start: 2014-11-11 — End: 2014-12-23

## 2014-11-11 MED ORDER — INSULIN GLARGINE 100 UNIT/ML SOLOSTAR PEN: 10.0000 [IU] | PEN_INJECTOR | Freq: Every day | SUBCUTANEOUS | Status: DC

## 2014-11-11 MED ORDER — INSULIN ASPART 100 UNIT/ML FLEXPEN: 4.0000 [IU] | PEN_INJECTOR | Freq: Three times a day (TID) | SUBCUTANEOUS | Status: DC

## 2014-11-11 NOTE — Patient Instructions (Signed)
Please start Lantus 10 units in am. Start NovoLog insulin 4 units before a smaller meal and 6 units before a larger meal. Stop Victoza for now.  Please call me with sugars in 3 days.  Also, please let me know if the sugars are consistently <80 or >200.  Please return in 1-1.5 month with your sugar log.

## 2014-11-11 NOTE — Progress Notes (Signed)
Patient ID: Marie Holmes, female   DOB: 04/24/1935, 79 y.o.   MRN: [id number]  HPI: Marie Holmes is a 79 y.o.-year-old female, returning for follow-up for DM2, dx in ~2010, non-insulin-dependent, uncontrolled, without complications. Last visit 5 months ago. Insurance: [employer].  She was in the emergency room on 10/25/2014 with CBGs in the 400s (after being given a steroid inj on 10/20/2014 and started on Prednisone for URI). She was previously in the ED 2x in 08/2014.  Last hemoglobin A1c was: Lab Results  Component Value Date   HGBA1C 7.0* 09/15/2014   HGBA1C 7.9* 04/02/2014   HGBA1C 7.9* 08/12/2013   Pt is on a regimen of: - Victoza 1.2 mg daily  She stopped Glipizide XL 1 week ago.  She tried: - Metformin and metformin extended-release >> diarrhea, incontinence - Bydureon >> cost prohibitive - Victoza 0.6 mg 2x day - this worked in the past, but pushes her earlier in the donut hole (she liked this, though)  Pt checks her sugars 2-3x a day and they are dramatically increased (per meter download): - am (~ 5 am): 141-180 (8 am) 135, 163 - 233 >> 108-289 - 2h after b'fast: n/c >> 311-413 - before lunch: 99, 116-223 >> 217-444 - 2h after lunch: 170s >> 193-525 - before dinner: 119-203 >> 167-473 - 2h after dinner: 117 >> 235-345 - bedtime: n/c - nighttime: n/c No lows. Lowest sugar was 108; she has hypoglycemia awareness at 80  Highest sugar was 525.  Glucometer: One Touch Ultra Mini  Pt's meals are: - Breakfast: 3 PB crackers (nabs) - Lunch: meat + veggies + creamed potatoes, strawberry pie - Dinner: PB banana sandwich - Snacks: snack  - + mild CKD, last BUN/creatinine:  Lab Results  Component Value Date   BUN 30* 10/25/2014   CREATININE 0.91 10/25/2014  On Losartan. - last set of lipids: Lab Results  Component Value Date   CHOL 221* 04/02/2014   HDL 60.00 04/02/2014   LDLCALC 128* 04/02/2014   LDLDIRECT 150.9 04/11/2013   TRIG 167.0* 04/02/2014    CHOLHDL 4 04/02/2014  She was on Zocor >> stopped long time ago. - last eye exam was in 2013. No DR.  - no numbness and tingling in her feet.  She has a h/o Non Hodgkin Lymphoma, COPD, HL, Osteoporosis (she stopped Alendronate b/c somnolence).  ROS: Constitutional: + weight loss, + fatigue, + hot flushes Eyes: no blurry vision, no xerophthalmia ENT: no sore throat, no nodules palpated in throat, no dysphagia/odynophagia, no hoarseness Cardiovascular: no CP/SOB/palpitations/leg swelling Respiratory: + cough/no SOB/+ wheezing Gastrointestinal: no N/nV/+ D/+ C/+ heartburn Musculoskeletal: no muscle/+ joint aches Skin: no rashes, + dry skin Neurological: + occasional tremors/no numbness/tingling/dizziness  I reviewed pt's medications, allergies, PMH, social hx, family hx, and changes were documented in the history of present illness. Otherwise, unchanged from my initial visit note:  Past Medical History  Diagnosis Date  . Benign tumor of breast 2004    left  . Broken ribs     history of several broken ribs on right and compression  fx  . Fibroma of skin     Multiple fibromas/lipomas on arms and legs B  . Anxiety   . COPD (chronic obstructive pulmonary disease)   . Hyperlipidemia   . History of CT scan of abdomen 05/23/2003    Mild fatty liver inf, tiny hypodensity in right lobe of liver  . Pulmonary nodules   . Diabetes mellitus without complication   . Hx  of radiation therapy 04/11/12- 05/08/12    left chest wall area, 40 gray 20 fx  . Hypertension   . Diverticulosis   . Adenomatous colon polyp   . Cancer     non-hodgkins b cell lymphoma  . 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 Depression   . Pyelonephritis   . Kidney stone 02/21/1981    Right/removed  . GERD (gastroesophageal reflux disease)   . Osteoarthritis     hands, degenerative back , osteoporosis   . Cold virus     seen by Dr. Deborra Medina on 10/12, given Rx for  antibiotic & steroid  . CHF (congestive heart failure)     hosp. for CHF- 10/2013  . Shortness of breath     only with movemnent   . C. difficile colitis 06/21/2014  . UTI (lower urinary tract infection)   . Palpitations   . Tremor   . Weight loss     "multifactorial" per PMD note  . Chronic abdominal pain   . Gait instability    Past Surgical History  Procedure Laterality Date  . Appendectomy  02/21/1961  . Exploratory laparotomy  02/21/1970    for ? adhesions  . Abdominal hysterectomy  02/21/1970    right ovary remaining  . Bone marrow biopsy  03/09/2012  . Lung core biopsy  02/06/2012  . Tonsillectomy    . Breast surgery  1970's    lump removed- benign   . Tubal ligation    . Eye surgery      bilateral cataracts removed - /W- IOL  . Cholecystectomy N/A 12/11/2013    Procedure: LAPAROSCOPIC CHOLECYSTECTOMY WITH INTRAOPERATIVE CHOLANGIOGRAM;  Surgeon: Erroll Luna, MD;  Location: [facility];  Service: General;  Laterality: N/A;   History   Social History  . Marital Status: Widowed    Spouse Name: N/A  . Number of Children: 4   Occupational History  . Retired     Investment banker, operational   Social History Main Topics  . Smoking status: Current Every Day Smoker -- 0.50 packs/day for 30 years    Types: Cigarettes  . Smokeless tobacco: Never Used     Comment: 3/4 pack X 50 years  . Alcohol Use: No  . Drug Use: No  . Sexual Activity: Not on file     Comment: pt. stated, " I started smoking again."   Current Outpatient Prescriptions on File Prior to Visit  Medication Sig Dispense Refill  . albuterol (PROVENTIL HFA;VENTOLIN HFA) 108 (90 BASE) MCG/ACT inhaler Inhale 2 puffs into the lungs every 6 (six) hours as needed for wheezing or shortness of breath.    . ALPRAZolam (XANAX) 0.5 MG tablet Take 1 tablet (0.5 mg total) by mouth 3 (three) times daily as needed for anxiety. 90 tablet 0  . aspirin EC 81 MG tablet Take 81 mg by mouth every morning.    . Blood Glucose  Monitoring Suppl ([place]) W/DEVICE KIT Use as directed to test blood sugar three times daily Dx:R73.09 1 each 0  . citalopram (CELEXA) 40 MG tablet Take 1 tablet (40 mg total) by mouth daily. 90 tablet 3  . doxycycline (VIBRA-TABS) 100 MG tablet Take 1 tablet (100 mg total) by mouth 2 (two) times daily. 20 tablet 0  . glipiZIDE (GLUCOTROL) 5 MG tablet Take 1 tablet (5 mg total) by mouth daily before breakfast. Take only while you are taking prednisone 6 tablet 0  .  glucose blood test strip Use as instructed 100 each 6  . Insulin Pen Needle 32G X 4 MM MISC Use to inject once daily. 90 each 1  . Liraglutide (VICTOZA) 18 MG/3ML SOPN Inject 0.2 mLs (1.2 mg total) into the skin daily. 6 pen 1  . losartan (COZAAR) 50 MG tablet Take 1/2 tablet daily for 4 days and then increase it to one whole tablet daily as previously prescribed. (Patient taking differently: Take 50 mg by mouth daily. ) 30 tablet 3  . metoprolol succinate (TOPROL XL) 50 MG 24 hr tablet Take 1 tablet (50 mg total) by mouth daily. Take with or immediately following a meal. 30 tablet 3  . ONE TOUCH LANCETS MISC Use as directed to test blood sugar three times daily Dx:R73.09 300 each 2  . predniSONE (DELTASONE) 20 MG tablet 3 tabs by mouth x 3 days, 2 tabs by mouth x 3 days, 1 tab by mouth x 2 days, 1/2 tab by mouth x 2 days 20 tablet 0  . vitamin B-12 (CYANOCOBALAMIN) 500 MCG tablet Take 500 mcg by mouth daily.     No current facility-administered medications on file prior to visit.   Allergies  Allergen Reactions  . Sulfonamide Derivatives Anaphylaxis  . Metformin And Related Diarrhea   Family History  Problem Relation Age of Onset  . Parkinsonism Brother   . Stroke Father   . Cancer Sister   . Heart disease Brother   . Clotting disorder Father   . Kidney disease Mother    PE: BP 126/64 mmHg  Pulse 88  Temp(Src) 97.4 F (36.3 C) (Oral)  Resp 14  Wt 111 lb 6.4 oz (50.531 kg)  SpO2 94% Body mass index is  21.06 kg/(m^2). Wt Readings from Last 3 Encounters:  11/11/14 111 lb 6.4 oz (50.531 kg)  10/25/14 110 lb 8 oz (50.122 kg)  10/20/14 111 lb 4 oz (50.463 kg)   Constitutional: thin, in NAD Eyes: PERRLA, EOMI, no exophthalmos ENT: moist mucous membranes, no thyromegaly, no cervical lymphadenopathy Cardiovascular: RRR, No MRG Respiratory: CTA B Gastrointestinal: abdomen soft, NT, ND, BS+ Musculoskeletal: no deformities, strength intact in all 4 Skin: moist, warm, no rashes Neurological: + head tremor and with with outstretched hands, DTR normal in all 4  ASSESSMENT: 1. DM2, non-insulin-dependent, uncontrolled, without complications  We tested her for DM1 >> negative: Component     Latest Ref Rng 06/10/2014  Glucose     70 - 99 mg/dL 136 (H)  C-Peptide     0.80 - 3.90 ng/mL 3.17  Glutamic Acid Decarb Ab     <5 IU/mL <5  Pancreatic Islet Cell Antibody     < 5 JDF Units <5   PLAN:  1. Patient with long-standing, reasonably controlled diabetes, on injectable antidiabetic regimen (Victoza), which became insufficient. Her sugars are mostly 200-400s, increasing as the day goes by. CBG in the office 280. She had crackers and peanut butter before coming here. Discussed the need to reduce number of carbs. However, we also need to start insulin, basal + bolus. Discussed correct injection techniques. - I suggested to:  Patient Instructions  Please start Lantus 10 units in am. Start NovoLog insulin 4 units before a smaller meal and 6 units before a larger meal. Stop Victoza for now.  Please call me with sugars in 3 days.  Also, please let me know if the sugars are consistently <80 or >200.  Please return in 1-1.5 month with your sugar log.   -  continue checking sugars at different times of the day - check 3 times a day, rotating checks - advised for yearly eye exams >> needs one - Return to clinic in 1-1.5 mo with sugar log

## 2014-11-19 ENCOUNTER — Telehealth: Payer: Self-pay | Admitting: Internal Medicine

## 2014-11-19 ENCOUNTER — Telehealth: Payer: Self-pay | Admitting: *Deleted

## 2014-11-19 NOTE — Telephone Encounter (Signed)
The novolog and lantus are 3 mon supplies and that is why its so expensive and she is in the donut hole

## 2014-11-19 NOTE — Telephone Encounter (Signed)
Called pt and advised her per Dr Arman Filter message below. Pt understood, but asked to wait to send in the rx. She will call and let us know when she is close to running out of insulin.

## 2014-11-19 NOTE — Telephone Encounter (Signed)
If she already started the insulin (regimen that I gave her at last visit), let's increase the doses as follows: - Lantus 10 units in am >> 14 - NovoLog insulin 4 units before a smaller meal and 6 units before a larger meal. Please use mostly the 6 units of Novolog 15 minutes before a meal.   If this insulin regimen is expensive for her, we can switch to NPH-regular, however, the regular insulin does not come in a pen. We may try NPH/regular 70/30, which comes in a pen, but is not an ideal regimen. We can, however, work with it if the above regimen is too expensive. I would use the Lantus and NovoLog until she is close to running out of them.

## 2014-11-19 NOTE — Telephone Encounter (Signed)
OK, let's try with 70/30 insulin pen (Humulin) when she runs out  - please tell her that this is premixed insulin - long + short acting mixed together: - 20 units before b'fast (30 min before)  - 14 units before dinner (30 min before)

## 2014-11-19 NOTE — Telephone Encounter (Signed)
Called Marie Holmes and advised her per Dr Arman Filter message below. Marie Holmes voiced understanding. Marie Holmes stated that the cost of the Lantus and Novolog was $384. She said she cannot afford the cost. She will finish these medications, but needs another medication that she can afford. Please advise.

## 2014-11-19 NOTE — Telephone Encounter (Signed)
Pt called stating that her sugars are high in the AM; high 100's - 200's. Her blood sugars are high seems like no matter she eats. Pt stated she eats fruits. Pt had cinnamon-apple oatmeal this morning for breakfast her sugar was 336. Advised pt that those packaged oatmeals are high in sugar. Pt is also concerned about the cost of her insulin. Please advise.

## 2014-11-20 NOTE — Telephone Encounter (Signed)
Read message below and be advised.

## 2014-11-27 ENCOUNTER — Encounter: Payer: Self-pay | Admitting: Family Medicine

## 2014-11-27 ENCOUNTER — Ambulatory Visit (INDEPENDENT_AMBULATORY_CARE_PROVIDER_SITE_OTHER)
Admission: RE | Admit: 2014-11-27 | Discharge: 2014-11-27 | Disposition: A | Discharge status: Home / Self Care | Payer: PPO | Source: Ambulatory Visit | Attending: Family Medicine | Admitting: Family Medicine

## 2014-11-27 ENCOUNTER — Ambulatory Visit (INDEPENDENT_AMBULATORY_CARE_PROVIDER_SITE_OTHER): Payer: PPO | Admitting: Family Medicine

## 2014-11-27 VITALS — BP 122/60 | HR 68 | Temp 98.0°F | Wt 116.2 lb

## 2014-11-27 DIAGNOSIS — J441 Chronic obstructive pulmonary disease with (acute) exacerbation: Secondary | ICD-10-CM | POA: Diagnosis not present

## 2014-11-27 LAB — T4, FREE: FREE T4: 0.84 ng/dL (ref 0.60–1.60)

## 2014-11-27 LAB — TSH: TSH: 0.9 u[IU]/mL (ref 0.35–4.50)

## 2014-11-27 MED ORDER — DOXYCYCLINE HYCLATE 100 MG PO TABS: 100.0000 mg | ORAL_TABLET | Freq: Two times a day (BID) | ORAL | Status: DC

## 2014-11-27 MED ORDER — DEXAMETHASONE SODIUM PHOSPHATE 10 MG/ML IJ SOLN
10.0000 mg | Freq: Once | INTRAMUSCULAR | Status: AC
  Administered 2014-11-27: 10 mg via INTRAMUSCULAR

## 2014-11-27 NOTE — Progress Notes (Signed)
Pre visit review using our clinic review tool, if applicable. No additional management support is needed unless otherwise documented below in the visit note. 
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
## 2014-11-27 NOTE — Assessment & Plan Note (Signed)
New- CXR today IM decardon Doxycycline twice daily x 10 days. Would like to avoid longer pred taper as previous DKA after taking prednsione. The patient indicates understanding of these issues and agrees with the plan.

## 2014-11-28 ENCOUNTER — Encounter: Payer: Self-pay | Admitting: *Deleted

## 2014-12-10 ENCOUNTER — Other Ambulatory Visit: Payer: Self-pay | Admitting: *Deleted

## 2014-12-10 NOTE — Telephone Encounter (Signed)
pts anxiety last addressed 02/2014

## 2014-12-11 MED ORDER — ALPRAZOLAM 0.5 MG PO TABS: 0.5000 mg | ORAL_TABLET | Freq: Three times a day (TID) | ORAL | Status: DC | PRN

## 2014-12-11 NOTE — Telephone Encounter (Signed)
Rx called in to requested pharmacy 

## 2014-12-23 ENCOUNTER — Other Ambulatory Visit (INDEPENDENT_AMBULATORY_CARE_PROVIDER_SITE_OTHER): Payer: PPO | Admitting: *Deleted

## 2014-12-23 ENCOUNTER — Ambulatory Visit (INDEPENDENT_AMBULATORY_CARE_PROVIDER_SITE_OTHER): Payer: PPO | Admitting: Internal Medicine

## 2014-12-23 ENCOUNTER — Encounter: Payer: Self-pay | Admitting: Internal Medicine

## 2014-12-23 VITALS — BP 118/64 | HR 86 | Temp 97.5°F | Resp 16 | Wt 118.0 lb

## 2014-12-23 DIAGNOSIS — E1165 Type 2 diabetes mellitus with hyperglycemia: Secondary | ICD-10-CM

## 2014-12-23 DIAGNOSIS — E119 Type 2 diabetes mellitus without complications: Secondary | ICD-10-CM

## 2014-12-23 DIAGNOSIS — Z794 Long term (current) use of insulin: Secondary | ICD-10-CM

## 2014-12-23 DIAGNOSIS — E1159 Type 2 diabetes mellitus with other circulatory complications: Secondary | ICD-10-CM

## 2014-12-23 DIAGNOSIS — IMO0001 Reserved for inherently not codable concepts without codable children: Secondary | ICD-10-CM | POA: Insufficient documentation

## 2014-12-23 LAB — POCT GLYCOSYLATED HEMOGLOBIN (HGB A1C): Hemoglobin A1C: 8.2

## 2014-12-23 MED ORDER — INSULIN GLARGINE 100 UNIT/ML SOLOSTAR PEN
14.0000 [IU] | PEN_INJECTOR | Freq: Every day | SUBCUTANEOUS | Status: DC
Start: 2014-12-23 — End: 2015-02-12

## 2014-12-23 MED ORDER — INSULIN PEN NEEDLE 32G X 4 MM MISC: Status: DC

## 2014-12-23 NOTE — Patient Instructions (Signed)
Please restart Lantus - take 10 units tonight at bedtime, then increase to 14 units at bedtime. If sugars in am are still high, in 1 week, increase to 18 units.  Please continue Novolog: - 6-6.5 units before a meal Can use 8 units for a large meal.  Please return in 1.5 months with your sugar log.

## 2014-12-23 NOTE — Progress Notes (Signed)
Patient ID: Marie Holmes, female   DOB: [birth year], 79 y.o.   MRN: [id number]  HPI: Marie Holmes is a 79 y.o.-year-old female, returning for follow-up for DM2, dx in ~2010, non-insulin-dependent, uncontrolled, without complications. Last visit 1.5 months ago. Insurance: [employer].  Last hemoglobin A1c was: Lab Results  Component Value Date   HGBA1C 7.0* 09/15/2014   HGBA1C 7.9* 04/02/2014   HGBA1C 7.9* 08/12/2013   She was in the emergency room on 10/25/2014 with CBGs in the 400s (after being given a steroid inj on 10/20/2014 and started on Prednisone for URI). She was previously in the ED 2x in 08/2014.  At last visit, we started a basal-bolus insulin as sugars were much worse >> we started: - Lantus 14 units at bedtime - Novolog 6-6.5 units per meal  We changed on Humulin 70/30 pen as she is in the donut hole - she did not start as she had plenty of Lantus. - 20 units before b'fast (30 min before)  - 14 units before dinner (30 min before)  Pt was on a regimen of: - Glipizide XL >> ineffective - Metformin and metformin extended-release >> diarrhea, incontinence - Bydureon >> cost prohibitive - Victoza >> this worked in the past, but pushes her earlier in the donut hole (she liked this, though)  Pt checks her sugars 2-3x a day and they are (per meter download): - am (~ 5 am): 141-180 (8 am) 135, 163 - 233 >> 160-230 - 2h after b'fast: n/c >> 311-413 >> 155-182 - before lunch: 99, 116-223 >> 217-444 >> 190-444 >> 126, 150-212 - 2h after lunch: 170s >> 193-525 >> 163-208, 367 - before dinner: 119-203 >> 167-473 >> 192-239, 326 - 2h after dinner: 117 >> 235-345 >> n/c - bedtime: n/c - nighttime: n/c No lows. Lowest sugar was 108 >> 126; she has hypoglycemia awareness at 80  Highest sugar was 300s.  Glucometer: One Touch Ultra Mini  Pt's meals are: - Breakfast: 3 PB crackers (nabs) - Lunch: meat + veggies + creamed potatoes, strawberry pie - Dinner: PB banana  sandwich - Snacks: snack  - + mild CKD, last BUN/creatinine:  Lab Results  Component Value Date   BUN 30* 10/25/2014   CREATININE 0.91 10/25/2014  On Losartan. - last set of lipids: Lab Results  Component Value Date   CHOL 221* 04/02/2014   HDL 60.00 04/02/2014   LDLCALC 128* 04/02/2014   LDLDIRECT 150.9 04/11/2013   TRIG 167.0* 04/02/2014   CHOLHDL 4 04/02/2014  She was on Zocor >> stopped long time ago. - last eye exam was in 2013. No DR.  - no numbness and tingling in her feet.  She has a h/o Non Hodgkin Lymphoma, COPD, HL, Osteoporosis (she stopped Alendronate b/c somnolence).  ROS: Constitutional: no weight loss, + fatigue, + feeling cold Eyes: no blurry vision, no xerophthalmia ENT: no sore throat, no nodules palpated in throat, no dysphagia/odynophagia, no hoarseness Cardiovascular: no CP/+ SOB/no palpitations/+ leg swelling Respiratory: + cough/+ SOB/+ wheezing Gastrointestinal: no N/V/D/C/heartburn Musculoskeletal: no muscle/joint aches Skin: no rashes, + easy bruising Neurological: + occasional tremors/no numbness/tingling/dizziness, + HA  I reviewed pt's medications, allergies, PMH, social hx, family hx, and changes were documented in the history of present illness. Otherwise, unchanged from my initial visit note. Started Doxycycline.   Past Medical History  Diagnosis Date  . Benign tumor of breast 2004    left  . Broken ribs     history of several broken ribs on  right and compression  fx  . Fibroma of skin     Multiple fibromas/lipomas on arms and legs B  . Anxiety   . COPD (chronic obstructive pulmonary disease) ([place])   . Hyperlipidemia   . History of CT scan of abdomen 05/23/2003    Mild fatty liver inf, tiny hypodensity in right lobe of liver  . Pulmonary nodules   . Diabetes mellitus without complication ([place])   . Hx of radiation therapy 04/11/12- 05/08/12    left chest wall area, 40 gray 20 fx  . Hypertension   . Diverticulosis   . Adenomatous  colon polyp   . Cancer ([place])     non-hodgkins b cell lymphoma  . 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 Depression   . Pyelonephritis   . Kidney stone 02/21/1981    Right/removed  . GERD (gastroesophageal reflux disease)   . Osteoarthritis     hands, degenerative back , osteoporosis   . Cold virus     seen by Dr. Deborra Medina on 10/12, given Rx for antibiotic & steroid  . CHF (congestive heart failure) ([place])     hosp. for CHF- 10/2013  . Shortness of breath     only with movemnent   . C. difficile colitis 06/21/2014  . UTI (lower urinary tract infection)   . Palpitations   . Tremor   . Weight loss     "multifactorial" per PMD note  . Chronic abdominal pain   . Gait instability    Past Surgical History  Procedure Laterality Date  . Appendectomy  02/21/1961  . Exploratory laparotomy  02/21/1970    for ? adhesions  . Abdominal hysterectomy  02/21/1970    right ovary remaining  . Bone marrow biopsy  03/09/2012  . Lung core biopsy  02/06/2012  . Tonsillectomy    . Breast surgery  1970's    lump removed- benign   . Tubal ligation    . Eye surgery      bilateral cataracts removed - /W- IOL  . Cholecystectomy N/A 12/11/2013    Procedure: LAPAROSCOPIC CHOLECYSTECTOMY WITH INTRAOPERATIVE CHOLANGIOGRAM;  Surgeon: Erroll Luna, MD;  Location: [facility];  Service: General;  Laterality: N/A;   History   Social History  . Marital Status: Widowed    Spouse Name: N/A  . Number of Children: 4   Occupational History  . Retired     Investment banker, operational   Social History Main Topics  . Smoking status: Current Every Day Smoker -- 0.50 packs/day for 30 years    Types: Cigarettes  . Smokeless tobacco: Never Used     Comment: 3/4 pack X 50 years  . Alcohol Use: No  . Drug Use: No  . Sexual Activity: Not on file     Comment: pt. stated, " I started smoking again."   Current Outpatient Prescriptions on File Prior to Visit   Medication Sig Dispense Refill  . albuterol (PROVENTIL HFA;VENTOLIN HFA) 108 (90 BASE) MCG/ACT inhaler Inhale 2 puffs into the lungs every 6 (six) hours as needed for wheezing or shortness of breath.    . ALPRAZolam (XANAX) 0.5 MG tablet Take 1 tablet (0.5 mg total) by mouth 3 (three) times daily as needed for anxiety. 90 tablet 0  . aspirin EC 81 MG tablet Take 81 mg by mouth every morning.    . Blood Glucose Monitoring Suppl ([place]) W/DEVICE KIT Use as directed  to test blood sugar three times daily Dx:R73.09 1 each 0  . citalopram (CELEXA) 40 MG tablet Take 1 tablet (40 mg total) by mouth daily. 90 tablet 3  . glucose blood test strip Use as instructed 100 each 6  . insulin aspart (NOVOLOG FLEXPEN) 100 UNIT/ML FlexPen Inject 4-6 Units into the skin 3 (three) times daily with meals. (Patient taking differently: Inject 6 Units into the skin 3 (three) times daily with meals. ) 15 mL 1  . Insulin Pen Needle 32G X 4 MM MISC Use to inject 4x daily. 200 each 1  . losartan (COZAAR) 50 MG tablet Take 1/2 tablet daily for 4 days and then increase it to one whole tablet daily as previously prescribed. (Patient taking differently: Take 50 mg by mouth daily. ) 30 tablet 3  . metoprolol succinate (TOPROL XL) 50 MG 24 hr tablet Take 1 tablet (50 mg total) by mouth daily. Take with or immediately following a meal. 30 tablet 3  . ONE TOUCH LANCETS MISC Use as directed to test blood sugar three times daily Dx:R73.09 300 each 2  . vitamin B-12 (CYANOCOBALAMIN) 500 MCG tablet Take 500 mcg by mouth daily.    . Insulin Glargine (LANTUS SOLOSTAR) 100 UNIT/ML Solostar Pen Inject 10 Units into the skin daily before breakfast. (Patient not taking: Reported on 12/23/2014) 5 pen 1   No current facility-administered medications on file prior to visit.   Allergies  Allergen Reactions  . Sulfonamide Derivatives Anaphylaxis  . Metformin And Related Diarrhea   Family History  Problem Relation Age of Onset   . Parkinsonism Brother   . Stroke Father   . Cancer Sister   . Heart disease Brother   . Clotting disorder Father   . Kidney disease Mother    PE: BP 118/64 mmHg  Pulse 86  Temp(Src) 97.5 F (36.4 C) (Oral)  Resp 16  Wt 118 lb (53.524 kg)  SpO2 93% Body mass index is 22.31 kg/(m^2). Wt Readings from Last 3 Encounters:  12/23/14 118 lb (53.524 kg)  11/27/14 116 lb 4 oz (52.731 kg)  11/11/14 111 lb 6.4 oz (50.531 kg)   Constitutional: thin, in NAD Eyes: PERRLA, EOMI, no exophthalmos ENT: moist mucous membranes, no thyromegaly, no cervical lymphadenopathy Cardiovascular: RRR, No MRG Respiratory: CTA B Gastrointestinal: abdomen soft, NT, ND, BS+ Musculoskeletal: no deformities, strength intact in all 4 Skin: moist, warm, no rashes Neurological: + head tremor and with with outstretched hands, DTR normal in all 4  ASSESSMENT: 1. DM2, non-insulin-dependent, uncontrolled, without complications  We tested her for DM1 >> negative: Component     Latest Ref Rng 06/10/2014  Glucose     70 - 99 mg/dL 136 (H)  C-Peptide     0.80 - 3.90 ng/mL 3.17  Glutamic Acid Decarb Ab     <5 IU/mL <5  Pancreatic Islet Cell Antibody     < 5 JDF Units <5   PLAN:  1. Patient with long-standing, reasonably controlled diabetes until recently >> now very high sugars >> necessitated to start insulin at last visit. She is doing better on this, but stopped basal insulin as she thought this was causing her cough. Will need to restart. She still has cough, not improved after stopping Lantus. - I suggested to:  Patient Instructions  Please restart Lantus - take 10 units tonight at bedtime, then increase to 14 units at bedtime. If sugars in am are still high, in 1 week, increase to 18 units.  Please continue  Novolog: - 6-6.5 units before a meal Can use 8 units for a large meal.  Please return in 1.5 months with your sugar log.   - continue checking sugars at different times of the day - check 3 times  a day, rotating checks - advised for yearly eye exams >> needs one! - checked HbA1c today >> 8.2% (higher!) - Return to clinic in 1.5 mo with sugar log

## 2014-12-24 ENCOUNTER — Observation Stay (HOSPITAL_COMMUNITY)
Admission: EM | Admit: 2014-12-24 | Discharge: 2014-12-25 | Disposition: A | Discharge status: Home / Self Care | Payer: PPO | Attending: Internal Medicine | Admitting: Internal Medicine

## 2014-12-24 ENCOUNTER — Encounter (HOSPITAL_COMMUNITY): Payer: Self-pay

## 2014-12-24 ENCOUNTER — Emergency Department (HOSPITAL_COMMUNITY): Payer: PPO

## 2014-12-24 DIAGNOSIS — Z8619 Personal history of other infectious and parasitic diseases: Secondary | ICD-10-CM | POA: Diagnosis not present

## 2014-12-24 DIAGNOSIS — E785 Hyperlipidemia, unspecified: Secondary | ICD-10-CM | POA: Diagnosis not present

## 2014-12-24 DIAGNOSIS — K5790 Diverticulosis of intestine, part unspecified, without perforation or abscess without bleeding: Secondary | ICD-10-CM | POA: Diagnosis not present

## 2014-12-24 DIAGNOSIS — J069 Acute upper respiratory infection, unspecified: Secondary | ICD-10-CM

## 2014-12-24 DIAGNOSIS — N12 Tubulo-interstitial nephritis, not specified as acute or chronic: Secondary | ICD-10-CM | POA: Insufficient documentation

## 2014-12-24 DIAGNOSIS — K219 Gastro-esophageal reflux disease without esophagitis: Secondary | ICD-10-CM | POA: Insufficient documentation

## 2014-12-24 DIAGNOSIS — F419 Anxiety disorder, unspecified: Secondary | ICD-10-CM | POA: Insufficient documentation

## 2014-12-24 DIAGNOSIS — E119 Type 2 diabetes mellitus without complications: Secondary | ICD-10-CM | POA: Diagnosis not present

## 2014-12-24 DIAGNOSIS — F329 Major depressive disorder, single episode, unspecified: Secondary | ICD-10-CM | POA: Diagnosis not present

## 2014-12-24 DIAGNOSIS — J441 Chronic obstructive pulmonary disease with (acute) exacerbation: Principal | ICD-10-CM | POA: Insufficient documentation

## 2014-12-24 DIAGNOSIS — Z794 Long term (current) use of insulin: Secondary | ICD-10-CM | POA: Diagnosis not present

## 2014-12-24 DIAGNOSIS — Z720 Tobacco use: Secondary | ICD-10-CM | POA: Diagnosis not present

## 2014-12-24 DIAGNOSIS — N200 Calculus of kidney: Secondary | ICD-10-CM | POA: Insufficient documentation

## 2014-12-24 DIAGNOSIS — Z7982 Long term (current) use of aspirin: Secondary | ICD-10-CM | POA: Diagnosis not present

## 2014-12-24 DIAGNOSIS — Z86018 Personal history of other benign neoplasm: Secondary | ICD-10-CM | POA: Diagnosis not present

## 2014-12-24 DIAGNOSIS — I10 Essential (primary) hypertension: Secondary | ICD-10-CM | POA: Diagnosis present

## 2014-12-24 DIAGNOSIS — M1990 Unspecified osteoarthritis, unspecified site: Secondary | ICD-10-CM | POA: Insufficient documentation

## 2014-12-24 DIAGNOSIS — J9601 Acute respiratory failure with hypoxia: Secondary | ICD-10-CM | POA: Diagnosis not present

## 2014-12-24 DIAGNOSIS — Z79899 Other long term (current) drug therapy: Secondary | ICD-10-CM | POA: Diagnosis not present

## 2014-12-24 DIAGNOSIS — Z85118 Personal history of other malignant neoplasm of bronchus and lung: Secondary | ICD-10-CM | POA: Insufficient documentation

## 2014-12-24 DIAGNOSIS — R05 Cough: Secondary | ICD-10-CM | POA: Diagnosis present

## 2014-12-24 LAB — CBC WITH DIFFERENTIAL/PLATELET
BASOS ABS: 0 10*3/uL (ref 0.0–0.1)
Basophils Relative: 0 %
EOS PCT: 3 %
Eosinophils Absolute: 0.3 10*3/uL (ref 0.0–0.7)
HEMATOCRIT: 42.1 % (ref 36.0–46.0)
HEMOGLOBIN: 14.1 g/dL (ref 12.0–15.0)
LYMPHS ABS: 2.5 10*3/uL (ref 0.7–4.0)
LYMPHS PCT: 25 %
MCH: 31.3 pg (ref 26.0–34.0)
MCHC: 33.5 g/dL (ref 30.0–36.0)
MCV: 93.3 fL (ref 78.0–100.0)
Monocytes Absolute: 1.1 10*3/uL — ABNORMAL HIGH (ref 0.1–1.0)
Monocytes Relative: 11 %
NEUTROS ABS: 6 10*3/uL (ref 1.7–7.7)
Neutrophils Relative %: 61 %
PLATELETS: 319 10*3/uL (ref 150–400)
RBC: 4.51 MIL/uL (ref 3.87–5.11)
RDW: 13.3 % (ref 11.5–15.5)
WBC: 9.9 10*3/uL (ref 4.0–10.5)

## 2014-12-24 LAB — BASIC METABOLIC PANEL
ANION GAP: 8 (ref 5–15)
BUN: 13 mg/dL (ref 6–20)
CHLORIDE: 100 mmol/L — AB (ref 101–111)
CO2: 30 mmol/L (ref 22–32)
Calcium: 9.4 mg/dL (ref 8.9–10.3)
Creatinine, Ser: 0.8 mg/dL (ref 0.44–1.00)
GFR calc Af Amer: >60 mL/min (ref >60)
GFR calc non Af Amer: >60 mL/min (ref >60)
GLUCOSE: 282 mg/dL — AB (ref 65–99)
POTASSIUM: 4 mmol/L (ref 3.5–5.1)
Sodium: 138 mmol/L (ref 135–145)

## 2014-12-24 LAB — GLUCOSE, CAPILLARY: GLUCOSE-CAPILLARY: 455 mg/dL — AB (ref 65–99)

## 2014-12-24 LAB — TROPONIN I: Troponin I: <0.03 ng/mL (ref <0.031)

## 2014-12-24 LAB — GLUCOSE, RANDOM: Glucose, Bld: 441 mg/dL — ABNORMAL HIGH (ref 65–99)

## 2014-12-24 LAB — RAPID STREP SCREEN (MED CTR MEBANE ONLY): Streptococcus, Group A Screen (Direct): NEGATIVE

## 2014-12-24 MED ORDER — ACETAMINOPHEN 650 MG RE SUPP
650.0000 mg | Freq: Four times a day (QID) | RECTAL | Status: DC | PRN
Start: 2014-12-24 — End: 2014-12-25

## 2014-12-24 MED ORDER — LEVOFLOXACIN 750 MG PO TABS
750.0000 mg | ORAL_TABLET | Freq: Every day | ORAL | Status: DC
Start: 2014-12-24 — End: 2014-12-25
  Administered 2014-12-24 – 2014-12-25 (×2): 750 mg via ORAL
  Filled 2014-12-24 (×2): qty 1

## 2014-12-24 MED ORDER — SENNOSIDES-DOCUSATE SODIUM 8.6-50 MG PO TABS: 1.0000 | ORAL_TABLET | Freq: Every evening | ORAL | Status: DC | PRN

## 2014-12-24 MED ORDER — PREDNISONE 20 MG PO TABS
50.0000 mg | ORAL_TABLET | Freq: Every day | ORAL | Status: DC
  Administered 2014-12-25: 50 mg via ORAL
  Filled 2014-12-24: qty 2
  Filled 2014-12-24: qty 1

## 2014-12-24 MED ORDER — LOSARTAN POTASSIUM 50 MG PO TABS
50.0000 mg | ORAL_TABLET | Freq: Every day | ORAL | Status: DC
  Administered 2014-12-25: 50 mg via ORAL
  Filled 2014-12-24: qty 1

## 2014-12-24 MED ORDER — SODIUM CHLORIDE 0.9 % IV BOLUS (SEPSIS)
500.0000 mL | Freq: Once | INTRAVENOUS | Status: AC
  Administered 2014-12-24: 500 mL via INTRAVENOUS

## 2014-12-24 MED ORDER — INSULIN ASPART 100 UNIT/ML ~~LOC~~ SOLN
0.0000 [IU] | Freq: Every day | SUBCUTANEOUS | Status: DC
  Administered 2014-12-24: 3 [IU] via SUBCUTANEOUS

## 2014-12-24 MED ORDER — ALBUTEROL (5 MG/ML) CONTINUOUS INHALATION SOLN
10.0000 mg/h | INHALATION_SOLUTION | Freq: Once | RESPIRATORY_TRACT | Status: AC
  Administered 2014-12-24: 10 mg/h via RESPIRATORY_TRACT
  Filled 2014-12-24: qty 20

## 2014-12-24 MED ORDER — ACETAMINOPHEN 325 MG PO TABS
650.0000 mg | ORAL_TABLET | Freq: Once | ORAL | Status: AC
  Administered 2014-12-24: 650 mg via ORAL
  Filled 2014-12-24: qty 2

## 2014-12-24 MED ORDER — GUAIFENESIN 100 MG/5ML PO SOLN
5.0000 mL | ORAL | Status: DC | PRN
  Administered 2014-12-25: 100 mg via ORAL
  Filled 2014-12-24: qty 5

## 2014-12-24 MED ORDER — ENOXAPARIN SODIUM 40 MG/0.4ML ~~LOC~~ SOLN
40.0000 mg | SUBCUTANEOUS | Status: DC
  Administered 2014-12-24: 40 mg via SUBCUTANEOUS
  Filled 2014-12-24: qty 0.4

## 2014-12-24 MED ORDER — IPRATROPIUM-ALBUTEROL 0.5-2.5 (3) MG/3ML IN SOLN
RESPIRATORY_TRACT | Status: AC
  Filled 2014-12-24: qty 3

## 2014-12-24 MED ORDER — GUAIFENESIN ER 600 MG PO TB12
1200.0000 mg | ORAL_TABLET | Freq: Two times a day (BID) | ORAL | Status: DC
  Administered 2014-12-24 – 2014-12-25 (×2): 1200 mg via ORAL
  Filled 2014-12-24 (×2): qty 2

## 2014-12-24 MED ORDER — IPRATROPIUM BROMIDE 0.02 % IN SOLN
1.0000 mg | Freq: Once | RESPIRATORY_TRACT | Status: AC
  Administered 2014-12-24: 1 mg via RESPIRATORY_TRACT
  Filled 2014-12-24: qty 5

## 2014-12-24 MED ORDER — ONDANSETRON HCL 4 MG/2ML IJ SOLN: 4.0000 mg | Freq: Four times a day (QID) | INTRAMUSCULAR | Status: DC | PRN

## 2014-12-24 MED ORDER — IPRATROPIUM BROMIDE 0.02 % IN SOLN
0.5000 mg | Freq: Four times a day (QID) | RESPIRATORY_TRACT | Status: DC
  Administered 2014-12-24: 0.5 mg via RESPIRATORY_TRACT
  Filled 2014-12-24: qty 2.5

## 2014-12-24 MED ORDER — SODIUM CHLORIDE 0.9 % IV SOLN
INTRAVENOUS | Status: DC
  Administered 2014-12-24: 16:00:00 via INTRAVENOUS

## 2014-12-24 MED ORDER — OXYCODONE HCL 5 MG PO TABS
5.0000 mg | ORAL_TABLET | ORAL | Status: DC | PRN
  Administered 2014-12-24 – 2014-12-25 (×5): 5 mg via ORAL
  Filled 2014-12-24 (×5): qty 1

## 2014-12-24 MED ORDER — INSULIN ASPART 100 UNIT/ML ~~LOC~~ SOLN
6.0000 [IU] | Freq: Three times a day (TID) | SUBCUTANEOUS | Status: DC
  Administered 2014-12-25: 6 [IU] via SUBCUTANEOUS

## 2014-12-24 MED ORDER — INSULIN GLARGINE 100 UNIT/ML SOLOSTAR PEN: 14.0000 [IU] | PEN_INJECTOR | Freq: Every day | SUBCUTANEOUS | Status: DC

## 2014-12-24 MED ORDER — ALBUTEROL SULFATE (2.5 MG/3ML) 0.083% IN NEBU
2.5000 mg | INHALATION_SOLUTION | Freq: Four times a day (QID) | RESPIRATORY_TRACT | Status: DC
  Administered 2014-12-24: 2.5 mg via RESPIRATORY_TRACT
  Filled 2014-12-24: qty 3

## 2014-12-24 MED ORDER — INSULIN ASPART 100 UNIT/ML ~~LOC~~ SOLN
0.0000 [IU] | Freq: Three times a day (TID) | SUBCUTANEOUS | Status: DC
  Administered 2014-12-25: 4 [IU] via SUBCUTANEOUS

## 2014-12-24 MED ORDER — ASPIRIN EC 81 MG PO TBEC
81.0000 mg | DELAYED_RELEASE_TABLET | Freq: Every morning | ORAL | Status: DC
  Administered 2014-12-25: 81 mg via ORAL
  Filled 2014-12-24: qty 1

## 2014-12-24 MED ORDER — IPRATROPIUM-ALBUTEROL 0.5-2.5 (3) MG/3ML IN SOLN
3.0000 mL | Freq: Four times a day (QID) | RESPIRATORY_TRACT | Status: DC
  Administered 2014-12-25 (×3): 3 mL via RESPIRATORY_TRACT
  Filled 2014-12-24 (×4): qty 3

## 2014-12-24 MED ORDER — ONDANSETRON HCL 4 MG PO TABS: 4.0000 mg | ORAL_TABLET | Freq: Four times a day (QID) | ORAL | Status: DC | PRN

## 2014-12-24 MED ORDER — INSULIN GLARGINE 100 UNIT/ML ~~LOC~~ SOLN
14.0000 [IU] | Freq: Every day | SUBCUTANEOUS | Status: DC
  Administered 2014-12-25: 14 [IU] via SUBCUTANEOUS
  Filled 2014-12-24 (×2): qty 0.14

## 2014-12-24 MED ORDER — ACETAMINOPHEN 325 MG PO TABS: 650.0000 mg | ORAL_TABLET | Freq: Four times a day (QID) | ORAL | Status: DC | PRN

## 2014-12-24 MED ORDER — ALBUTEROL SULFATE (2.5 MG/3ML) 0.083% IN NEBU: 2.5000 mg | INHALATION_SOLUTION | Freq: Four times a day (QID) | RESPIRATORY_TRACT | Status: DC

## 2014-12-24 MED ORDER — ALBUTEROL SULFATE (2.5 MG/3ML) 0.083% IN NEBU: 2.5000 mg | INHALATION_SOLUTION | RESPIRATORY_TRACT | Status: DC

## 2014-12-24 MED ORDER — ALBUTEROL SULFATE (2.5 MG/3ML) 0.083% IN NEBU: 2.5000 mg | INHALATION_SOLUTION | RESPIRATORY_TRACT | Status: DC | PRN

## 2014-12-24 MED ORDER — METOPROLOL SUCCINATE ER 50 MG PO TB24
50.0000 mg | ORAL_TABLET | Freq: Every day | ORAL | Status: DC
  Administered 2014-12-25: 50 mg via ORAL
  Filled 2014-12-24 (×2): qty 1

## 2014-12-24 MED ORDER — ALPRAZOLAM 0.5 MG PO TABS
0.5000 mg | ORAL_TABLET | Freq: Three times a day (TID) | ORAL | Status: DC | PRN
  Administered 2014-12-24 – 2014-12-25 (×3): 0.5 mg via ORAL
  Filled 2014-12-24 (×3): qty 1

## 2014-12-24 MED ORDER — METHYLPREDNISOLONE SODIUM SUCC 125 MG IJ SOLR
125.0000 mg | Freq: Once | INTRAMUSCULAR | Status: AC
  Administered 2014-12-24: 125 mg via INTRAVENOUS
  Filled 2014-12-24: qty 2

## 2014-12-24 MED ORDER — IPRATROPIUM BROMIDE 0.02 % IN SOLN: 0.5000 mg | Freq: Four times a day (QID) | RESPIRATORY_TRACT | Status: DC

## 2014-12-24 MED ORDER — INSULIN ASPART 100 UNIT/ML ~~LOC~~ SOLN
20.0000 [IU] | Freq: Once | SUBCUTANEOUS | Status: AC
  Administered 2014-12-24: 20 [IU] via SUBCUTANEOUS

## 2014-12-24 MED ORDER — INSULIN GLARGINE 100 UNIT/ML ~~LOC~~ SOLN
14.0000 [IU] | Freq: Once | SUBCUTANEOUS | Status: AC
  Administered 2014-12-24: 14 [IU] via SUBCUTANEOUS
  Filled 2014-12-24: qty 0.14

## 2014-12-24 MED ORDER — IPRATROPIUM-ALBUTEROL 0.5-2.5 (3) MG/3ML IN SOLN
3.0000 mL | Freq: Once | RESPIRATORY_TRACT | Status: AC
  Administered 2014-12-24: 3 mL via RESPIRATORY_TRACT

## 2014-12-24 MED ORDER — CITALOPRAM HYDROBROMIDE 20 MG PO TABS
40.0000 mg | ORAL_TABLET | Freq: Every day | ORAL | Status: DC
  Administered 2014-12-24 – 2014-12-25 (×2): 40 mg via ORAL
  Filled 2014-12-24 (×2): qty 2

## 2014-12-24 NOTE — ED Notes (Signed)
Pt reports cough and sob x 5 weeks.  Reports has been on 2 rounds of antibiotics, 2 steroid shots, and prednisone.  Reports has been getting worse.  Pt says SOB significantly worse today.  Reports had pneumonia 2 months ago.

## 2014-12-24 NOTE — ED Notes (Signed)
Patient ambulated in hallway.  o2 sat maintained at 90-93%.  Patient stated "she just feels weak from being so sick". Patient able to walk without assistance.

## 2014-12-24 NOTE — Progress Notes (Addendum)
Pt CBG 455. MD notified and made aware. Received verbal instructions to give 20 units novolog now and Lantus 14 units. tonight

## 2014-12-24 NOTE — ED Notes (Signed)
After using BSC and sitting on side of bed eating, O2 dropped to 89%. Applied 2L [place] and went up to 94%. Lattie Haw, RN updated.

## 2014-12-24 NOTE — ED Provider Notes (Signed)
CSN: [id number]     Arrival date & time 12/24/14  0347 History   First MD Initiated Contact with Patient 12/24/14 [phone]     Chief Complaint  Patient presents with  . Cough     HPI Pt was seen at 0930.  Per pt, c/o gradual onset and worsening of persistent cough, wheezing and SOB for the past 5 weeks, worse over the past several days. Has been using home MDI with transient relief. Pt states she has also been on several rounds of antibiotics and steroids.  Pt continues to smoke cigarettes. Denies CP/palpitations, no back pain, no abd pain, no N/V/D, no fevers, no rash.    Past Medical History  Diagnosis Date  . Benign tumor of breast 2004    left  . Broken ribs     history of several broken ribs on right and compression  fx  . Fibroma of skin     Multiple fibromas/lipomas on arms and legs B  . Anxiety   . COPD (chronic obstructive pulmonary disease) ([place])   . Hyperlipidemia   . History of CT scan of abdomen 05/23/2003    Mild fatty liver inf, tiny hypodensity in right lobe of liver  . Pulmonary nodules   . Diabetes mellitus without complication ([place])   . Hx of radiation therapy 04/11/12- 05/08/12    left chest wall area, 40 gray 20 fx  . Hypertension   . Diverticulosis   . Adenomatous colon polyp   . Cancer ([place])     non-hodgkins b cell lymphoma  . 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 Depression   . Pyelonephritis   . Kidney stone 02/21/1981    Right/removed  . GERD (gastroesophageal reflux disease)   . Osteoarthritis     hands, degenerative back , osteoporosis   . Cold virus     seen by Dr. Deborra Medina on 10/12, given Rx for antibiotic & steroid  . CHF (congestive heart failure) ([place])     hosp. for CHF- 10/2013  . Shortness of breath     only with movemnent   . C. difficile colitis 06/21/2014  . UTI (lower urinary tract infection)   . Palpitations   . Tremor   . Weight loss     "multifactorial" per PMD note  . Chronic  abdominal pain   . Gait instability    Past Surgical History  Procedure Laterality Date  . Appendectomy  02/21/1961  . Exploratory laparotomy  02/21/1970    for ? adhesions  . Abdominal hysterectomy  02/21/1970    right ovary remaining  . Bone marrow biopsy  03/09/2012  . Lung core biopsy  02/06/2012  . Tonsillectomy    . Breast surgery  1970's    lump removed- benign   . Tubal ligation    . Eye surgery      bilateral cataracts removed - /W- IOL  . Cholecystectomy N/A 12/11/2013    Procedure: LAPAROSCOPIC CHOLECYSTECTOMY WITH INTRAOPERATIVE CHOLANGIOGRAM;  Surgeon: Erroll Luna, MD;  Location: [facility];  Service: General;  Laterality: N/A;   Family History  Problem Relation Age of Onset  . Parkinsonism Brother   . Stroke Father   . Cancer Sister   . Heart disease Brother   . Clotting disorder Father   . Kidney disease Mother    Social History  Substance Use Topics  . Smoking status: Current Every Day Smoker -- 0.50 packs/day for 30 years  Types: Cigarettes  . Smokeless tobacco: Never Used     Comment: 3/4 pack X 50 years  . Alcohol Use: No    Review of Systems ROS: Statement: All systems negative except as marked or noted in the HPI; Constitutional: Negative for fever and chills. ; ; Eyes: Negative for eye pain, redness and discharge. ; ; ENMT: Negative for ear pain, hoarseness, nasal congestion, sinus pressure and sore throat. ; ; Cardiovascular: Negative for chest pain, palpitations, diaphoresis, and peripheral edema. ; ; Respiratory: +SOB, cough, wheezing. Negative for stridor. ; ; Gastrointestinal: Negative for nausea, vomiting, diarrhea, abdominal pain, blood in stool, hematemesis, jaundice and rectal bleeding. . ; ; Genitourinary: Negative for dysuria, flank pain and hematuria. ; ; Musculoskeletal: Negative for back pain and neck pain. Negative for swelling and trauma.; ; Skin: Negative for pruritus, rash, abrasions, blisters, bruising and skin lesion.; ; Neuro: Negative  for headache, lightheadedness and neck stiffness. Negative for weakness, altered level of consciousness , altered mental status, extremity weakness, paresthesias, involuntary movement, seizure and syncope.      Allergies  Sulfonamide derivatives and Metformin and related  Home Medications   Prior to Admission medications   Medication Sig Start Date End Date Taking? Authorizing Provider  albuterol (PROVENTIL HFA;VENTOLIN HFA) 108 (90 BASE) MCG/ACT inhaler Inhale 2 puffs into the lungs every 6 (six) hours as needed for wheezing or shortness of breath.    Historical Provider, MD  ALPRAZolam Duanne Moron) 0.5 MG tablet Take 1 tablet (0.5 mg total) by mouth 3 (three) times daily as needed for anxiety. 12/11/14   Lucille Passy, MD  aspirin EC 81 MG tablet Take 81 mg by mouth every morning.    Historical Provider, MD  Blood Glucose Monitoring Suppl (ONE TOUCH BASIC SYSTEM) W/DEVICE KIT Use as directed to test blood sugar three times daily Dx:R73.09 05/01/14   Lucille Passy, MD  citalopram (CELEXA) 40 MG tablet Take 1 tablet (40 mg total) by mouth daily. 02/27/14   Lucille Passy, MD  glucose blood test strip Use as instructed 05/01/14   Lucille Passy, MD  insulin aspart (NOVOLOG FLEXPEN) 100 UNIT/ML FlexPen Inject 4-6 Units into the skin 3 (three) times daily with meals. Patient taking differently: Inject 6 Units into the skin 3 (three) times daily with meals.  11/11/14   Philemon Kingdom, MD  Insulin Glargine (LANTUS SOLOSTAR) 100 UNIT/ML Solostar Pen Inject 14 Units into the skin daily before breakfast. 12/23/14   Philemon Kingdom, MD  Insulin Pen Needle 32G X 4 MM MISC Use to inject 4x daily. Novofine. 12/23/14   Philemon Kingdom, MD  losartan (COZAAR) 50 MG tablet Take 1/2 tablet daily for 4 days and then increase it to one whole tablet daily as previously prescribed. Patient taking differently: Take 50 mg by mouth daily.  07/22/14   Lucille Passy, MD  metoprolol succinate (TOPROL XL) 50 MG 24 hr tablet Take 1  tablet (50 mg total) by mouth daily. Take with or immediately following a meal. 09/15/14   Lucille Passy, MD  ONE [facility] LANCETS MISC Use as directed to test blood sugar three times daily Dx:R73.09 05/01/14   Lucille Passy, MD  vitamin B-12 (CYANOCOBALAMIN) 500 MCG tablet Take 500 mcg by mouth daily.    Historical Provider, MD   BP 161/69 mmHg  Pulse 105  Temp(Src) 98.1 F (36.7 C) (Oral)  Resp 26  Ht 5' 1"  (1.549 m)  Wt 115 lb (52.164 kg)  BMI 21.74 kg/m2  SpO2 100% Physical Exam  0935: Physical examination:  Nursing notes reviewed; Vital signs and O2 SAT reviewed;  Constitutional: Well developed, Well nourished, Well hydrated, Uncomfortable appearing.; Head:  Normocephalic, atraumatic; Eyes: EOMI, PERRL, No scleral icterus; ENMT: Mouth and pharynx normal, Mucous membranes moist; Neck: Supple, Full range of motion, No lymphadenopathy; Cardiovascular: Tachycardic rate and rhythm, No gallop; Respiratory: Breath sounds diminished & equal bilaterally, scattered wheezes.  No audible wheezing. +non-productive cough. Speaking words. Sitting upright, tachypneic.; Chest: Nontender, Movement normal; Abdomen: Soft, Nontender, Nondistended, Normal bowel sounds; Genitourinary: No CVA tenderness; Extremities: Pulses normal, No tenderness, No edema, No calf edema or asymmetry.; Neuro: AA&Ox3, Major CN grossly intact.  Speech clear. No gross focal motor deficits in extremities.; Skin: Color normal, Warm, Dry.    ED Course  Procedures (including critical care time) Labs Review   Imaging Review  I have personally reviewed and evaluated these images and lab results as part of my medical decision-making.   EKG Interpretation   Date/Time:  Wednesday December 24 2014 09:22:55 EDT Ventricular Rate:  94 PR Interval:  117 QRS Duration: 70 QT Interval:  359 QTC Calculation: 449 R Axis:   74 Text Interpretation:  Sinus rhythm Borderline short PR interval Baseline  wander When compared with ECG of 08/25/2014 No  significant change was found  Confirmed by [facility]  MD, Nunzio Cory [id number]) on 12/24/2014 9:38:32 AM      MDM  MDM Reviewed: previous chart, nursing note and vitals Reviewed previous: labs and ECG Interpretation: labs, ECG and x-ray Total time providing critical care: 30-74 minutes. This excludes time spent performing separately reportable procedures and services. Consults: admitting MD   CRITICAL CARE Performed by: Alfonzo Feller Total critical care time: 35 minutes Critical care time was exclusive of separately billable procedures and treating other patients. Critical care was necessary to treat or prevent imminent or life-threatening deterioration. Critical care was time spent personally by me on the following activities: development of treatment plan with patient and/or surrogate as well as nursing, discussions with consultants, evaluation of patient's response to treatment, examination of patient, obtaining history from patient or surrogate, ordering and performing treatments and interventions, ordering and review of laboratory studies, ordering and review of radiographic studies, pulse oximetry and re-evaluation of patient's condition.   Results for orders placed or performed during the hospital encounter of 12/24/14  CBC with Differential  Result Value Ref Range   WBC 9.9 4.0 - 10.5 K/uL   RBC 4.51 3.87 - 5.11 MIL/uL   Hemoglobin 14.1 12.0 - 15.0 g/dL   HCT 42.1 36.0 - 46.0 %   MCV 93.3 78.0 - 100.0 fL   MCH 31.3 26.0 - 34.0 pg   MCHC 33.5 30.0 - 36.0 g/dL   RDW 13.3 11.5 - 15.5 %   Platelets 319 150 - 400 K/uL   Neutrophils Relative % 61 %   Neutro Abs 6.0 1.7 - 7.7 K/uL   Lymphocytes Relative 25 %   Lymphs Abs 2.5 0.7 - 4.0 K/uL   Monocytes Relative 11 %   Monocytes Absolute 1.1 (H) 0.1 - 1.0 K/uL   Eosinophils Relative 3 %   Eosinophils Absolute 0.3 0.0 - 0.7 K/uL   Basophils Relative 0 %   Basophils Absolute 0.0 0.0 - 0.1 K/uL  Basic metabolic panel  Result  Value Ref Range   Sodium 138 135 - 145 mmol/L   Potassium 4.0 3.5 - 5.1 mmol/L   Chloride 100 (L) 101 - 111 mmol/L   CO2 30 22 -  32 mmol/L   Glucose, Bld 282 (H) 65 - 99 mg/dL   BUN 13 6 - 20 mg/dL   Creatinine, Ser 0.80 0.44 - 1.00 mg/dL   Calcium 9.4 8.9 - 10.3 mg/dL   GFR calc non Af Amer >60 >60 mL/min   GFR calc Af Amer >60 >60 mL/min   Anion gap 8 5 - 15  Troponin I  Result Value Ref Range   Troponin I <0.03 <0.031 ng/mL   Dg Chest Port 1 View 12/24/2014  CLINICAL DATA:  Cough, wheezing EXAM: PORTABLE CHEST 1 VIEW COMPARISON:  11/27/2014 FINDINGS: Cardiomediastinal silhouette is stable. There is mild hyperinflation. No acute infiltrate or pleural effusion. No pulmonary edema. IMPRESSION: No active disease.  Hyperinflation again noted. Electronically Signed   By: Lahoma Crocker M.D.   On: 12/24/2014 10:22    1200:  On arrival: pt sitting upright, tachypneic, wheezing. IV solumedrol and hour long neb ordered. After neb: pt continues to have insp/exp wheezing bilat, Sats 93% R/A while sitting on stretcher, able to speak sentences now. Pt ambulated with Sats dropping to 90% R/A, HR 110's. Pt c/o "feeling weak" and SOB.  Dx and testing d/w pt and family.  Questions answered.  Verb understanding, agreeable to admit. T/C to [facility] Dr. Jerilee Hoh, case discussed, including:  HPI, pertinent PM/SHx, VS/PE, dx testing, ED course and treatment:  Agreeable to admit, requests to write temporary orders, obtain medical bed to team APAdmits.   Francine Graven, DO 12/28/14 2027

## 2014-12-24 NOTE — H&P (Signed)
[facility]          History and Physical    PCP:   Arnette Norris, MD   EDP: Francine Graven, D.O.  Chief Complaint:  Cough and shortness of breath  HPI: Patient is a 79 year old woman with history of COPD, continued tobacco abuse, hypertension, diabetes and other issues who presents to the hospital today with cough and shortness of breath that have been present for about 2 months but have worsened over the past 2 days. She states that she has visited her PCP on 2 separate occasions and has received 2 steroid shots as well as 2 separate courses of doxycycline which she feels have not helped. Overnight she got increasingly short of breath and this morning asked her daughter to bring her to the hospital for evaluation. She also complains of a pounding headache, runny nose, postnasal drip, cough that is worsened at night while lying down flat. In the emergency department she was found to be hypoxemic to 89% while ambulating,chest x-ray does not show any abnormalities. Patient states she has not had a fever but has had subjective chills. No sick contacts or recent travel. We have been asked to admit her for further evaluation and management.  Allergies:   Allergies  Allergen Reactions  . Sulfonamide Derivatives Anaphylaxis  . Metformin And Related Diarrhea      Past Medical History  Diagnosis Date  . Benign tumor of breast 2004    left  . Broken ribs     history of several broken ribs on right and compression  fx  . Fibroma of skin     Multiple fibromas/lipomas on arms and legs B  . Anxiety   . COPD (chronic obstructive pulmonary disease) ([place])   . Hyperlipidemia   . History of CT scan of abdomen 05/23/2003    Mild fatty liver inf, tiny hypodensity in right lobe of liver  . Pulmonary nodules   . Diabetes mellitus without complication ([place])   . Hx of radiation therapy 04/11/12- 05/08/12    left chest wall area, 40 gray 20 fx  . Hypertension   .  Diverticulosis   . Adenomatous colon polyp   . Cancer ([place])     non-hodgkins b cell lymphoma  . 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 Depression   . Pyelonephritis   . Kidney stone 02/21/1981    Right/removed  . GERD (gastroesophageal reflux disease)   . Osteoarthritis     hands, degenerative back , osteoporosis   . Cold virus     seen by Dr. Deborra Medina on 10/12, given Rx for antibiotic & steroid  . CHF (congestive heart failure) ([place])     hosp. for CHF- 10/2013  . Shortness of breath     only with movemnent   . C. difficile colitis 06/21/2014  . UTI (lower urinary tract infection)   . Palpitations   . Tremor   . Weight loss     "multifactorial" per PMD note  . Chronic abdominal pain   . Gait instability     Past Surgical History  Procedure Laterality Date  . Appendectomy  02/21/1961  . Exploratory laparotomy  02/21/1970    for ? adhesions  . Abdominal hysterectomy  02/21/1970    right ovary remaining  . Bone marrow biopsy  03/09/2012  . Lung core biopsy  02/06/2012  . Tonsillectomy    .  Breast surgery  1970's    lump removed- benign   . Tubal ligation    . Eye surgery      bilateral cataracts removed - /W- IOL  . Cholecystectomy N/A 12/11/2013    Procedure: LAPAROSCOPIC CHOLECYSTECTOMY WITH INTRAOPERATIVE CHOLANGIOGRAM;  Surgeon: Erroll Luna, MD;  Location: [facility];  Service: General;  Laterality: N/A;    Prior to Admission medications   Medication Sig Start Date End Date Taking? Authorizing Provider  albuterol (PROVENTIL HFA;VENTOLIN HFA) 108 (90 BASE) MCG/ACT inhaler Inhale 2 puffs into the lungs every 6 (six) hours as needed for wheezing or shortness of breath.   Yes Historical Provider, MD  ALPRAZolam Duanne Moron) 0.5 MG tablet Take 1 tablet (0.5 mg total) by mouth 3 (three) times daily as needed for anxiety. 12/11/14  Yes Lucille Passy, MD  aspirin EC 81 MG tablet Take 81 mg by mouth every morning.   Yes Historical  Provider, MD  Blood Glucose Monitoring Suppl ([place]) W/DEVICE KIT Use as directed to test blood sugar three times daily Dx:R73.09 05/01/14  Yes Lucille Passy, MD  citalopram (CELEXA) 40 MG tablet Take 1 tablet (40 mg total) by mouth daily. 02/27/14  Yes Lucille Passy, MD  glucose blood test strip Use as instructed 05/01/14  Yes Lucille Passy, MD  insulin aspart (NOVOLOG FLEXPEN) 100 UNIT/ML FlexPen Inject 4-6 Units into the skin 3 (three) times daily with meals. Patient taking differently: Inject 6 Units into the skin 3 (three) times daily with meals.  11/11/14  Yes Philemon Kingdom, MD  Insulin Glargine (LANTUS SOLOSTAR) 100 UNIT/ML Solostar Pen Inject 14 Units into the skin daily before breakfast. 12/23/14  Yes Philemon Kingdom, MD  Insulin Pen Needle 32G X 4 MM MISC Use to inject 4x daily. Novofine. 12/23/14  Yes Philemon Kingdom, MD  losartan (COZAAR) 50 MG tablet Take 1/2 tablet daily for 4 days and then increase it to one whole tablet daily as previously prescribed. Patient taking differently: Take 50 mg by mouth daily.  07/22/14  Yes Lucille Passy, MD  metoprolol succinate (TOPROL XL) 50 MG 24 hr tablet Take 1 tablet (50 mg total) by mouth daily. Take with or immediately following a meal. 09/15/14  Yes Lucille Passy, MD  ONE TOUCH LANCETS MISC Use as directed to test blood sugar three times daily Dx:R73.09 05/01/14  Yes Lucille Passy, MD  vitamin B-12 (CYANOCOBALAMIN) 500 MCG tablet Take 500 mcg by mouth daily.   Yes Historical Provider, MD    Social History:  reports that she has been smoking Cigarettes.  She has a 15 pack-year smoking history. She has never used smokeless tobacco. She reports that she does not drink alcohol or use illicit drugs.  Family History  Problem Relation Age of Onset  . Parkinsonism Brother   . Stroke Father   . Cancer Sister   . Heart disease Brother   . Clotting disorder Father   . Kidney disease Mother     Review of Systems:  Constitutional: Denies  fever, diaphoresis, appetite change and fatigue.  HEENT: Denies photophobia, eye pain, redness, hearing loss, ear pain, congestion, sore throat, rhinorrhea, sneezing, mouth sores, trouble swallowing, neck pain, neck stiffness and tinnitus.   Respiratory: Denies chest tightness.  Cardiovascular: Denies chest pain, palpitations and leg swelling.  Gastrointestinal: Denies nausea, vomiting, abdominal pain, diarrhea, constipation, blood in stool and abdominal distention.  Genitourinary: Denies dysuria, urgency, frequency, hematuria, flank pain and difficulty urinating.  Endocrine: Denies:  hot or cold intolerance, sweats, changes in hair or nails, polyuria, polydipsia. Musculoskeletal: Denies myalgias, back pain, joint swelling, arthralgias and gait problem.  Skin: Denies pallor, rash and wound.  Neurological: Denies dizziness, seizures, syncope, weakness, light-headedness, numbness and headaches.  Hematological: Denies adenopathy. Easy bruising, personal or family bleeding history  Psychiatric/Behavioral: Denies suicidal ideation, mood changes, confusion, nervousness, sleep disturbance and agitation   Physical Exam: Blood pressure 128/49, pulse 107, temperature 97.6 F (36.4 C), temperature source Oral, resp. rate 18, height _0  (1.6 m), weight 53.298 kg (117 lb 8 oz), SpO2 96 %. Gen.: Alert, awake, oriented 3 HEENT: Normocephalic, atraumatic, pupils equal round and reactive to light, extraocular movements intact, pharyngeal erythema, moist mucous membranes Neck: Supple, no JVD, no lymphadenopathy, no bruits, no goiter. Cardiovascular: Tachycardic, regular, no murmurs, rubs or gallops. Lungs: Currently clear to auscultation bilaterally, no wheezes, fair air movement. Abdomen: Soft, nontender, nondistended, positive bowel sounds, no masses or organomegaly noted. Extremities: No clubbing, cyanosis or edema, positive pulses. Neurologic: Grossly intact and nonfocal.  Labs on Admission:  Results  for orders placed or performed during the hospital encounter of 12/24/14 (from the past 48 hour(s))  CBC with Differential     Status: Abnormal   Collection Time: 12/24/14  9:27 AM  Result Value Ref Range   WBC 9.9 4.0 - 10.5 K/uL   RBC 4.51 3.87 - 5.11 MIL/uL   Hemoglobin 14.1 12.0 - 15.0 g/dL   HCT 42.1 36.0 - 46.0 %   MCV 93.3 78.0 - 100.0 fL   MCH 31.3 26.0 - 34.0 pg   MCHC 33.5 30.0 - 36.0 g/dL   RDW 13.3 11.5 - 15.5 %   Platelets 319 150 - 400 K/uL   Neutrophils Relative % 61 %   Neutro Abs 6.0 1.7 - 7.7 K/uL   Lymphocytes Relative 25 %   Lymphs Abs 2.5 0.7 - 4.0 K/uL   Monocytes Relative 11 %   Monocytes Absolute 1.1 (H) 0.1 - 1.0 K/uL   Eosinophils Relative 3 %   Eosinophils Absolute 0.3 0.0 - 0.7 K/uL   Basophils Relative 0 %   Basophils Absolute 0.0 0.0 - 0.1 K/uL  Basic metabolic panel     Status: Abnormal   Collection Time: 12/24/14  9:27 AM  Result Value Ref Range   Sodium 138 135 - 145 mmol/L   Potassium 4.0 3.5 - 5.1 mmol/L   Chloride 100 (L) 101 - 111 mmol/L   CO2 30 22 - 32 mmol/L   Glucose, Bld 282 (H) 65 - 99 mg/dL   BUN 13 6 - 20 mg/dL   Creatinine, Ser 0.80 0.44 - 1.00 mg/dL   Calcium 9.4 8.9 - 10.3 mg/dL   GFR calc non Af Amer >60 >60 mL/min   GFR calc Af Amer >60 >60 mL/min    Comment: (NOTE) The eGFR has been calculated using the CKD EPI equation. This calculation has not been validated in all clinical situations. eGFR's persistently <60 mL/min signify possible Chronic Kidney Disease.    Anion gap 8 5 - 15  Troponin I     Status: None   Collection Time: 12/24/14  9:27 AM  Result Value Ref Range   Troponin I <0.03 <0.031 ng/mL    Comment:        NO INDICATION OF MYOCARDIAL INJURY.   Rapid strep screen     Status: None   Collection Time: 12/24/14 11:28 AM  Result Value Ref Range   Streptococcus, Group A Screen (Direct)  NEGATIVE NEGATIVE    Comment: (NOTE) A Rapid Antigen test may result negative if the antigen level in the sample is below  the detection level of this test. The FDA has not cleared this test as a stand-alone test therefore the rapid antigen negative result has reflexed to a Group A Strep culture.     Radiological Exams on Admission: Dg Chest Port 1 View  12/24/2014  CLINICAL DATA:  Cough, wheezing EXAM: PORTABLE CHEST 1 VIEW COMPARISON:  11/27/2014 FINDINGS: Cardiomediastinal silhouette is stable. There is mild hyperinflation. No acute infiltrate or pleural effusion. No pulmonary edema. IMPRESSION: No active disease.  Hyperinflation again noted. Electronically Signed   By: Lahoma Crocker M.D.   On: 12/24/2014 10:22    Assessment/Plan Principal Problem:   COPD with acute exacerbation (HCC) Active Problems:   HLD (hyperlipidemia)   Essential hypertension   Tobacco abuse   Type 2 diabetes mellitus, uncontrolled (HCC)   COPD exacerbation (HCC)   Acute hypoxemic respiratory failure -Suspect secondary to upper respiratory infection on top of COPD with acute exacerbation. -Provide oxygen support as required.  COPD with acute exacerbation -Albuterol/Atrovent nebs. -Prednisone 50 mg daily, quick taper starting in a.m. -Levaquin.  Upper respiratory infection -She appears to have sinusitis with headache, nasal congestion, runny nose postnasal drip and cough. -We'll treat with Mucinex, Robitussin, Tylenol and other over-the-counter medications.  Tobacco abuse -Counseled on cessation.  Type 2 diabetes, uncontrolled -Continue Lantus, place on sliding scale insulin, suspect CBGs will be elevated due to ongoing treatment with steroids.  DVT prophylaxis -Lovenox  CODE STATUS -Full code   Time Spent on Admission: 75 minutes  HERNANDEZ ACOSTA,ESTELA [facility] Pager: [phone] 12/24/2014, 3:31 PM

## 2014-12-24 NOTE — ED Notes (Signed)
Pt asked for something else for head pain. Spoke with EDP and was told to hold off at this times for admitting MD to evaluate.

## 2014-12-25 DIAGNOSIS — J069 Acute upper respiratory infection, unspecified: Secondary | ICD-10-CM

## 2014-12-25 DIAGNOSIS — J9601 Acute respiratory failure with hypoxia: Secondary | ICD-10-CM

## 2014-12-25 DIAGNOSIS — J441 Chronic obstructive pulmonary disease with (acute) exacerbation: Secondary | ICD-10-CM | POA: Diagnosis not present

## 2014-12-25 DIAGNOSIS — I10 Essential (primary) hypertension: Secondary | ICD-10-CM | POA: Diagnosis not present

## 2014-12-25 LAB — CBC
HEMATOCRIT: 35.7 % — AB (ref 36.0–46.0)
Hemoglobin: 11.6 g/dL — ABNORMAL LOW (ref 12.0–15.0)
MCH: 30.6 pg (ref 26.0–34.0)
MCHC: 32.5 g/dL (ref 30.0–36.0)
MCV: 94.2 fL (ref 78.0–100.0)
Platelets: 267 10*3/uL (ref 150–400)
RBC: 3.79 MIL/uL — AB (ref 3.87–5.11)
RDW: 13.4 % (ref 11.5–15.5)
WBC: 14.7 10*3/uL — AB (ref 4.0–10.5)

## 2014-12-25 LAB — BASIC METABOLIC PANEL
Anion gap: 4 — ABNORMAL LOW (ref 5–15)
BUN: 17 mg/dL (ref 6–20)
CHLORIDE: 106 mmol/L (ref 101–111)
CO2: 27 mmol/L (ref 22–32)
Calcium: 9 mg/dL (ref 8.9–10.3)
Creatinine, Ser: 0.75 mg/dL (ref 0.44–1.00)
GFR calc Af Amer: >60 mL/min (ref >60)
GFR calc non Af Amer: >60 mL/min (ref >60)
Glucose, Bld: 213 mg/dL — ABNORMAL HIGH (ref 65–99)
POTASSIUM: 5.6 mmol/L — AB (ref 3.5–5.1)
SODIUM: 137 mmol/L (ref 135–145)

## 2014-12-25 LAB — GLUCOSE, CAPILLARY
GLUCOSE-CAPILLARY: 99 mg/dL (ref 65–99)
Glucose-Capillary: 278 mg/dL — ABNORMAL HIGH (ref 65–99)
Glucose-Capillary: 188 mg/dL — ABNORMAL HIGH (ref 65–99)

## 2014-12-25 LAB — HEMOGLOBIN A1C
Hgb A1c MFr Bld: 8.2 % — ABNORMAL HIGH (ref 4.8–5.6)
Mean Plasma Glucose: 189 mg/dL

## 2014-12-25 MED ORDER — LEVOFLOXACIN 750 MG PO TABS: 750.0000 mg | ORAL_TABLET | Freq: Every day | ORAL | Status: DC

## 2014-12-25 MED ORDER — GUAIFENESIN 100 MG/5ML PO SOLN: 5.0000 mL | ORAL | Status: DC | PRN

## 2014-12-25 MED ORDER — TIOTROPIUM BROMIDE MONOHYDRATE 18 MCG IN CAPS: 18.0000 ug | ORAL_CAPSULE | Freq: Every day | RESPIRATORY_TRACT | Status: DC

## 2014-12-25 MED ORDER — ALBUTEROL SULFATE HFA 108 (90 BASE) MCG/ACT IN AERS: 2.0000 | INHALATION_SPRAY | Freq: Four times a day (QID) | RESPIRATORY_TRACT | Status: DC | PRN

## 2014-12-25 MED ORDER — GUAIFENESIN ER 600 MG PO TB12: 1200.0000 mg | ORAL_TABLET | Freq: Two times a day (BID) | ORAL | Status: DC

## 2014-12-25 MED ORDER — PREDNISONE 10 MG PO TABS: 10.0000 mg | ORAL_TABLET | Freq: Every day | ORAL | Status: DC

## 2014-12-25 MED ORDER — BUDESONIDE-FORMOTEROL FUMARATE 160-4.5 MCG/ACT IN AERO: 2.0000 | INHALATION_SPRAY | Freq: Two times a day (BID) | RESPIRATORY_TRACT | Status: AC

## 2014-12-25 NOTE — Discharge Summary (Signed)
Physician Discharge Summary  Marie Holmes ZOX:[id number] DOB: 28-Jun-1935 DOA: 12/24/2014  PCP: Arnette Norris, MD  Admit date: 12/24/2014 Discharge date: 12/25/2014  Time spent: 45 minutes  Recommendations for Outpatient Follow-up:  -Will be discharged home today. -Advised to follow up with PCP in 2 weeks.   Discharge Diagnoses:  Principal Problem:   COPD with acute exacerbation ([place]) Active Problems:   HLD (hyperlipidemia)   Essential hypertension   Tobacco abuse   Type 2 diabetes mellitus, uncontrolled (HCC)   COPD exacerbation (HCC)   Acute respiratory failure with hypoxia ([place])   Acute upper respiratory infection   Discharge Condition: Stable and improved  Filed Weights   12/24/14 0926 12/24/14 1346  Weight: 52.164 kg (115 lb) 53.298 kg (117 lb 8 oz)    History of present illness:  Patient is a 79 year old woman with history of COPD, continued tobacco abuse, hypertension, diabetes and other issues who presents to the hospital today with cough and shortness of breath that have been present for about 2 months but have worsened over the past 2 days. She states that she has visited her PCP on 2 separate occasions and has received 2 steroid shots as well as 2 separate courses of doxycycline which she feels have not helped. Overnight she got increasingly short of breath and this morning asked her daughter to bring her to the hospital for evaluation. She also complains of a pounding headache, runny nose, postnasal drip, cough that is worsened at night while lying down flat. In the emergency department she was found to be hypoxemic to 89% while ambulating,chest x-ray does not show any abnormalities. Patient states she has not had a fever but has had subjective chills. No sick contacts or recent travel. We were asked to admit her for further evaluation and management.   Hospital Course:   Acute Hypoxemic Respiratory Failure -On account of COPD with exacerbation and URI. -Will need  oxygen upon DC.  COPD with acute Exacerbation -Improved, less wheezing on exam today. -Will start on Symbicort/Spiriva to decrease morbidity associated with COPD. -Advised to use albuterol only as a rescue inhaler. -Has 4 days of levaquin remaining. -Will initiate a 6 day steroid taper today.  URI -Treat symptomatically with tylenol, mucinex, robitussin.  Tobacco Abuse -Counseled on cessation. -Not interested in prescription cessation-aides.  Uncontrolled DM II -Has been following closely with her endocrinologist. -Should improve as we initiate steroid taper.    Procedures:  None   Consultations:  None  Discharge Instructions  Discharge Instructions    Increase activity slowly    Complete by:  As directed             Medication List    TAKE these medications        albuterol 108 (90 BASE) MCG/ACT inhaler  Commonly known as:  PROVENTIL HFA;VENTOLIN HFA  Inhale 2 puffs into the lungs every 6 (six) hours as needed for wheezing or shortness of breath.     albuterol 108 (90 BASE) MCG/ACT inhaler  Commonly known as:  PROVENTIL HFA;VENTOLIN HFA  Inhale 2 puffs into the lungs every 6 (six) hours as needed for wheezing or shortness of breath.     ALPRAZolam 0.5 MG tablet  Commonly known as:  XANAX  Take 1 tablet (0.5 mg total) by mouth 3 (three) times daily as needed for anxiety.     aspirin EC 81 MG tablet  Take 81 mg by mouth every morning.     budesonide-formoterol 160-4.5 MCG/ACT inhaler  Commonly known as:  SYMBICORT  Inhale 2 puffs into the lungs 2 (two) times daily.     citalopram 40 MG tablet  Commonly known as:  CELEXA  Take 1 tablet (40 mg total) by mouth daily.     glucose blood test strip  Use as instructed     guaiFENesin 600 MG 12 hr tablet  Commonly known as:  MUCINEX  Take 2 tablets (1,200 mg total) by mouth 2 (two) times daily.     guaiFENesin 100 MG/5ML Soln  Commonly known as:  ROBITUSSIN  Take 5 mLs (100 mg total) by mouth every 4  (four) hours as needed for cough or to loosen phlegm.     insulin aspart 100 UNIT/ML FlexPen  Commonly known as:  NOVOLOG FLEXPEN  Inject 4-6 Units into the skin 3 (three) times daily with meals.     Insulin Glargine 100 UNIT/ML Solostar Pen  Commonly known as:  LANTUS SOLOSTAR  Inject 14 Units into the skin daily before breakfast.     Insulin Pen Holmes 32G X 4 MM Misc  Use to inject 4x daily. Novofine.     levofloxacin 750 MG tablet  Commonly known as:  LEVAQUIN  Take 1 tablet (750 mg total) by mouth daily.     losartan 50 MG tablet  Commonly known as:  COZAAR  Take 1/2 tablet daily for 4 days and then increase it to one whole tablet daily as previously prescribed.     metoprolol succinate 50 MG 24 hr tablet  Commonly known as:  TOPROL XL  Take 1 tablet (50 mg total) by mouth daily. Take with or immediately following a meal.     ONE TOUCH BASIC SYSTEM W/DEVICE Kit  Use as directed to test blood sugar three times daily Dx:R73.09     ONE TOUCH LANCETS Misc  Use as directed to test blood sugar three times daily Dx:R73.09     predniSONE 10 MG tablet  Commonly known as:  DELTASONE  Take 1 tablet (10 mg total) by mouth daily with breakfast. Take 6 tablets today and then decrease by 1 tablet daily until none are left.     tiotropium 18 MCG inhalation capsule  Commonly known as:  SPIRIVA HANDIHALER  Place 1 capsule (18 mcg total) into inhaler and inhale daily.     vitamin B-12 500 MCG tablet  Commonly known as:  CYANOCOBALAMIN  Take 500 mcg by mouth daily.       Allergies  Allergen Reactions  . Sulfonamide Derivatives Anaphylaxis  . Metformin And Related Diarrhea       Follow-up Information    Follow up with Arnette Norris, MD. Schedule an appointment as soon as possible for a visit in 2 weeks.   Specialty:  Family Medicine   Contact information:   [place] [address] [phone]        The results of significant diagnostics from this  hospitalization (including imaging, microbiology, ancillary and laboratory) are listed below for reference.    Significant Diagnostic Studies: Dg Chest 2 View  11/27/2014  CLINICAL DATA:  Productive cough for 2 months, shortness of breath, history of lung carcinoma, smoking history EXAM: CHEST  2 VIEW COMPARISON:  Chest x-ray of 09/11/2014, 06/24/2014, and CT chest of 04/14/2014 FINDINGS: No active infiltrate or effusion is seen. The lungs remain somewhat hyperaerated consistent with emphysema as described on recent CT of the chest. Mediastinal and hilar contours are unremarkable. The heart is within normal limits in  size. And opacity on the lateral view overlying the anterior aspect of the upper heart represents calcification near the aortic valve when compared to recent CT of the chest. No bony abnormality is seen. IMPRESSION: Emphysema.  No active lung disease. Electronically Signed   By: Ivar Drape M.D.   On: 11/27/2014 16:41   Dg Chest Port 1 View  12/24/2014  CLINICAL DATA:  Cough, wheezing EXAM: PORTABLE CHEST 1 VIEW COMPARISON:  11/27/2014 FINDINGS: Cardiomediastinal silhouette is stable. There is mild hyperinflation. No acute infiltrate or pleural effusion. No pulmonary edema. IMPRESSION: No active disease.  Hyperinflation again noted. Electronically Signed   By: Lahoma Crocker M.D.   On: 12/24/2014 10:22    Microbiology: Recent Results (from the past 240 hour(s))  Rapid strep screen     Status: None   Collection Time: 12/24/14 11:28 AM  Result Value Ref Range Status   Streptococcus, Group A Screen (Direct) NEGATIVE NEGATIVE Final    Comment: (NOTE) A Rapid Antigen test may result negative if the antigen level in the sample is below the detection level of this test. The FDA has not cleared this test as a stand-alone test therefore the rapid antigen negative result has reflexed to a Group A Strep culture.      Labs: Basic Metabolic Panel:  Recent Labs Lab 12/24/14 0927  12/24/14 1906 12/25/14 0606  NA 138  --  137  K 4.0  --  5.6*  CL 100*  --  106  CO2 30  --  27  GLUCOSE 282* 441* 213*  BUN 13  --  17  CREATININE 0.80  --  0.75  CALCIUM 9.4  --  9.0   Liver Function Tests: No results for input(s): AST, ALT, ALKPHOS, BILITOT, PROT, ALBUMIN in the last 168 hours. No results for input(s): LIPASE, AMYLASE in the last 168 hours. No results for input(s): AMMONIA in the last 168 hours. CBC:  Recent Labs Lab 12/24/14 0927 12/25/14 0606  WBC 9.9 14.7*  NEUTROABS 6.0  --   HGB 14.1 11.6*  HCT 42.1 35.7*  MCV 93.3 94.2  PLT 319 267   Cardiac Enzymes:  Recent Labs Lab 12/24/14 0927  TROPONINI <0.03   BNP: BNP (last 3 results) No results for input(s): BNP in the last 8760 hours.  ProBNP (last 3 results) No results for input(s): PROBNP in the last 8760 hours.  CBG:  Recent Labs Lab 12/24/14 1654 12/24/14 2117 12/25/14 0721 12/25/14 1155  GLUCAP 455* 278* 188* 99       Signed:  HERNANDEZ ACOSTA,Marie Holmes  [facility] Pager: [phone] 12/25/2014, 12:20 PM

## 2014-12-25 NOTE — Consult Note (Signed)
   [facility] CM Inpatient Consult   12/25/2014  Marie Holmes 1936-01-06 [id number]   Spoke with patient and daughter at bedside regarding [facility] services. Patient does not want to participate with [facility] at this time. Patient given [facility] brochure and contact information for future reference.  Of note, [facility] Care Management services would not replace or interfere with any services that are arranged by inpatient case management or social work. For additional questions or referrals please contact: Royetta Crochet. Laymond Purser, RN, BSN, [facility] Liaison [phone]

## 2014-12-25 NOTE — Progress Notes (Signed)
SATURATION QUALIFICATIONS: (This note is used to comply with regulatory documentation for home oxygen)  Patient Saturations on Room Air at Rest = 92 %  Patient Saturations on Room Air while Ambulating =90  %  Patient Saturations on 2 Liters of oxygen while Ambulating = 96 %

## 2014-12-25 NOTE — Care Management Note (Signed)
Case Management Note  Patient Details  Name: ZAHLI VETSCH MRN: [id number] Date of Birth: Aug 23, 1935  Subjective/Objective:                  Pt is from home and is ind with ADL's. Pt has no HH services, home O2 or neb machine. Pt admitted for COPD exacerbation.   Action/Plan: Pt discharging home today with self care. Home O2 assessment completed, pt does not meet requirements. No CM needs.   Expected Discharge Date:   12/25/2014               Expected Discharge Plan:  Home/Self Care  In-House Referral:  NA  Discharge planning Services  CM Consult  Post Acute Care Choice:  NA Choice offered to:  NA  DME Arranged:    DME Agency:     HH Arranged:    HH Agency:     Status of Service:  Completed, signed off  Medicare Important Message Given:    Date Medicare IM Given:    Medicare IM give by:    Date Additional Medicare IM Given:    Additional Medicare Important Message give by:     If discussed at [place] of Stay Meetings, dates discussed:    Additional Comments:  Sherald Barge, RN 12/25/2014, 1:08 PM

## 2014-12-25 NOTE — Care Management Obs Status (Signed)
[place] NOTIFICATION   Patient Details  Name: Marie Holmes MRN: [id number] Date of Birth: 02/16/1936   Medicare Observation Status Notification Given:  Yes    Sherald Barge, RN 12/25/2014, 12:40 PM

## 2014-12-25 NOTE — Progress Notes (Signed)
Pt's IV catheter removed and intact. Pt's IV site clean dry and intact. Discharge instructions, medications and follow up appointments reviewed and discussed with patient and her daughter. All questions were answered and no further questions at this time. Pt verbalized understanding of discharge instructions and medications. Pt in stable condition and in no acute distress at this time. Pt escorted by RN.

## 2014-12-26 ENCOUNTER — Telehealth: Payer: Self-pay | Admitting: *Deleted

## 2014-12-26 NOTE — Telephone Encounter (Signed)
Transition Care Management Follow-up Telephone Call   Date discharged? 12/25/14   How have you been since you were released from the hospital? Patient states she isn't feeling well.  Still has cough, wheezing, and SOB   Do you understand why you were in the hospital? yes   Do you understand the discharge instructions? yes   Where were you discharged to? Home   Items Reviewed:  Medications reviewed: yes  Allergies reviewed: yes  Dietary changes reviewed: no  Referrals reviewed: no   Functional Questionnaire:   Activities of Daily Living (ADLs):   She states they are independent in the following: ambulation, bathing and hygiene, feeding, continence, grooming, toileting and dressing States they require assistance with the following: None   Any transportation issues/concerns?: no   Any patient concerns? no   Confirmed importance and date/time of follow-up visits scheduled yes, 12/30/14 @ 1315  Provider Appointment booked with Arnette Norris, MD  Confirmed with patient if condition begins to worsen call PCP or go to the ER.  Patient was given the office number and encouraged to call back with question or concerns.  : yes

## 2014-12-27 LAB — CULTURE, GROUP A STREP: STREP A CULTURE: NEGATIVE

## 2014-12-30 ENCOUNTER — Ambulatory Visit (INDEPENDENT_AMBULATORY_CARE_PROVIDER_SITE_OTHER): Payer: PPO | Admitting: Family Medicine

## 2014-12-30 VITALS — BP 128/60 | HR 80 | Temp 98.4°F | Wt 119.5 lb

## 2014-12-30 DIAGNOSIS — J9601 Acute respiratory failure with hypoxia: Secondary | ICD-10-CM

## 2014-12-30 DIAGNOSIS — E1159 Type 2 diabetes mellitus with other circulatory complications: Secondary | ICD-10-CM | POA: Diagnosis not present

## 2014-12-30 DIAGNOSIS — J069 Acute upper respiratory infection, unspecified: Secondary | ICD-10-CM

## 2014-12-30 DIAGNOSIS — J441 Chronic obstructive pulmonary disease with (acute) exacerbation: Secondary | ICD-10-CM | POA: Diagnosis not present

## 2014-12-30 DIAGNOSIS — E1165 Type 2 diabetes mellitus with hyperglycemia: Secondary | ICD-10-CM

## 2014-12-30 DIAGNOSIS — Z794 Long term (current) use of insulin: Secondary | ICD-10-CM

## 2014-12-30 DIAGNOSIS — Z23 Encounter for immunization: Secondary | ICD-10-CM | POA: Diagnosis not present

## 2014-12-30 DIAGNOSIS — Z09 Encounter for follow-up examination after completed treatment for conditions other than malignant neoplasm: Secondary | ICD-10-CM

## 2014-12-30 LAB — CBC WITH DIFFERENTIAL/PLATELET
BASOS ABS: 0 10*3/uL (ref 0.0–0.1)
BASOS PCT: 0 % (ref 0.0–3.0)
EOS ABS: 0 10*3/uL (ref 0.0–0.7)
Eosinophils Relative: 0.1 % (ref 0.0–5.0)
HEMATOCRIT: 40.7 % (ref 36.0–46.0)
Hemoglobin: 13.4 g/dL (ref 12.0–15.0)
LYMPHS ABS: 0.9 10*3/uL (ref 0.7–4.0)
MCHC: 32.8 g/dL (ref 30.0–36.0)
MCV: 93.7 fl (ref 78.0–100.0)
MONO ABS: 0.4 10*3/uL (ref 0.1–1.0)
Monocytes Relative: 2.8 % — ABNORMAL LOW (ref 3.0–12.0)
NEUTROS ABS: 12.7 10*3/uL — AB (ref 1.4–7.7)
PLATELETS: 299 10*3/uL (ref 150.0–400.0)
RBC: 4.35 Mil/uL (ref 3.87–5.11)
RDW: 14.6 % (ref 11.5–15.5)
WBC: 13.9 10*3/uL — AB (ref 4.0–10.5)

## 2014-12-30 NOTE — Progress Notes (Signed)
Pre visit review using our clinic review tool, if applicable. No additional management support is needed unless otherwise documented below in the visit note. 
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
## 2014-12-30 NOTE — Assessment & Plan Note (Signed)
Symptoms some what improved s/p abx and steroid burst. She is not compliant with maintenance inhalers or O2. Refusing pulmonary referral and unfortunately continues to smoke. Will recheck CBC today. O2 sats more reassuring today.

## 2014-12-30 NOTE — Assessment & Plan Note (Signed)
Followed by endo. I expect FSBS to be elevated given recent acute illness and steroid use.

## 2014-12-30 NOTE — Addendum Note (Signed)
Addended by: Modena Nunnery on: 12/30/2014 01:48 PM   Modules accepted: Orders

## 2014-12-31 ENCOUNTER — Other Ambulatory Visit: Payer: Self-pay | Admitting: Family Medicine

## 2014-12-31 ENCOUNTER — Other Ambulatory Visit: Payer: PPO

## 2015-01-01 ENCOUNTER — Other Ambulatory Visit: Payer: Self-pay | Admitting: Family Medicine

## 2015-01-01 MED ORDER — GLUCOSE BLOOD VI STRP: ORAL_STRIP | Status: DC

## 2015-01-01 MED ORDER — ONETOUCH LANCETS MISC: Status: DC

## 2015-01-13 ENCOUNTER — Other Ambulatory Visit: Payer: Self-pay | Admitting: Family Medicine

## 2015-01-14 ENCOUNTER — Other Ambulatory Visit (INDEPENDENT_AMBULATORY_CARE_PROVIDER_SITE_OTHER): Payer: PPO

## 2015-01-14 DIAGNOSIS — R7989 Other specified abnormal findings of blood chemistry: Secondary | ICD-10-CM

## 2015-01-14 LAB — CBC WITH DIFFERENTIAL/PLATELET
Basophils Absolute: 0 10*3/uL (ref 0.0–0.1)
Basophils Relative: 0.5 % (ref 0.0–3.0)
EOS PCT: 3.7 % (ref 0.0–5.0)
Eosinophils Absolute: 0.3 10*3/uL (ref 0.0–0.7)
HEMATOCRIT: 41.1 % (ref 36.0–46.0)
HEMOGLOBIN: 13.4 g/dL (ref 12.0–15.0)
LYMPHS ABS: 1.7 10*3/uL (ref 0.7–4.0)
Lymphocytes Relative: 23 % (ref 12.0–46.0)
MCHC: 32.6 g/dL (ref 30.0–36.0)
MCV: 93.7 fl (ref 78.0–100.0)
MONO ABS: 0.6 10*3/uL (ref 0.1–1.0)
MONOS PCT: 8.5 % (ref 3.0–12.0)
NEUTROS ABS: 4.7 10*3/uL (ref 1.4–7.7)
Neutrophils Relative %: 64.3 % (ref 43.0–77.0)
Platelets: 298 10*3/uL (ref 150.0–400.0)
RBC: 4.38 Mil/uL (ref 3.87–5.11)
RDW: 14.6 % (ref 11.5–15.5)
WBC: 7.3 10*3/uL (ref 4.0–10.5)

## 2015-01-22 ENCOUNTER — Telehealth: Payer: Self-pay | Admitting: Internal Medicine

## 2015-01-22 NOTE — Telephone Encounter (Signed)
Patient stated that her b/s is bottoming out 48 about 30 min ago, she is drinking orange juice and cookies, went up to 81, and it keep dropping, please advise

## 2015-01-22 NOTE — Telephone Encounter (Signed)
Continue taking 15 g of carbohydrates and checking every 15 minutes and then eat a regular meal when she gets to 100. Please call us back later in the afternoon, before 5 PM with her sugars.  We will probably need to change her insulin regimen, please let us know: - Exactly what doses of Lantus and NovoLog she is taking now - If she is aware of something that could have precipitated this hypoglycemic episode

## 2015-01-22 NOTE — Telephone Encounter (Signed)
Please read message below and advise.  

## 2015-01-22 NOTE — Telephone Encounter (Signed)
Called pt and advised her per Dr Arman Filter message. Pt is feeling ok at this time. Pt stated this morning was the first time that it happened in the morning. Usually it happens in the evening. Pt will call us tomorrow and let us know her sugars and what insulin dosage she takes. Pt to call back before lunch tomorrow. Be advised.

## 2015-01-26 ENCOUNTER — Telehealth: Payer: Self-pay | Admitting: Internal Medicine

## 2015-01-26 MED ORDER — INSULIN ASPART 100 UNIT/ML FLEXPEN: 6.0000 [IU] | PEN_INJECTOR | Freq: Three times a day (TID) | SUBCUTANEOUS | Status: DC

## 2015-01-26 NOTE — Telephone Encounter (Signed)
Pt needs refill for one month of novolog call into [facility]

## 2015-02-10 ENCOUNTER — Other Ambulatory Visit: Payer: Self-pay | Admitting: *Deleted

## 2015-02-10 ENCOUNTER — Other Ambulatory Visit: Payer: Self-pay | Admitting: Family Medicine

## 2015-02-10 MED ORDER — INSULIN PEN NEEDLE 32G X 4 MM MISC: Status: DC

## 2015-02-10 MED ORDER — ALPRAZOLAM 0.5 MG PO TABS: 0.5000 mg | ORAL_TABLET | Freq: Three times a day (TID) | ORAL | Status: DC | PRN

## 2015-02-10 NOTE — Telephone Encounter (Signed)
Rx called to pharmacy. Advised patient by telephone.

## 2015-02-10 NOTE — Telephone Encounter (Signed)
Pt left v/m requesting status of refill alprazolam to [facility] [place]. Pt request cb. Last refilled # 90 on 12/11/14. Last hospital f/u 12/30/14.Please advise.

## 2015-02-12 ENCOUNTER — Encounter: Payer: Self-pay | Admitting: Internal Medicine

## 2015-02-12 ENCOUNTER — Ambulatory Visit (INDEPENDENT_AMBULATORY_CARE_PROVIDER_SITE_OTHER): Payer: PPO | Admitting: Internal Medicine

## 2015-02-12 VITALS — BP 118/70 | HR 79 | Temp 97.6°F | Resp 12 | Wt 126.0 lb

## 2015-02-12 DIAGNOSIS — Z794 Long term (current) use of insulin: Secondary | ICD-10-CM

## 2015-02-12 DIAGNOSIS — E1165 Type 2 diabetes mellitus with hyperglycemia: Secondary | ICD-10-CM | POA: Diagnosis not present

## 2015-02-12 DIAGNOSIS — IMO0001 Reserved for inherently not codable concepts without codable children: Secondary | ICD-10-CM

## 2015-02-12 MED ORDER — INSULIN GLARGINE 100 UNIT/ML SOLOSTAR PEN: 16.0000 [IU] | PEN_INJECTOR | Freq: Every day | SUBCUTANEOUS | Status: DC

## 2015-02-12 NOTE — Progress Notes (Signed)
Patient ID: Marie Holmes, female   DOB: 1935-04-04, 79 y.o.   MRN: [id number]  HPI: Marie Holmes is a 79 y.o.-year-old female, returning for follow-up for DM2, dx in ~2010, non-insulin-dependent, uncontrolled, without complications. Last visit 1.5 months ago. Insurance: [employer].  She had a low blood sugars since last visit, at 48, on 01/22/2015. She now tells me she had another low CBG at 38.  In neither situation she knows why she had the low sugars. No skipped meals. Lowest: in the evening.  Last hemoglobin A1c was: Lab Results  Component Value Date   HGBA1C 8.2* 12/24/2014   HGBA1C 8.2 12/23/2014   HGBA1C 7.0* 09/15/2014   She was in the emergency room on 10/25/2014 with CBGs in the 400s (after being given a steroid inj on 10/20/2014 and started on Prednisone for URI). She was previously in the ED 2x in 08/2014.   DM regimen: - Lantus 14 units at bedtime - 170$ for 1 box of pens - Novolog 7-8 units per meal - 209$ for 1 box of pens We changed to Humulin 70/30 pen when she was in the donut hole - but did not start as she had plenty of Lantus. - 20 units before b'fast (30 min before)  - 14 units before dinner (30 min before)  Pt was on a regimen of: - Glipizide XL >> ineffective - Metformin and metformin extended-release >> diarrhea, incontinence - Bydureon >> cost prohibitive - Victoza >> this worked in the past, but pushes her earlier in the donut hole (she liked this, though)  Pt checks her sugars 2-3x a day and they are (per meter download): - am (~ 5 am): 141-180 (8 am) 135, 163 - 233 >> 160-230 >> 152-217 - 2h after b'fast: n/c >> 311-413 >> 155-182 >> ? - before lunch: 99, 116-223 >> 217-444 >> 190-444 >> 126, 150-212 >> ? - 2h after lunch: 170s >> 193-525 >> 163-208, 367 >> 91-173, 249 - before dinner: 119-203 >> 167-473 >> 192-239, 326 >> 67, 69, 112-205, 243 - 2h after dinner: 117 >> 235-345 >> n/c - bedtime: n/c - nighttime: n/c No lows. Lowest sugar  was 108 >> 126 >> 38; she has hypoglycemia awareness at 80  Highest sugar was 300s.  Glucometer: One Touch Ultra Mini  Pt's meals are: - Breakfast: 3 PB crackers (nabs) - Lunch: meat + veggies + creamed potatoes, strawberry pie - Dinner: PB banana sandwich - Snacks: snack  - + mild CKD, last BUN/creatinine:  Lab Results  Component Value Date   BUN 17 12/25/2014   CREATININE 0.75 12/25/2014  On Losartan. - last set of lipids: Lab Results  Component Value Date   CHOL 221* 04/02/2014   HDL 60.00 04/02/2014   LDLCALC 128* 04/02/2014   LDLDIRECT 150.9 04/11/2013   TRIG 167.0* 04/02/2014   CHOLHDL 4 04/02/2014  She was on Zocor >> stopped long time ago. - last eye exam was in 2013. No DR.  - no numbness and tingling in her feet.  She has a h/o Non Hodgkin Lymphoma, COPD, HL, Osteoporosis (she stopped Alendronate b/c somnolence).  ROS: Constitutional: no weight loss, + fatigue, + hot flushes Eyes: no blurry vision, no xerophthalmia ENT: no sore throat, no nodules palpated in throat, no dysphagia/odynophagia, no hoarseness Cardiovascular: no CP/no SOB/no palpitations/+ leg swelling Respiratory: no cough/no SOB/+ wheezing Gastrointestinal: no N/V/D/C/heartburn Musculoskeletal: + muscle/+ joint aches Skin: no rashes Neurological: + tremors/no numbness/tingling/dizziness, + HA  I reviewed pt's medications, allergies,  PMH, social hx, family hx, and changes were documented in the history of present illness. Otherwise, unchanged from my initial visit note. Started Doxycycline.   Past Medical History  Diagnosis Date  . Benign tumor of breast 2004    left  . Broken ribs     history of several broken ribs on right and compression  fx  . Fibroma of skin     Multiple fibromas/lipomas on arms and legs B  . Anxiety   . COPD (chronic obstructive pulmonary disease) ([place])   . Hyperlipidemia   . History of CT scan of abdomen 05/23/2003    Mild fatty liver inf, tiny hypodensity in  right lobe of liver  . Pulmonary nodules   . Diabetes mellitus without complication ([place])   . Hx of radiation therapy 04/11/12- 05/08/12    left chest wall area, 40 gray 20 fx  . Hypertension   . Diverticulosis   . Adenomatous colon polyp   . Cancer ([place])     non-hodgkins b cell lymphoma  . 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 Depression   . Pyelonephritis   . Kidney stone 02/21/1981    Right/removed  . GERD (gastroesophageal reflux disease)   . Osteoarthritis     hands, degenerative back , osteoporosis   . Cold virus     seen by Dr. Deborra Medina on 10/12, given Rx for antibiotic & steroid  . CHF (congestive heart failure) ([place])     hosp. for CHF- 10/2013  . Shortness of breath     only with movemnent   . C. difficile colitis 06/21/2014  . UTI (lower urinary tract infection)   . Palpitations   . Tremor   . Weight loss     "multifactorial" per PMD note  . Chronic abdominal pain   . Gait instability    Past Surgical History  Procedure Laterality Date  . Appendectomy  02/21/1961  . Exploratory laparotomy  02/21/1970    for ? adhesions  . Abdominal hysterectomy  02/21/1970    right ovary remaining  . Bone marrow biopsy  03/09/2012  . Lung core biopsy  02/06/2012  . Tonsillectomy    . Breast surgery  1970's    lump removed- benign   . Tubal ligation    . Eye surgery      bilateral cataracts removed - /W- IOL  . Cholecystectomy N/A 12/11/2013    Procedure: LAPAROSCOPIC CHOLECYSTECTOMY WITH INTRAOPERATIVE CHOLANGIOGRAM;  Surgeon: Erroll Luna, MD;  Location: [facility];  Service: General;  Laterality: N/A;   History   Social History  . Marital Status: Widowed    Spouse Name: N/A  . Number of Children: 4   Occupational History  . Retired     Investment banker, operational   Social History Main Topics  . Smoking status: Current Every Day Smoker -- 0.50 packs/day for 30 years    Types: Cigarettes  . Smokeless tobacco: Never  Used     Comment: 3/4 pack X 50 years  . Alcohol Use: No  . Drug Use: No  . Sexual Activity: Not on file     Comment: pt. stated, " I started smoking again."   Current Outpatient Prescriptions on File Prior to Visit  Medication Sig Dispense Refill  . albuterol (PROVENTIL HFA;VENTOLIN HFA) 108 (90 BASE) MCG/ACT inhaler Inhale 2 puffs into the lungs every 6 (six) hours as needed for wheezing or shortness of breath. 1 Inhaler  2  . ALPRAZolam (XANAX) 0.5 MG tablet Take 1 tablet (0.5 mg total) by mouth 3 (three) times daily as needed for anxiety. 90 tablet 0  . aspirin EC 81 MG tablet Take 81 mg by mouth every morning.    . Blood Glucose Monitoring Suppl ([place]) W/DEVICE KIT Use as directed to test blood sugar three times daily Dx:R73.09 1 each 0  . budesonide-formoterol (SYMBICORT) 160-4.5 MCG/ACT inhaler Inhale 2 puffs into the lungs 2 (two) times daily. 1 Inhaler 12  . citalopram (CELEXA) 40 MG tablet Take 1 tablet (40 mg total) by mouth daily. 90 tablet 3  . glucose blood (ONE TOUCH ULTRA TEST) test strip USE AS DIRECTED TO TEST BLOOD SUGAR THREE TIMES DAILY 100 each 1  . insulin aspart (NOVOLOG FLEXPEN) 100 UNIT/ML FlexPen Inject 6 Units into the skin 3 (three) times daily with meals. 15 mL 0  . Insulin Glargine (LANTUS SOLOSTAR) 100 UNIT/ML Solostar Pen Inject 14 Units into the skin daily before breakfast. 5 pen 1  . Insulin Pen Needle 32G X 4 MM MISC Use to inject 4x daily. Relion Brand 130 each 4  . losartan (COZAAR) 50 MG tablet Take 1/2 tablet daily for 4 days and then increase it to one whole tablet daily as previously prescribed. (Patient taking differently: Take 50 mg by mouth daily. ) 30 tablet 3  . metoprolol succinate (TOPROL-XL) 50 MG 24 hr tablet TAKE ONE TABLET BY MOUTH ONCE DAILY-- TAKE WITH, OR IMMEDIATELY FOLLOWING A MEAL 30 tablet 5  . ONE TOUCH LANCETS MISC Use as directed to test blood sugar three times daily Dx:R73.09 300 each 2  . predniSONE (DELTASONE) 10  MG tablet Take 1 tablet (10 mg total) by mouth daily with breakfast. Take 6 tablets today and then decrease by 1 tablet daily until none are left. 21 tablet 0  . tiotropium (SPIRIVA HANDIHALER) 18 MCG inhalation capsule Place 1 capsule (18 mcg total) into inhaler and inhale daily. 30 capsule 12  . vitamin B-12 (CYANOCOBALAMIN) 500 MCG tablet Take 500 mcg by mouth daily.     No current facility-administered medications on file prior to visit.   Allergies  Allergen Reactions  . Sulfonamide Derivatives Anaphylaxis  . Metformin And Related Diarrhea   Family History  Problem Relation Age of Onset  . Parkinsonism Brother   . Stroke Father   . Cancer Sister   . Heart disease Brother   . Clotting disorder Father   . Kidney disease Mother    PE: BP 118/70 mmHg  Pulse 79  Temp(Src) 97.6 F (36.4 C) (Oral)  Resp 12  Wt 126 lb (57.153 kg)  SpO2 97% Body mass index is 22.33 kg/(m^2). Wt Readings from Last 3 Encounters:  02/12/15 126 lb (57.153 kg)  12/30/14 119 lb 8 oz (54.205 kg)  12/24/14 117 lb 8 oz (53.298 kg)   Constitutional: thin, in NAD Eyes: PERRLA, EOMI, no exophthalmos ENT: moist mucous membranes, no thyromegaly, no cervical lymphadenopathy Cardiovascular: RRR, No MRG Respiratory: CTA B Gastrointestinal: abdomen soft, NT, ND, BS+ Musculoskeletal: no deformities, strength intact in all 4 Skin: moist, warm, no rashes Neurological: + head tremor and with with outstretched hands, DTR normal in all 4  ASSESSMENT: 1. DM2, non-insulin-dependent, uncontrolled, without complications  We tested her for DM1 >> negative: Component     Latest Ref Rng 06/10/2014  Glucose     70 - 99 mg/dL 136 (H)  C-Peptide     0.80 - 3.90 ng/mL 3.17  Glutamic Acid Decarb Ab     <5 IU/mL <5  Pancreatic Islet Cell Antibody     < 5 JDF Units <5   PLAN:  1. Patient with long-standing, reasonably controlled diabetes, but now with more uncontrolled sugars: usually high, but several lows,  especially after dinner. Will increase lantus and decrease Novolog with dinner. - Reviewed last hemoglobin A1c, at 8.2% in 12/2014. - I suggested to:  Patient Instructions  Please increase Lantus to 16 units at night.  Please continue Novolog: - 7-8 units with b'fast and lunch - 6 units with dinner   Please return in 3 weeks  with your sugar log.   - continue checking sugars at different times of the day - check 3 times a day, rotating checks >> needs to write them down. - advised for yearly eye exams >> needs one! - Return to clinic in 3-4 weeks with sugar log

## 2015-02-12 NOTE — Patient Instructions (Signed)
Please increase Lantus to 16 units at night.  Please continue Novolog: - 7-8 units with b'fast and lunch - 6 units with dinner   Please return in 3 weeks  with your sugar log.

## 2015-02-17 ENCOUNTER — Other Ambulatory Visit: Payer: Self-pay | Admitting: Family Medicine

## 2015-03-02 ENCOUNTER — Other Ambulatory Visit: Payer: Self-pay | Admitting: Family Medicine

## 2015-03-04 ENCOUNTER — Other Ambulatory Visit (INDEPENDENT_AMBULATORY_CARE_PROVIDER_SITE_OTHER): Payer: PPO

## 2015-03-04 ENCOUNTER — Other Ambulatory Visit: Payer: Self-pay | Admitting: Family Medicine

## 2015-03-04 DIAGNOSIS — Z01419 Encounter for gynecological examination (general) (routine) without abnormal findings: Secondary | ICD-10-CM

## 2015-03-04 DIAGNOSIS — I10 Essential (primary) hypertension: Secondary | ICD-10-CM

## 2015-03-04 DIAGNOSIS — Z0000 Encounter for general adult medical examination without abnormal findings: Secondary | ICD-10-CM

## 2015-03-04 DIAGNOSIS — E785 Hyperlipidemia, unspecified: Secondary | ICD-10-CM

## 2015-03-04 LAB — COMPREHENSIVE METABOLIC PANEL
ALBUMIN: 4 g/dL (ref 3.5–5.2)
ALT: 11 U/L (ref 0–35)
AST: 14 U/L (ref 0–37)
Alkaline Phosphatase: 79 U/L (ref 39–117)
BUN: 15 mg/dL (ref 6–23)
CALCIUM: 9.8 mg/dL (ref 8.4–10.5)
CHLORIDE: 104 meq/L (ref 96–112)
CO2: 33 meq/L — AB (ref 19–32)
CREATININE: 0.86 mg/dL (ref 0.40–1.20)
GFR: 67.85 mL/min (ref >60.00)
Glucose, Bld: 178 mg/dL — ABNORMAL HIGH (ref 70–99)
Potassium: 4.4 mEq/L (ref 3.5–5.1)
Sodium: 142 mEq/L (ref 135–145)
Total Bilirubin: 0.5 mg/dL (ref 0.2–1.2)
Total Protein: 6.3 g/dL (ref 6.0–8.3)

## 2015-03-04 LAB — CBC WITH DIFFERENTIAL/PLATELET
BASOS ABS: 0.1 10*3/uL (ref 0.0–0.1)
BASOS PCT: 0.8 % (ref 0.0–3.0)
EOS ABS: 0.2 10*3/uL (ref 0.0–0.7)
Eosinophils Relative: 3.3 % (ref 0.0–5.0)
HEMATOCRIT: 44.2 % (ref 36.0–46.0)
HEMOGLOBIN: 14.4 g/dL (ref 12.0–15.0)
LYMPHS PCT: 26.1 % (ref 12.0–46.0)
Lymphs Abs: 1.9 10*3/uL (ref 0.7–4.0)
MCHC: 32.5 g/dL (ref 30.0–36.0)
MCV: 92.7 fl (ref 78.0–100.0)
MONOS PCT: 10.4 % (ref 3.0–12.0)
Monocytes Absolute: 0.8 10*3/uL (ref 0.1–1.0)
Neutro Abs: 4.4 10*3/uL (ref 1.4–7.7)
Neutrophils Relative %: 59.4 % (ref 43.0–77.0)
Platelets: 271 10*3/uL (ref 150.0–400.0)
RBC: 4.76 Mil/uL (ref 3.87–5.11)
RDW: 14.3 % (ref 11.5–15.5)
WBC: 7.5 10*3/uL (ref 4.0–10.5)

## 2015-03-04 LAB — LIPID PANEL
CHOL/HDL RATIO: 3
Cholesterol: 212 mg/dL — ABNORMAL HIGH (ref 0–200)
HDL: 65.6 mg/dL (ref >39.00)
LDL CALC: 125 mg/dL — AB (ref 0–99)
NonHDL: 146.84
Triglycerides: 108 mg/dL (ref 0.0–149.0)
VLDL: 21.6 mg/dL (ref 0.0–40.0)

## 2015-03-04 LAB — TSH: TSH: 1.08 u[IU]/mL (ref 0.35–4.50)

## 2015-03-05 ENCOUNTER — Encounter: Payer: Self-pay | Admitting: *Deleted

## 2015-03-05 ENCOUNTER — Ambulatory Visit (INDEPENDENT_AMBULATORY_CARE_PROVIDER_SITE_OTHER): Payer: PPO | Admitting: Family Medicine

## 2015-03-05 ENCOUNTER — Encounter: Payer: Self-pay | Admitting: Family Medicine

## 2015-03-05 VITALS — BP 134/68 | HR 76 | Temp 97.5°F | Ht 60.25 in | Wt 127.2 lb

## 2015-03-05 DIAGNOSIS — R2240 Localized swelling, mass and lump, unspecified lower limb: Secondary | ICD-10-CM | POA: Insufficient documentation

## 2015-03-05 DIAGNOSIS — E1165 Type 2 diabetes mellitus with hyperglycemia: Secondary | ICD-10-CM

## 2015-03-05 DIAGNOSIS — I10 Essential (primary) hypertension: Secondary | ICD-10-CM | POA: Diagnosis not present

## 2015-03-05 DIAGNOSIS — Z79899 Other long term (current) drug therapy: Secondary | ICD-10-CM | POA: Diagnosis not present

## 2015-03-05 DIAGNOSIS — R300 Dysuria: Secondary | ICD-10-CM | POA: Diagnosis not present

## 2015-03-05 DIAGNOSIS — E785 Hyperlipidemia, unspecified: Secondary | ICD-10-CM | POA: Diagnosis not present

## 2015-03-05 DIAGNOSIS — E1159 Type 2 diabetes mellitus with other circulatory complications: Secondary | ICD-10-CM

## 2015-03-05 DIAGNOSIS — Z0000 Encounter for general adult medical examination without abnormal findings: Secondary | ICD-10-CM

## 2015-03-05 DIAGNOSIS — J441 Chronic obstructive pulmonary disease with (acute) exacerbation: Secondary | ICD-10-CM

## 2015-03-05 DIAGNOSIS — R2241 Localized swelling, mass and lump, right lower limb: Secondary | ICD-10-CM

## 2015-03-05 DIAGNOSIS — Z794 Long term (current) use of insulin: Secondary | ICD-10-CM

## 2015-03-05 LAB — POC URINALSYSI DIPSTICK (AUTOMATED)
BILIRUBIN UA: NEGATIVE
Blood, UA: NEGATIVE
GLUCOSE UA: NEGATIVE
Ketones, UA: NEGATIVE
Nitrite, UA: NEGATIVE
Protein, UA: NEGATIVE
SPEC GRAV UA: 1.015
UROBILINOGEN UA: 0.2
pH, UA: 6

## 2015-03-05 MED ORDER — LOSARTAN POTASSIUM 50 MG PO TABS: ORAL_TABLET | ORAL | Status: DC

## 2015-03-05 MED ORDER — CITALOPRAM HYDROBROMIDE 40 MG PO TABS: 40.0000 mg | ORAL_TABLET | Freq: Every day | ORAL | Status: AC

## 2015-03-05 NOTE — Assessment & Plan Note (Signed)
Followed by endo. No changes made today.

## 2015-03-05 NOTE — Assessment & Plan Note (Signed)
Given duration and progression of symptoms with h/o NHL, CT of femur. The patient indicates understanding of these issues and agrees with the plan.

## 2015-03-05 NOTE — Progress Notes (Signed)
Pre visit review using our clinic review tool, if applicable. No additional management support is needed unless otherwise documented below in the visit note. 

## 2015-03-05 NOTE — Patient Instructions (Signed)
Check with your insurance to see if they will cover the shingles shot.  Please stop by to see Rosaria Ferries on your way out.  It was great to see you.

## 2015-03-05 NOTE — Assessment & Plan Note (Signed)
Symptoms improving now that she is cutting back on cigarettes. Encourage her to continue and stop smoking.

## 2015-03-05 NOTE — Assessment & Plan Note (Signed)
The patients weight, height, BMI and visual acuity have been recorded in the chart.  Cognitive function assessed.   I have made referrals, counseling and provided education to the patient based review of the above and I have provided the pt with a written personalized care plan for preventive services.  Pt to call insurance company regarding zostavax coverage.
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
## 2015-03-06 LAB — URINE CULTURE
COLONY COUNT: NO GROWTH
Organism ID, Bacteria: NO GROWTH

## 2015-03-10 ENCOUNTER — Ambulatory Visit (HOSPITAL_COMMUNITY)
Admission: RE | Admit: 2015-03-10 | Discharge: 2015-03-10 | Disposition: A | Discharge status: Home / Self Care | Payer: PPO | Source: Ambulatory Visit | Attending: Family Medicine | Admitting: Family Medicine

## 2015-03-10 DIAGNOSIS — R2241 Localized swelling, mass and lump, right lower limb: Secondary | ICD-10-CM | POA: Diagnosis not present

## 2015-03-10 DIAGNOSIS — R2231 Localized swelling, mass and lump, right upper limb: Secondary | ICD-10-CM | POA: Diagnosis not present

## 2015-03-10 DIAGNOSIS — I2510 Atherosclerotic heart disease of native coronary artery without angina pectoris: Secondary | ICD-10-CM | POA: Insufficient documentation

## 2015-03-10 MED ORDER — IOHEXOL 300 MG/ML  SOLN
100.0000 mL | Freq: Once | INTRAMUSCULAR | Status: AC | PRN
  Administered 2015-03-10: 100 mL via INTRAVENOUS

## 2015-03-13 ENCOUNTER — Ambulatory Visit: Admit: 2015-03-13 | Discharge: 2015-03-13 | Payer: MEDICARE | Attending: Neurology | Primary: Family Medicine

## 2015-03-13 ENCOUNTER — Ambulatory Visit: Primary: Family Medicine

## 2015-03-13 ENCOUNTER — Other Ambulatory Visit: Payer: Self-pay | Admitting: Family Medicine

## 2015-03-13 DIAGNOSIS — R2242 Localized swelling, mass and lump, left lower limb: Secondary | ICD-10-CM

## 2015-03-13 NOTE — Procedures (Signed)
Procedures  by Melford Aase, MD at 03/13/15 1445                Author: Melford Aase, MD  Service: --  Author Type: Physician       Filed: 03/13/15 1650  Encounter Date: 03/13/2015  Status: Signed          Editor: Melford Aase, MD (Physician)            Procedures        1. EMG NCV MOTOR WITH F/WAVE PER NERVE [[id number]]                              EMG/ NCS Report   [facility]   [place], [place]  [address]   Ph. [phone]   Test Date:  03/13/2015             Patient:  Amy Galloway  DOB:  [birth year]  Physician:  Roxy Manns, MD            Sex:  Female  Height:  ' "  Ref Phys:  Williams Che            ID#:  [id number]  Weight:   lbs.  Technician:  Danley Danker        Patient History   ZH:YQMV JUNE 25/2016,PT. HAD SURG-WRIST WAS SET,CT RELEASED THIS FALL BUT CONTINUE TO    HAVE PAIN AND NUMBNESS IN 3RD AND 4 TH DIGITS. AWAKEN AT NIGHT.         EMG & NCV Findings:   Evaluation of the Left median motor nerve showed normal distal onset latency (3.8 ms), normal amplitude (7.3 mV), and normal conduction velocity (Elbow-Wrist, 50 m/s).  The Left median sensory nerve showed normal distal peak latency (3.5 ms), normal amplitude  (25.5 ??V), and decreased conduction velocity (Wrist-2nd Digit, 45 m/s).  The Left radial sensory nerve showed normal distal peak latency (2.3 ms), normal amplitude (16.9 ??V), and normal conduction velocity [facility] 1st Digit, 53 m/s).  The Left  ulnar sensory nerve showed normal distal peak latency (2.9 ms), normal amplitude (19.4 ??V), and normal conduction velocity (Wrist-5th Digit, 50 m/s).  The Left median/ulnar (palm) comparison nerve showed normal distal onset latency (Median Palm, 1.8  ms), prolonged distal peak latency (Median Palm, 2.3 ms), normal amplitude (Median Palm, 50.4 ??V), normal distal onset latency (Ulnar Palm, 1.5 ms), normal distal peak latency (Ulnar Palm, 1.9 ms), normal amplitude (Ulnar Palm, 16.5 ??V), and normal  onset  latency difference (Median Palm-Ulnar Palm, 0.3 ms).              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             Roxy Manns, MD   Diplomate, American Board of Psychiatry and Neurology   Diplomate, Neuromuscular Medicine   Diplomate, American Board of Electrodiagnostic Medicine                 Nerve Conduction Studies   Anti Sensory Summary Table                 Site  NR  Peak (ms)  Norm Peak (ms)  P-T Amp (??V)  Norm P-T Amp  Site1  Site2  Dist (cm)       Left Median Anti Sensory (2nd Digit)  27.8??C               Wrist      3.5  <3.8  25.5  >10  Wrist  2nd Digit  13.0       Left Radial Anti Sensory (Base 1st Digit)  28.6??C               Wrist      2.3  <2.8  16.9  >10  Wrist  Base 1st Digit  10.0       Left Ulnar Anti Sensory (5th Digit)  27.9??C               Wrist      2.9  <3.2  19.4  >5  Wrist  5th Digit  11.0        Motor Summary Table                  Site  NR  Onset (ms)  Norm Onset (ms)  O-P Amp (mV)  Norm O-P Amp  Site1  Site2  Dist (cm)  Vel (m/s)       Left Median Motor (Abd Poll Brev)  28.8??C                Wrist      3.8  <4.0  7.3  >5.0  Wrist  Abd Poll Brev  5.0                  Elbow      7.8    6.0    Elbow  Wrist  20.0  50        Comparison Summary Table                Site  NR  Peak (ms)  P-T Amp (??V)  Site1  Site2  Dist (cm)  Delta-0 (ms)       Left Median/Ulnar Palm Comparison (Wrist)  28.6??C              Median Palm      2.3  57.7  Median Palm  Ulnar Palm  8.0  0.3              Ulnar Palm      1.9  17.1                   Nerve Conduction Studies   Anti Sensory Left/Right Comparison                    Site  L Lat (ms)  R Lat (ms)  L-R Lat (ms)  L Amp (??V)  R Amp (??V)  L-R Amp (%)  Site1  Site2  L Vel (m/s)  R Vel (m/s)  L-R Vel (m/s)       Median Anti Sensory (2nd Digit)  27.8??C                   Wrist  2.9      25.5      Wrist  2nd Digit  45           Radial Anti Sensory (Base 1st Digit)  28.6??C                  Wrist  1.9      16.9  Wrist  Base 1st Digit  53           Ulnar Anti Sensory (5th Digit)  27.9??C                  Wrist  2.2      19.4      Wrist  5th Digit  50            Motor Left/Right Comparison                    Site  L Lat (ms)  R Lat (ms)  L-R Lat (ms)  L Amp (mV)  R Amp (mV)  L-R Amp (%)  Site1  Site2  L Vel (m/s)  R Vel (m/s)  L-R Vel (m/s)       Median Motor (Abd Poll Brev)  28.8??C                  Wrist  3.8      7.3      Wrist  Abd Poll Brev                        Elbow  7.8      6.0      Elbow  Wrist  50            Comparison Left/Right Comparison               Site  L Lat (ms)  R Lat (ms)  L-R Lat (ms)  L Amp (??V)  R Amp (??V)  L-R Amp (%)       Median/Ulnar Palm Comparison (Wrist)  28.6??C             Median Palm  1.8      50.4                 Ulnar Palm  1.5      16.5                 Waveforms:

## 2015-03-13 NOTE — Progress Notes (Signed)
EMG/NCS done. See Procedure Note for results    Mandy Fitzwater, MD

## 2015-03-13 NOTE — Procedures (Signed)
EMG/ NCS Report  [facility] ??? [facility]  [place], [place]  [address]  Ph. [phone]  Test Date:  03/13/2015    Patient: Amy Galloway DOB: 12/05/1935 Physician: Roxy Manns, MD   Sex: Female Height: ' " Ref Phys: Williams Che   ID#: [id number] Weight:  lbs. Technician: Danley Danker     Patient History  NF:AOZH JUNE 25/2016,PT. HAD SURG-WRIST WAS SET,CT RELEASED THIS FALL BUT CONTINUE TO   HAVE PAIN AND NUMBNESS IN 3RD AND 4 TH DIGITS. AWAKEN AT NIGHT.      EMG & NCV Findings:  Evaluation of the Left median motor nerve showed normal distal onset latency (3.8 ms), normal amplitude (7.3 mV), and normal conduction velocity (Elbow-Wrist, 50 m/s).  The Left median sensory nerve showed normal distal peak latency (3.5 ms), normal amplitude (25.5 ??V), and decreased conduction velocity (Wrist-2nd Digit, 45 m/s).  The Left radial sensory nerve showed normal distal peak latency (2.3 ms), normal amplitude (16.9 ??V), and normal conduction velocity [facility] 1st Digit, 53 m/s).  The Left ulnar sensory nerve showed normal distal peak latency (2.9 ms), normal amplitude (19.4 ??V), and normal conduction velocity (Wrist-5th Digit, 50 m/s).  The Left median/ulnar (palm) comparison nerve showed normal distal onset latency (Median Palm, 1.8 ms), prolonged distal peak latency (Median Palm, 2.3 ms), normal amplitude (Median Palm, 50.4 ??V), normal distal onset latency (Ulnar Palm, 1.5 ms), normal distal peak latency (Ulnar Palm, 1.9 ms), normal amplitude (Ulnar Palm, 16.5 ??V), and normal onset latency difference (Median Palm-Ulnar Palm, 0.3 ms).          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         Roxy Manns, MD  Diplomate, American Board of Psychiatry and Neurology  Diplomate, Neuromuscular Medicine   Diplomate, American Board of Electrodiagnostic Medicine            Nerve Conduction Studies  Anti Sensory Summary Table     Site NR Peak (ms) Norm Peak (ms) P-T Amp (??V) Norm P-T Amp Site1 Site2 Dist (cm)   Left Median Anti Sensory (2nd Digit)  27.8??C   Wrist    3.5 <3.8 25.5 >10 Wrist 2nd Digit 13.0   Left Radial Anti Sensory (Base 1st Digit)  28.6??C   Wrist    2.3 <2.8 16.9 >10 Wrist Base 1st Digit 10.0   Left Ulnar Anti Sensory (5th Digit)  27.9??C   Wrist    2.9 <3.2 19.4 >5 Wrist 5th Digit 11.0     Motor Summary Table     Site NR Onset (ms) Norm Onset (ms) O-P Amp (mV) Norm O-P Amp Site1 Site2 Dist (cm) Vel (m/s)   Left Median Motor (Abd Poll Brev)  28.8??C   Wrist    3.8 <4.0 7.3 >5.0 Wrist Abd Poll Brev 5.0    Elbow    7.8  6.0  Elbow Wrist 20.0 50     Comparison Summary Table     Site NR Peak (ms) P-T Amp (??V) Site1 Site2 Dist (cm) Delta-0 (ms)   Left Median/Ulnar Palm Comparison (Wrist)  28.6??C   Median Palm    2.3 57.7 Median Palm Ulnar Palm 8.0 0.3   Ulnar Palm    1.9 17.1           Nerve Conduction Studies  Anti Sensory Left/Right Comparison     Site L Lat (ms) R Lat (ms) L-R Lat (ms) L Amp (??V) R Amp (??V) L-R Amp (%) Site1 Site2 L Vel (m/s) R Vel (m/s) L-R Vel (m/s)   Median Anti Sensory (2nd Digit)  27.8??C   Wrist 2.9   25.5   Wrist 2nd Digit 45     Radial Anti Sensory (Base 1st Digit)  28.6??C   Wrist 1.9   16.9   Wrist Base 1st Digit 53     Ulnar Anti Sensory (5th Digit)  27.9??C   Wrist 2.2   19.4   Wrist 5th Digit 50       Motor Left/Right Comparison     Site L Lat (ms) R Lat (ms) L-R Lat (ms) L Amp (mV) R Amp (mV) L-R Amp (%) Site1 Site2 L Vel (m/s) R Vel (m/s) L-R Vel (m/s)   Median Motor (Abd Poll Brev)  28.8??C   Wrist 3.8   7.3   Wrist Abd Poll Brev      Elbow 7.8   6.0   Elbow Wrist 50       Comparison Left/Right Comparison     Site L Lat (ms) R Lat (ms) L-R Lat (ms) L Amp (??V) R Amp (??V) L-R Amp (%)   Median/Ulnar Palm Comparison (Wrist)  28.6??C   Median Palm 1.8   50.4      Ulnar Palm 1.5   16.5           Waveforms:

## 2015-03-13 NOTE — Progress Notes (Signed)
Patient was sent by her orthopedist for an EMG and she was inadvertently scheduled for a neurologic consultation.  She was worked into the EMG schedule.  No services rendered by me today.

## 2015-03-13 NOTE — Progress Notes (Signed)
New patient presenting with numbness and pain in left hand since fracture of wrist in 2014. Orthopedic ordered EMG. Have had steroid injection in left hand.

## 2015-03-18 ENCOUNTER — Encounter: Payer: Self-pay | Admitting: Family Medicine

## 2015-03-27 DIAGNOSIS — R2241 Localized swelling, mass and lump, right lower limb: Secondary | ICD-10-CM | POA: Diagnosis not present

## 2015-03-27 DIAGNOSIS — I83819 Varicose veins of unspecified lower extremities with pain: Secondary | ICD-10-CM | POA: Diagnosis not present

## 2015-03-30 ENCOUNTER — Other Ambulatory Visit: Payer: Self-pay | Admitting: Family Medicine

## 2015-03-30 NOTE — Telephone Encounter (Signed)
Patient would like her EMG results please call

## 2015-03-30 NOTE — Telephone Encounter (Signed)
Last f/u 02/2015-CPE

## 2015-03-30 NOTE — Telephone Encounter (Signed)
Rx called in to requested pharmacy 

## 2015-03-31 NOTE — Telephone Encounter (Signed)
Patient was not seen by physician. Message given to L. Shepperson who will notify patient we will fax results to referring doctor.

## 2015-03-31 NOTE — Progress Notes (Signed)
Sent result of EMG to [facility] ortho. This was the referring doctor.  Left message for patient to call that office to get results.

## 2015-04-01 ENCOUNTER — Other Ambulatory Visit (HOSPITAL_COMMUNITY): Payer: Self-pay | Admitting: Surgery

## 2015-04-01 ENCOUNTER — Ambulatory Visit (HOSPITAL_COMMUNITY)
Admission: RE | Admit: 2015-04-01 | Discharge: 2015-04-01 | Disposition: A | Discharge status: Home / Self Care | Payer: PPO | Source: Ambulatory Visit | Attending: Surgery | Admitting: Surgery

## 2015-04-01 DIAGNOSIS — M7989 Other specified soft tissue disorders: Secondary | ICD-10-CM | POA: Insufficient documentation

## 2015-04-01 DIAGNOSIS — M79606 Pain in leg, unspecified: Secondary | ICD-10-CM

## 2015-04-01 DIAGNOSIS — M79651 Pain in right thigh: Secondary | ICD-10-CM | POA: Insufficient documentation

## 2015-04-01 NOTE — Progress Notes (Signed)
VASCULAR LAB PRELIMINARY  PRELIMINARY  PRELIMINARY  PRELIMINARY  Bilateral lower extremity venous duplex  completed.    Preliminary report:  Bilateral:  No evidence of DVT, superficial thrombosis, or Baker's Cyst.    Karnell Vanderloop, RVT 04/01/2015, 1:44 PM

## 2015-04-03 ENCOUNTER — Ambulatory Visit: Payer: Self-pay | Admitting: Surgery

## 2015-04-03 NOTE — H&P (Signed)
etty M. Gradel 03/27/2015 10:27 AM Location: [facility] Patient #: [id number] DOB: Oct 05, 1935 Widowed / Language: Cleophus Molt / Race: White Female   History of Present Illness Marcello Moores A. Kenderick Kobler MD; 03/27/2015 12:19 PM) Patient words: right leg mass/groin pain  Pt returns to the office due to a painful knot on the inside of her right medial upper thigh It has been present for the last couple of months no redness but very tender no leg swelling but she has a history of varicose veins.  The patient is a 80 year old female.   Problem List/Past Medical Ventura Sellers, [place]; 03/27/2015 10:27 AM) S/P LAPAROSCOPIC CHOLECYSTECTOMY (Z90.49) CALCULUS OF GALLBLADDER WITH CHRONIC CHOLECYSTITIS (K80.10)  Other Problems Ventura Sellers, CMA; 03/27/2015 10:27 AM) Anxiety Disorder Back Pain Chest pain Cholelithiasis Chronic Obstructive Lung Disease Congestive Heart Failure Diabetes Mellitus Hemorrhoids High blood pressure Kidney Stone Lung Cancer  Past Surgical History Ventura Sellers, [place]; 03/27/2015 10:28 AM) Appendectomy Colon Polyp Removal - Open Hysterectomy (not due to cancer) - Partial Resection of Stomach Tonsillectomy  Diagnostic Studies History Ventura Sellers, [place]; 03/27/2015 10:28 AM) Colonoscopy 1-5 years ago  Allergies Ventura Sellers, CMA; 03/27/2015 10:28 AM) Chantix *PSYCHOTHERAPEUTIC AND NEUROLOGICAL AGENTS - MISC.* Sulfo Lo *DERMATOLOGICALS* MetFORMIN HCl *ANTIDIABETICS*  Medication History Ventura Sellers, CMA; 03/27/2015 10:29 AM) ALPRAZolam (0.'5MG'$  Tablet, Oral daily) Active. Alendronate Sodium ('70MG'$  Tablet, Oral daily) Active. Citalopram Hydrobromide ('40MG'$  Tablet, Oral daily) Active. GlipiZIDE ('5MG'$  Tablet, Oral daily) Active. Furosemide ('40MG'$  Tablet, Oral daily) Active. Januvia ('50MG'$  Tablet, Oral daily) Active. Losartan Potassium ('50MG'$  Tablet, Oral daily) Active. Metoprolol Succinate ER ('100MG'$  Tablet ER 24HR, Oral  daily) Active. Naproxen ('500MG'$  Tablet, Oral daily) Active. Pantoprazole Sodium ('40MG'$  Tablet DR, Oral daily) Active. Victoza ('18MG'$ /3ML Soln Pen-inj, Subcutaneous daily) Active. TRUEtest Test (In Vitro daily) Active. Medications Reconciled  Social History Ventura Sellers, [place]; 03/27/2015 10:29 AM) Alcohol use Recently quit alcohol use. Caffeine use Carbonated beverages, Coffee. Tobacco use Former smoker.  Family History Ventura Sellers, [place]; 03/27/2015 10:29 AM) Heart Disease Brother. Respiratory Condition Mother.  Vitals [employer] R. Brooks CMA; 03/27/2015 10:27 AM) 03/27/2015 10:27 AM Weight: 130.25 lb Height: 60in Body Surface Area: 1.56 m Body Mass Index: 25.44 kg/m  BP: 134/72 (Sitting, Left Arm, Standard)       Physical Exam (Adaliz Dobis A. Rhyen Mazariego MD; 03/27/2015 12:21 PM) General Mental Status-Alert. General Appearance-Consistent with stated age. Hydration-Well hydrated. Voice-Normal.  Cardiovascular Note: multiple varicosities bilateral lower extremetiies  no edema 2 painful knots along the saphenous vein area medial right thigh no redness no drainage no ulcerations good perfusion warm extremeties   Neurologic Neurologic evaluation reveals -alert and oriented x 3 with no impairment of recent or remote memory. Mental Status-Normal.    Assessment & Plan (Deicy Rusk A. Sohrab Keelan MD; 03/27/2015 12:23 PM) MASS OF RIGHT THIGH (R22.41) Impression: SEE BELOW DUPLEX TO EVALUATE SAPHENOUS VEIN RIGHT VARICOSE VEINS WITH PAIN (I83.819)   MASS tender and patient requests excision since duplex normal  The procedure has been discussed with the patient.  Alternative therapies have been discussed with the patient.  Operative risks include bleeding,  Infection,  Organ injury,  Nerve injury,  Blood vessel injury,  DVT,  Pulmonary embolism,  Death,  And possible reoperation.  Medical management risks include worsening of present situation.  The success of the  procedure is 50 -90 % at treating patients symptoms.  The patient understands and agrees to proceed. if duplex not helpful, made need further imaging vs excison in  [place]. Current Plans Pt Education - [facility] Free Text Education/Instructions: discussed with patient and provided information.

## 2015-04-07 ENCOUNTER — Telehealth: Payer: Self-pay | Admitting: Internal Medicine

## 2015-04-07 MED ORDER — INSULIN ASPART 100 UNIT/ML ~~LOC~~ SOLN: 6.0000 [IU] | Freq: Three times a day (TID) | SUBCUTANEOUS | Status: DC

## 2015-04-07 MED ORDER — "INSULIN SYRINGE 31G X 5/16"" 0.3 ML MISC": Status: DC

## 2015-04-07 NOTE — Telephone Encounter (Signed)
Patient need vials and needles called into the pharmacy (patient didn't know the name of insulin.) she would like a call back

## 2015-04-07 NOTE — Telephone Encounter (Signed)
Patient called to r/s 3/1 f/u to 3/13 due to surgery 2/28. Per patient she will keep scheduled lab/ct 2/27. Patient has new date/time for 3/13 f/u.

## 2015-04-07 NOTE — Telephone Encounter (Signed)
Called pt and she advised me that she would like the vials (Novolog) because they are cheaper. Advised her that I would send the Novolog for her along with insulin syringes to her pharmacy for her.  Be advised.

## 2015-04-13 ENCOUNTER — Encounter: Payer: Self-pay | Admitting: Internal Medicine

## 2015-04-13 ENCOUNTER — Ambulatory Visit (INDEPENDENT_AMBULATORY_CARE_PROVIDER_SITE_OTHER): Payer: PPO | Admitting: Internal Medicine

## 2015-04-13 ENCOUNTER — Other Ambulatory Visit (INDEPENDENT_AMBULATORY_CARE_PROVIDER_SITE_OTHER): Payer: PPO | Admitting: *Deleted

## 2015-04-13 VITALS — BP 118/60 | HR 82 | Temp 98.2°F | Resp 12 | Wt 131.6 lb

## 2015-04-13 DIAGNOSIS — Z794 Long term (current) use of insulin: Secondary | ICD-10-CM | POA: Diagnosis not present

## 2015-04-13 DIAGNOSIS — E1165 Type 2 diabetes mellitus with hyperglycemia: Secondary | ICD-10-CM | POA: Diagnosis not present

## 2015-04-13 DIAGNOSIS — E1159 Type 2 diabetes mellitus with other circulatory complications: Secondary | ICD-10-CM | POA: Diagnosis not present

## 2015-04-13 DIAGNOSIS — IMO0001 Reserved for inherently not codable concepts without codable children: Secondary | ICD-10-CM

## 2015-04-13 LAB — POCT GLYCOSYLATED HEMOGLOBIN (HGB A1C): Hemoglobin A1C: 6.9

## 2015-04-13 MED ORDER — INSULIN ASPART 100 UNIT/ML ~~LOC~~ SOLN: 6.0000 [IU] | Freq: Three times a day (TID) | SUBCUTANEOUS | Status: DC

## 2015-04-13 MED ORDER — INSULIN GLARGINE 100 UNIT/ML SOLOSTAR PEN: 16.0000 [IU] | PEN_INJECTOR | Freq: Every day | SUBCUTANEOUS | Status: DC

## 2015-04-13 NOTE — Patient Instructions (Addendum)
Please increase the Lantus and switch to Basaglar 16 units at bedtime  Continue Novolog 7 units before each meal.  Please come back for a follow-up appointment in 3 months with your log.

## 2015-04-13 NOTE — Progress Notes (Signed)
Patient ID: Marie Holmes, female   DOB: 11-28-1935, 80 y.o.   MRN: [id number]  HPI: Marie Holmes is a 80 y.o.-year-old female, returning for follow-up for DM2, dx in ~2010, non-insulin-dependent, uncontrolled, without complications. Last visit 2 months ago. Insurance: [employer].  She is quitting smoking.  She will have surgery (mass in R thigh). She has a h/o non Hodgkin lymphoma.  Last hemoglobin A1c was: Lab Results  Component Value Date   HGBA1C 6.9 04/13/2015   HGBA1C 8.2* 12/24/2014   HGBA1C 8.2 12/23/2014   She was in the emergency room on 10/25/2014 with CBGs in the 400s (after being given a steroid inj on 10/20/2014 and started on Prednisone for URI). She was previously in the ED 2x in 08/2014.   DM regimen: - Lantus 14 units at bedtime - 170$ for 1 box of pens - Novolog 7 units with B, L, D - 209$ for 1 box of pens  We changed to Humulin 70/30 pen when she was in the donut hole - but did not start as she had plenty of Lantus. - 20 units before b'fast (30 min before)  - 14 units before dinner (30 min before)  Pt was on a regimen of: - Glipizide XL >> ineffective - Metformin and metformin extended-release >> diarrhea, incontinence - Bydureon >> cost prohibitive - Victoza >> this worked in the past, but pushes her earlier in the donut hole (she liked this, though)  Pt checks her sugars 2-3x a day and they are (forgot log): - am (~ 5 am): 141-180 (8 am) 135, 163 - 233 >> 160-230 >> 152-217 >> 150-170 - 2h after b'fast: n/c >> 311-413 >> 155-182 >> n/c - before lunch: 99, 116-223 >> 217-444 >> 190-444 >> 126, 150-212 >> 130s - 2h after lunch: 170s >> 193-525 >> 163-208, 367 >> 91-173, 249 >> n/c - before dinner: 119-203 >> 167-473 >> 192-239, 326 >> 67, 69, 112-205, 243 >> 130s - 2h after dinner: 117 >> 235-345 >> n/c - bedtime: n/c - nighttime: n/c No lows. Lowest sugar was 108 >> 126 >> 38! >> 91; she has hypoglycemia awareness at 80  Highest sugar was  300s >> 200.  Glucometer: One Touch Ultra Mini  Pt's meals are: - Breakfast: 3 PB crackers (nabs) - Lunch: meat + veggies + creamed potatoes, strawberry pie - Dinner: PB banana sandwich - Snacks: snack  - + mild CKD, last BUN/creatinine:  Lab Results  Component Value Date   BUN 15 03/04/2015   CREATININE 0.86 03/04/2015  On Losartan. - last set of lipids: Lab Results  Component Value Date   CHOL 212* 03/04/2015   HDL 65.60 03/04/2015   LDLCALC 125* 03/04/2015   LDLDIRECT 150.9 04/11/2013   TRIG 108.0 03/04/2015   CHOLHDL 3 03/04/2015  She was on Zocor >> stopped long time ago. - last eye exam was in 2013. No DR.  - no numbness and tingling in her feet.  She has a h/o Non Hodgkin Lymphoma, COPD, HL, Osteoporosis (she stopped Alendronate b/c somnolence).  ROS: Constitutional: no weight loss, + fatigue, + hot flushes Eyes: no blurry vision, no xerophthalmia ENT: no sore throat, no nodules palpated in throat, no dysphagia/odynophagia, no hoarseness Cardiovascular: no CP/no SOB/no palpitations/leg swelling Respiratory: no cough/no SOB/+ wheezing Gastrointestinal: no N/V/D/C/heartburn Musculoskeletal: + muscle/+ joint aches Skin: no rashes Neurological: + tremors/no numbness/tingling/dizziness  I reviewed pt's medications, allergies, PMH, social hx, family hx, and changes were documented in the history of present  illness. Otherwise, unchanged from my initial visit note. Started Doxycycline.   Past Medical History  Diagnosis Date  . Benign tumor of breast 2004    left  . Broken ribs     history of several broken ribs on right and compression  fx  . Fibroma of skin     Multiple fibromas/lipomas on arms and legs B  . Anxiety   . COPD (chronic obstructive pulmonary disease) ([place])   . Hyperlipidemia   . History of CT scan of abdomen 05/23/2003    Mild fatty liver inf, tiny hypodensity in right lobe of liver  . Pulmonary nodules   . Diabetes mellitus without complication  ([place])   . Hx of radiation therapy 04/11/12- 05/08/12    left chest wall area, 40 gray 20 fx  . Hypertension   . Diverticulosis   . Adenomatous colon polyp   . Cancer ([place])     non-hodgkins b cell lymphoma  . 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 Depression   . Pyelonephritis   . Kidney stone 02/21/1981    Right/removed  . GERD (gastroesophageal reflux disease)   . Osteoarthritis     hands, degenerative back , osteoporosis   . Cold virus     seen by Dr. Deborra Medina on 10/12, given Rx for antibiotic & steroid  . CHF (congestive heart failure) ([place])     hosp. for CHF- 10/2013  . Shortness of breath     only with movemnent   . C. difficile colitis 06/21/2014  . UTI (lower urinary tract infection)   . Palpitations   . Tremor   . Weight loss     "multifactorial" per PMD note  . Chronic abdominal pain   . Gait instability    Past Surgical History  Procedure Laterality Date  . Appendectomy  02/21/1961  . Exploratory laparotomy  02/21/1970    for ? adhesions  . Abdominal hysterectomy  02/21/1970    right ovary remaining  . Bone marrow biopsy  03/09/2012  . Lung core biopsy  02/06/2012  . Tonsillectomy    . Breast surgery  1970's    lump removed- benign   . Tubal ligation    . Eye surgery      bilateral cataracts removed - /W- IOL  . Cholecystectomy N/A 12/11/2013    Procedure: LAPAROSCOPIC CHOLECYSTECTOMY WITH INTRAOPERATIVE CHOLANGIOGRAM;  Surgeon: Erroll Luna, MD;  Location: [facility];  Service: General;  Laterality: N/A;   History   Social History  . Marital Status: Widowed    Spouse Name: N/A  . Number of Children: 4   Occupational History  . Retired     Investment banker, operational   Social History Main Topics  . Smoking status: Current Every Day Smoker -- 0.50 packs/day for 30 years    Types: Cigarettes  . Smokeless tobacco: Never Used     Comment: 3/4 pack X 50 years  . Alcohol Use: No  . Drug Use: No  .  Sexual Activity: Not on file     Comment: pt. stated, " I started smoking again."   Current Outpatient Prescriptions on File Prior to Visit  Medication Sig Dispense Refill  . albuterol (PROVENTIL HFA;VENTOLIN HFA) 108 (90 BASE) MCG/ACT inhaler Inhale 2 puffs into the lungs every 6 (six) hours as needed for wheezing or shortness of breath. 1 Inhaler 2  . ALPRAZolam (XANAX) 0.5 MG tablet TAKE ONE TABLET BY MOUTH THREE  TIMES DAILY AS NEEDED FOR ANXIETY 90 tablet 0  . aspirin EC 81 MG tablet Take 81 mg by mouth every morning.    . Blood Glucose Monitoring Suppl ([place]) W/DEVICE KIT Use as directed to test blood sugar three times daily Dx:R73.09 1 each 0  . budesonide-formoterol (SYMBICORT) 160-4.5 MCG/ACT inhaler Inhale 2 puffs into the lungs 2 (two) times daily. 1 Inhaler 12  . citalopram (CELEXA) 40 MG tablet Take 1 tablet (40 mg total) by mouth daily. 90 tablet 3  . insulin aspart (NOVOLOG) 100 UNIT/ML injection Inject 6 Units into the skin 3 (three) times daily before meals. 10 mL 1  . Insulin Glargine (LANTUS SOLOSTAR) 100 UNIT/ML Solostar Pen Inject 16 Units into the skin daily before breakfast. (Patient taking differently: Inject 14 Units into the skin at bedtime. ) 5 pen 1  . Insulin Pen Needle 32G X 4 MM MISC Use to inject 4x daily. Relion Brand 130 each 4  . Insulin Syringe-Needle U-100 (INSULIN SYRINGE .3CC/31GX5/16") 31G X 5/16" 0.3 ML MISC Use to inject insulin 3 times daily as instructed. 100 each 5  . losartan (COZAAR) 50 MG tablet TAKE ONE-HALF TABLET BY MOUTH ONCE DAILY FOR 4 DAYS ,  THEN  INCREASE  DOSE  TO  ONE  WHOLE  TABLET  DAILY  AS  PREVIOUSLY  PRESCRIBED. 90 tablet 3  . metoprolol succinate (TOPROL-XL) 50 MG 24 hr tablet TAKE ONE TABLET BY MOUTH ONCE DAILY-- TAKE WITH, OR IMMEDIATELY FOLLOWING A MEAL 30 tablet 5  . ONE TOUCH LANCETS MISC Use as directed to test blood sugar three times daily Dx:R73.09 300 each 2  . ONE TOUCH ULTRA TEST test strip USE ONE STRIP  TO CHECK GLUCOSE THREE TIMES DAILY 100 each 11  . tiotropium (SPIRIVA HANDIHALER) 18 MCG inhalation capsule Place 1 capsule (18 mcg total) into inhaler and inhale daily. 30 capsule 12  . vitamin B-12 (CYANOCOBALAMIN) 500 MCG tablet Take 500 mcg by mouth daily.     No current facility-administered medications on file prior to visit.   Allergies  Allergen Reactions  . Sulfonamide Derivatives Anaphylaxis  . Metformin And Related Diarrhea   Family History  Problem Relation Age of Onset  . Parkinsonism Brother   . Stroke Father   . Cancer Sister   . Heart disease Brother   . Clotting disorder Father   . Kidney disease Mother    PE: BP 118/60 mmHg  Pulse 82  Temp(Src) 98.2 F (36.8 C) (Oral)  Resp 12  Wt 131 lb 9.6 oz (59.693 kg)  SpO2 94% Body mass index is 25.5 kg/(m^2). Wt Readings from Last 3 Encounters:  04/13/15 131 lb 9.6 oz (59.693 kg)  03/05/15 127 lb 4 oz (57.72 kg)  02/12/15 126 lb (57.153 kg)   Constitutional: thin, in NAD Eyes: PERRLA, EOMI, no exophthalmos ENT: moist mucous membranes, no thyromegaly, no cervical lymphadenopathy Cardiovascular: RRR, No MRG Respiratory: CTA B Gastrointestinal: abdomen soft, NT, ND, BS+ Musculoskeletal: no deformities, strength intact in all 4 Skin: moist, warm, no rashes Neurological: + head tremor and with with outstretched hands, DTR normal in all 4  ASSESSMENT: 1. DM2, insulin-dependent, uncontrolled, without complications  We tested her for DM1 >> negative: Component     Latest Ref Rng 06/10/2014  Glucose     70 - 99 mg/dL 136 (H)  C-Peptide     0.80 - 3.90 ng/mL 3.17  Glutamic Acid Decarb Ab     <5 IU/mL <5  Pancreatic  Islet Cell Antibody     < 5 JDF Units <5   PLAN:  1. Patient with long-standing, reasonably controlled diabetes, but now with better control after she improved her diet; sugars in am higher >> will increase Lantus and try to switch to generic Basaglar. - Reviewed last hemoglobin A1c, at 8.2% in  12/2014.  - Today: HbA1c 6.9% (excellent) - I suggested to:  Patient Instructions  Please increase the Lantus and switch to Basaglar 16 units at bedtime  Continue Novolog 7 units before each meal.  Please come back for a follow-up appointment in 3 months with your log.   - continue checking sugars at different times of the day - check 3 times a day, rotating checks >> needs to write them down. - advised for yearly eye exams >> needs one! - Return to clinic in 3 mo with sugar log

## 2015-04-14 ENCOUNTER — Encounter (HOSPITAL_BASED_OUTPATIENT_CLINIC_OR_DEPARTMENT_OTHER): Payer: Self-pay | Admitting: *Deleted

## 2015-04-15 ENCOUNTER — Other Ambulatory Visit: Payer: Self-pay | Admitting: *Deleted

## 2015-04-15 MED ORDER — INSULIN DETEMIR 100 UNIT/ML FLEXPEN: 16.0000 [IU] | PEN_INJECTOR | Freq: Every day | SUBCUTANEOUS | Status: DC

## 2015-04-16 ENCOUNTER — Other Ambulatory Visit: Payer: Self-pay | Admitting: Medical Oncology

## 2015-04-16 ENCOUNTER — Telehealth: Payer: Self-pay | Admitting: Internal Medicine

## 2015-04-16 DIAGNOSIS — C8590 Non-Hodgkin lymphoma, unspecified, unspecified site: Secondary | ICD-10-CM

## 2015-04-16 NOTE — Telephone Encounter (Signed)
Patient called and moved 3/6 lab appointment to 3/7 @ 10 am. Per patient she is currently working with central radiology scheduling re a ct scan for 3/7 @ 11 am.

## 2015-04-16 NOTE — Telephone Encounter (Signed)
PT called to r/s labs closer to MD visit she will also call Central Scheduling to r/s CT to labs... KJ

## 2015-04-17 ENCOUNTER — Encounter (HOSPITAL_COMMUNITY)
Admission: RE | Admit: 2015-04-17 | Discharge: 2015-04-17 | Disposition: A | Discharge status: Home / Self Care | Payer: PPO | Source: Ambulatory Visit | Attending: Surgery | Admitting: Surgery

## 2015-04-17 DIAGNOSIS — Z01812 Encounter for preprocedural laboratory examination: Secondary | ICD-10-CM | POA: Diagnosis not present

## 2015-04-17 LAB — BASIC METABOLIC PANEL
ANION GAP: 9 (ref 5–15)
BUN: 14 mg/dL (ref 6–20)
CALCIUM: 9.2 mg/dL (ref 8.9–10.3)
CO2: 29 mmol/L (ref 22–32)
Chloride: 102 mmol/L (ref 101–111)
Creatinine, Ser: 0.81 mg/dL (ref 0.44–1.00)
GFR calc Af Amer: >60 mL/min (ref >60)
GLUCOSE: 131 mg/dL — AB (ref 65–99)
Potassium: 4.3 mmol/L (ref 3.5–5.1)
Sodium: 140 mmol/L (ref 135–145)

## 2015-04-17 NOTE — Telephone Encounter (Signed)
Patient would like to speak with you re her test results

## 2015-04-17 NOTE — Telephone Encounter (Signed)
Called patient and made her aware she will get results from her referring dr since she was just seen at this office for testing  She states understanding

## 2015-04-20 ENCOUNTER — Encounter

## 2015-04-20 ENCOUNTER — Ambulatory Visit (HOSPITAL_COMMUNITY): Payer: PPO

## 2015-04-20 ENCOUNTER — Other Ambulatory Visit: Payer: PPO

## 2015-04-21 ENCOUNTER — Ambulatory Visit (HOSPITAL_BASED_OUTPATIENT_CLINIC_OR_DEPARTMENT_OTHER): Payer: PPO | Admitting: Anesthesiology

## 2015-04-21 ENCOUNTER — Encounter (HOSPITAL_BASED_OUTPATIENT_CLINIC_OR_DEPARTMENT_OTHER): Payer: Self-pay | Admitting: *Deleted

## 2015-04-21 ENCOUNTER — Encounter (HOSPITAL_BASED_OUTPATIENT_CLINIC_OR_DEPARTMENT_OTHER)
Admission: RE | Disposition: A | Discharge status: Home / Self Care | Payer: Self-pay | Source: Ambulatory Visit | Attending: Surgery

## 2015-04-21 ENCOUNTER — Ambulatory Visit (HOSPITAL_BASED_OUTPATIENT_CLINIC_OR_DEPARTMENT_OTHER)
Admission: RE | Admit: 2015-04-21 | Discharge: 2015-04-21 | Disposition: A | Discharge status: Home / Self Care | Payer: PPO | Source: Ambulatory Visit | Attending: Surgery | Admitting: Surgery

## 2015-04-21 DIAGNOSIS — J449 Chronic obstructive pulmonary disease, unspecified: Secondary | ICD-10-CM | POA: Insufficient documentation

## 2015-04-21 DIAGNOSIS — E119 Type 2 diabetes mellitus without complications: Secondary | ICD-10-CM | POA: Insufficient documentation

## 2015-04-21 DIAGNOSIS — Z90710 Acquired absence of both cervix and uterus: Secondary | ICD-10-CM | POA: Insufficient documentation

## 2015-04-21 DIAGNOSIS — D1723 Benign lipomatous neoplasm of skin and subcutaneous tissue of right leg: Secondary | ICD-10-CM | POA: Diagnosis not present

## 2015-04-21 DIAGNOSIS — R2241 Localized swelling, mass and lump, right lower limb: Secondary | ICD-10-CM | POA: Diagnosis not present

## 2015-04-21 DIAGNOSIS — I10 Essential (primary) hypertension: Secondary | ICD-10-CM | POA: Diagnosis not present

## 2015-04-21 DIAGNOSIS — Z794 Long term (current) use of insulin: Secondary | ICD-10-CM | POA: Diagnosis not present

## 2015-04-21 HISTORY — PX: MASS EXCISION: SHX2000

## 2015-04-21 LAB — GLUCOSE, CAPILLARY: Glucose-Capillary: 147 mg/dL — ABNORMAL HIGH (ref 65–99)

## 2015-04-21 SURGERY — EXCISION MASS
Anesthesia: General | Site: Thigh | Laterality: Right

## 2015-04-21 MED ORDER — GLYCOPYRROLATE 0.2 MG/ML IJ SOLN: 0.2000 mg | Freq: Once | INTRAMUSCULAR | Status: DC | PRN

## 2015-04-21 MED ORDER — SODIUM CHLORIDE 0.9 % IJ SOLN
INTRAMUSCULAR | Status: AC
  Filled 2015-04-21: qty 10

## 2015-04-21 MED ORDER — HYDROCODONE-ACETAMINOPHEN 5-325 MG PO TABS: 1.0000 | ORAL_TABLET | Freq: Four times a day (QID) | ORAL | Status: DC | PRN

## 2015-04-21 MED ORDER — PROPOFOL 10 MG/ML IV BOLUS
INTRAVENOUS | Status: DC | PRN
  Administered 2015-04-21: 100 mg via INTRAVENOUS

## 2015-04-21 MED ORDER — CEFAZOLIN SODIUM 10 G IJ SOLR
3.0000 g | INTRAMUSCULAR | Status: AC
  Administered 2015-04-21: 2 g via INTRAVENOUS

## 2015-04-21 MED ORDER — FENTANYL CITRATE (PF) 100 MCG/2ML IJ SOLN
25.0000 ug | INTRAMUSCULAR | Status: DC | PRN
  Administered 2015-04-21 (×2): 25 ug via INTRAVENOUS

## 2015-04-21 MED ORDER — LIDOCAINE HCL (CARDIAC) 20 MG/ML IV SOLN
INTRAVENOUS | Status: AC
  Filled 2015-04-21: qty 5

## 2015-04-21 MED ORDER — EPHEDRINE SULFATE 50 MG/ML IJ SOLN
INTRAMUSCULAR | Status: AC
  Filled 2015-04-21: qty 1

## 2015-04-21 MED ORDER — FENTANYL CITRATE (PF) 100 MCG/2ML IJ SOLN
INTRAMUSCULAR | Status: AC
Start: 2015-04-21 — End: 2015-04-21
  Filled 2015-04-21: qty 2

## 2015-04-21 MED ORDER — HYDROCODONE-ACETAMINOPHEN 5-325 MG PO TABS
ORAL_TABLET | ORAL | Status: AC
  Filled 2015-04-21: qty 1

## 2015-04-21 MED ORDER — FENTANYL CITRATE (PF) 100 MCG/2ML IJ SOLN
50.0000 ug | INTRAMUSCULAR | Status: DC | PRN
  Administered 2015-04-21 (×2): 25 ug via INTRAVENOUS

## 2015-04-21 MED ORDER — LIDOCAINE HCL (CARDIAC) 20 MG/ML IV SOLN
INTRAVENOUS | Status: DC | PRN
  Administered 2015-04-21: 50 mg via INTRAVENOUS

## 2015-04-21 MED ORDER — HYDROCODONE-ACETAMINOPHEN 5-325 MG PO TABS
1.0000 | ORAL_TABLET | Freq: Four times a day (QID) | ORAL | Status: DC | PRN
  Administered 2015-04-21: 1 via ORAL

## 2015-04-21 MED ORDER — MEPERIDINE HCL 25 MG/ML IJ SOLN: 6.2500 mg | INTRAMUSCULAR | Status: DC | PRN

## 2015-04-21 MED ORDER — CHLORHEXIDINE GLUCONATE 4 % EX LIQD: 1.0000 "application " | Freq: Once | CUTANEOUS | Status: DC

## 2015-04-21 MED ORDER — LACTATED RINGERS IV SOLN
INTRAVENOUS | Status: DC
  Administered 2015-04-21: 07:00:00 via INTRAVENOUS

## 2015-04-21 MED ORDER — SCOPOLAMINE 1 MG/3DAYS TD PT72: 1.0000 | MEDICATED_PATCH | Freq: Once | TRANSDERMAL | Status: DC | PRN

## 2015-04-21 MED ORDER — PROPOFOL 10 MG/ML IV BOLUS
INTRAVENOUS | Status: AC
  Filled 2015-04-21: qty 20

## 2015-04-21 MED ORDER — FENTANYL CITRATE (PF) 100 MCG/2ML IJ SOLN
INTRAMUSCULAR | Status: AC
  Filled 2015-04-21: qty 2

## 2015-04-21 MED ORDER — BUPIVACAINE-EPINEPHRINE (PF) 0.25% -1:200000 IJ SOLN
INTRAMUSCULAR | Status: AC
  Filled 2015-04-21: qty 30

## 2015-04-21 MED ORDER — EPHEDRINE SULFATE 50 MG/ML IJ SOLN
INTRAMUSCULAR | Status: DC | PRN
  Administered 2015-04-21: 10 mg via INTRAVENOUS
  Administered 2015-04-21: 5 mg via INTRAVENOUS
  Administered 2015-04-21: 10 mg via INTRAVENOUS

## 2015-04-21 MED ORDER — BUPIVACAINE HCL (PF) 0.25 % IJ SOLN
INTRAMUSCULAR | Status: AC
  Filled 2015-04-21: qty 120

## 2015-04-21 MED ORDER — ONDANSETRON HCL 4 MG/2ML IJ SOLN
INTRAMUSCULAR | Status: AC
  Filled 2015-04-21: qty 2

## 2015-04-21 MED ORDER — MIDAZOLAM HCL 2 MG/2ML IJ SOLN: 1.0000 mg | INTRAMUSCULAR | Status: DC | PRN

## 2015-04-21 MED ORDER — CEFAZOLIN SODIUM-DEXTROSE 2-3 GM-% IV SOLR
INTRAVENOUS | Status: AC
  Filled 2015-04-21: qty 50

## 2015-04-21 MED ORDER — ONDANSETRON HCL 4 MG/2ML IJ SOLN
INTRAMUSCULAR | Status: DC | PRN
  Administered 2015-04-21: 4 mg via INTRAVENOUS

## 2015-04-21 MED ORDER — BUPIVACAINE-EPINEPHRINE 0.25% -1:200000 IJ SOLN
INTRAMUSCULAR | Status: DC | PRN
  Administered 2015-04-21: 6 mL

## 2015-04-21 SURGICAL SUPPLY — 42 items
BENZOIN TINCTURE PRP APPL 2/3 (GAUZE/BANDAGES/DRESSINGS) IMPLANT
BLADE SURG 10 STRL SS (BLADE) IMPLANT
BLADE SURG 15 STRL LF DISP TIS (BLADE) ×1 IMPLANT
BLADE SURG 15 STRL SS (BLADE) ×2
CANISTER SUCT 1200ML W/VALVE (MISCELLANEOUS) IMPLANT
CHLORAPREP W/TINT 26ML (MISCELLANEOUS) ×3 IMPLANT
CLOSURE WOUND 1/2 X4 (GAUZE/BANDAGES/DRESSINGS)
COVER BACK TABLE 60X90IN (DRAPES) ×3 IMPLANT
COVER MAYO STAND STRL (DRAPES) ×3 IMPLANT
DECANTER SPIKE VIAL GLASS SM (MISCELLANEOUS) IMPLANT
DRAPE LAPAROTOMY 100X72 PEDS (DRAPES) ×3 IMPLANT
DRAPE UTILITY XL STRL (DRAPES) ×3 IMPLANT
ELECT COATED BLADE 2.86 ST (ELECTRODE) ×3 IMPLANT
ELECT REM PT RETURN 9FT ADLT (ELECTROSURGICAL) ×3
ELECTRODE REM PT RTRN 9FT ADLT (ELECTROSURGICAL) ×1 IMPLANT
GLOVE BIOGEL PI IND STRL 7.0 (GLOVE) ×1 IMPLANT
GLOVE BIOGEL PI IND STRL 8 (GLOVE) ×3 IMPLANT
GLOVE BIOGEL PI INDICATOR 7.0 (GLOVE) ×2
GLOVE BIOGEL PI INDICATOR 8 (GLOVE) ×6
GLOVE ECLIPSE 8.0 STRL XLNG CF (GLOVE) ×3 IMPLANT
GLOVE SURG SS PI 6.5 STRL IVOR (GLOVE) ×3 IMPLANT
GLOVE SURG SS PI 7.5 STRL IVOR (GLOVE) ×6 IMPLANT
GOWN STRL REUS W/ TWL LRG LVL3 (GOWN DISPOSABLE) ×4 IMPLANT
GOWN STRL REUS W/TWL LRG LVL3 (GOWN DISPOSABLE) ×8
LIQUID BAND (GAUZE/BANDAGES/DRESSINGS) IMPLANT
NEEDLE HYPO 25X1 1.5 SAFETY (NEEDLE) ×3 IMPLANT
NS IRRIG 1000ML POUR BTL (IV SOLUTION) IMPLANT
PACK BASIN DAY SURGERY FS (CUSTOM PROCEDURE TRAY) ×3 IMPLANT
PENCIL BUTTON HOLSTER BLD 10FT (ELECTRODE) ×3 IMPLANT
SLEEVE SCD COMPRESS KNEE MED (MISCELLANEOUS) ×3 IMPLANT
SPONGE LAP 4X18 X RAY DECT (DISPOSABLE) IMPLANT
STAPLER VISISTAT 35W (STAPLE) IMPLANT
STRIP CLOSURE SKIN 1/2X4 (GAUZE/BANDAGES/DRESSINGS) IMPLANT
SUT MON AB 4-0 PC3 18 (SUTURE) ×3 IMPLANT
SUT VICRYL 3-0 CR8 SH (SUTURE) ×3 IMPLANT
SUT VICRYL AB 3 0 TIES (SUTURE) IMPLANT
SYR CONTROL 10ML LL (SYRINGE) ×3 IMPLANT
TOWEL OR 17X24 6PK STRL BLUE (TOWEL DISPOSABLE) ×6 IMPLANT
TOWEL OR NON WOVEN STRL DISP B (DISPOSABLE) ×3 IMPLANT
TUBE CONNECTING 20'X1/4 (TUBING)
TUBE CONNECTING 20X1/4 (TUBING) IMPLANT
YANKAUER SUCT BULB TIP NO VENT (SUCTIONS) IMPLANT

## 2015-04-21 NOTE — Interval H&P Note (Signed)
History and Physical Interval Note:  04/21/2015 7:14 AM  Marie Holmes  has presented today for surgery, with the diagnosis of Right thigh mass  The various methods of treatment have been discussed with the patient and family. After consideration of risks, benefits and other options for treatment, the patient has consented to  Procedure(s): EXCISION RIGHT THIGH  MASS (Right) as a surgical intervention .  The patient's history has been reviewed, patient examined, no change in status, stable for surgery.  I have reviewed the patient's chart and labs.  Questions were answered to the patient's satisfaction.     Ester Hilley A.

## 2015-04-21 NOTE — Anesthesia Postprocedure Evaluation (Signed)
Anesthesia Post Note  Patient: Marie Holmes  Procedure(s) Performed: Procedure(s) (LRB): EXCISION RIGHT THIGH  MASS (Right)  Patient location during evaluation: PACU Anesthesia Type: General Level of consciousness: awake and alert Pain management: pain level controlled Vital Signs Assessment: post-procedure vital signs reviewed and stable Respiratory status: spontaneous breathing, nonlabored ventilation and respiratory function stable Cardiovascular status: blood pressure returned to baseline and stable Postop Assessment: no signs of nausea or vomiting Anesthetic complications: no    Last Vitals:  Filed Vitals:   04/21/15 0845 04/21/15 0909  BP: 115/57 121/54  Pulse: 80 83  Temp:  36.4 C  Resp: 19 18    Last Pain:  Filed Vitals:   04/21/15 0911  PainSc: 5                  Treylon Henard A

## 2015-04-21 NOTE — Anesthesia Procedure Notes (Signed)
Procedure Name: LMA Insertion Date/Time: 04/21/2015 8:32 AM Performed by: Mechele Claude Pre-anesthesia Checklist: Patient identified, Emergency Drugs available, Suction available and Patient being monitored Patient Re-evaluated:Patient Re-evaluated prior to inductionOxygen Delivery Method: Circle System Utilized Preoxygenation: Pre-oxygenation with 100% oxygen Intubation Type: IV induction Ventilation: Mask ventilation without difficulty LMA: LMA inserted LMA Size: 4.0 Number of attempts: 1 Airway Equipment and Method: bite block Placement Confirmation: positive ETCO2 Tube secured with: Tape Dental Injury: Teeth and Oropharynx as per pre-operative assessment

## 2015-04-21 NOTE — Anesthesia Preprocedure Evaluation (Signed)
Anesthesia Evaluation  Patient identified by MRN, date of birth, ID band Patient awake    Reviewed: Allergy & Precautions, NPO status , Patient's Chart, lab work & pertinent test results  Airway Mallampati: I  TM Distance: >3 FB Neck ROM: Full    Dental  (+) Upper Dentures, Lower Dentures, Dental Advisory Given   Pulmonary COPD,  COPD inhaler, Current Smoker,    breath sounds clear to auscultation       Cardiovascular hypertension, Pt. on medications +CHF   Rhythm:Regular Rate:Normal     Neuro/Psych    GI/Hepatic GERD  Medicated and Controlled,  Endo/Other  diabetes, Well Controlled, Type 2, Insulin Dependent  Renal/GU      Musculoskeletal   Abdominal   Peds  Hematology   Anesthesia Other Findings   Reproductive/Obstetrics                             Anesthesia Physical Anesthesia Plan  ASA: III  Anesthesia Plan: General   Post-op Pain Management:    Induction: Intravenous  Airway Management Planned: LMA  Additional Equipment:   Intra-op Plan:   Post-operative Plan: Extubation in OR  Informed Consent: I have reviewed the patients History and Physical, chart, labs and discussed the procedure including the risks, benefits and alternatives for the proposed anesthesia with the patient or authorized representative who has indicated his/her understanding and acceptance.   Dental advisory given  Plan Discussed with: CRNA, Anesthesiologist and Surgeon  Anesthesia Plan Comments:         Anesthesia Quick Evaluation

## 2015-04-21 NOTE — Op Note (Signed)
Preoperative diagnosis: Painful right medial thigh lipoma 2  Postoperative diagnosis: Same  Procedure: Excision of right thigh lipoma 2 each measuring 2 cm subcutaneous  Surgeon: Erroll Luna M.D.  Anesthesia: LMA with 0.25% Sensorcaine local with epinephrine  EBL: Minimal  Specimens: Fragments of to right medial thigh lipoma to pathology  Indications for procedure: The patient presents for excision of to subcutaneous painful 5 masses. She has a history of multiple lipoma and these clinically were felt to be lipoma as well. She has had some chronic leg pain which I feel is unrelated to these lipoma. The lipoma were tender to touch. She underwent venous duplex of the right leg which showed no blood clots. Clinically, she has significant superficial varicose veins. I told her that removal of these masses would not resolve her leg pain completely but may help some. She understood this but wished to proceed with excision of lipoma.The procedure has been discussed with the patient.  Alternative therapies have been discussed with the patient.  Operative risks include bleeding,  Infection,  Organ injury,  Nerve injury,  Blood vessel injury,  DVT,  Pulmonary embolism,  Death,  And possible reoperation.  Medical management risks include worsening of present situation.  The success of the procedure is 50 -90 % at treating patients symptoms.  The patient understands and agrees to proceed.  Description of procedure: The patient was met in the holding area and the areas to be excised in the right medial thigh were marked with the assistance of the patient. A box was placed around both marks. The procedure was discussed the patient agreed to proceed. She was taken back to the operating room and placed supine on the OR table. After induction of LMA anesthesia, the right medial thigh was prepped and draped in a sterile fashion and timeout was done. She received preoperative antibiotics. The larger of the lipoma  was more anterior incision was made after infiltration of local anesthesia. This came back easily and resembled a lipoma that was benign. This was the subcutaneous fat. We was closed with 3-0 Vicryl and 4-0 Monocryl. The second area was more posterior and medial. Incision was made over this which was 2 cm. Lipoma was excised from the subcutaneous fat. The wound was closed with 3-0 Vicryl and 4-0 Monocryl. Liquid adhesive applied. All final counts found to be correct. Patient awoke extubated taken to recovery in satisfactory condition.

## 2015-04-21 NOTE — H&P (View-Only) (Signed)
Marie Holmes 03/27/2015 10:27 AM Location: [facility] Patient #: [id number] DOB: May 09, 1935 Widowed / Language: Cleophus Molt / Race: White Female   History of Present Illness Marcello Moores A. Tyner Codner MD; 03/27/2015 12:19 PM) Patient words: right leg mass/groin pain  Pt returns to the office due to a painful knot on the inside of her right medial upper thigh It has been present for the last couple of months no redness but very tender no leg swelling but she has a history of varicose veins.  The patient is a 80 year old female.   Problem List/Past Medical Ventura Sellers, [place]; 03/27/2015 10:27 AM) S/P LAPAROSCOPIC CHOLECYSTECTOMY (Z90.49) CALCULUS OF GALLBLADDER WITH CHRONIC CHOLECYSTITIS (K80.10)  Other Problems Ventura Sellers, CMA; 03/27/2015 10:27 AM) Anxiety Disorder Back Pain Chest pain Cholelithiasis Chronic Obstructive Lung Disease Congestive Heart Failure Diabetes Mellitus Hemorrhoids High blood pressure Kidney Stone Lung Cancer  Past Surgical History Ventura Sellers, [place]; 03/27/2015 10:28 AM) Appendectomy Colon Polyp Removal - Open Hysterectomy (not due to cancer) - Partial Resection of Stomach Tonsillectomy  Diagnostic Studies History Ventura Sellers, [place]; 03/27/2015 10:28 AM) Colonoscopy 1-5 years ago  Allergies Ventura Sellers, CMA; 03/27/2015 10:28 AM) Chantix *PSYCHOTHERAPEUTIC AND NEUROLOGICAL AGENTS - MISC.* Sulfo Lo *DERMATOLOGICALS* MetFORMIN HCl *ANTIDIABETICS*  Medication History Ventura Sellers, CMA; 03/27/2015 10:29 AM) ALPRAZolam (0.'5MG'$  Tablet, Oral daily) Active. Alendronate Sodium ('70MG'$  Tablet, Oral daily) Active. Citalopram Hydrobromide ('40MG'$  Tablet, Oral daily) Active. GlipiZIDE ('5MG'$  Tablet, Oral daily) Active. Furosemide ('40MG'$  Tablet, Oral daily) Active. Januvia ('50MG'$  Tablet, Oral daily) Active. Losartan Potassium ('50MG'$  Tablet, Oral daily) Active. Metoprolol Succinate ER ('100MG'$  Tablet ER 24HR, Oral  daily) Active. Naproxen ('500MG'$  Tablet, Oral daily) Active. Pantoprazole Sodium ('40MG'$  Tablet DR, Oral daily) Active. Victoza ('18MG'$ /3ML Soln Pen-inj, Subcutaneous daily) Active. TRUEtest Test (In Vitro daily) Active. Medications Reconciled  Social History Ventura Sellers, [place]; 03/27/2015 10:29 AM) Alcohol use Recently quit alcohol use. Caffeine use Carbonated beverages, Coffee. Tobacco use Former smoker.  Family History Ventura Sellers, [place]; 03/27/2015 10:29 AM) Heart Disease Brother. Respiratory Condition Mother.  Vitals [employer] R. Brooks CMA; 03/27/2015 10:27 AM) 03/27/2015 10:27 AM Weight: 130.25 lb Height: 60in Body Surface Area: 1.56 m Body Mass Index: 25.44 kg/m  BP: 134/72 (Sitting, Left Arm, Standard)       Physical Exam (Roman Dubuc A. Pedram Goodchild MD; 03/27/2015 12:21 PM) General Mental Status-Alert. General Appearance-Consistent with stated age. Hydration-Well hydrated. Voice-Normal.  Cardiovascular Note: multiple varicosities bilateral lower extremetiies  no edema 2 painful knots along the saphenous vein area medial right thigh no redness no drainage no ulcerations good perfusion warm extremeties   Neurologic Neurologic evaluation reveals -alert and oriented x 3 with no impairment of recent or remote memory. Mental Status-Normal.    Assessment & Plan (Sehar Sedano A. Keilan Nichol MD; 03/27/2015 12:23 PM) MASS OF RIGHT THIGH (R22.41) Impression: SEE BELOW DUPLEX TO EVALUATE SAPHENOUS VEIN RIGHT VARICOSE VEINS WITH PAIN (I83.819)   MASS tender and patient requests excision since duplex normal  The procedure has been discussed with the patient.  Alternative therapies have been discussed with the patient.  Operative risks include bleeding,  Infection,  Organ injury,  Nerve injury,  Blood vessel injury,  DVT,  Pulmonary embolism,  Death,  And possible reoperation.  Medical management risks include worsening of present situation.  The success of the  procedure is 50 -90 % at treating patients symptoms.  The patient understands and agrees to proceed. if duplex not helpful, made need further imaging vs excison in  OR. Current Plans Pt Education - [facility] Free Text Education/Instructions: discussed with patient and provided information.

## 2015-04-21 NOTE — Transfer of Care (Signed)
  Last Vitals:  Filed Vitals:   04/21/15 0634  BP: 132/55  Pulse: 68  Temp: 36.7 C  Resp: 20   Immediate Anesthesia Transfer of Care Note  Patient: Marie Holmes  Procedure(s) Performed: Procedure(s) (LRB): EXCISION RIGHT THIGH  MASS (Right)  Patient Location: PACU  Anesthesia Type: General  Level of Consciousness: awake, alert  and oriented  Airway & Oxygen Therapy: Patient Spontanous Breathing and Patient connected to face mask oxygen  Post-op Assessment: Report given to PACU RN and Post -op Vital signs reviewed and stable  Post vital signs: Reviewed and stable  Complications: No apparent anesthesia complications

## 2015-04-21 NOTE — Discharge Instructions (Signed)
GENERAL SURGERY: POST OP INSTRUCTIONS ° °1. DIET: Follow a light bland diet the first 24 hours after arrival home, such as soup, liquids, crackers, etc.  Be sure to include lots of fluids daily.  Avoid fast food or heavy meals as your are more likely to get nauseated.   °2. Take your usually prescribed home medications unless otherwise directed. °3. PAIN CONTROL: °a. Pain is best controlled by a usual combination of three different methods TOGETHER: °i. Ice/Heat °ii. Over the counter pain medication °iii. Prescription pain medication °b. Most patients will experience some swelling and bruising around the incisions.  Ice packs or heating pads (30-60 minutes up to 6 times a day) will help. Use ice for the first few days to help decrease swelling and bruising, then switch to heat to help relax tight/sore spots and speed recovery.  Some people prefer to use ice alone, heat alone, alternating between ice & heat.  Experiment to what works for you.  Swelling and bruising can take several weeks to resolve.   °c. It is helpful to take an over-the-counter pain medication regularly for the first few weeks.  Choose one of the following that works best for you: °i. Naproxen (Aleve, etc)  Two 220mg tabs twice a day °ii. Ibuprofen (Advil, etc) Three 200mg tabs four times a day (every meal & bedtime) °iii. Acetaminophen (Tylenol, etc) 500-650mg four times a day (every meal & bedtime) °d. A  prescription for pain medication (such as oxycodone, hydrocodone, etc) should be given to you upon discharge.  Take your pain medication as prescribed.  °i. If you are having problems/concerns with the prescription medicine (does not control pain, nausea, vomiting, rash, itching, etc), please call us [phone] to see if we need to switch you to a different pain medicine that will work better for you and/or control your side effect better. °ii. If you need a refill on your pain medication, please contact your pharmacy.  They will contact our  office to request authorization. Prescriptions will not be filled after 5 pm or on week-ends. °4. Avoid getting constipated.  Between the surgery and the pain medications, it is common to experience some constipation.  Increasing fluid intake and taking a fiber supplement (such as Metamucil, Citrucel, FiberCon, MiraLax, etc) 1-2 times a day regularly will usually help prevent this problem from occurring.  A mild laxative (prune juice, Milk of Magnesia, MiraLax, etc) should be taken according to package directions if there are no bowel movements after 48 hours.   °5. Wash / shower every day.  You may shower over the dressings as they are waterproof.  Continue to shower over incision(s) after the dressing is off. °6. Remove your waterproof bandages 5 days after surgery.  You may leave the incision open to air.  You may have skin tapes (Steri Strips) covering the incision(s).  Leave them on until one week, then remove.  You may replace a dressing/Band-Aid to cover the incision for comfort if you wish.  ° ° ° ° °7. ACTIVITIES as tolerated:   °a. You may resume regular (light) daily activities beginning the next day--such as daily self-care, walking, climbing stairs--gradually increasing activities as tolerated.  If you can walk 30 minutes without difficulty, it is safe to try more intense activity such as jogging, treadmill, bicycling, low-impact aerobics, swimming, etc. °b. Save the most intensive and strenuous activity for last such as sit-ups, heavy lifting, contact sports, etc  Refrain from any heavy lifting or straining until you   are off narcotics for pain control.   °c. DO NOT PUSH THROUGH PAIN.  Let pain be your guide: If it hurts to do something, don't do it.  Pain is your body warning you to avoid that activity for another week until the pain goes down. °d. You may drive when you are no longer taking prescription pain medication, you can comfortably wear a seatbelt, and you can safely maneuver your car and  apply brakes. °e. You may have sexual intercourse when it is comfortable.  °8. FOLLOW UP in our office °a. Please call [facility] at [phone] to set up an appointment to see your surgeon in the office for a follow-up appointment approximately 2-3 weeks after your surgery. °b. Make sure that you call for this appointment the day you arrive home to insure a convenient appointment time. °9. IF YOU HAVE DISABILITY OR FAMILY LEAVE FORMS, BRING THEM TO THE OFFICE FOR PROCESSING.  DO NOT GIVE THEM TO YOUR DOCTOR. ° ° °WHEN TO CALL US [phone]: °1. Poor pain control °2. Reactions / problems with new medications (rash/itching, nausea, etc)  °3. Fever over 101.5 F (38.5 C) °4. Worsening swelling or bruising °5. Continued bleeding from incision. °6. Increased pain, redness, or drainage from the incision °7. Difficulty breathing / swallowing ° ° The clinic staff is available to answer your questions during regular business hours (8:30am-5pm).  Please don’t hesitate to call and ask to speak to one of our nurses for clinical concerns.  ° If you have a medical emergency, go to the nearest emergency room or call 911. ° A surgeon from [facility] is always on call at the hospitals ° ° °[facility] °[address], [address], [place], [place]  [address] ? °MAIN: [phone] ? TOLL FREE: [phone] ?  °FAX [phone] °[email or web address] ° °Post Anesthesia Home Care Instructions ° °Activity: °Get plenty of rest for the remainder of the day. A responsible adult should stay with you for 24 hours following the procedure.  °For the next 24 hours, DO NOT: °-Drive a car °-Operate machinery °-Drink alcoholic beverages °-Take any medication unless instructed by your physician °-Make any legal decisions or sign important papers. ° °Meals: °Start with liquid foods such as gelatin or soup. Progress to regular foods as tolerated. Avoid greasy, spicy, heavy foods. If nausea and/or  vomiting occur, drink only clear liquids until the nausea and/or vomiting subsides. Call your physician if vomiting continues. ° °Special Instructions/Symptoms: °Your throat may feel dry or sore from the anesthesia or the breathing tube placed in your throat during surgery. If this causes discomfort, gargle with warm salt water. The discomfort should disappear within 24 hours. ° °If you had a scopolamine patch placed behind your ear for the management of post- operative nausea and/or vomiting: ° °1. The medication in the patch is effective for 72 hours, after which it should be removed.  Wrap patch in a tissue and discard in the trash. Wash hands thoroughly with soap and water. °2. You may remove the patch earlier than 72 hours if you experience unpleasant side effects which may include dry mouth, dizziness or visual disturbances. °3. Avoid touching the patch. Wash your hands with soap and water after contact with the patch. °  ° °

## 2015-04-22 ENCOUNTER — Encounter: Attending: Family | Primary: Family Medicine

## 2015-04-22 ENCOUNTER — Ambulatory Visit: Payer: PPO | Admitting: Internal Medicine

## 2015-04-22 ENCOUNTER — Encounter (HOSPITAL_BASED_OUTPATIENT_CLINIC_OR_DEPARTMENT_OTHER): Payer: Self-pay | Admitting: Surgery

## 2015-04-22 LAB — GLUCOSE, CAPILLARY: Glucose-Capillary: 130 mg/dL — ABNORMAL HIGH (ref 65–99)

## 2015-04-27 ENCOUNTER — Other Ambulatory Visit: Payer: PPO

## 2015-04-28 ENCOUNTER — Ambulatory Visit (HOSPITAL_COMMUNITY)
Admission: RE | Admit: 2015-04-28 | Discharge: 2015-04-28 | Disposition: A | Discharge status: Home / Self Care | Payer: PPO | Source: Ambulatory Visit | Attending: Internal Medicine | Admitting: Internal Medicine

## 2015-04-28 ENCOUNTER — Other Ambulatory Visit: Payer: Self-pay | Admitting: *Deleted

## 2015-04-28 ENCOUNTER — Other Ambulatory Visit (HOSPITAL_BASED_OUTPATIENT_CLINIC_OR_DEPARTMENT_OTHER): Payer: PPO

## 2015-04-28 DIAGNOSIS — I2510 Atherosclerotic heart disease of native coronary artery without angina pectoris: Secondary | ICD-10-CM | POA: Insufficient documentation

## 2015-04-28 DIAGNOSIS — C8200 Follicular lymphoma grade I, unspecified site: Secondary | ICD-10-CM

## 2015-04-28 DIAGNOSIS — D47Z9 Other specified neoplasms of uncertain behavior of lymphoid, hematopoietic and related tissue: Secondary | ICD-10-CM

## 2015-04-28 DIAGNOSIS — R918 Other nonspecific abnormal finding of lung field: Secondary | ICD-10-CM | POA: Insufficient documentation

## 2015-04-28 DIAGNOSIS — R0602 Shortness of breath: Secondary | ICD-10-CM | POA: Diagnosis not present

## 2015-04-28 DIAGNOSIS — C8590 Non-Hodgkin lymphoma, unspecified, unspecified site: Secondary | ICD-10-CM | POA: Insufficient documentation

## 2015-04-28 DIAGNOSIS — Z923 Personal history of irradiation: Secondary | ICD-10-CM | POA: Insufficient documentation

## 2015-04-28 DIAGNOSIS — J432 Centrilobular emphysema: Secondary | ICD-10-CM | POA: Diagnosis not present

## 2015-04-28 LAB — CBC WITH DIFFERENTIAL/PLATELET
BASO%: 0.4 % (ref 0.0–2.0)
Basophils Absolute: 0 10*3/uL (ref 0.0–0.1)
EOS%: 3 % (ref 0.0–7.0)
Eosinophils Absolute: 0.3 10*3/uL (ref 0.0–0.5)
HCT: 42.7 % (ref 34.8–46.6)
HGB: 14.4 g/dL (ref 11.6–15.9)
LYMPH%: 23.4 % (ref 14.0–49.7)
MCH: 30.8 pg (ref 25.1–34.0)
MCHC: 33.7 g/dL (ref 31.5–36.0)
MCV: 91.2 fL (ref 79.5–101.0)
MONO#: 1 10*3/uL — ABNORMAL HIGH (ref 0.1–0.9)
MONO%: 10.7 % (ref 0.0–14.0)
NEUT#: 5.8 10*3/uL (ref 1.5–6.5)
NEUT%: 62.5 % (ref 38.4–76.8)
PLATELETS: 264 10*3/uL (ref 145–400)
RBC: 4.68 10*6/uL (ref 3.70–5.45)
RDW: 13.8 % (ref 11.2–14.5)
WBC: 9.3 10*3/uL (ref 3.9–10.3)
lymph#: 2.2 10*3/uL (ref 0.9–3.3)

## 2015-04-28 LAB — COMPREHENSIVE METABOLIC PANEL
ANION GAP: 9 meq/L (ref 3–11)
AST: 12 U/L (ref 5–34)
Albumin: 3.7 g/dL (ref 3.5–5.0)
Alkaline Phosphatase: 91 U/L (ref 40–150)
BUN: 16.3 mg/dL (ref 7.0–26.0)
CHLORIDE: 106 meq/L (ref 98–109)
CO2: 26 meq/L (ref 22–29)
CREATININE: 0.9 mg/dL (ref 0.6–1.1)
Calcium: 9.7 mg/dL (ref 8.4–10.4)
EGFR: 58 mL/min/{1.73_m2} — ABNORMAL LOW (ref >90)
Glucose: 145 mg/dl — ABNORMAL HIGH (ref 70–140)
POTASSIUM: 5.3 meq/L — AB (ref 3.5–5.1)
Sodium: 141 mEq/L (ref 136–145)
Total Bilirubin: 0.78 mg/dL (ref 0.20–1.20)
Total Protein: 6.8 g/dL (ref 6.4–8.3)

## 2015-04-28 MED ORDER — IOHEXOL 300 MG/ML  SOLN
75.0000 mL | Freq: Once | INTRAMUSCULAR | Status: AC | PRN
Start: 2015-04-28 — End: 2015-04-28
  Administered 2015-04-28: 75 mL via INTRAVENOUS

## 2015-04-30 ENCOUNTER — Encounter

## 2015-05-04 ENCOUNTER — Ambulatory Visit (HOSPITAL_BASED_OUTPATIENT_CLINIC_OR_DEPARTMENT_OTHER): Payer: PPO | Admitting: Internal Medicine

## 2015-05-04 ENCOUNTER — Encounter: Payer: Self-pay | Admitting: Internal Medicine

## 2015-05-04 VITALS — BP 151/51 | HR 66 | Temp 97.6°F | Resp 18 | Ht 60.0 in | Wt 131.1 lb

## 2015-05-04 DIAGNOSIS — Z8572 Personal history of non-Hodgkin lymphomas: Secondary | ICD-10-CM | POA: Diagnosis not present

## 2015-05-04 DIAGNOSIS — I10 Essential (primary) hypertension: Secondary | ICD-10-CM

## 2015-05-04 DIAGNOSIS — D179 Benign lipomatous neoplasm, unspecified: Secondary | ICD-10-CM | POA: Insufficient documentation

## 2015-05-04 DIAGNOSIS — J449 Chronic obstructive pulmonary disease, unspecified: Secondary | ICD-10-CM | POA: Diagnosis not present

## 2015-05-04 DIAGNOSIS — D174 Benign lipomatous neoplasm of intrathoracic organs: Secondary | ICD-10-CM

## 2015-05-04 DIAGNOSIS — E119 Type 2 diabetes mellitus without complications: Secondary | ICD-10-CM | POA: Diagnosis not present

## 2015-05-04 DIAGNOSIS — C8207 Follicular lymphoma grade I, spleen: Secondary | ICD-10-CM

## 2015-05-04 NOTE — Progress Notes (Signed)
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 Sondra Come completed on 05/08/2012.  CURRENT THERAPY: Observation  INTERVAL HISTORY: Marie Holmes 80 y.o. female returns to the clinic today for followup visit accompanied by her daughter. 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 She had several other areas on the arms. The patient is feeling fine today with no specific complaints except for mild fatigue. She denied having any significant chest pain, cough, shortness of breath or hemoptysis. She has no significant weight loss or night sweats. She has repeat CT scan of the chest for evaluation of her disease and she is here for discussion of her scan results and recommendation regarding her condition.  MEDICAL HISTORY: Past Medical History  Diagnosis Date  . Benign tumor of breast 2004    left  . Broken ribs     history of several broken ribs on right and compression  fx  . Fibroma of skin     Multiple fibromas/lipomas on arms and legs B  . Anxiety   . COPD (chronic obstructive pulmonary disease) ([place])   . Hyperlipidemia   . History of CT scan of abdomen 05/23/2003    Mild fatty liver inf, tiny hypodensity in right lobe of liver  . Pulmonary nodules   . Diabetes mellitus without complication ([place])   . Hx of radiation therapy 04/11/12- 05/08/12    left chest wall area, 40 gray 20 fx  . Hypertension   . Diverticulosis   . Adenomatous colon polyp   . Cancer ([place])     non-hodgkins b cell lymphoma  . Lung cancer ([place])     pulmonary lymphoid neoplasm questionable for non-Hodgkins  lymphoma  . Depression   . Pyelonephritis   . Kidney stone 02/21/1981    Right/removed  . GERD (gastroesophageal reflux disease)   . Osteoarthritis     hands, degenerative back , osteoporosis   . Cold virus     seen by Dr. Deborra Medina on 10/12, given Rx for antibiotic & steroid  . CHF (congestive heart failure) ([place])     hosp. for CHF- 10/2013  . Shortness of breath     only with movemnent   . C. difficile colitis 06/21/2014  . UTI (lower urinary tract infection)   . Palpitations   . Tremor   . Weight loss     "multifactorial" per PMD note  . Chronic abdominal pain   . Gait instability     ALLERGIES:  is allergic to sulfonamide derivatives and metformin and related.  MEDICATIONS:  Current Outpatient Prescriptions  Medication Sig Dispense Refill  . albuterol (PROVENTIL HFA;VENTOLIN HFA) 108 (90 BASE) MCG/ACT inhaler Inhale 2 puffs into the lungs every 6 (six) hours as needed for wheezing or shortness of breath. 1 Inhaler 2  . ALPRAZolam (XANAX) 0.5 MG tablet TAKE ONE TABLET BY MOUTH THREE TIMES DAILY AS NEEDED FOR ANXIETY 90 tablet 0  . aspirin EC 81 MG tablet Take 81 mg by mouth every morning.    . Blood Glucose Monitoring Suppl ([place]) W/DEVICE KIT Use as directed to test blood sugar three  times daily Dx:R73.09 1 each 0  . budesonide-formoterol (SYMBICORT) 160-4.5 MCG/ACT inhaler Inhale 2 puffs into the lungs 2 (two) times daily. 1 Inhaler 12  . citalopram (CELEXA) 40 MG tablet Take 1 tablet (40 mg total) by mouth daily. 90 tablet 3  . HYDROcodone-acetaminophen (NORCO) 5-325 MG tablet Take 1 tablet by mouth every 6 (six) hours as needed for moderate pain. 30 tablet 0  . insulin aspart (NOVOLOG) 100 UNIT/ML injection Inject 6 Units into the skin 3 (three) times daily before meals. 10 mL 5  . Insulin Detemir (LEVEMIR FLEXTOUCH) 100 UNIT/ML Pen Inject 16 Units into the skin daily at 10 pm. 15 mL 2  . Insulin Pen Needle 32G X 4 MM MISC Use to inject 4x daily. Relion Brand  130 each 4  . Insulin Syringe-Needle U-100 (INSULIN SYRINGE .3CC/31GX5/16") 31G X 5/16" 0.3 ML MISC Use to inject insulin 3 times daily as instructed. 100 each 5  . losartan (COZAAR) 50 MG tablet TAKE ONE-HALF TABLET BY MOUTH ONCE DAILY FOR 4 DAYS ,  THEN  INCREASE  DOSE  TO  ONE  WHOLE  TABLET  DAILY  AS  PREVIOUSLY  PRESCRIBED. 90 tablet 3  . metoprolol succinate (TOPROL-XL) 50 MG 24 hr tablet TAKE ONE TABLET BY MOUTH ONCE DAILY-- TAKE WITH, OR IMMEDIATELY FOLLOWING A MEAL 30 tablet 5  . ONE TOUCH LANCETS MISC Use as directed to test blood sugar three times daily Dx:R73.09 300 each 2  . ONE TOUCH ULTRA TEST test strip USE ONE STRIP TO CHECK GLUCOSE THREE TIMES DAILY 100 each 11  . vitamin B-12 (CYANOCOBALAMIN) 500 MCG tablet Take 500 mcg by mouth daily.     No current facility-administered medications for this visit.    SURGICAL HISTORY:  Past Surgical History  Procedure Laterality Date  . Appendectomy  02/21/1961  . Exploratory laparotomy  02/21/1970    for ? adhesions  . Abdominal hysterectomy  02/21/1970    right ovary remaining  . Bone marrow biopsy  03/09/2012  . Lung core biopsy  02/06/2012  . Tonsillectomy    . Breast surgery  1970's    lump removed- benign   . Tubal ligation    . Eye surgery      bilateral cataracts removed - /W- IOL  . Cholecystectomy N/A 12/11/2013    Procedure: LAPAROSCOPIC CHOLECYSTECTOMY WITH INTRAOPERATIVE CHOLANGIOGRAM;  Surgeon: Erroll Luna, MD;  Location: [facility];  Service: General;  Laterality: N/A;  . Mass excision Right 04/21/2015    Procedure: EXCISION RIGHT THIGH  MASS;  Surgeon: Erroll Luna, MD;  Location: [facility];  Service: General;  Laterality: Right;    REVIEW OF SYSTEMS:  A comprehensive review of systems was negative except for: Constitutional: positive for fatigue   PHYSICAL EXAMINATION: General appearance: alert, cooperative and no distress Head: Normocephalic, without obvious abnormality, atraumatic Neck: no  adenopathy Lymph nodes: Cervical, supraclavicular, and axillary nodes normal. Resp: clear to auscultation bilaterally Cardio: regular rate and rhythm, S1, S2 normal, no murmur, click, rub or gallop GI: soft, non-tender; bowel sounds normal; no masses,  no organomegaly Extremities: extremities normal, atraumatic, no cyanosis or edema Skin exam: Several small mobile subcutaneous nodule under the skin.  ECOG PERFORMANCE STATUS: 0 - Asymptomatic  There were no vitals taken for this visit.  LABORATORY DATA: Lab Results  Component Value Date   WBC 9.3 04/28/2015   HGB 14.4 04/28/2015   HCT 42.7 04/28/2015   MCV 91.2 04/28/2015   PLT 264 04/28/2015  Chemistry      Component Value Date/Time   NA 141 04/28/2015 0954   NA 140 04/17/2015 1134   K 5.3* 04/28/2015 0954   K 4.3 04/17/2015 1134   CL 102 04/17/2015 1134   CL 103 06/19/2012 0931   CO2 26 04/28/2015 0954   CO2 29 04/17/2015 1134   BUN 16.3 04/28/2015 0954   BUN 14 04/17/2015 1134   CREATININE 0.9 04/28/2015 0954   CREATININE 0.81 04/17/2015 1134   CREATININE 0.73 01/16/2012 1633      Component Value Date/Time   CALCIUM 9.7 04/28/2015 0954   CALCIUM 9.2 04/17/2015 1134   ALKPHOS 91 04/28/2015 0954   ALKPHOS 79 03/04/2015 0842   AST 12 04/28/2015 0954   AST 14 03/04/2015 0842   ALT <9 04/28/2015 0954   ALT 11 03/04/2015 0842   BILITOT 0.78 04/28/2015 0954   BILITOT 0.5 03/04/2015 0842       RADIOGRAPHIC STUDIES: Ct Chest W Contrast  04/28/2015  CLINICAL DATA:  80 year old female with history of non-Hodgkin's lymphoma diagnosed in 2014. Followup study. Shortness breath for the past 3 years. History of COPD) former smoker). EXAM: CT CHEST WITH CONTRAST TECHNIQUE: Multidetector CT imaging of the chest was performed during intravenous contrast administration. CONTRAST:  23m OMNIPAQUE IOHEXOL 300 MG/ML  SOLN COMPARISON:  Chest CT 04/14/2014. FINDINGS: Mediastinum/Lymph Nodes: Heart size is normal. There is no  significant pericardial fluid, thickening or pericardial calcification. There is atherosclerosis of the thoracic aorta, the great vessels of the mediastinum and the coronary arteries, including calcified atherosclerotic plaque in the left main and right coronary arteries. No pathologically enlarged mediastinal or hilar lymph nodes. Esophagus is unremarkable in appearance. No axillary lymphadenopathy. Lungs/Pleura: There is some peripheral subpleural reticulation in the lateral aspect of the left lower lobe at the site of the previously irradiated ground-glass nodule, most compatible with some mild postradiation fibrosis. This is very similar to the prior study 04/14/2014. A few scattered 1-3 mm pulmonary nodules are again noted throughout the lungs bilaterally, unchanged in size, number and distribution compared to prior examinations, considered benign. There are no other suspicious appearing pulmonary nodules or masses noted on today's examination. No acute consolidative airspace disease. No pleural effusions. Mild diffuse bronchial wall thickening with mild centrilobular emphysema. Upper Abdomen: Status post cholecystectomy.  Atherosclerosis. Musculoskeletal/Soft Tissues: There are no aggressive appearing lytic or blastic lesions noted in the visualized portions of the skeleton. IMPRESSION: 1. No findings to suggest residual or recurrent disease in the thorax on today's examination. 2. Stable post radiation treatment changes in the periphery of the left lower lobe. 3. Atherosclerosis, including left main and right coronary artery disease. Assessment for potential risk factor modification, dietary therapy or pharmacologic therapy may be warranted, if clinically indicated. 4. Mild diffuse bronchial thickening with mild centrilobular emphysema; imaging findings suggestive of underlying COPD. Electronically Signed   By: DVinnie LangtonM.D.   On: 04/28/2015 12:35    ASSESSMENT AND PLAN:  1) Pulmonary lymphoid  neoplasm questionable for non-Hodgkin lymphoma involving the left lower lobe of the lung: Status post definitive radiotherapy under the care of Dr. KSondra Come  The recent CT scan of the chest showed no findings to suggest residual or recurrent disease in the thorax. I discussed the scan results with the patient and her daughter. I recommended for her to continue on observation with routine follow-up visit with her primary care physician. I would be happy to see her in the future on as-needed basis. 2) COPD: Continue  current treatment with albuterol inhaler. 3) hypertension: Currently on Cozaar and Toprol-XL. 4) diabetes mellitus: She is currently on treatment with glipizide and Januvia.  She was advised to call immediately if she has any concerning symptoms in the interval.  All questions were answered. The patient knows to call the clinic with any problems, questions or concerns. We can certainly see the patient much sooner if necessary.  Disclaimer: This note was dictated with voice recognition software. Similar sounding words can inadvertently be transcribed and may be missed upon review.

## 2015-05-07 ENCOUNTER — Other Ambulatory Visit: Payer: Self-pay | Admitting: Family Medicine

## 2015-05-08 ENCOUNTER — Inpatient Hospital Stay: Admit: 2015-05-08 | Payer: MEDICARE | Attending: Orthopaedic Surgery | Primary: Family Medicine

## 2015-05-08 DIAGNOSIS — M4856XA Collapsed vertebra, not elsewhere classified, lumbar region, initial encounter for fracture: Secondary | ICD-10-CM

## 2015-05-08 NOTE — Telephone Encounter (Signed)
Last f/u 02/2015-CPE

## 2015-05-08 NOTE — Telephone Encounter (Signed)
Rx called in to requested pharmacy 

## 2015-06-08 ENCOUNTER — Other Ambulatory Visit: Payer: Self-pay | Admitting: Family Medicine

## 2015-06-08 DIAGNOSIS — L821 Other seborrheic keratosis: Secondary | ICD-10-CM | POA: Diagnosis not present

## 2015-06-08 DIAGNOSIS — L570 Actinic keratosis: Secondary | ICD-10-CM | POA: Diagnosis not present

## 2015-06-08 DIAGNOSIS — L812 Freckles: Secondary | ICD-10-CM | POA: Diagnosis not present

## 2015-06-09 NOTE — Telephone Encounter (Signed)
Rx called in to requested pharmacy 

## 2015-06-09 NOTE — Telephone Encounter (Signed)
Last f/u 02/2014.

## 2015-06-29 DIAGNOSIS — R251 Tremor, unspecified: Secondary | ICD-10-CM | POA: Diagnosis not present

## 2015-06-29 DIAGNOSIS — I10 Essential (primary) hypertension: Secondary | ICD-10-CM | POA: Diagnosis not present

## 2015-06-29 DIAGNOSIS — F419 Anxiety disorder, unspecified: Secondary | ICD-10-CM | POA: Diagnosis not present

## 2015-06-29 DIAGNOSIS — E1165 Type 2 diabetes mellitus with hyperglycemia: Secondary | ICD-10-CM | POA: Diagnosis not present

## 2015-07-06 ENCOUNTER — Other Ambulatory Visit: Payer: Self-pay | Admitting: Family Medicine

## 2015-07-07 NOTE — Telephone Encounter (Signed)
Pt requesting a refill. Last OV 12/30/14, last refill 06/09/15. Ok to refill?

## 2015-07-08 ENCOUNTER — Telehealth: Payer: Self-pay | Admitting: Family Medicine

## 2015-07-08 NOTE — Telephone Encounter (Signed)
Patient called asking about her rx.  Please call patient back at [phone].  Patient's out of her medication.

## 2015-07-09 NOTE — Telephone Encounter (Signed)
Rx called in to requested pharmacy 

## 2015-07-09 NOTE — Telephone Encounter (Signed)
Pt says [facility] still stating they do not have her prescription for Alprozolam, [facility] will recheck for pt / lt Medication   ALPRAZolam (XANAX) 0.25 MG tablet [[id number]]       ALPRAZolam (XANAX) 0.25 MG tablet [[id number]]     Order Details    Dose, Route, Frequency: As Directed    Dispense Quantity:  90 tablet Refills:  0 Fills Remaining:  0          Sig: TAKE ONE TABLET BY MOUTH THREE TIMES DAILY AS NEEDED FOR ANXIETY         Written Date:  07/08/15 Expiration Date:  01/04/16     Start Date:  07/08/15 End Date:  --     Ordering Provider:  -- Authorizing Provider:  Lucille Passy, MD Ordering User:  Lucille Passy, MD                    Original Order:  ALPRAZolam Duanne Moron) 0.25 MG tablet [[id number]]        Pharmacy:  [facility] [place], [place] - [id number] [place] Comments:  --         Quantity Remaining:  0 tablet Quantity Filled:  0 tablet              Order Class     Print     All Administrations of ALPRAZolam (XANAX) 0.25 MG tablet     No Administrations Recorded              Med Administrations and Associated Flowsheet Values (last 96 hours)     None     Warnings History     Total number of overridden warnings: 2    Full Warnings History      Order History  Outpatient    Date/Time Action Taken User Additional Information    07/08/15 1414 Sign Lucille Passy, MD ReorderfromOrder:[id number]    07/08/15 1414 Taking Flag Checked Talia Nicki Reaper, MD       Medication Detail       Disp Refills Start End      ALPRAZolam (XANAX) 0.25 MG tablet 90 tablet 0 07/08/2015      Sig: TAKE ONE TABLET BY MOUTH THREE TIMES DAILY AS NEEDED FOR ANXIETY     Class: Print

## 2015-07-15 ENCOUNTER — Ambulatory Visit: Payer: PPO | Admitting: Internal Medicine

## 2015-07-19 ENCOUNTER — Encounter (HOSPITAL_COMMUNITY): Payer: Self-pay | Admitting: *Deleted

## 2015-07-19 ENCOUNTER — Inpatient Hospital Stay (HOSPITAL_COMMUNITY)
Admission: EM | Admit: 2015-07-19 | Discharge: 2015-07-25 | DRG: 190 | Disposition: A | Discharge status: Home Health | Payer: PPO | Attending: Internal Medicine | Admitting: Internal Medicine

## 2015-07-19 ENCOUNTER — Emergency Department (HOSPITAL_COMMUNITY): Payer: PPO

## 2015-07-19 DIAGNOSIS — M810 Age-related osteoporosis without current pathological fracture: Secondary | ICD-10-CM | POA: Diagnosis present

## 2015-07-19 DIAGNOSIS — Z86010 Personal history of colonic polyps: Secondary | ICD-10-CM | POA: Diagnosis not present

## 2015-07-19 DIAGNOSIS — Z720 Tobacco use: Secondary | ICD-10-CM | POA: Diagnosis not present

## 2015-07-19 DIAGNOSIS — F17210 Nicotine dependence, cigarettes, uncomplicated: Secondary | ICD-10-CM | POA: Diagnosis not present

## 2015-07-19 DIAGNOSIS — Z841 Family history of disorders of kidney and ureter: Secondary | ICD-10-CM | POA: Diagnosis not present

## 2015-07-19 DIAGNOSIS — R05 Cough: Secondary | ICD-10-CM | POA: Diagnosis not present

## 2015-07-19 DIAGNOSIS — Z7951 Long term (current) use of inhaled steroids: Secondary | ICD-10-CM | POA: Diagnosis not present

## 2015-07-19 DIAGNOSIS — Z8572 Personal history of non-Hodgkin lymphomas: Secondary | ICD-10-CM | POA: Diagnosis not present

## 2015-07-19 DIAGNOSIS — Z7982 Long term (current) use of aspirin: Secondary | ICD-10-CM | POA: Diagnosis not present

## 2015-07-19 DIAGNOSIS — Z809 Family history of malignant neoplasm, unspecified: Secondary | ICD-10-CM | POA: Diagnosis not present

## 2015-07-19 DIAGNOSIS — K5900 Constipation, unspecified: Secondary | ICD-10-CM | POA: Diagnosis present

## 2015-07-19 DIAGNOSIS — Z515 Encounter for palliative care: Secondary | ICD-10-CM | POA: Insufficient documentation

## 2015-07-19 DIAGNOSIS — IMO0001 Reserved for inherently not codable concepts without codable children: Secondary | ICD-10-CM

## 2015-07-19 DIAGNOSIS — Z85118 Personal history of other malignant neoplasm of bronchus and lung: Secondary | ICD-10-CM | POA: Diagnosis not present

## 2015-07-19 DIAGNOSIS — J441 Chronic obstructive pulmonary disease with (acute) exacerbation: Secondary | ICD-10-CM | POA: Diagnosis not present

## 2015-07-19 DIAGNOSIS — R627 Adult failure to thrive: Secondary | ICD-10-CM | POA: Diagnosis present

## 2015-07-19 DIAGNOSIS — Z794 Long term (current) use of insulin: Secondary | ICD-10-CM | POA: Diagnosis not present

## 2015-07-19 DIAGNOSIS — Z87440 Personal history of urinary (tract) infections: Secondary | ICD-10-CM

## 2015-07-19 DIAGNOSIS — F418 Other specified anxiety disorders: Secondary | ICD-10-CM | POA: Diagnosis present

## 2015-07-19 DIAGNOSIS — Z87442 Personal history of urinary calculi: Secondary | ICD-10-CM | POA: Diagnosis not present

## 2015-07-19 DIAGNOSIS — Z79899 Other long term (current) drug therapy: Secondary | ICD-10-CM

## 2015-07-19 DIAGNOSIS — I10 Essential (primary) hypertension: Secondary | ICD-10-CM | POA: Diagnosis present

## 2015-07-19 DIAGNOSIS — J9620 Acute and chronic respiratory failure, unspecified whether with hypoxia or hypercapnia: Secondary | ICD-10-CM | POA: Diagnosis present

## 2015-07-19 DIAGNOSIS — Z7189 Other specified counseling: Secondary | ICD-10-CM | POA: Insufficient documentation

## 2015-07-19 DIAGNOSIS — E119 Type 2 diabetes mellitus without complications: Secondary | ICD-10-CM | POA: Diagnosis not present

## 2015-07-19 DIAGNOSIS — K760 Fatty (change of) liver, not elsewhere classified: Secondary | ICD-10-CM | POA: Diagnosis present

## 2015-07-19 DIAGNOSIS — Z823 Family history of stroke: Secondary | ICD-10-CM | POA: Diagnosis not present

## 2015-07-19 DIAGNOSIS — T380X5A Adverse effect of glucocorticoids and synthetic analogues, initial encounter: Secondary | ICD-10-CM | POA: Diagnosis present

## 2015-07-19 DIAGNOSIS — Z8249 Family history of ischemic heart disease and other diseases of the circulatory system: Secondary | ICD-10-CM | POA: Diagnosis not present

## 2015-07-19 DIAGNOSIS — R251 Tremor, unspecified: Secondary | ICD-10-CM

## 2015-07-19 DIAGNOSIS — E785 Hyperlipidemia, unspecified: Secondary | ICD-10-CM | POA: Diagnosis present

## 2015-07-19 DIAGNOSIS — Z820 Family history of epilepsy and other diseases of the nervous system: Secondary | ICD-10-CM

## 2015-07-19 DIAGNOSIS — K219 Gastro-esophageal reflux disease without esophagitis: Secondary | ICD-10-CM | POA: Diagnosis present

## 2015-07-19 DIAGNOSIS — R0600 Dyspnea, unspecified: Secondary | ICD-10-CM | POA: Diagnosis not present

## 2015-07-19 DIAGNOSIS — R059 Cough, unspecified: Secondary | ICD-10-CM

## 2015-07-19 DIAGNOSIS — Z923 Personal history of irradiation: Secondary | ICD-10-CM

## 2015-07-19 DIAGNOSIS — R0602 Shortness of breath: Secondary | ICD-10-CM | POA: Diagnosis not present

## 2015-07-19 LAB — BASIC METABOLIC PANEL
ANION GAP: 9 (ref 5–15)
BUN: 14 mg/dL (ref 6–20)
CO2: 25 mmol/L (ref 22–32)
Calcium: 9.1 mg/dL (ref 8.9–10.3)
Chloride: 100 mmol/L — ABNORMAL LOW (ref 101–111)
Creatinine, Ser: 0.8 mg/dL (ref 0.44–1.00)
Glucose, Bld: 170 mg/dL — ABNORMAL HIGH (ref 65–99)
POTASSIUM: 4 mmol/L (ref 3.5–5.1)
SODIUM: 134 mmol/L — AB (ref 135–145)

## 2015-07-19 LAB — CBC WITH DIFFERENTIAL/PLATELET
BASOS ABS: 0 10*3/uL (ref 0.0–0.1)
Basophils Relative: 0 %
Eosinophils Absolute: 0.1 10*3/uL (ref 0.0–0.7)
Eosinophils Relative: 1 %
HEMATOCRIT: 43.7 % (ref 36.0–46.0)
HEMOGLOBIN: 14.9 g/dL (ref 12.0–15.0)
LYMPHS PCT: 19 %
Lymphs Abs: 1.9 10*3/uL (ref 0.7–4.0)
MCH: 31.2 pg (ref 26.0–34.0)
MCHC: 34.1 g/dL (ref 30.0–36.0)
MCV: 91.4 fL (ref 78.0–100.0)
MONO ABS: 0.7 10*3/uL (ref 0.1–1.0)
MONOS PCT: 7 %
NEUTROS ABS: 7.3 10*3/uL (ref 1.7–7.7)
Neutrophils Relative %: 73 %
Platelets: 235 10*3/uL (ref 150–400)
RBC: 4.78 MIL/uL (ref 3.87–5.11)
RDW: 13 % (ref 11.5–15.5)
WBC: 10.1 10*3/uL (ref 4.0–10.5)

## 2015-07-19 LAB — TROPONIN I: TROPONIN I: 0.03 ng/mL (ref <0.031)

## 2015-07-19 LAB — LACTIC ACID, PLASMA: LACTIC ACID, VENOUS: 2.3 mmol/L — AB (ref 0.5–2.0)

## 2015-07-19 LAB — BRAIN NATRIURETIC PEPTIDE: B NATRIURETIC PEPTIDE 5: 129 pg/mL — AB (ref 0.0–100.0)

## 2015-07-19 MED ORDER — GLIPIZIDE ER 5 MG PO TB24
5.0000 mg | ORAL_TABLET | Freq: Two times a day (BID) | ORAL | Status: DC
  Administered 2015-07-19 – 2015-07-25 (×12): 5 mg via ORAL
  Filled 2015-07-19 (×12): qty 1

## 2015-07-19 MED ORDER — LORAZEPAM 2 MG/ML IJ SOLN
0.5000 mg | Freq: Once | INTRAMUSCULAR | Status: AC
  Administered 2015-07-19: 0.5 mg via INTRAVENOUS
  Filled 2015-07-19: qty 1

## 2015-07-19 MED ORDER — LEVOFLOXACIN IN D5W 750 MG/150ML IV SOLN
750.0000 mg | INTRAVENOUS | Status: DC
  Administered 2015-07-19 – 2015-07-22 (×4): 750 mg via INTRAVENOUS
  Filled 2015-07-19 (×4): qty 150

## 2015-07-19 MED ORDER — IPRATROPIUM-ALBUTEROL 0.5-2.5 (3) MG/3ML IN SOLN: 3.0000 mL | Freq: Once | RESPIRATORY_TRACT | Status: DC

## 2015-07-19 MED ORDER — ONDANSETRON HCL 4 MG PO TABS: 4.0000 mg | ORAL_TABLET | Freq: Four times a day (QID) | ORAL | Status: DC | PRN

## 2015-07-19 MED ORDER — ENOXAPARIN SODIUM 40 MG/0.4ML ~~LOC~~ SOLN
40.0000 mg | SUBCUTANEOUS | Status: DC
Start: 2015-07-19 — End: 2015-07-25
  Administered 2015-07-19 – 2015-07-24 (×8): 40 mg via SUBCUTANEOUS
  Filled 2015-07-19 (×6): qty 0.4

## 2015-07-19 MED ORDER — MORPHINE SULFATE (PF) 2 MG/ML IV SOLN
2.0000 mg | INTRAVENOUS | Status: DC | PRN
  Administered 2015-07-20 – 2015-07-23 (×16): 2 mg via INTRAVENOUS
  Filled 2015-07-19 (×15): qty 1

## 2015-07-19 MED ORDER — ASPIRIN EC 81 MG PO TBEC
81.0000 mg | DELAYED_RELEASE_TABLET | Freq: Every morning | ORAL | Status: DC
  Administered 2015-07-20 – 2015-07-25 (×6): 81 mg via ORAL
  Filled 2015-07-19 (×6): qty 1

## 2015-07-19 MED ORDER — POLYETHYLENE GLYCOL 3350 17 G PO PACK
17.0000 g | PACK | Freq: Every day | ORAL | Status: DC | PRN
  Administered 2015-07-24: 17 g via ORAL
  Filled 2015-07-19: qty 1

## 2015-07-19 MED ORDER — IPRATROPIUM-ALBUTEROL 0.5-2.5 (3) MG/3ML IN SOLN
3.0000 mL | Freq: Four times a day (QID) | RESPIRATORY_TRACT | Status: DC
  Administered 2015-07-20 – 2015-07-25 (×21): 3 mL via RESPIRATORY_TRACT
  Filled 2015-07-19 (×21): qty 3

## 2015-07-19 MED ORDER — LOSARTAN POTASSIUM 50 MG PO TABS
50.0000 mg | ORAL_TABLET | Freq: Every day | ORAL | Status: DC
  Administered 2015-07-20 – 2015-07-25 (×6): 50 mg via ORAL
  Filled 2015-07-19 (×6): qty 1

## 2015-07-19 MED ORDER — ONDANSETRON HCL 4 MG/2ML IJ SOLN
4.0000 mg | Freq: Four times a day (QID) | INTRAMUSCULAR | Status: DC | PRN
  Administered 2015-07-20: 4 mg via INTRAVENOUS
  Filled 2015-07-19: qty 2

## 2015-07-19 MED ORDER — ALPRAZOLAM 0.25 MG PO TABS
0.2500 mg | ORAL_TABLET | Freq: Three times a day (TID) | ORAL | Status: DC | PRN
  Administered 2015-07-20 – 2015-07-23 (×8): 0.25 mg via ORAL
  Filled 2015-07-19 (×8): qty 1

## 2015-07-19 MED ORDER — MORPHINE SULFATE (PF) 2 MG/ML IV SOLN
2.0000 mg | INTRAVENOUS | Status: DC | PRN
  Administered 2015-07-19: 2 mg via INTRAVENOUS
  Filled 2015-07-19 (×2): qty 1

## 2015-07-19 MED ORDER — ALBUTEROL SULFATE (2.5 MG/3ML) 0.083% IN NEBU: 2.5000 mg | INHALATION_SOLUTION | RESPIRATORY_TRACT | Status: DC | PRN

## 2015-07-19 MED ORDER — IPRATROPIUM BROMIDE 0.02 % IN SOLN
1.0000 mg | Freq: Once | RESPIRATORY_TRACT | Status: AC
  Administered 2015-07-19: 1 mg via RESPIRATORY_TRACT
  Filled 2015-07-19: qty 5

## 2015-07-19 MED ORDER — ALBUTEROL (5 MG/ML) CONTINUOUS INHALATION SOLN
10.0000 mg/h | INHALATION_SOLUTION | Freq: Once | RESPIRATORY_TRACT | Status: AC
  Administered 2015-07-19: 10 mg/h via RESPIRATORY_TRACT
  Filled 2015-07-19: qty 20

## 2015-07-19 MED ORDER — CITALOPRAM HYDROBROMIDE 20 MG PO TABS
40.0000 mg | ORAL_TABLET | Freq: Every day | ORAL | Status: DC
  Administered 2015-07-20 – 2015-07-21 (×2): 40 mg via ORAL
  Filled 2015-07-19 (×2): qty 2

## 2015-07-19 MED ORDER — SODIUM CHLORIDE 0.9 % IV SOLN
INTRAVENOUS | Status: DC
  Administered 2015-07-19 – 2015-07-21 (×4): via INTRAVENOUS

## 2015-07-19 MED ORDER — INSULIN ASPART 100 UNIT/ML ~~LOC~~ SOLN
0.0000 [IU] | Freq: Three times a day (TID) | SUBCUTANEOUS | Status: DC
  Administered 2015-07-20: 15 [IU] via SUBCUTANEOUS

## 2015-07-19 MED ORDER — SODIUM CHLORIDE 0.9 % IV SOLN: INTRAVENOUS | Status: AC

## 2015-07-19 MED ORDER — MORPHINE SULFATE (PF) 2 MG/ML IV SOLN
1.0000 mg | INTRAVENOUS | Status: DC | PRN
Start: 2015-07-19 — End: 2015-07-19
  Administered 2015-07-19: 1 mg via INTRAVENOUS
  Filled 2015-07-19 (×2): qty 1

## 2015-07-19 MED ORDER — METOPROLOL SUCCINATE ER 50 MG PO TB24
50.0000 mg | ORAL_TABLET | Freq: Every day | ORAL | Status: DC
  Administered 2015-07-20 – 2015-07-25 (×6): 50 mg via ORAL
  Filled 2015-07-19 (×6): qty 1

## 2015-07-19 MED ORDER — DOCUSATE SODIUM 100 MG PO CAPS
100.0000 mg | ORAL_CAPSULE | Freq: Two times a day (BID) | ORAL | Status: DC
  Administered 2015-07-19 – 2015-07-24 (×10): 100 mg via ORAL
  Filled 2015-07-19 (×10): qty 1

## 2015-07-19 MED ORDER — BISACODYL 10 MG RE SUPP
10.0000 mg | Freq: Every day | RECTAL | Status: DC | PRN
  Administered 2015-07-20 – 2015-07-21 (×2): 10 mg via RECTAL
  Filled 2015-07-19 (×2): qty 1

## 2015-07-19 MED ORDER — METHYLPREDNISOLONE SODIUM SUCC 125 MG IJ SOLR
125.0000 mg | Freq: Once | INTRAMUSCULAR | Status: AC
  Administered 2015-07-19: 125 mg via INTRAVENOUS
  Filled 2015-07-19: qty 2

## 2015-07-19 MED ORDER — ALBUTEROL SULFATE (2.5 MG/3ML) 0.083% IN NEBU: 2.5000 mg | INHALATION_SOLUTION | RESPIRATORY_TRACT | Status: DC

## 2015-07-19 MED ORDER — METHYLPREDNISOLONE SODIUM SUCC 125 MG IJ SOLR
80.0000 mg | Freq: Two times a day (BID) | INTRAMUSCULAR | Status: DC
  Administered 2015-07-19 – 2015-07-22 (×6): 80 mg via INTRAVENOUS
  Filled 2015-07-19 (×6): qty 2

## 2015-07-19 MED ORDER — SODIUM CHLORIDE 0.9% FLUSH
3.0000 mL | Freq: Two times a day (BID) | INTRAVENOUS | Status: DC
  Administered 2015-07-21 – 2015-07-25 (×6): 3 mL via INTRAVENOUS

## 2015-07-19 MED ORDER — ACETAMINOPHEN 325 MG PO TABS
650.0000 mg | ORAL_TABLET | Freq: Four times a day (QID) | ORAL | Status: DC | PRN
  Administered 2015-07-19 – 2015-07-25 (×3): 650 mg via ORAL
  Filled 2015-07-19 (×3): qty 2

## 2015-07-19 MED ORDER — INSULIN DETEMIR 100 UNIT/ML ~~LOC~~ SOLN
SUBCUTANEOUS | Status: AC
  Filled 2015-07-19: qty 1

## 2015-07-19 MED ORDER — INSULIN DETEMIR 100 UNIT/ML ~~LOC~~ SOLN
16.0000 [IU] | Freq: Every day | SUBCUTANEOUS | Status: DC
  Administered 2015-07-20: 16 [IU] via SUBCUTANEOUS
  Filled 2015-07-19: qty 0.16

## 2015-07-19 MED ORDER — ACETAMINOPHEN 650 MG RE SUPP: 650.0000 mg | Freq: Four times a day (QID) | RECTAL | Status: DC | PRN

## 2015-07-19 MED ORDER — INSULIN ASPART 100 UNIT/ML ~~LOC~~ SOLN: 0.0000 [IU] | Freq: Every day | SUBCUTANEOUS | Status: DC
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
## 2015-07-19 NOTE — ED Notes (Signed)
hospitalist in to assess and speak with caregivers

## 2015-07-19 NOTE — ED Notes (Addendum)
Pt c/o cough with yellow and green sputum production that started a few days ago, sob that started getting worse yesterday, pt having difficulty speaking in full sentences,  Pt also c/o bilateral ear fullness and headache,

## 2015-07-19 NOTE — ED Provider Notes (Signed)
CSN: [id number]     Arrival date & time 07/19/15  1945 History   First MD Initiated Contact with Patient 07/19/15 1954     Chief Complaint  Patient presents with  . Shortness of Breath      HPI Pt was seen at 2000.  Per pt, c/o gradual onset and worsening of persistent cough, wheezing and SOB for the past 5 days, worse over the past 2 days.  Describes her symptoms as "my COPD is acting up."  Also endorses hx of CHF. Has been using home MDI without relief. Pt continues to smoke cigarettes. Denies CP/palpitations, no back pain, no abd pain, no N/V/D, no fevers, no rash.    Past Medical History  Diagnosis Date  . Benign tumor of breast 2004    left  . Broken ribs     history of several broken ribs on right and compression  fx  . Fibroma of skin     Multiple fibromas/lipomas on arms and legs B  . Anxiety   . COPD (chronic obstructive pulmonary disease) ([place])   . Hyperlipidemia   . History of CT scan of abdomen 05/23/2003    Mild fatty liver inf, tiny hypodensity in right lobe of liver  . Pulmonary nodules   . Diabetes mellitus without complication ([place])   . Hx of radiation therapy 04/11/12- 05/08/12    left chest wall area, 40 gray 20 fx  . Hypertension   . Diverticulosis   . Adenomatous colon polyp   . Cancer ([place])     non-hodgkins b cell lymphoma  . Lung cancer ([place])     pulmonary lymphoid neoplasm questionable for non-Hodgkins lymphoma  . Depression   . Pyelonephritis   . Kidney stone 02/21/1981    Right/removed  . GERD (gastroesophageal reflux disease)   . Osteoarthritis     hands, degenerative back , osteoporosis   . Cold virus     seen by Dr. Deborra Medina on 10/12, given Rx for antibiotic & steroid  . CHF (congestive heart failure) ([place])     hosp. for CHF- 10/2013  . Shortness of breath     only with movemnent   . C. difficile colitis 06/21/2014  . UTI (lower urinary tract infection)   . Palpitations   . Tremor   . Weight loss     "multifactorial" per PMD note  . Chronic  abdominal pain   . Gait instability    Past Surgical History  Procedure Laterality Date  . Appendectomy  02/21/1961  . Exploratory laparotomy  02/21/1970    for ? adhesions  . Abdominal hysterectomy  02/21/1970    right ovary remaining  . Bone marrow biopsy  03/09/2012  . Lung core biopsy  02/06/2012  . Tonsillectomy    . Breast surgery  1970's    lump removed- benign   . Tubal ligation    . Eye surgery      bilateral cataracts removed - /W- IOL  . Cholecystectomy N/A 12/11/2013    Procedure: LAPAROSCOPIC CHOLECYSTECTOMY WITH INTRAOPERATIVE CHOLANGIOGRAM;  Surgeon: Erroll Luna, MD;  Location: [facility];  Service: General;  Laterality: N/A;  . Mass excision Right 04/21/2015    Procedure: EXCISION RIGHT THIGH  MASS;  Surgeon: Erroll Luna, MD;  Location: [facility];  Service: General;  Laterality: Right;   Family History  Problem Relation Age of Onset  . Parkinsonism Brother   . Stroke Father   . Cancer Sister   . Heart disease Brother   .  Clotting disorder Father   . Kidney disease Mother    Social History  Substance Use Topics  . Smoking status: Current Every Day Smoker -- 0.25 packs/day for 30 years    Types: Cigarettes  . Smokeless tobacco: Never Used     Comment: trying to quit smokes about 2-3 cigs per day, e-cigs with no nicotine  . Alcohol Use: No    Review of Systems ROS: Statement: All systems negative except as marked or noted in the HPI; Constitutional: Negative for fever and chills. ; ; Eyes: Negative for eye pain, redness and discharge. ; ; ENMT: Negative for ear pain, hoarseness, nasal congestion, sinus pressure and sore throat. ; ; Cardiovascular: Negative for chest pain, palpitations, diaphoresis, and peripheral edema. ; ; Respiratory: +cough, wheezing, SOB. Negative for stridor. ; ; Gastrointestinal: Negative for nausea, vomiting, diarrhea, abdominal pain, blood in stool, hematemesis, jaundice and rectal bleeding. . ; ; Genitourinary: Negative  for dysuria, flank pain and hematuria. ; ; Musculoskeletal: Negative for back pain and neck pain. Negative for swelling and trauma.; ; Skin: Negative for pruritus, rash, abrasions, blisters, bruising and skin lesion.; ; Neuro: Negative for headache, lightheadedness and neck stiffness. Negative for weakness, altered level of consciousness, altered mental status, extremity weakness, paresthesias, involuntary movement, seizure and syncope.      Allergies  Sulfonamide derivatives and Metformin and related  Home Medications   Prior to Admission medications   Medication Sig Start Date End Date Taking? Authorizing Provider  albuterol (PROVENTIL HFA;VENTOLIN HFA) 108 (90 BASE) MCG/ACT inhaler Inhale 2 puffs into the lungs every 6 (six) hours as needed for wheezing or shortness of breath. 12/25/14   Erline Hau, MD  ALPRAZolam Duanne Moron) 0.25 MG tablet TAKE ONE TABLET BY MOUTH THREE TIMES DAILY AS NEEDED FOR ANXIETY 07/08/15   Lucille Passy, MD  ALPRAZolam Duanne Moron) 0.5 MG tablet TAKE ONE TABLET BY MOUTH THREE TIMES DAILY AS NEEDED FOR ANXIETY 03/30/15   Lucille Passy, MD  aspirin EC 81 MG tablet Take 81 mg by mouth every morning.    Historical Provider, MD  Blood Glucose Monitoring Suppl (ONE TOUCH BASIC SYSTEM) W/DEVICE KIT Use as directed to test blood sugar three times daily Dx:R73.09 05/01/14   Lucille Passy, MD  budesonide-formoterol [facility]) 160-4.5 MCG/ACT inhaler Inhale 2 puffs into the lungs 2 (two) times daily. 12/25/14   Erline Hau, MD  citalopram (CELEXA) 40 MG tablet Take 1 tablet (40 mg total) by mouth daily. 03/05/15   Lucille Passy, MD  HYDROcodone-acetaminophen (NORCO) 5-325 MG tablet Take 1 tablet by mouth every 6 (six) hours as needed for moderate pain. 04/21/15   Erroll Luna, MD  insulin aspart (NOVOLOG) 100 UNIT/ML injection Inject 6 Units into the skin 3 (three) times daily before meals. 04/13/15   Philemon Kingdom, MD  Insulin Detemir (LEVEMIR FLEXTOUCH) 100  UNIT/ML Pen Inject 16 Units into the skin daily at 10 pm. 04/15/15   Philemon Kingdom, MD  Insulin Pen Needle 32G X 4 MM MISC Use to inject 4x daily. Relion Brand 02/10/15   Philemon Kingdom, MD  Insulin Syringe-Needle U-100 (INSULIN SYRINGE .3CC/31GX5/16") 31G X 5/16" 0.3 ML MISC Use to inject insulin 3 times daily as instructed. 04/07/15   Philemon Kingdom, MD  losartan (COZAAR) 50 MG tablet TAKE ONE-HALF TABLET BY MOUTH ONCE DAILY FOR 4 DAYS ,  THEN  INCREASE  DOSE  TO  ONE  WHOLE  TABLET  DAILY  AS  PREVIOUSLY  PRESCRIBED. 03/05/15  Lucille Passy, MD  metoprolol succinate (TOPROL-XL) 50 MG 24 hr tablet TAKE ONE TABLET BY MOUTH ONCE DAILY-- TAKE WITH, OR IMMEDIATELY FOLLOWING A MEAL 02/10/15   Lucille Passy, MD  ONE [facility] LANCETS MISC Use as directed to test blood sugar three times daily Dx:R73.09 01/01/15   Lucille Passy, MD  ONE TOUCH ULTRA TEST test strip USE ONE STRIP TO CHECK GLUCOSE THREE TIMES DAILY 03/03/15   Philemon Kingdom, MD  vitamin B-12 (CYANOCOBALAMIN) 500 MCG tablet Take 500 mcg by mouth daily.    Historical Provider, MD   BP 127/66 mmHg  Pulse 140  Temp(Src) 99.9 F (37.7 C) (Oral)  Resp 30  SpO2 97% Physical Exam  2005: Physical examination:  Nursing notes reviewed; Vital signs and O2 SAT reviewed;  Constitutional: Well developed, Well nourished, Well hydrated, Uncomfortable appearing; Head:  Normocephalic, atraumatic; Eyes: EOMI, PERRL, No scleral icterus; ENMT: Mouth and pharynx normal, Mucous membranes moist; Neck: Supple, Full range of motion, No lymphadenopathy; Cardiovascular: Tachycardic rate and rhythm, No gallop; Respiratory: Breath sounds diminished & equal bilaterally, faint scattered wheezes. No audible wheezing. Speaking short sentences, tachypneic, sitting upright. +frequent moist cough during exam.; Chest: Nontender, Movement normal; Abdomen: Soft, Nontender, Nondistended, Normal bowel sounds; Genitourinary: No CVA tenderness; Extremities: Pulses normal, No tenderness,  No edema, No calf edema or asymmetry.; Neuro: AA&Ox3, Major CN grossly intact.  Speech clear. No gross focal motor or sensory deficits in extremities.; Skin: Color normal, Warm, Dry.; Psych:  Anxious, tremorous.   ED Course  Procedures (including critical care time) Labs Review   Imaging Review  I have personally reviewed and evaluated these images and lab results as part of my medical decision-making.   EKG Interpretation   Date/Time:  Sunday Jul 19 2015 20:00:11 EDT Ventricular Rate:  93 PR Interval:  120 QRS Duration: 73 QT Interval:  346 QTC Calculation: 430 R Axis:   72 Text Interpretation:  Sinus tachycardia Atrial premature complexes  Anteroseptal infarct, age indeterminate Baseline wander Artifact When  compared with ECG of 12/24/2014 Rate faster Confirmed by [facility]  MD,  Nunzio Cory [phone]) on 07/19/2015 8:04:28 PM      EKG Interpretation  Date/Time:  Sunday Jul 19 2015 21:17:39 EDT Ventricular Rate:  145 PR Interval:  87 QRS Duration: 98 QT Interval:  343 QTC Calculation: 533 R Axis:   57 Text Interpretation:  Sinus tachycardia Paired ventricular premature complexes Borderline low voltage, extremity leads Probable anteroseptal infarct, old Nonspecific ST and T wave abnormality Baseline wander Artifact Since last tracing rate faster Confirmed by [facility]  MD, Nunzio Cory [phone]) on 07/19/2015 9:47:35 PM        MDM  MDM Reviewed: previous chart, nursing note and vitals Reviewed previous: labs and ECG Interpretation: labs, ECG and x-ray Total time providing critical care: 30-74 minutes. This excludes time spent performing separately reportable procedures and services. Consults: admitting MD      CRITICAL CARE Performed by: Alfonzo Feller Total critical care time: 35 minutes Critical care time was exclusive of separately billable procedures and treating other patients. Critical care was necessary to treat or prevent imminent or life-threatening  deterioration. Critical care was time spent personally by me on the following activities: development of treatment plan with patient and/or surrogate as well as nursing, discussions with consultants, evaluation of patient's response to treatment, examination of patient, obtaining history from patient or surrogate, ordering and performing treatments and interventions, ordering and review of laboratory studies, ordering and review of radiographic studies, pulse oximetry and  re-evaluation of patient's condition.   Results for orders placed or performed during the hospital encounter of 22/29/79  Basic metabolic panel  Result Value Ref Range   Sodium 134 (L) 135 - 145 mmol/L   Potassium 4.0 3.5 - 5.1 mmol/L   Chloride 100 (L) 101 - 111 mmol/L   CO2 25 22 - 32 mmol/L   Glucose, Bld 170 (H) 65 - 99 mg/dL   BUN 14 6 - 20 mg/dL   Creatinine, Ser 0.80 0.44 - 1.00 mg/dL   Calcium 9.1 8.9 - 10.3 mg/dL   GFR calc non Af Amer >60 >60 mL/min   GFR calc Af Amer >60 >60 mL/min   Anion gap 9 5 - 15  Brain natriuretic peptide  Result Value Ref Range   B Natriuretic Peptide 129.0 (H) 0.0 - 100.0 pg/mL  Troponin I  Result Value Ref Range   Troponin I 0.03 <0.031 ng/mL  CBC with Differential  Result Value Ref Range   WBC 10.1 4.0 - 10.5 K/uL   RBC 4.78 3.87 - 5.11 MIL/uL   Hemoglobin 14.9 12.0 - 15.0 g/dL   HCT 43.7 36.0 - 46.0 %   MCV 91.4 78.0 - 100.0 fL   MCH 31.2 26.0 - 34.0 pg   MCHC 34.1 30.0 - 36.0 g/dL   RDW 13.0 11.5 - 15.5 %   Platelets 235 150 - 400 K/uL   Neutrophils Relative % 73 %   Neutro Abs 7.3 1.7 - 7.7 K/uL   Lymphocytes Relative 19 %   Lymphs Abs 1.9 0.7 - 4.0 K/uL   Monocytes Relative 7 %   Monocytes Absolute 0.7 0.1 - 1.0 K/uL   Eosinophils Relative 1 %   Eosinophils Absolute 0.1 0.0 - 0.7 K/uL   Basophils Relative 0 %   Basophils Absolute 0.0 0.0 - 0.1 K/uL   Dg Chest Port 1 View 07/19/2015  CLINICAL DATA:  Acute onset of shortness of breath and productive cough.  Bilateral ear fullness and headache. Initial encounter. EXAM: PORTABLE CHEST 1 VIEW COMPARISON:  Chest radiograph performed 12/24/2014, and CT of the chest performed 04/28/2015 FINDINGS: The lungs are well-aerated. Vascular congestion is noted. Increased interstitial markings raise concern for mild interstitial edema. There is no evidence of pleural effusion or pneumothorax. The cardiomediastinal silhouette is within normal limits. No acute osseous abnormalities are seen. IMPRESSION: Vascular congestion noted. Increased interstitial markings raise concern for mild interstitial edema. Electronically Signed   By: Garald Balding M.D.   On: 07/19/2015 20:20    2140:  On arrival: pt sitting upright, tachypneic, tachycardic, Sats 94 % R/A, lungs diminished. IV solumedrol and hour long neb started. After neb: pt appears more comfortable at rest, less tachypneic, Sats 97 % on O2 2L N/C, lungs continue diminished. Pt tremulous after hour long neb and tachycardic; requesting a dose of her anxiety medication. Also requesting pain meds for "a headache I got from all the coughing." Pt medicated per request. Dx and testing d/w pt and family.  Questions answered.  Verb understanding, agreeable to admit.   2205:  T/C to [facility] Dr. Jonnie Finner, case discussed, including:  HPI, pertinent PM/SHx, VS/PE, dx testing, ED course and treatment:  Agreeable to admit, requests to write temporary orders, obtain tele bed to team APAdmits.   Francine Graven, DO 07/20/15 2024

## 2015-07-19 NOTE — ED Notes (Signed)
EKG with update to Dr Leanor Rubenstein

## 2015-07-19 NOTE — ED Notes (Signed)
Pt complains of Headache and ear pain from coughing. She states she feels her head is splitting in two when she coughs. She reports that when she goes out around people she feels that she gets sick. She continues to smoke. She does not live alone and her caregiver is with her in the ED

## 2015-07-19 NOTE — ED Notes (Signed)
CRITICAL VALUE ALERT  Critical value received: lactic acid 2.3  Date of notification:  07/19/2015  Time of notification:  2142  Critical value read back:Yes.    Nurse who received alert:  Fabio Neighbors RN  MD notified (1st page):  Dr. Thurnell Garbe  Time of first page:  2142

## 2015-07-19 NOTE — ED Notes (Signed)
Call to lab re: lactic acid draw

## 2015-07-19 NOTE — ED Notes (Signed)
Lab in to draw lactic acid

## 2015-07-20 ENCOUNTER — Encounter (HOSPITAL_COMMUNITY): Payer: Self-pay | Admitting: Primary Care

## 2015-07-20 DIAGNOSIS — E119 Type 2 diabetes mellitus without complications: Secondary | ICD-10-CM | POA: Diagnosis not present

## 2015-07-20 DIAGNOSIS — Z7189 Other specified counseling: Secondary | ICD-10-CM | POA: Diagnosis not present

## 2015-07-20 DIAGNOSIS — R627 Adult failure to thrive: Secondary | ICD-10-CM | POA: Diagnosis not present

## 2015-07-20 DIAGNOSIS — Z515 Encounter for palliative care: Secondary | ICD-10-CM | POA: Diagnosis not present

## 2015-07-20 DIAGNOSIS — F418 Other specified anxiety disorders: Secondary | ICD-10-CM | POA: Diagnosis not present

## 2015-07-20 DIAGNOSIS — R0600 Dyspnea, unspecified: Secondary | ICD-10-CM | POA: Diagnosis not present

## 2015-07-20 DIAGNOSIS — J441 Chronic obstructive pulmonary disease with (acute) exacerbation: Secondary | ICD-10-CM | POA: Diagnosis not present

## 2015-07-20 DIAGNOSIS — I10 Essential (primary) hypertension: Secondary | ICD-10-CM | POA: Diagnosis not present

## 2015-07-20 LAB — URINALYSIS, ROUTINE W REFLEX MICROSCOPIC
Bilirubin Urine: NEGATIVE
Glucose, UA: >1000 mg/dL — AB
Hgb urine dipstick: NEGATIVE
KETONES UR: NEGATIVE mg/dL
LEUKOCYTES UA: NEGATIVE
NITRITE: NEGATIVE
PH: 5.5 (ref 5.0–8.0)
PROTEIN: NEGATIVE mg/dL
Specific Gravity, Urine: 1.02 (ref 1.005–1.030)

## 2015-07-20 LAB — LACTIC ACID, PLASMA: Lactic Acid, Venous: 3 mmol/L (ref 0.5–2.0)

## 2015-07-20 LAB — GLUCOSE, CAPILLARY
GLUCOSE-CAPILLARY: 455 mg/dL — AB (ref 65–99)
GLUCOSE-CAPILLARY: 395 mg/dL — AB (ref 65–99)
GLUCOSE-CAPILLARY: 270 mg/dL — AB (ref 65–99)
GLUCOSE-CAPILLARY: 64 mg/dL — AB (ref 65–99)
GLUCOSE-CAPILLARY: 85 mg/dL (ref 65–99)
Glucose-Capillary: 425 mg/dL — ABNORMAL HIGH (ref 65–99)
Glucose-Capillary: 172 mg/dL — ABNORMAL HIGH (ref 65–99)

## 2015-07-20 LAB — URINE MICROSCOPIC-ADD ON

## 2015-07-20 MED ORDER — INSULIN DETEMIR 100 UNIT/ML ~~LOC~~ SOLN: 16.0000 [IU] | Freq: Every day | SUBCUTANEOUS | Status: DC

## 2015-07-20 MED ORDER — INSULIN ASPART 100 UNIT/ML ~~LOC~~ SOLN
0.0000 [IU] | Freq: Three times a day (TID) | SUBCUTANEOUS | Status: DC
  Administered 2015-07-20 – 2015-07-21 (×2): 7 [IU] via SUBCUTANEOUS
  Administered 2015-07-21: 4 [IU] via SUBCUTANEOUS
  Administered 2015-07-21: 3 [IU] via SUBCUTANEOUS
  Administered 2015-07-22: 4 [IU] via SUBCUTANEOUS
  Administered 2015-07-22 (×2): 7 [IU] via SUBCUTANEOUS
  Administered 2015-07-23 (×2): 4 [IU] via SUBCUTANEOUS
  Administered 2015-07-23 – 2015-07-24 (×2): 11 [IU] via SUBCUTANEOUS
  Administered 2015-07-24: 15 [IU] via SUBCUTANEOUS
  Administered 2015-07-24: 7 [IU] via SUBCUTANEOUS
  Administered 2015-07-25: 4 [IU] via SUBCUTANEOUS
  Administered 2015-07-25: 11 [IU] via SUBCUTANEOUS

## 2015-07-20 MED ORDER — INSULIN DETEMIR 100 UNIT/ML ~~LOC~~ SOLN
18.0000 [IU] | Freq: Every day | SUBCUTANEOUS | Status: DC
  Administered 2015-07-20 – 2015-07-25 (×6): 18 [IU] via SUBCUTANEOUS
  Filled 2015-07-20 (×8): qty 0.18

## 2015-07-20 MED ORDER — NICOTINE 14 MG/24HR TD PT24
14.0000 mg | MEDICATED_PATCH | Freq: Every day | TRANSDERMAL | Status: DC
  Administered 2015-07-20: 14 mg via TRANSDERMAL
  Filled 2015-07-20 (×4): qty 1

## 2015-07-20 MED ORDER — HYDROCOD POLST-CPM POLST ER 10-8 MG/5ML PO SUER
5.0000 mL | Freq: Once | ORAL | Status: AC
  Administered 2015-07-20: 5 mL via ORAL
  Filled 2015-07-20: qty 5

## 2015-07-20 MED ORDER — INSULIN ASPART 100 UNIT/ML ~~LOC~~ SOLN: 5.0000 [IU] | Freq: Three times a day (TID) | SUBCUTANEOUS | Status: DC

## 2015-07-20 NOTE — Progress Notes (Signed)
Pt's CBG 455, given 15 units of NOVOLOG per SSI orders.  Dr. Wyline Copas paged and made aware.

## 2015-07-20 NOTE — Progress Notes (Signed)
PROGRESS NOTE    Marie Holmes  WUJ:[id number] DOB: 02-Dec-1935 DOA: 07/19/2015 PCP: Arnette Norris, MD    Brief Narrative:  80 y.o. female with history of DM, HTN and COPD who presents to ED with several day hx of worsening SOB and cough. The cough is productive of greenish sputum and she has been having chills and sweats for several days too. Per daughter she is dyspneic "all the time", and can only move about the house minimally due to DOE at baseline. Today she found her looking worse so called EMS. Having chest pain ant chest w coughing only. No abd pain, n//vd, no dysuria or joint pain. No HA, sore throat or hemoptysis. In ED patient's HR 94 w RR 20, HR increased after neb Rx to 150 then now down to 122. CXR shows poss vasc congestion, but all her old AP projections show the same. ASked to see for admission   Assessment & Plan:   Principal Problem:   COPD with acute exacerbation ([place]) Active Problems:   Essential hypertension   Tobacco abuse   Tremor   Diabetes mellitus, insulin dependent (IDDM), controlled (HCC)   Dyspnea   FTT (failure to thrive) in adult   Depression with anxiety   Acute exacerbation of chronic obstructive pulmonary disease (COPD) ([place])   Palliative care encounter   Goals of care, counseling/discussion   1. Dyspnea/ acute resp failure/ acute exacerbation of COPD 1. Patient admitted to medical floor 2. Continue scheduled nebs, steroids, empiric antibiotics 3. Patient continues with decreased breath sounds with end expiratory wheezing. 4. Continue supplemental O2 as needed 2. DM2 on insulin  1. Glucose elevated this morning. Likely secondary to IV steroids 2. cont levemir 3. Have added 5 units of pre-meal aspart 4. SSI on resistant scale given concurrent steroid use. 3. HTN cont home meds 1. Blood pressure currently stable 2. Continue monitor 4. Tobacco use 1. Patient with active tobacco abuse. She is willing to try a nicotine patch will  here. 5. FTT  1. not doing well at home per family, very SOB 'all the time" 2. Palliative care consulted. Appreciate input. Patient wishes are for limited CODE STATUS: No intubation 6. Depression / anxiety - cont meds 7. Tremor - seems to be sporadic and at times nonexistent. Continue to monitor 8. Hist of Non-Hodgkins lymphoma 9. Concerns for possible urinary tract infection 1. Urinalysis noted to have many bacteria but otherwise no leukocytes, no nitrites, 0-5 WBCs. Doubt urinary tract infection. Instead, more suggestive of asymptomatic bacteriuria which is normally not treated   DVT prophylaxis: Lovenox Code Status: Limited code: No intubation Family Communication: Patient in room, family at bedside Disposition Plan: Uncertain at this time   Consultants:   Palliative care  Procedures:     Antimicrobials:  Anti-infectives    Start     Dose/Rate Route Frequency Ordered Stop   07/19/15 2330  levofloxacin (LEVAQUIN) IVPB 750 mg     750 mg 100 mL/hr over 90 Minutes Intravenous Every 24 hours 07/19/15 2316             Subjective: Still feels short of breath, little change from yesterday  Objective: Filed Vitals:   07/19/15 2329 07/20/15 0109 07/20/15 0652 07/20/15 0754  BP: 109/52  108/49   Pulse: 117  88   Temp: 98.4 F (36.9 C)  97.8 F (36.6 C)   TempSrc: Oral  Oral   Resp: 20  20   Height:   5' (1.524 m)   Weight:  58.469 kg (128 lb 14.4 oz)   SpO2: 98% 94% 93% 94%    Intake/Output Summary (Last 24 hours) at 07/20/15 1323 Last data filed at 07/20/15 4403  Gross per 24 hour  Intake      0 ml  Output    100 ml  Net   -100 ml   Filed Weights   07/20/15 4742  Weight: 58.469 kg (128 lb 14.4 oz)    Examination:  General exam: Appears calm and comfortable , Lying in bed Respiratory system: Decreased breath sounds, mildly increased respiratory effort with pursed lips, end expiratory wheezing Cardiovascular system: S1 & S2 heard, RRR. Gastrointestinal  system: Abdomen is nondistended, soft and nontender. No organomegaly or masses felt. Normal bowel sounds heard. Central nervous system: Alert and oriented. No focal neurological deficits. Extremities: Symmetric 5 x 5 power. Skin: No rashes, lesions Psychiatry: Judgement and insight appear normal. Mood & affect appropriate.     Data Reviewed: I have personally reviewed following labs and imaging studies  CBC:  Recent Labs Lab 07/19/15 2010  WBC 10.1  NEUTROABS 7.3  HGB 14.9  HCT 43.7  MCV 91.4  PLT 595   Basic Metabolic Panel:  Recent Labs Lab 07/19/15 2010  NA 134*  K 4.0  CL 100*  CO2 25  GLUCOSE 170*  BUN 14  CREATININE 0.80  CALCIUM 9.1   GFR: Estimated Creatinine Clearance: 46.4 mL/min (by C-G formula based on Cr of 0.8). Liver Function Tests: No results for input(s): AST, ALT, ALKPHOS, BILITOT, PROT, ALBUMIN in the last 168 hours. No results for input(s): LIPASE, AMYLASE in the last 168 hours. No results for input(s): AMMONIA in the last 168 hours. Coagulation Profile: No results for input(s): INR, PROTIME in the last 168 hours. Cardiac Enzymes:  Recent Labs Lab 07/19/15 2010  TROPONINI 0.03   BNP (last 3 results) No results for input(s): PROBNP in the last 8760 hours. HbA1C: No results for input(s): HGBA1C in the last 72 hours. CBG:  Recent Labs Lab 07/20/15 0800 07/20/15 0953 07/20/15 1035 07/20/15 1147  GLUCAP 455* 425* 395* 270*   Lipid Profile: No results for input(s): CHOL, HDL, LDLCALC, TRIG, CHOLHDL, LDLDIRECT in the last 72 hours. Thyroid Function Tests: No results for input(s): TSH, T4TOTAL, FREET4, T3FREE, THYROIDAB in the last 72 hours. Anemia Panel: No results for input(s): VITAMINB12, FOLATE, FERRITIN, TIBC, IRON, RETICCTPCT in the last 72 hours. Sepsis Labs:  Recent Labs Lab 07/19/15 2031 07/19/15 2252  LATICACIDVEN 2.3* 3.0*    No results found for this or any previous visit (from the past 240 hour(s)).        Radiology Studies: Dg Chest Port 1 View  07/19/2015  CLINICAL DATA:  Acute onset of shortness of breath and productive cough. Bilateral ear fullness and headache. Initial encounter. EXAM: PORTABLE CHEST 1 VIEW COMPARISON:  Chest radiograph performed 12/24/2014, and CT of the chest performed 04/28/2015 FINDINGS: The lungs are well-aerated. Vascular congestion is noted. Increased interstitial markings raise concern for mild interstitial edema. There is no evidence of pleural effusion or pneumothorax. The cardiomediastinal silhouette is within normal limits. No acute osseous abnormalities are seen. IMPRESSION: Vascular congestion noted. Increased interstitial markings raise concern for mild interstitial edema. Electronically Signed   By: Garald Balding M.D.   On: 07/19/2015 20:20        Scheduled Meds: . aspirin EC  81 mg Oral q morning - 10a  . citalopram  40 mg Oral Daily  . docusate sodium  100 mg Oral  BID  . enoxaparin (LOVENOX) injection  40 mg Subcutaneous Q24H  . glipiZIDE  5 mg Oral BID  . insulin aspart  0-20 Units Subcutaneous TID WC  . insulin aspart  5 Units Subcutaneous TID WC  . insulin detemir  18 Units Subcutaneous Daily  . ipratropium-albuterol  3 mL Nebulization Q6H  . levofloxacin (LEVAQUIN) IV  750 mg Intravenous Q24H  . losartan  50 mg Oral Daily  . methylPREDNISolone (SOLU-MEDROL) injection  80 mg Intravenous Q12H  . metoprolol succinate  50 mg Oral Daily  . sodium chloride flush  3 mL Intravenous Q12H   Continuous Infusions: . sodium chloride 75 mL/hr at 07/19/15 2351     LOS: 1 day    Belenda Alviar, Orpah Melter, MD [facility] Pager [phone]  If 7PM-7AM, please contact night-coverage [email or web address] Password [email or web address] 07/20/2015, 1:23 PM

## 2015-07-20 NOTE — Progress Notes (Signed)
Hypoglycemic Event  CBG: 64  Treatment: 15 GM carbohydrate snack  Symptoms: None  Follow-up CBG: Time: 1742 CBG Result: 85  Possible Reasons for Event: Inadequate meal intake  Comments/MD notified: Dr. Juanda Bond, Francetta Found

## 2015-07-20 NOTE — Progress Notes (Signed)
Patient c/o having a cough and would like something, paged on call MD, will follow any new orders received and continue to monitor the patient.

## 2015-07-20 NOTE — Progress Notes (Signed)
Critical lab result lactic lab 3.0 called in by Ed. Agundiz with read back. MD. Notified.
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
## 2015-07-21 ENCOUNTER — Other Ambulatory Visit: Payer: Self-pay | Admitting: Family Medicine

## 2015-07-21 DIAGNOSIS — I10 Essential (primary) hypertension: Secondary | ICD-10-CM | POA: Diagnosis not present

## 2015-07-21 DIAGNOSIS — R627 Adult failure to thrive: Secondary | ICD-10-CM | POA: Diagnosis not present

## 2015-07-21 DIAGNOSIS — J441 Chronic obstructive pulmonary disease with (acute) exacerbation: Secondary | ICD-10-CM | POA: Diagnosis not present

## 2015-07-21 DIAGNOSIS — E119 Type 2 diabetes mellitus without complications: Secondary | ICD-10-CM | POA: Diagnosis not present

## 2015-07-21 DIAGNOSIS — R0600 Dyspnea, unspecified: Secondary | ICD-10-CM | POA: Diagnosis not present

## 2015-07-21 DIAGNOSIS — Z515 Encounter for palliative care: Secondary | ICD-10-CM | POA: Diagnosis not present

## 2015-07-21 DIAGNOSIS — Z7189 Other specified counseling: Secondary | ICD-10-CM | POA: Insufficient documentation

## 2015-07-21 DIAGNOSIS — F418 Other specified anxiety disorders: Secondary | ICD-10-CM | POA: Diagnosis not present

## 2015-07-21 LAB — BASIC METABOLIC PANEL
ANION GAP: 5 (ref 5–15)
BUN: 26 mg/dL — ABNORMAL HIGH (ref 6–20)
CO2: 26 mmol/L (ref 22–32)
Calcium: 8.6 mg/dL — ABNORMAL LOW (ref 8.9–10.3)
Chloride: 106 mmol/L (ref 101–111)
Creatinine, Ser: 0.93 mg/dL (ref 0.44–1.00)
GFR, EST NON AFRICAN AMERICAN: 57 mL/min — AB (ref >60)
GLUCOSE: 217 mg/dL — AB (ref 65–99)
POTASSIUM: 4.1 mmol/L (ref 3.5–5.1)
SODIUM: 137 mmol/L (ref 135–145)

## 2015-07-21 LAB — GLUCOSE, CAPILLARY
GLUCOSE-CAPILLARY: 219 mg/dL — AB (ref 65–99)
GLUCOSE-CAPILLARY: 173 mg/dL — AB (ref 65–99)
Glucose-Capillary: 129 mg/dL — ABNORMAL HIGH (ref 65–99)
Glucose-Capillary: 227 mg/dL — ABNORMAL HIGH (ref 65–99)

## 2015-07-21 MED ORDER — BENZONATATE 100 MG PO CAPS
100.0000 mg | ORAL_CAPSULE | Freq: Three times a day (TID) | ORAL | Status: DC | PRN
  Administered 2015-07-21 – 2015-07-23 (×3): 100 mg via ORAL
  Filled 2015-07-21 (×3): qty 1

## 2015-07-21 MED ORDER — CITALOPRAM HYDROBROMIDE 20 MG PO TABS
20.0000 mg | ORAL_TABLET | Freq: Every day | ORAL | Status: DC
  Administered 2015-07-22 – 2015-07-25 (×4): 20 mg via ORAL
  Filled 2015-07-21 (×5): qty 1

## 2015-07-21 NOTE — Care Management Note (Signed)
Case Management Note  Patient Details  Name: ELEISHA BRANSCOMB MRN: [id number] Date of Birth: [birth year]  Subjective/Objective: Patient is from home, daughter stays with her at night. Patient reports being independent with ADL's, she walks with a cane. Patient states she has been driving herself but her daughter will be getting her to appointments from this point forward. Patient is alert and oriented but will also confer with daughter tomorrow, as patient states her daughter wants her to have [place] services.         Action/Plan: Will talk with daughter. Anticipate DC with [place] services.   Expected Discharge Date:       07/23/2015           Expected Discharge Plan:  [place]  In-House Referral:     Discharge planning Services  CM Consult  Post Acute Care Choice:    Choice offered to:     DME Arranged:    DME Agency:     HH Arranged:    [place] Agency:     Status of Service:  In process, will continue to follow  Medicare Important Message Given:    Date Medicare IM Given:    Medicare IM give by:    Date Additional Medicare IM Given:    Additional Medicare Important Message give by:     If discussed at [place] of Stay Meetings, dates discussed:    Additional Comments:  Britta Louth, Chauncey Reading, RN 07/21/2015, 3:14 PM

## 2015-07-21 NOTE — Progress Notes (Signed)
Daily Progress Note   Patient Name: Marie Holmes       Date: 07/21/2015 DOB: [birth year]  Age: 80 y.o. MRN#: [id number] Attending Physician: Donne Hazel, MD Primary Care Physician: Arnette Norris, MD Admit Date: 07/19/2015  Reason for Consultation/Follow-up: Disposition, Establishing goals of care, Non pain symptom management and Psychosocial/spiritual support  Subjective: Marie Holmes is resting quietly in bed, her daughter [name] is at bedside. She shares with me that she had passed gas yesterday and when she wiped there was mucus with blood. We talk about her history of diverticulosis, and the likelihood that mucus and blood is related to inflammation. I share that the current treatment plan should help with this problem. She talks about the adhesions that were in her abdomen (previously excised), and that when she had her gallbladder removed last year the surgeon was able to work around the adhesions. We talk about her bowel regimen, her daughter shares that they eat vegetable crackers that are high in fiber. We discuss eating more than the recommended daily amount of fiber due to her diverticulosis, also using A&D ointment on her bottom when she has periods of rawness.  We talk about diet, and Marie Holmes shares that she tries to be mindful with her diabetes. She no longer eats fried foods and tries to eat reduced fat.  We talk about bacteria in her urine, but no nitrites or white blood cells, that she may be "colonized", daughter [name] nods yes.  We also talk about her chest CT at the request of the patient and daughter. I share that there is no concern for recurrence or new cancer. We talk about smoking cessation. I share the chronic illness trajectory, and the concepts of do not rehospitalize.   I talk about the benefits of hospice, when the time is right.  Marie Holmes and [name] are very familiar with hospice, Marie Holmes has lost 2 husbands and had the services of hospice during the time of their passing.  Alfonso Patten enters at this time.  Mrs. Ferger states that she would like her daughter [name] to be her healthcare power of attorney, she states her other daughters are busy with their families and her son in [place]'s wife has brain cancer. We talk about advanced directives, and at this time daughter Shirlean Mylar arrives. Marie Holmes  shares that she would like a trial of intubation, that her neighbor had been intubated several times and been able to be weaned off of the ventilator. I share that each cases individual but that we will honor Marie Holmes's wishes.  Marie Holmes shares that if she is unable to weaned from the ventilator after 4 to 10 days, she would like to be extubated and let nature take its course, whether she is able to breathe on her own or not. Daughter Shirlean Mylar asks, "will she suffer". I share that we have medications that would ease her breathing if it looks as though she will not survive.  Marie Holmes states she would not want chest compressions under any circumstances, due to her frail bones. No intubation for cardiovascular events.  Marie Holmes asks about prognosis. I share with her that, "only God knows", but that I would anticipate she could have anywhere from 8 to 24 months, or 16 to 36 months. We talk about functional status, being able to complete ADLs, bathe, cook food. We also talk about COPD exacerbations, causing scarring, hospitalizations, and all these things indicating her prognosis.  No further questions or concerns at this time, I share with Marie Holmes and [name] that I will continue to follow them during this hospitalization. I leave them with chaplain Joya Gaskins to complete HCPOA.  Length of Stay: 2  Current Medications: Scheduled Meds:  . aspirin EC  81  mg Oral q morning - 10a  . [START ON 07/22/2015] citalopram  20 mg Oral Daily  . docusate sodium  100 mg Oral BID  . enoxaparin (LOVENOX) injection  40 mg Subcutaneous Q24H  . glipiZIDE  5 mg Oral BID  . insulin aspart  0-20 Units Subcutaneous TID WC  . insulin detemir  18 Units Subcutaneous Daily  . ipratropium-albuterol  3 mL Nebulization Q6H  . levofloxacin (LEVAQUIN) IV  750 mg Intravenous Q24H  . losartan  50 mg Oral Daily  . methylPREDNISolone (SOLU-MEDROL) injection  80 mg Intravenous Q12H  . metoprolol succinate  50 mg Oral Daily  . nicotine  14 mg Transdermal Daily  . sodium chloride flush  3 mL Intravenous Q12H    Continuous Infusions: . sodium chloride 75 mL/hr at 07/21/15 0639    PRN Meds: acetaminophen **OR** acetaminophen, albuterol, ALPRAZolam, benzonatate, bisacodyl, morphine injection, morphine injection, ondansetron **OR** ondansetron (ZOFRAN) IV, polyethylene glycol  Physical Exam  Constitutional: She is oriented to person, place, and time. No distress.  HENT:  Head: Normocephalic and atraumatic.  Cardiovascular: Normal rate and regular rhythm.   Pulmonary/Chest: Effort normal.  Abdominal: Soft. She exhibits distension. There is no tenderness.  Neurological: She is alert and oriented to person, place, and time.  Skin: Skin is warm and dry.  Psychiatric: She has a normal mood and affect. Her behavior is normal.  Nursing note and vitals reviewed.           Vital Signs: BP 108/71 mmHg  Pulse 93  Temp(Src) 97.6 F (36.4 C) (Oral)  Resp 18  Ht 5' (1.524 m)  Wt 59.739 kg (131 lb 11.2 oz)  BMI 25.72 kg/m2  SpO2 94% SpO2: SpO2: 94 % O2 Device: O2 Device: Not Delivered O2 Flow Rate: O2 Flow Rate (L/min): 2 L/min  Intake/output summary:  Intake/Output Summary (Last 24 hours) at 07/21/15 1230 Last data filed at 07/20/15 1852  Gross per 24 hour  Intake   1365 ml  Output    600 ml  Net    765 ml  LBM: Last BM Date: 07/19/15 Baseline Weight: Weight:  58.469 kg (128 lb 14.4 oz) Most recent weight: Weight: 59.739 kg (131 lb 11.2 oz)       Palliative Assessment/Data:    Flowsheet Rows        Most Recent Value   Intake Tab    Referral Department  Hospitalist   Unit at Time of Referral  Med/Surg Unit   Palliative Care Primary Diagnosis  Pulmonary   Date Notified  07/19/15   Palliative Care Type  New Palliative care   Reason for referral  Clarify Goals of Care, Psychosocial or Spiritual support   Date of Admission  07/19/15   Date first seen by Palliative Care  07/20/15   # of days Palliative referral response time  1 Day(s)   # of days IP prior to Palliative referral  0   Clinical Assessment    Palliative Performance Scale Score  40%   Pain Max last 24 hours  Not able to report   Pain Min Last 24 hours  Not able to report   Dyspnea Max Last 24 Hours  Not able to report   Dyspnea Min Last 24 hours  Not able to report   Psychosocial & Spiritual Assessment    Palliative Care Outcomes    Patient/Family meeting held?  Yes   Who was at the meeting?  Family meeting held with patient and daughter [name] at bedside   Patient/Family wishes: Interventions discontinued/not started   Mechanical Ventilation   Palliative Care follow-up planned  -- [Follow up while at [place]      Patient Active Problem List   Diagnosis Date Noted  . Palliative care encounter   . Goals of care, counseling/discussion   . Dyspnea 07/19/2015  . FTT (failure to thrive) in adult 07/19/2015  . Depression with anxiety 07/19/2015  . Acute exacerbation of chronic obstructive pulmonary disease (COPD) ([place]) 07/19/2015  . Angiolipoma 05/04/2015  . Medicare annual wellness visit, subsequent 03/05/2015  . Leg mass 03/05/2015  . Diabetes mellitus, insulin dependent (IDDM), controlled ([place]) 12/23/2014  . Palpitations 09/15/2014  . Loss of weight 07/22/2014  . Tremor 07/22/2014  . Hematuria 07/01/2014  . Chronic anxiety 06/21/2014  . Skin lesions 02/27/2014  .  Tobacco abuse 12/26/2013  . Chronic cholecystitis with calculus 12/11/2013  . Nausea alone 11/08/2013  . Gall bladder pain 11/08/2013  . Chest pain 10/30/2013  . COPD with acute exacerbation ([place]) 10/27/2013  . Bowel incontinence 08/12/2013  . Dysuria 08/12/2013  . Lymphadenitis 06/19/2013  . Back pain 04/11/2013  . Urinary frequency 02/27/2013  . Non Hodgkin's lymphoma ([place]) 11/28/2012  . COLONIC POLYPS 09/24/2008  . HLD (hyperlipidemia) 05/11/2006  . Essential hypertension 05/11/2006  . Venous (peripheral) insufficiency 05/11/2006  . COPD 05/11/2006  . GASTROESOPHAGEAL REFLUX, NO ESOPHAGITIS 04/20/2006  . IRRITABLE BOWEL SYNDROME 04/20/2006  . OSTEOARTHRITIS, MULTI SITES 04/20/2006  . Osteoporosis, unspecified 04/20/2006  . INSOMNIA NOS 04/20/2006  . INCONTINENCE, URGE 04/20/2006    Palliative Care Assessment & Plan   Patient Profile: Marie Holmes is a 80 year old female with a history of diabetes, hypertension, COPD, lung cancer with radiation treatment, anxiety and depression, GERD, diverticulosis, colon polyps, and C. difficile colitis in April 2016.  She lives with her daughter [name], and is independent with ADLs for the most part. She no longer drives. And relies on her daughter to get groceries and take her to appointments.  She has had one hospitalization in the last 6 months, and shares that  she has been seeing a new primary care provider, [name], and is scheduled for full physical in July this year.   Assessment: as above  Recommendations/Plan:  continue to treat the treatable, code status as discussed, no chest compressions.   We discussed home health services as needed.  We discussed the benefits of hospice when the time is right.  Goals of Care and Additional Recommendations:  Limitations on Scope of Treatment: No chest compressions, intubation for limited amount of time, goals discussed with daughters [name] and Shirlean Mylar present.  Code Status:    Code Status  Orders        Start     Ordered   07/21/15 1224  Limited resuscitation (code)   Continuous    Comments:  Marie Holmes states her desire is for a trial period of intubation, no more than 10 days, if she were to have respiratory dysfunction only. No intubation for cardiovascular events.  If she is unable to to wean off of the ventilator, she would prefer to be extubated regardless of outcome, let nature take its course.  Marie Holmes also states she would not want chest compressions under any circumstance due to her frail bones.  Question Answer Comment  In the event of cardiac or respiratory ARREST: Initiate Code Blue, Call Rapid Response No   In the event of cardiac or respiratory ARREST: Perform CPR No   In the event of cardiac or respiratory ARREST: Perform Intubation/Mechanical Ventilation Yes   In the event of cardiac or respiratory ARREST: Use NIPPV/BiPAp only if indicated Yes   In the event of cardiac or respiratory ARREST: Administer ACLS medications if indicated Yes   In the event of cardiac or respiratory ARREST: Perform Defibrillation or Cardioversion if indicated No      07/21/15 1226    Code Status History    Date Active Date Inactive Code Status Order ID Comments User Context   07/20/2015  1:33 PM 07/21/2015 12:26 PM Partial Code [id number]  Donne Hazel, MD Inpatient   07/19/2015 11:16 PM 07/20/2015  1:33 PM Full Code [id number]  Roney Jaffe, MD Inpatient   12/24/2014  3:53 PM 12/25/2014  5:42 PM Full Code [id number]  Erline Hau, MD Inpatient   06/20/2014  6:25 PM 06/24/2014  8:43 PM Full Code [id number]  Doree Albee, MD ED   12/11/2013  7:02 PM 12/12/2013 12:57 PM Full Code [id number]  Donnie Mesa, MD Inpatient   10/30/2013  2:01 PM 11/02/2013 11:08 PM Partial Code [id number]  Kathie Dike, MD Inpatient   10/27/2013  9:45 PM 10/28/2013  5:57 PM Partial Code [id number]  Kathie Dike, MD ED    Advance Directive Documentation        Most Recent Value   Type of Advance  Directive  Living will   Pre-existing out of facility DNR order (yellow form or pink MOST form)     "MOST" Form in Place?         Prognosis:   > 12 months, Likely.  Discharge Planning:  Home with Home Health if qualified.  Care plan was discussed with nursing staff, case manager, social worker, and Dr. Wyline Copas on next rounds.  Thank you for allowing the Palliative Medicine Team to assist in the care of this patient.   Time In: 1015 Time Out: 1055 Total Time 40 minutes Prolonged Time Billed  no       Greater than 50%  of this time was spent counseling and coordinating care related  to the above assessment and plan.  Merrell Borsuk A, NP  Please contact Palliative Medicine Team phone at [phone] for questions and concerns.

## 2015-07-21 NOTE — Progress Notes (Signed)
PROGRESS NOTE    Marie Holmes  GXQ:[id number] DOB: 02/24/1935 DOA: 07/19/2015 PCP: Arnette Norris, MD    Brief Narrative:  80 y.o. female with history of DM, HTN and COPD who presents to ED with several day hx of worsening SOB and cough. The cough is productive of greenish sputum and she has been having chills and sweats for several days too. Per daughter she is dyspneic "all the time", and can only move about the house minimally due to DOE at baseline. Today she found her looking worse so called EMS. Having chest pain ant chest w coughing only. No abd pain, n//vd, no dysuria or joint pain. No HA, sore throat or hemoptysis. In ED patient's HR 94 w RR 20, HR increased after neb Rx to 150 then now down to 122. CXR shows poss vasc congestion, but all her old AP projections show the same. ASked to see for admission   Assessment & Plan:   Principal Problem:   COPD with acute exacerbation ([place]) Active Problems:   Essential hypertension   Tobacco abuse   Tremor   Diabetes mellitus, insulin dependent (IDDM), controlled ([facility])   Dyspnea   FTT (failure to thrive) in adult   Depression with anxiety   Acute exacerbation of chronic obstructive pulmonary disease (COPD) ([place])   Palliative care encounter   Goals of care, counseling/discussion   1. Dyspnea/ acute resp failure/ acute exacerbation of COPD 1. Patient admitted to medical floor 2. Continue scheduled nebs, steroids, empiric antibiotics 3. Patient continues with decreased breath sounds with end expiratory wheezing. 4. Continue supplemental O2 as needed 2. DM2 on insulin  1. Cont levemir 2. Cont SSI on resistant scale given concurrent steroid use. 3. HTN cont home meds 1. Blood pressure currently stable 2. Continue monitor 4. Tobacco use 1. Patient with active tobacco abuse. She is willing to try a nicotine patch will here. 5. FTT  1. not doing well at home per family, very SOB 'all the time" 2. Palliative care consulted.  Appreciate input. Patient wishes are for limited CODE STATUS: OK for intubation for respiratory distress, but no CPR 6. Depression / anxiety - cont meds 7. Tremor - seems to be sporadic and at times nonexistent. Continue to monitor 8. Hist of Non-Hodgkins lymphoma 9. Concerns for possible urinary tract infection 1. Urinalysis noted to have many bacteria but otherwise no leukocytes, no nitrites, 0-5 WBCs. Doubt urinary tract infection. Instead, more suggestive of asymptomatic bacteriuria which is normally not treated 2. Cont to monitor   DVT prophylaxis: Lovenox Code Status: Limited code: No intubation Family Communication: Patient in room, family at bedside Disposition Plan: Uncertain at this time   Consultants:   Palliative care  Procedures:     Antimicrobials:  Anti-infectives    Start     Dose/Rate Route Frequency Ordered Stop   07/19/15 2330  levofloxacin (LEVAQUIN) IVPB 750 mg     750 mg 100 mL/hr over 90 Minutes Intravenous Every 24 hours 07/19/15 2316            Subjective: Reports feeling mildly sob, somewhat better  Objective: Filed Vitals:   07/21/15 0137 07/21/15 0613 07/21/15 0910 07/21/15 0929  BP:  103/47  108/71  Pulse:  93  93  Temp:  97.6 F (36.4 C)    TempSrc:  Oral    Resp:  18    Height:      Weight:  59.739 kg (131 lb 11.2 oz)    SpO2: 97%  94%  Intake/Output Summary (Last 24 hours) at 07/21/15 1154 Last data filed at 07/20/15 1852  Gross per 24 hour  Intake   1365 ml  Output    600 ml  Net    765 ml   Filed Weights   07/20/15 0652 07/21/15 0613  Weight: 58.469 kg (128 lb 14.4 oz) 59.739 kg (131 lb 11.2 oz)    Examination:  General exam: Appears calm and comfortable , Lying in bed Respiratory system: Decreased breath sounds, normal resp effort, end-expiratory wheezing Cardiovascular system: S1 & S2 heard, RRR. Gastrointestinal system: Abdomen is nondistended, soft and nontender. No organomegaly or masses felt. Normal bowel  sounds heard. Central nervous system: Alert and oriented. No focal neurological deficits. Extremities: Symmetric 5 x 5 power. Skin: No rashes, lesions Psychiatry: Judgement and insight appear normal. Normal mood and affect.     Data Reviewed: I have personally reviewed following labs and imaging studies  CBC:  Recent Labs Lab 07/19/15 2010  WBC 10.1  NEUTROABS 7.3  HGB 14.9  HCT 43.7  MCV 91.4  PLT 976   Basic Metabolic Panel:  Recent Labs Lab 07/19/15 2010 07/21/15 0702  NA 134* 137  K 4.0 4.1  CL 100* 106  CO2 25 26  GLUCOSE 170* 217*  BUN 14 26*  CREATININE 0.80 0.93  CALCIUM 9.1 8.6*   GFR: Estimated Creatinine Clearance: 40.3 mL/min (by C-G formula based on Cr of 0.93). Liver Function Tests: No results for input(s): AST, ALT, ALKPHOS, BILITOT, PROT, ALBUMIN in the last 168 hours. No results for input(s): LIPASE, AMYLASE in the last 168 hours. No results for input(s): AMMONIA in the last 168 hours. Coagulation Profile: No results for input(s): INR, PROTIME in the last 168 hours. Cardiac Enzymes:  Recent Labs Lab 07/19/15 2010  TROPONINI 0.03   BNP (last 3 results) No results for input(s): PROBNP in the last 8760 hours. HbA1C: No results for input(s): HGBA1C in the last 72 hours. CBG:  Recent Labs Lab 07/20/15 1643 07/20/15 1746 07/20/15 2111 07/21/15 0737 07/21/15 1137  GLUCAP 64* 85 172* 219* 173*   Lipid Profile: No results for input(s): CHOL, HDL, LDLCALC, TRIG, CHOLHDL, LDLDIRECT in the last 72 hours. Thyroid Function Tests: No results for input(s): TSH, T4TOTAL, FREET4, T3FREE, THYROIDAB in the last 72 hours. Anemia Panel: No results for input(s): VITAMINB12, FOLATE, FERRITIN, TIBC, IRON, RETICCTPCT in the last 72 hours. Sepsis Labs:  Recent Labs Lab 07/19/15 2031 07/19/15 2252  LATICACIDVEN 2.3* 3.0*    No results found for this or any previous visit (from the past 240 hour(s)).       Radiology Studies: Dg Chest Port 1  View  07/19/2015  CLINICAL DATA:  Acute onset of shortness of breath and productive cough. Bilateral ear fullness and headache. Initial encounter. EXAM: PORTABLE CHEST 1 VIEW COMPARISON:  Chest radiograph performed 12/24/2014, and CT of the chest performed 04/28/2015 FINDINGS: The lungs are well-aerated. Vascular congestion is noted. Increased interstitial markings raise concern for mild interstitial edema. There is no evidence of pleural effusion or pneumothorax. The cardiomediastinal silhouette is within normal limits. No acute osseous abnormalities are seen. IMPRESSION: Vascular congestion noted. Increased interstitial markings raise concern for mild interstitial edema. Electronically Signed   By: Garald Balding M.D.   On: 07/19/2015 20:20        Scheduled Meds: . aspirin EC  81 mg Oral q morning - 10a  . [START ON 07/22/2015] citalopram  20 mg Oral Daily  . docusate sodium  100 mg  Oral BID  . enoxaparin (LOVENOX) injection  40 mg Subcutaneous Q24H  . glipiZIDE  5 mg Oral BID  . insulin aspart  0-20 Units Subcutaneous TID WC  . insulin detemir  18 Units Subcutaneous Daily  . ipratropium-albuterol  3 mL Nebulization Q6H  . levofloxacin (LEVAQUIN) IV  750 mg Intravenous Q24H  . losartan  50 mg Oral Daily  . methylPREDNISolone (SOLU-MEDROL) injection  80 mg Intravenous Q12H  . metoprolol succinate  50 mg Oral Daily  . nicotine  14 mg Transdermal Daily  . sodium chloride flush  3 mL Intravenous Q12H   Continuous Infusions: . sodium chloride 75 mL/hr at 07/21/15 0639     LOS: 2 days    Mitsuko Luera, Orpah Melter, MD [facility] Pager [phone]  If 7PM-7AM, please contact night-coverage [email or web address] Password [email or web address] 07/21/2015, 11:54 AM

## 2015-07-21 NOTE — Progress Notes (Signed)
Patient was referred to address Advance Directives. She was talking with Quinn Axe, NP/PMT as I enter. They were in the middle of a discussion about her healthcare wishes as they relate to end of life decisions.   Our discussion follows through the details of Advance Directives. We discuss aspects of what she wants to happen for her care with her daughters both present to hear her wishes. I confirm with Ms Lenig her expressions of her wishes. She wanted to review and consider the details. I will follow up with her to complete the documents.

## 2015-07-21 NOTE — Consult Note (Signed)
   [facility] CM Inpatient Consult   07/21/2015  Marie Holmes 08-31-1935 [id number]   Spoke with patient at bedside regarding [facility] services. Patient does not wish to participate with [facility] at this time. Patient given [facility] brochure and contact information for future reference, voices appreciation of information.    Of note, [facility] Care Management services would not replace or interfere with any services that are arranged by inpatient case management or social work. For additional questions or referrals please contact:   Royetta Crochet. Laymond Purser, RN, BSN, [facility] Liaison [phone]

## 2015-07-22 DIAGNOSIS — Z7189 Other specified counseling: Secondary | ICD-10-CM | POA: Diagnosis not present

## 2015-07-22 DIAGNOSIS — F418 Other specified anxiety disorders: Secondary | ICD-10-CM | POA: Diagnosis not present

## 2015-07-22 DIAGNOSIS — J441 Chronic obstructive pulmonary disease with (acute) exacerbation: Principal | ICD-10-CM

## 2015-07-22 DIAGNOSIS — R627 Adult failure to thrive: Secondary | ICD-10-CM | POA: Diagnosis not present

## 2015-07-22 DIAGNOSIS — E119 Type 2 diabetes mellitus without complications: Secondary | ICD-10-CM | POA: Diagnosis not present

## 2015-07-22 DIAGNOSIS — Z515 Encounter for palliative care: Secondary | ICD-10-CM | POA: Diagnosis not present

## 2015-07-22 DIAGNOSIS — I10 Essential (primary) hypertension: Secondary | ICD-10-CM

## 2015-07-22 DIAGNOSIS — Z794 Long term (current) use of insulin: Secondary | ICD-10-CM

## 2015-07-22 DIAGNOSIS — Z720 Tobacco use: Secondary | ICD-10-CM | POA: Diagnosis not present

## 2015-07-22 LAB — GLUCOSE, CAPILLARY
GLUCOSE-CAPILLARY: 201 mg/dL — AB (ref 65–99)
GLUCOSE-CAPILLARY: 195 mg/dL — AB (ref 65–99)
Glucose-Capillary: 230 mg/dL — ABNORMAL HIGH (ref 65–99)
Glucose-Capillary: 188 mg/dL — ABNORMAL HIGH (ref 65–99)

## 2015-07-22 MED ORDER — METHYLPREDNISOLONE SODIUM SUCC 125 MG IJ SOLR
80.0000 mg | Freq: Three times a day (TID) | INTRAMUSCULAR | Status: DC
  Administered 2015-07-22 – 2015-07-25 (×8): 80 mg via INTRAVENOUS
  Filled 2015-07-22 (×8): qty 2

## 2015-07-22 MED ORDER — FUROSEMIDE 10 MG/ML IJ SOLN
20.0000 mg | Freq: Once | INTRAMUSCULAR | Status: AC
  Administered 2015-07-22: 20 mg via INTRAVENOUS
  Filled 2015-07-22: qty 2

## 2015-07-22 MED ORDER — GUAIFENESIN ER 600 MG PO TB12
1200.0000 mg | ORAL_TABLET | Freq: Two times a day (BID) | ORAL | Status: DC
  Administered 2015-07-22 – 2015-07-25 (×7): 1200 mg via ORAL
  Filled 2015-07-22 (×7): qty 2

## 2015-07-22 NOTE — Progress Notes (Signed)
Assisted patient with the completion of her Advance Directives. A copy was placed in her chart and her requested copies were given to the patient.

## 2015-07-22 NOTE — Progress Notes (Signed)
PROGRESS NOTE    Marie Holmes  NWG:[id number] DOB: 19-Dec-1935 DOA: 07/19/2015 PCP: Arnette Norris, MD   Brief Narrative:  3 yof with history of DM, HTN, and COPD presented with complaints of worsening SOB, chest pain, and productive cough with greenish sputum. She also complained of associated chills and sweats. At baseline she is reported to have DOE. In the ED, CXR revealed possible vascular congestion. She was referred for admission.    Assessment & Plan:   Principal Problem:   COPD with acute exacerbation ([place]) Active Problems:   Essential hypertension   Tobacco abuse   Tremor   Diabetes mellitus, insulin dependent (IDDM), controlled (HCC)   Dyspnea   FTT (failure to thrive) in adult   Depression with anxiety   Acute exacerbation of chronic obstructive pulmonary disease (COPD) ([place])   Palliative care encounter   Goals of care, counseling/discussion   DNR (do not resuscitate) discussion  1. Acute on chronic respiratory failure, secondary to COPD exacerbation. Continue supplemental [place]. Wean as tolerated.  2. COPD exacerbation, still short of breath and wheezing. Continue on nebs, steroids, and empiric abx. Encourage pulmonary hygiene 3. DM Type 2 Continue insulin and continue levemir.  4. Essential HTN, stable. Continue home medications.  5. Tobacco use. Counseled on cessation. Continue nicotine patch.  6. FTT, Possibly related to advanced respiratory illness. Pt is DOE at baseline. PMT consulted. Pt wished for limited code status:request intubation but no CPR.  7. Depression/Anxiety, continue medications.  8. Tremor. Continue to monitor.  9. History of non-hodgkin's lymphoma. Followed by oncology    DVT prophylaxis: Lovenox  Code Status: Limited: Request intubation but no CPR.  Family Communication: Friend bedside.  Disposition Plan: Anticipate discharge in 24-48 hours.    Consultants:   Palliative Care   Procedures:   None   Antimicrobials:   Levaquin  5/28>>   Subjective: Breathing is improving. Mild nonproductive cough, notices flatus when she coughs. Mild fluid retention in hands. Abdominal pain for the last 3-4 weeks is consistent. Urinating without difficulty. Bowel movements are consistent.   Objective: Filed Vitals:   07/22/15 0110 07/22/15 0502 07/22/15 0622 07/22/15 0736  BP:  140/121 153/68   Pulse:  99    Temp:  97.7 F (36.5 C)    TempSrc:  Oral    Resp:  18    Height:      Weight:  60.873 kg (134 lb 3.2 oz)    SpO2: 96% 98%  99%    Intake/Output Summary (Last 24 hours) at 07/22/15 0958 Last data filed at 07/22/15 0301  Gross per 24 hour  Intake 2861.25 ml  Output      0 ml  Net 2861.25 ml   Filed Weights   07/20/15 0652 07/21/15 0613 07/22/15 0502  Weight: 58.469 kg (128 lb 14.4 oz) 59.739 kg (131 lb 11.2 oz) 60.873 kg (134 lb 3.2 oz)    Examination:  General exam: Appears calm and comfortable  Respiratory system: mild wheeze bilaterally with diminished breath sounds. Respiratory effort normal. Cardiovascular system: S1 & S2 heard, RRR. No JVD, murmurs, rubs, gallops or clicks. trace pedal edema. Gastrointestinal system:Abdomen is nondistended, soft and tender in the RLQ. No organomegaly or masses felt. Normal bowel sounds heard. Psychiatry: Judgement and insight appear normal. Mood & affect appropriate.     Data Reviewed: I have personally reviewed following labs and imaging studies  CBC:  Recent Labs Lab 07/19/15 2010  WBC 10.1  NEUTROABS 7.3  HGB 14.9  HCT  43.7  MCV 91.4  PLT 290   Basic Metabolic Panel:  Recent Labs Lab 07/19/15 2010 07/21/15 0702  NA 134* 137  K 4.0 4.1  CL 100* 106  CO2 25 26  GLUCOSE 170* 217*  BUN 14 26*  CREATININE 0.80 0.93  CALCIUM 9.1 8.6*   Cardiac Enzymes:  Recent Labs Lab 07/19/15 2010  TROPONINI 0.03   CBG:  Recent Labs Lab 07/21/15 0737 07/21/15 1137 07/21/15 1702 07/21/15 2050 07/22/15 0716  GLUCAP 219* 173* 129* 227* 230*  Urine  analysis:    Component Value Date/Time   COLORURINE YELLOW 07/20/2015 0530   APPEARANCEUR HAZY* 07/20/2015 0530   LABSPEC 1.020 07/20/2015 0530   PHURINE 5.5 07/20/2015 0530   GLUCOSEU >1000* 07/20/2015 0530   HGBUR NEGATIVE 07/20/2015 0530   BILIRUBINUR NEGATIVE 07/20/2015 0530   BILIRUBINUR neg 03/05/2015 0944   KETONESUR NEGATIVE 07/20/2015 0530   PROTEINUR NEGATIVE 07/20/2015 0530   PROTEINUR neg 03/05/2015 0944   UROBILINOGEN 0.2 03/05/2015 0944   UROBILINOGEN 0.2 10/25/2014 1839   NITRITE NEGATIVE 07/20/2015 0530   NITRITE neg 03/05/2015 0944   LEUKOCYTESUR NEGATIVE 07/20/2015 0530   Sepsis Labs: '@LABRCNTIP'$ (procalcitonin:4,lacticidven:4)  )No results found for this or any previous visit (from the past 240 hour(s)).       Radiology Studies: No results found.      Scheduled Meds: . aspirin EC  81 mg Oral q morning - 10a  . citalopram  20 mg Oral Daily  . docusate sodium  100 mg Oral BID  . enoxaparin (LOVENOX) injection  40 mg Subcutaneous Q24H  . glipiZIDE  5 mg Oral BID  . insulin aspart  0-20 Units Subcutaneous TID WC  . insulin detemir  18 Units Subcutaneous Daily  . ipratropium-albuterol  3 mL Nebulization Q6H  . levofloxacin (LEVAQUIN) IV  750 mg Intravenous Q24H  . losartan  50 mg Oral Daily  . methylPREDNISolone (SOLU-MEDROL) injection  80 mg Intravenous Q12H  . metoprolol succinate  50 mg Oral Daily  . nicotine  14 mg Transdermal Daily  . sodium chloride flush  3 mL Intravenous Q12H   Continuous Infusions:    LOS: 3 days    Time spent: 25 minutes.     Kathie Dike, MD [facility] Pager [phone]  If 7PM-7AM, please contact night-coverage [email or web address] Password [email or web address] 07/22/2015, 9:58 AM    By signing my name below, I, Rennis Harding, attest that this documentation has been prepared under the direction and in the presence of Kathie Dike, MD. Electronically signed: Rennis Harding, Scribe. 07/22/2015 12:37pm   I, Dr.  Kathie Dike, personally performed the services described in this documentaiton. All medical record entries made by the scribe were at my direction and in my presence. I have reviewed the chart and agree that the record reflects my personal performance and is accurate and complete  Kathie Dike, MD, 07/22/2015 1:09 PM

## 2015-07-22 NOTE — Progress Notes (Signed)
Daily Progress Note   Patient Name: Marie Holmes       Date: 07/22/2015 DOB: 26-Jan-1936  Age: 80 y.o. MRN#: [id number] Attending Physician: Kathie Dike, MD Primary Care Physician: Arnette Norris, MD Admit Date: 07/19/2015  Reason for Consultation/Follow-up: Disposition, Establishing goals of care and Psychosocial/spiritual support  Subjective: Marie Holmes is resting quietly in bed. She makes and keeps eye contact, greeting me as I enter. She shares that her abdomen is swollen somewhat, she thinks from fluid retention.  She feels like her breathing is about the same. She shares that she has completed her advance directive paperwork with her daughter and that she will have signed and notarized today.  Meeting with daughter, Marie Holmes, in the hallway. Marie Holmes continues to state that she feels her mother is not accepting/aware of the severity of her heart and lung conditions. We talk about functional status, and involving hospice when appropriate. I share with Marie Holmes that we will continue to work with them in finding the best support.  Length of Stay: 3  Current Medications: Scheduled Meds:  . aspirin EC  81 mg Oral q morning - 10a  . citalopram  20 mg Oral Daily  . docusate sodium  100 mg Oral BID  . enoxaparin (LOVENOX) injection  40 mg Subcutaneous Q24H  . furosemide  20 mg Intravenous Once  . glipiZIDE  5 mg Oral BID  . guaiFENesin  1,200 mg Oral BID  . insulin aspart  0-20 Units Subcutaneous TID WC  . insulin detemir  18 Units Subcutaneous Daily  . ipratropium-albuterol  3 mL Nebulization Q6H  . levofloxacin (LEVAQUIN) IV  750 mg Intravenous Q24H  . losartan  50 mg Oral Daily  . methylPREDNISolone (SOLU-MEDROL) injection  80 mg Intravenous Q8H  . metoprolol succinate  50 mg Oral Daily    . nicotine  14 mg Transdermal Daily  . sodium chloride flush  3 mL Intravenous Q12H    Continuous Infusions:    PRN Meds: acetaminophen **OR** acetaminophen, albuterol, ALPRAZolam, benzonatate, bisacodyl, morphine injection, morphine injection, ondansetron **OR** ondansetron (ZOFRAN) IV, polyethylene glycol  Physical Exam  Constitutional: She is oriented to person, place, and time. No distress.  HENT:  Head: Normocephalic and atraumatic.  Cardiovascular: Normal rate and regular rhythm.   Pulmonary/Chest: Effort normal. No respiratory distress.  Abdominal: Soft. She  exhibits distension. There is no guarding.  Neurological: She is alert and oriented to person, place, and time.  Skin: Skin is warm and dry.            Vital Signs: BP 153/68 mmHg  Pulse 99  Temp(Src) 97.7 F (36.5 C) (Oral)  Resp 18  Ht 5' (1.524 m)  Wt 60.873 kg (134 lb 3.2 oz)  BMI 26.21 kg/m2  SpO2 99% SpO2: SpO2: 99 % O2 Device: O2 Device: Nasal Cannula O2 Flow Rate: O2 Flow Rate (L/min): 3 L/min  Intake/output summary:  Intake/Output Summary (Last 24 hours) at 07/22/15 1423 Last data filed at 07/22/15 0301  Gross per 24 hour  Intake 2861.25 ml  Output      0 ml  Net 2861.25 ml   LBM: Last BM Date: 07/22/15 Baseline Weight: Weight: 58.469 kg (128 lb 14.4 oz) Most recent weight: Weight: 60.873 kg (134 lb 3.2 oz)       Palliative Assessment/Data:    Flowsheet Rows        Most Recent Value   Intake Tab    Referral Department  Hospitalist   Unit at Time of Referral  Med/Surg Unit   Palliative Care Primary Diagnosis  Pulmonary   Date Notified  07/19/15   Palliative Care Type  New Palliative care   Reason for referral  Clarify Goals of Care, Psychosocial or Spiritual support   Date of Admission  07/19/15   Date first seen by Palliative Care  07/20/15   # of days Palliative referral response time  1 Day(s)   # of days IP prior to Palliative referral  0   Clinical Assessment    Palliative  Performance Scale Score  40%   Pain Max last 24 hours  Not able to report   Pain Min Last 24 hours  Not able to report   Dyspnea Max Last 24 Hours  Not able to report   Dyspnea Min Last 24 hours  Not able to report   Psychosocial & Spiritual Assessment    Palliative Care Outcomes    Patient/Family meeting held?  Yes   Who was at the meeting?  Family meeting held with patient and daughter Marie Holmes at bedside   Patient/Family wishes: Interventions discontinued/not started   Mechanical Ventilation   Palliative Care follow-up planned  -- [Follow up while at [place]      Patient Active Problem List   Diagnosis Date Noted  . DNR (do not resuscitate) discussion   . Palliative care encounter   . Goals of care, counseling/discussion   . Dyspnea 07/19/2015  . FTT (failure to thrive) in adult 07/19/2015  . Depression with anxiety 07/19/2015  . Acute exacerbation of chronic obstructive pulmonary disease (COPD) ([place]) 07/19/2015  . Angiolipoma 05/04/2015  . Medicare annual wellness visit, subsequent 03/05/2015  . Leg mass 03/05/2015  . Diabetes mellitus, insulin dependent (IDDM), controlled ([place]) 12/23/2014  . Palpitations 09/15/2014  . Loss of weight 07/22/2014  . Tremor 07/22/2014  . Hematuria 07/01/2014  . Chronic anxiety 06/21/2014  . Skin lesions 02/27/2014  . Tobacco abuse 12/26/2013  . Chronic cholecystitis with calculus 12/11/2013  . Nausea alone 11/08/2013  . Gall bladder pain 11/08/2013  . Chest pain 10/30/2013  . COPD with acute exacerbation ([place]) 10/27/2013  . Bowel incontinence 08/12/2013  . Dysuria 08/12/2013  . Lymphadenitis 06/19/2013  . Back pain 04/11/2013  . Urinary frequency 02/27/2013  . Non Hodgkin's lymphoma ([place]) 11/28/2012  . COLONIC POLYPS 09/24/2008  . HLD (  hyperlipidemia) 05/11/2006  . Essential hypertension 05/11/2006  . Venous (peripheral) insufficiency 05/11/2006  . COPD 05/11/2006  . GASTROESOPHAGEAL REFLUX, NO ESOPHAGITIS 04/20/2006  . IRRITABLE BOWEL  SYNDROME 04/20/2006  . OSTEOARTHRITIS, MULTI SITES 04/20/2006  . Osteoporosis, unspecified 04/20/2006  . INSOMNIA NOS 04/20/2006  . INCONTINENCE, URGE 04/20/2006    Palliative Care Assessment & Plan   Patient Profile: Marie Holmes is a 80 year old female with a history of diabetes, hypertension, COPD, lung cancer with radiation treatment, anxiety and depression, GERD, diverticulosis, colon polyps, and C. difficile colitis in April 2016. She lives with her daughter Marie Holmes, and is independent with ADLs for the most part. She no longer drives. And relies on her daughter to get groceries and take her to appointments. She has had one hospitalization in the last 6 months, and shares that she has been seeing a new primary care provider, [name], and is scheduled for full physical in July this year.   Assessment: as above  Recommendations/Plan:  continue to treat the treatable, code status as discussed, no chest compressions. We discussed home health services as needed. We discussed the benefits of hospice when the time is right.  Goals of Care and Additional Recommendations:  Limitations on Scope of Treatment: No chest compressions, intubation for limited amount of time.  Code Status:    Code Status Orders        Start     Ordered   07/22/15 1422  Limited resuscitation (code)   Continuous    Comments:  Mrs. Virgia Land states her desire is for a trial period of intubation, no more than 4 days, if she were to have respiratory dysfunction only. No intubation for cardiovascular events.  If she is unable to to wean off of the ventilator, she would prefer to be extubated regardless of outcome, let nature take its course.  Mrs. Rigdon also states she would not want chest compressions under any circumstance due to her frail bones.   NO PEG tube.  Question Answer Comment  In the event of cardiac or respiratory ARREST: Initiate Code Blue, Call Rapid Response No   In the event of cardiac or  respiratory ARREST: Perform CPR No   In the event of cardiac or respiratory ARREST: Perform Intubation/Mechanical Ventilation Yes   In the event of cardiac or respiratory ARREST: Use NIPPV/BiPAp only if indicated Yes   In the event of cardiac or respiratory ARREST: Administer ACLS medications if indicated Yes   In the event of cardiac or respiratory ARREST: Perform Defibrillation or Cardioversion if indicated No      07/22/15 1422    Code Status History    Date Active Date Inactive Code Status Order ID Comments User Context   07/21/2015 12:26 PM 07/22/2015  2:22 PM Partial Code [id number] Mrs. Virgia Land states her desire is for a trial period of intubation, no more than 10 days, if she were to have respiratory dysfunction only. No intubation for cardiovascular events.  If she is unable to to wean off of the ventilator, she would prefer to be extubated regardless of outcome, let nature take its course.  Mrs. Yearick also states she would not want chest compressions under any circumstance due to her frail bones. Drue Novel, NP Inpatient   07/20/2015  1:33 PM 07/21/2015 12:26 PM Partial Code [id number]  Donne Hazel, MD Inpatient   07/19/2015 11:16 PM 07/20/2015  1:33 PM Full Code [id number]  Roney Jaffe, MD Inpatient   12/24/2014  3:53 PM 12/25/2014  5:42  PM Full Code [id number]  Erline Hau, MD Inpatient   06/20/2014  6:25 PM 06/24/2014  8:43 PM Full Code [id number]  Doree Albee, MD ED   12/11/2013  7:02 PM 12/12/2013 12:57 PM Full Code [id number]  Donnie Mesa, MD Inpatient   10/30/2013  2:01 PM 11/02/2013 11:08 PM Partial Code [id number]  Kathie Dike, MD Inpatient   10/27/2013  9:45 PM 10/28/2013  5:57 PM Partial Code [id number]  Kathie Dike, MD ED    Advance Directive Documentation        Most Recent Value   Type of Advance Directive  Living will   Pre-existing out of facility DNR order (yellow form or pink MOST form)     "MOST" Form in Place?         Prognosis:   Unable to  determine, based on outcomes, including functional status, COPD exacerbations, heart failure exacerbations.  Discharge Planning:  Home with home health if qualified, possibly rehab if increased length of stay.  Care plan was discussed with Nursing staff, case manager, social worker, and Dr. Roderic Palau.  Thank you for allowing the Palliative Medicine Team to assist in the care of this patient.   Time In: 1130 Time Out: 1155 Total Time 25 minutes Prolonged Time Billed  no       Greater than 50%  of this time was spent counseling and coordinating care related to the above assessment and plan.  Dago Jungwirth A, NP  Please contact Palliative Medicine Team phone at [phone] for questions and concerns.

## 2015-07-22 NOTE — Progress Notes (Signed)
Inpatient Diabetes Program Recommendations  AACE/ADA: New Consensus Statement on Inpatient Glycemic Control (2015)  Target Ranges:  Prepandial:   less than 140 mg/dL      Peak postprandial:   less than 180 mg/dL (1-2 hours)      Critically ill patients:  140 - 180 mg/dL  Results for Marie Holmes, Marie Holmes (MRN [id number]) as of 07/22/2015 09:14  Ref. Range 07/21/2015 07:37 07/21/2015 11:37 07/21/2015 17:02 07/21/2015 20:50 07/22/2015 07:16  Glucose-Capillary Latest Ref Range: 65-99 mg/dL 219 (H) 173 (H) 129 (H) 227 (H) 230 (H)   Review of Glycemic Control  Current orders for Inpatient glycemic control: Levemir 18 units daily, Novolog 0-20 units TID with meals, Glipizide 5 mg BID  Inpatient Diabetes Program Recommendations: Insulin - Basal: Fasting glucose 230 mg/dl this morning. Please consider increasing Levemir to 20 units daily. Correction (SSI): Please consider ordering Novolog bedtime correction scale.  Thanks, Barnie Alderman, RN, MSN, CDE Diabetes Coordinator Inpatient Diabetes Program [phone] (Team Pager from [place] to [place]) [phone] ([facility] office) [phone] [facility] office) [phone] [facility] office)

## 2015-07-23 DIAGNOSIS — Z515 Encounter for palliative care: Secondary | ICD-10-CM | POA: Diagnosis not present

## 2015-07-23 DIAGNOSIS — E119 Type 2 diabetes mellitus without complications: Secondary | ICD-10-CM | POA: Diagnosis not present

## 2015-07-23 DIAGNOSIS — J441 Chronic obstructive pulmonary disease with (acute) exacerbation: Secondary | ICD-10-CM | POA: Diagnosis not present

## 2015-07-23 DIAGNOSIS — Z7189 Other specified counseling: Secondary | ICD-10-CM | POA: Diagnosis not present

## 2015-07-23 DIAGNOSIS — Z720 Tobacco use: Secondary | ICD-10-CM

## 2015-07-23 DIAGNOSIS — F418 Other specified anxiety disorders: Secondary | ICD-10-CM | POA: Diagnosis not present

## 2015-07-23 DIAGNOSIS — I10 Essential (primary) hypertension: Secondary | ICD-10-CM | POA: Diagnosis not present

## 2015-07-23 DIAGNOSIS — R627 Adult failure to thrive: Secondary | ICD-10-CM | POA: Diagnosis not present

## 2015-07-23 LAB — BASIC METABOLIC PANEL
ANION GAP: 5 (ref 5–15)
BUN: 27 mg/dL — ABNORMAL HIGH (ref 6–20)
CALCIUM: 8.8 mg/dL — AB (ref 8.9–10.3)
CHLORIDE: 103 mmol/L (ref 101–111)
CO2: 28 mmol/L (ref 22–32)
Creatinine, Ser: 0.7 mg/dL (ref 0.44–1.00)
GFR calc non Af Amer: >60 mL/min (ref >60)
GLUCOSE: 198 mg/dL — AB (ref 65–99)
POTASSIUM: 3.8 mmol/L (ref 3.5–5.1)
Sodium: 136 mmol/L (ref 135–145)

## 2015-07-23 LAB — GLUCOSE, CAPILLARY
GLUCOSE-CAPILLARY: 261 mg/dL — AB (ref 65–99)
GLUCOSE-CAPILLARY: 164 mg/dL — AB (ref 65–99)
GLUCOSE-CAPILLARY: 195 mg/dL — AB (ref 65–99)
Glucose-Capillary: 195 mg/dL — ABNORMAL HIGH (ref 65–99)

## 2015-07-23 MED ORDER — HYDROCODONE-HOMATROPINE 5-1.5 MG/5ML PO SYRP: 5.0000 mL | ORAL_SOLUTION | ORAL | Status: DC | PRN

## 2015-07-23 MED ORDER — MORPHINE SULFATE (PF) 2 MG/ML IV SOLN
2.0000 mg | INTRAVENOUS | Status: DC | PRN
  Administered 2015-07-23 – 2015-07-24 (×7): 2 mg via INTRAVENOUS
  Filled 2015-07-23 (×6): qty 1

## 2015-07-23 MED ORDER — FUROSEMIDE 10 MG/ML IJ SOLN
40.0000 mg | Freq: Once | INTRAMUSCULAR | Status: AC
  Administered 2015-07-23: 40 mg via INTRAVENOUS
  Filled 2015-07-23: qty 4

## 2015-07-23 MED ORDER — DIPHENHYDRAMINE HCL 25 MG PO CAPS
25.0000 mg | ORAL_CAPSULE | Freq: Four times a day (QID) | ORAL | Status: DC | PRN
  Administered 2015-07-23 – 2015-07-24 (×2): 25 mg via ORAL
  Filled 2015-07-23 (×2): qty 1

## 2015-07-23 MED ORDER — LEVOFLOXACIN 750 MG PO TABS: 750.0000 mg | ORAL_TABLET | Freq: Every day | ORAL | Status: DC

## 2015-07-23 NOTE — Progress Notes (Addendum)
PROGRESS NOTE    Marie Holmes  NLZ:[id number] DOB: [birth year] DOA: 07/19/2015 PCP: Arnette Norris, MD   Brief Narrative:  35 yof with history of DM, HTN, and COPD presented with complaints of worsening SOB, chest pain, and productive cough with greenish sputum. She also complained of associated chills and sweats. At baseline she is reported to have DOE. In the ED, CXR revealed possible vascular congestion. She was referred for admission.    Assessment & Plan:   Principal Problem:   COPD with acute exacerbation ([place]) Active Problems:   Essential hypertension   Tobacco abuse   Tremor   Diabetes mellitus, insulin dependent (IDDM), controlled (HCC)   Dyspnea   FTT (failure to thrive) in adult   Depression with anxiety   Acute exacerbation of chronic obstructive pulmonary disease (COPD) ([place])   Palliative care encounter   Goals of care, counseling/discussion   DNR (do not resuscitate) discussion  1. Acute on chronic respiratory failure, secondary to COPD exacerbation. Continue supplemental [place]. Wean as tolerated.  2. COPD exacerbation, still short of breath and wheezing. Continue on nebs, steroids. She has completed a course of antibiotics. Encourage pulmonary hygiene 3. DM Type 2 Continue insulin and continue levemir. Blood sugars have been stable 4. Essential HTN, stable. Continue home medications.  5. Tobacco use. Counseled on cessation. Continue nicotine patch.  6. FTT, Possibly related to advanced respiratory illness. Pt is DOE at baseline. PMT consulted. Pt wished for limited code status:request intubation but no CPR.  7. Depression/Anxiety, continue medications.  8. Tremor. Continue to monitor.  9. History of non-hodgkin's lymphoma. Followed by oncology    DVT prophylaxis: Lovenox  Code Status: Limited: Request intubation but no CPR.  Family Communication: discussed with patient.  Disposition Plan: Anticipate discharge in 24-48 hours.    Consultants:   Palliative Care    Procedures:   None   Antimicrobials:   Levaquin 5/28>>6/1   Subjective: Continues to have cough that is non productive. Still feels short of breath. Reports some improvement in breathing with morphine.   Objective: Filed Vitals:   07/22/15 2129 07/23/15 0318 07/23/15 0508 07/23/15 0743  BP: 164/62  149/64   Pulse: 92  85   Temp: 97.5 F (36.4 C)  97.6 F (36.4 C)   TempSrc: Oral  Oral   Resp: 20  20   Height:      Weight:   58.06 kg (128 lb)   SpO2: 97% 98% 98% 99%    Intake/Output Summary (Last 24 hours) at 07/23/15 0755 Last data filed at 07/22/15 2130  Gross per 24 hour  Intake    720 ml  Output    750 ml  Net    -30 ml   Filed Weights   07/21/15 0613 07/22/15 0502 07/23/15 0508  Weight: 59.739 kg (131 lb 11.2 oz) 60.873 kg (134 lb 3.2 oz) 58.06 kg (128 lb)    Examination:  General exam: Appears calm and comfortable  Respiratory system: bilateral wheezes and rhonchi. Respiratory effort normal. Cardiovascular system: S1 & S2 heard, RRR. No JVD, murmurs, rubs, gallops or clicks. No pedal edema. Gastrointestinal system:Abdomen is nondistended, soft and tender in the RLQ. No organomegaly or masses felt. Normal bowel sounds heard. Psychiatry: Judgement and insight appear normal. Mood & affect appropriate.     Data Reviewed: I have personally reviewed following labs and imaging studies  CBC:  Recent Labs Lab 07/19/15 2010  WBC 10.1  NEUTROABS 7.3  HGB 14.9  HCT 43.7  MCV  91.4  PLT 132   Basic Metabolic Panel:  Recent Labs Lab 07/19/15 2010 07/21/15 0702 07/23/15 0625  NA 134* 137 136  K 4.0 4.1 3.8  CL 100* 106 103  CO2 '25 26 28  '$ GLUCOSE 170* 217* 198*  BUN 14 26* 27*  CREATININE 0.80 0.93 0.70  CALCIUM 9.1 8.6* 8.8*   Cardiac Enzymes:  Recent Labs Lab 07/19/15 2010  TROPONINI 0.03   CBG:  Recent Labs Lab 07/22/15 0716 07/22/15 1118 07/22/15 1645 07/22/15 2020 07/23/15 0721  GLUCAP 230* 188* 201* 195* 195*  Urine  analysis:    Component Value Date/Time   COLORURINE YELLOW 07/20/2015 0530   APPEARANCEUR HAZY* 07/20/2015 0530   LABSPEC 1.020 07/20/2015 0530   PHURINE 5.5 07/20/2015 0530   GLUCOSEU >1000* 07/20/2015 0530   HGBUR NEGATIVE 07/20/2015 0530   BILIRUBINUR NEGATIVE 07/20/2015 0530   BILIRUBINUR neg 03/05/2015 0944   KETONESUR NEGATIVE 07/20/2015 0530   PROTEINUR NEGATIVE 07/20/2015 0530   PROTEINUR neg 03/05/2015 0944   UROBILINOGEN 0.2 03/05/2015 0944   UROBILINOGEN 0.2 10/25/2014 1839   NITRITE NEGATIVE 07/20/2015 0530   NITRITE neg 03/05/2015 0944   LEUKOCYTESUR NEGATIVE 07/20/2015 0530   Sepsis Labs: '@LABRCNTIP'$ (procalcitonin:4,lacticidven:4)  )No results found for this or any previous visit (from the past 240 hour(s)).       Radiology Studies: No results found.      Scheduled Meds: . aspirin EC  81 mg Oral q morning - 10a  . citalopram  20 mg Oral Daily  . docusate sodium  100 mg Oral BID  . enoxaparin (LOVENOX) injection  40 mg Subcutaneous Q24H  . glipiZIDE  5 mg Oral BID  . guaiFENesin  1,200 mg Oral BID  . insulin aspart  0-20 Units Subcutaneous TID WC  . insulin detemir  18 Units Subcutaneous Daily  . ipratropium-albuterol  3 mL Nebulization Q6H  . levofloxacin (LEVAQUIN) IV  750 mg Intravenous Q24H  . losartan  50 mg Oral Daily  . methylPREDNISolone (SOLU-MEDROL) injection  80 mg Intravenous Q8H  . metoprolol succinate  50 mg Oral Daily  . nicotine  14 mg Transdermal Daily  . sodium chloride flush  3 mL Intravenous Q12H   Continuous Infusions:    LOS: 4 days    Time spent: 25 minutes.     Kathie Dike, MD [facility] Pager [phone]  If 7PM-7AM, please contact night-coverage [email or web address] Password [email or web address] 07/23/2015, 7:55 AM

## 2015-07-23 NOTE — Evaluation (Signed)
Physical Therapy Evaluation Patient Details Name: Marie Holmes MRN: [id number] DOB: Aug 17, 1935 Today's Date: 07/23/2015   History of Present Illness  80 yo F admitted with increasing SOB, chest pain, and cough with COPD exacerbation.  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 difficile colitis, Palpitations, Tremor, Chronic abdominal pain, Appendectomy, Exploratory laparotomy for ? adhesions, Bone marrow biopsy, Lung core biopsy, Breast surgery, Cholecystectomy, Mass excision right thigh    Clinical Impression  Pt received in bed, and was agreeable to PT evaluation.  Pt lives with her dtr, and states that she normally uses a cane for ambulation, and she is independent with dressing/bathing, although it takes her a long time.  Today she ambulated 2f with Min guard, and was limited due to SOB and fatigue, however SpO2 was 93% on 2L.  Pt would benefit from continued HHPT upon d/c home to progress with strength, balance, and endurance.  Pt may also benefit from a rollator walker to assist with endurance.      Follow Up Recommendations Home health PT    Equipment Recommendations  Other (comment) (Rollator walker (4 wheeled walker with a seat))    Recommendations for Other Services       Precautions / Restrictions Restrictions Weight Bearing Restrictions: No      Mobility  Bed Mobility Overal bed mobility: Modified Independent                Transfers Overall transfer level: Modified independent Equipment used: None                Ambulation/Gait Ambulation/Gait assistance:  Min guard Ambulation Distance (Feet): 60 Feet Assistive device: None Gait Pattern/deviations: Step-to pattern;Decreased stride length     General Gait Details: Pt holding to the wall rail during ambulation.  Distance limited due to SOB. Upon return to pt's room, SpO2 was 93% on 2L  Stairs            Wheelchair Mobility    Modified Rankin (Stroke Patients Only)       Balance Overall balance assessment: Needs assistance         Standing balance support: Single extremity supported Standing balance-Leahy Scale: Fair                               Pertinent Vitals/Pain Pain Assessment: 0-10 Pain Score: 8  Pain Location: R side and back Pain Descriptors / Indicators: Aching Pain Intervention(s): Limited activity within patient's tolerance;Repositioned    Home Living   Living Arrangements: Children (Dtr - works during the day. ) Available Help at Discharge: Family Type of Home: House Home Access: Stairs to enter   [employer] Steps: 4 in the front with no HR Home Layout: One level Home Equipment: Cane - single point      Prior Function Level of Independence: Independent with assistive device(s)         Comments: Pt states that she uses a cane all the time.  Pt states that the only errands she runs is to the grocery store and to church.       Hand Dominance        Extremity/Trunk Assessment  Upper Extremity Assessment: Generalized weakness           Lower Extremity Assessment: Generalized weakness         Communication   Communication: No difficulties  Cognition Arousal/Alertness: Awake/alert Behavior During Therapy: WFL for tasks assessed/performed Overall Cognitive Status: Within Functional Limits for tasks assessed                      General Comments      Exercises        Assessment/Plan    PT Assessment Patient needs continued PT services  PT Diagnosis Difficulty walking   PT Problem  List Decreased strength;Decreased activity tolerance;Decreased balance;Decreased mobility;Decreased knowledge of use of DME;Cardiopulmonary status limiting activity  PT Treatment Interventions Gait training;Functional mobility training;Therapeutic activities;Therapeutic exercise;Balance training;Patient/family education   PT Goals (Current goals can be found in the Care Plan section) Acute Rehab PT Goals Patient Stated Goal: Pt wants to go home.  PT Goal Formulation: With patient Time For Goal Achievement: 07/30/15 Potential to Achieve Goals: Good    Frequency Min 3X/week   Barriers to discharge        Co-evaluation               End of Session Equipment Utilized During Treatment: Gait belt;Oxygen Activity Tolerance: Patient limited by fatigue Patient left: in chair;with call bell/phone within reach Nurse Communication: Mobility status    Functional Assessment Tool Used: [place] "6-clicks"  Functional Limitation: Mobility: Walking and moving around Mobility: Walking and Moving Around Current Status [phone]): At least 20 percent but less than 40 percent impaired, limited or restricted Mobility: Walking and Moving Around Goal Status [phone]): At least 1 percent but less than 20 percent impaired, limited or restricted    Time: 3794-3276 PT Time Calculation (min) (ACUTE ONLY): 22 min   Charges:         PT G Codes:   PT G-Codes **NOT FOR INPATIENT CLASS** Functional Assessment Tool Used: [employer] "6-clicks"  Functional Limitation: Mobility: Walking and moving around Mobility: Walking and Moving Around Current Status [phone]): At least 20 percent but less than 40 percent impaired, limited or restricted Mobility: Walking and Moving Around Goal Status [phone]): At least 1 percent but less than 20 percent impaired, limited or restricted    Tacy Learn, PT, DPT X: 7340   07/23/2015, 7:35 PM

## 2015-07-23 NOTE — Progress Notes (Signed)
Inpatient Diabetes Program Recommendations  AACE/ADA: New Consensus Statement on Inpatient Glycemic Control (2015)  Target Ranges:  Prepandial:   less than 140 mg/dL      Peak postprandial:   less than 180 mg/dL (1-2 hours)      Critically ill patients:  140 - 180 mg/dL  Results for Marie Holmes, Marie Holmes (MRN [id number]) as of 07/23/2015 11:04  Ref. Range 07/22/2015 07:16 07/22/2015 11:18 07/22/2015 16:45 07/22/2015 20:20 07/23/2015 07:21  Glucose-Capillary Latest Ref Range: 65-99 mg/dL 230 (H) 188 (H) 201 (H) 195 (H) 195 (H)   Review of Glycemic Control  Current orders for Inpatient glycemic control: Levemir 18 units daily, Novolog 0-20 units TID with meals, Glipizide 5 mg BID  Inpatient Diabetes Program Recommendations: Insulin - Basal: Fasting glucose 195 mg/dl this morning. Please consider increasing Levemir to 20 units daily. Correction (SSI): Please consider ordering Novolog bedtime correction scale.  Thanks, Barnie Alderman, RN, MSN, CDE Diabetes Coordinator Inpatient Diabetes Program [phone] (Team Pager from [place] to [place]) [phone] ([facility] office) [phone] [facility] office) [phone] [facility] office)

## 2015-07-23 NOTE — Progress Notes (Signed)
Daily Progress Note   Patient Name: Marie Holmes       Date: 07/23/2015 DOB: [birth year]  Age: 80 y.o. MRN#: [id number] Attending Physician: Kathie Dike, MD Primary Care Physician: Arnette Norris, MD Admit Date: 07/19/2015  Reason for Consultation/Follow-up: Disposition, Establishing goals of care and Psychosocial/spiritual support  Subjective: Marie Holmes is in the restroom toileting as I enter. She is able to provide self-care after toileting, and walk unaided to the sink to wash her hands.  She makes and keeps eye contact.  Her daughter [name] at beside.  We talk about her mobility, and physical therapy evaluations.  We talk about home health service.  [name] states her preference is to try to manage on her own. I share my worry that she may worsen and having a home health nurse to monitor her may make the difference in her returning to the hospital or not.    We talk about her making an appointment with her primary care doctor, [name], after discharge. I encouraged her to talk with him about managing her blood sugar and her pulmonary disease.  Both she and [name] state that he is able to manage both.  We talk about realistic goals for her A1 C and blood sugar levels.  We also talk about pulmonary function testing, and the possibility that she will need to increase her pulmonary medication regimen. She asks about her continued problems with breathing, and I share that every time she has a COPD exacerbation, there is more scarring, and therefore more difficulty breathing.  We again discussed the chronic illness trajectory.  Marie Holmes states she would like for the hospitalist to write prescriptions for her glipizide and morphine when she leaves, stating that she will be able to see Dr. [place] Crane   for prescriptions.  Mr. Virgia Land and [name] both again, expressed concern over the decrease dose of Xanax to 0.25 mg TID PRN ( from previous PCP).  I share that some physicians are reluctant to prescribe morphine and increased benzodiazepines for people with COPD based on GOLD Guidelines.   We talk about the benefits of hospice in relation to symptom management, Marie Holmes and [name] are both open to hospice, "Sooner rather than later".    Length of Stay: 4  Current Medications: Scheduled Meds:  . aspirin EC  81 mg Oral q morning - 10a  . citalopram  20 mg Oral Daily  . docusate sodium  100 mg Oral BID  . enoxaparin (LOVENOX) injection  40 mg Subcutaneous Q24H  . glipiZIDE  5 mg Oral BID  . guaiFENesin  1,200 mg Oral BID  . insulin aspart  0-20 Units Subcutaneous TID WC  . insulin detemir  18 Units Subcutaneous Daily  . ipratropium-albuterol  3 mL Nebulization Q6H  . levofloxacin  750 mg Oral QHS  . losartan  50 mg Oral Daily  . methylPREDNISolone (SOLU-MEDROL) injection  80 mg Intravenous Q8H  . metoprolol succinate  50 mg Oral Daily  . nicotine  14 mg Transdermal Daily  . sodium chloride flush  3 mL Intravenous Q12H    Continuous Infusions:    PRN Meds: acetaminophen **OR** acetaminophen, albuterol, ALPRAZolam, benzonatate, bisacodyl, morphine injection, morphine injection, ondansetron **OR** ondansetron (ZOFRAN) IV, polyethylene glycol  Physical Exam  Constitutional: She is oriented to person, place, and time. No distress.  HENT:  Head: Normocephalic and atraumatic.  Cardiovascular: Normal rate.   Abdominal: Soft. There is no guarding.  Abdomen reduced from yesterday, still slightly distended.   Neurological: She is alert and oriented to person, place, and time.  Skin: Skin is warm and dry.  Psychiatric: She has a normal mood and affect. Her behavior is normal.  Nursing note and vitals reviewed.           Vital Signs: BP 149/64 mmHg  Pulse 85  Temp(Src) 97.6 F (36.4  C) (Oral)  Resp 20  Ht 5' (1.524 m)  Wt 58.06 kg (128 lb)  BMI 25.00 kg/m2  SpO2 99% SpO2: SpO2: 99 % O2 Device: O2 Device: Nasal Cannula O2 Flow Rate: O2 Flow Rate (L/min): 2 L/min  Intake/output summary:  Intake/Output Summary (Last 24 hours) at 07/23/15 1336 Last data filed at 07/23/15 0900  Gross per 24 hour  Intake    480 ml  Output    500 ml  Net    -20 ml   LBM: Last BM Date: 07/22/15 Baseline Weight: Weight: 58.469 kg (128 lb 14.4 oz) Most recent weight: Weight: 58.06 kg (128 lb)       Palliative Assessment/Data:    Flowsheet Rows        Most Recent Value   Intake Tab    Referral Department  Hospitalist   Unit at Time of Referral  Med/Surg Unit   Palliative Care Primary Diagnosis  Pulmonary   Date Notified  07/19/15   Palliative Care Type  New Palliative care   Reason for referral  Clarify Goals of Care, Psychosocial or Spiritual support   Date of Admission  07/19/15   Date first seen by Palliative Care  07/20/15   # of days Palliative referral response time  1 Day(s)   # of days IP prior to Palliative referral  0   Clinical Assessment    Palliative Performance Scale Score  40%   Pain Max last 24 hours  Not able to report   Pain Min Last 24 hours  Not able to report   Dyspnea Max Last 24 Hours  Not able to report   Dyspnea Min Last 24 hours  Not able to report   Psychosocial & Spiritual Assessment    Palliative Care Outcomes    Patient/Family meeting held?  Yes   Who was at the meeting?  Family meeting held with patient and daughter [name] at bedside   Patient/Family wishes: Interventions discontinued/not  started   Mechanical Ventilation   Palliative Care follow-up planned  -- [Follow up while at [facility]]      Patient Active Problem List   Diagnosis Date Noted  . DNR (do not resuscitate) discussion   . Palliative care encounter   . Goals of care, counseling/discussion   . Dyspnea 07/19/2015  . FTT (failure to thrive) in adult 07/19/2015  . Depression  with anxiety 07/19/2015  . Acute exacerbation of chronic obstructive pulmonary disease (COPD) ([place]) 07/19/2015  . Angiolipoma 05/04/2015  . Medicare annual wellness visit, subsequent 03/05/2015  . Leg mass 03/05/2015  . Diabetes mellitus, insulin dependent (IDDM), controlled ([place]) 12/23/2014  . Palpitations 09/15/2014  . Loss of weight 07/22/2014  . Tremor 07/22/2014  . Hematuria 07/01/2014  . Chronic anxiety 06/21/2014  . Skin lesions 02/27/2014  . Tobacco abuse 12/26/2013  . Chronic cholecystitis with calculus 12/11/2013  . Nausea alone 11/08/2013  . Gall bladder pain 11/08/2013  . Chest pain 10/30/2013  . COPD with acute exacerbation ([place]) 10/27/2013  . Bowel incontinence 08/12/2013  . Dysuria 08/12/2013  . Lymphadenitis 06/19/2013  . Back pain 04/11/2013  . Urinary frequency 02/27/2013  . Non Hodgkin's lymphoma ([place]) 11/28/2012  . COLONIC POLYPS 09/24/2008  . HLD (hyperlipidemia) 05/11/2006  . Essential hypertension 05/11/2006  . Venous (peripheral) insufficiency 05/11/2006  . COPD 05/11/2006  . GASTROESOPHAGEAL REFLUX, NO ESOPHAGITIS 04/20/2006  . IRRITABLE BOWEL SYNDROME 04/20/2006  . OSTEOARTHRITIS, MULTI SITES 04/20/2006  . Osteoporosis, unspecified 04/20/2006  . INSOMNIA NOS 04/20/2006  . INCONTINENCE, URGE 04/20/2006    Palliative Care Assessment & Plan   Patient Profile: Ms. Jech is a 80 year old female with a history of diabetes, hypertension, COPD, lung cancer with radiation treatment, anxiety and depression, GERD, diverticulosis, colon polyps, and C. difficile colitis in April 2016. She lives with her daughter [name], and is independent with ADLs for the most part. She no longer drives. And relies on her daughter to get groceries and take her to appointments. She has had one hospitalization in the last 6 months, and shares that she has been seeing a new primary care provider, [name], and is scheduled for full physical in July this year.    Assessment: as above  Recommendations/Plan:  continue to treat the treatable, code status as discussed, no chest compressions. We discussed home health services as needed. We discussed the benefits of hospice when the time is right.  Goals of Care and Additional Recommendations:  Limitations on Scope of Treatment: No chest compressions, intubation for limited amount of time.  Code Status:    Code Status Orders        Start     Ordered   07/22/15 1422  Limited resuscitation (code)   Continuous    Comments:  Mrs. Virgia Land states her desire is for a trial period of intubation, no more than 4 days, if she were to have respiratory dysfunction only. No intubation for cardiovascular events.  If she is unable to to wean off of the ventilator, she would prefer to be extubated regardless of outcome, let nature take its course.  Mrs. Schuman also states she would not want chest compressions under any circumstance due to her frail bones.   NO PEG tube.  Question Answer Comment  In the event of cardiac or respiratory ARREST: Initiate Code Blue, Call Rapid Response No   In the event of cardiac or respiratory ARREST: Perform CPR No   In the event of cardiac or respiratory ARREST: Perform Intubation/Mechanical Ventilation  Yes   In the event of cardiac or respiratory ARREST: Use NIPPV/BiPAp only if indicated Yes   In the event of cardiac or respiratory ARREST: Administer ACLS medications if indicated Yes   In the event of cardiac or respiratory ARREST: Perform Defibrillation or Cardioversion if indicated No      07/22/15 1422    Code Status History    Date Active Date Inactive Code Status Order ID Comments User Context   07/21/2015 12:26 PM 07/22/2015  2:22 PM Partial Code [id number] Mrs. Virgia Land states her desire is for a trial period of intubation, no more than 10 days, if she were to have respiratory dysfunction only. No intubation for cardiovascular events.  If she is unable to to wean off of  the ventilator, she would prefer to be extubated regardless of outcome, let nature take its course.  Mrs. Tidmore also states she would not want chest compressions under any circumstance due to her frail bones. Drue Novel, NP Inpatient   07/20/2015  1:33 PM 07/21/2015 12:26 PM Partial Code [id number]  Donne Hazel, MD Inpatient   07/19/2015 11:16 PM 07/20/2015  1:33 PM Full Code [id number]  Roney Jaffe, MD Inpatient   12/24/2014  3:53 PM 12/25/2014  5:42 PM Full Code [id number]  Erline Hau, MD Inpatient   06/20/2014  6:25 PM 06/24/2014  8:43 PM Full Code [id number]  Doree Albee, MD ED   12/11/2013  7:02 PM 12/12/2013 12:57 PM Full Code [id number]  Donnie Mesa, MD Inpatient   10/30/2013  2:01 PM 11/02/2013 11:08 PM Partial Code [id number]  Kathie Dike, MD Inpatient   10/27/2013  9:45 PM 10/28/2013  5:57 PM Partial Code [id number]  Kathie Dike, MD ED    Advance Directive Documentation        Most Recent Value   Type of Advance Directive  Living will   Pre-existing out of facility DNR order (yellow form or pink MOST form)     "MOST" Form in Place?         Prognosis:   Unable to determine, Based on outcomes including COPD exacerbations, functional status.  Discharge Planning:  We discuss going home with home health today. Ms. Houlton is somewhat reluctant stating she wants to try on her own.  I share my worry about her worsening at home, and the benefits of having a Registered nurse monitor her for a few weeks.  Care plan was discussed with Nursing staff, case manager, social worker, and Dr. Roderic Palau.  Thank you for allowing the Palliative Medicine Team to assist in the care of this patient.   Time In: 1145 Time Out: 1220 Total Time 35 minutes Prolonged Time Billed  no       Greater than 50%  of this time was spent counseling and coordinating care related to the above assessment and plan.  Dove,Tasha A, NP  Please contact Palliative Medicine Team phone at [phone] for  questions and concerns.

## 2015-07-23 NOTE — Progress Notes (Signed)
PHARMACIST - PHYSICIAN COMMUNICATION DR:   Roderic Palau CONCERNING: Antibiotic IV to Oral Route Change Policy  RECOMMENDATION: This patient is receiving LEVAQUIN by the intravenous route.  Based on criteria approved by the Pharmacy and Therapeutics Committee, the antibiotic(s) is/are being converted to the equivalent oral dose form(s).   DESCRIPTION: These criteria include:  Patient being treated for a respiratory tract infection, urinary tract infection, cellulitis or clostridium difficile associated diarrhea if on metronidazole  The patient is not neutropenic and does not exhibit a GI malabsorption state  The patient is eating (either orally or via tube) and/or has been taking other orally administered medications for a least 24 hours  The patient is improving clinically and has a Tmax < 100.5  If you have questions about this conversion, please contact the Pharmacy Department  '[x]'$   [phone] )  [facility] '[]'$   [phone] )  [facility] '[]'$   [phone] )  [facility] '[]'$   [phone] )  [facility] '[]'$   [phone] )  [place], PharmD Clinical Pharmacist Pager:  [phone] 07/23/2015 1:22 PM

## 2015-07-23 NOTE — Care Management Note (Signed)
Case Management Note  Patient Details  Name: Marie Holmes MRN: [id number] Date of Birth: 27-May-1935  Subjective/Objective:    Follow up [place] visit. [name] and O from home with adult  Daughter , patient drives self.  PCP is Marie Holmes, Denies issues with filling medications. No Home O2 . Thinks she has used [facility] in the past. Uses Cane to ambulate. Has nebulizer.   Not homebound. Patient may need home O2  Action/Plan:Home with self care.   Expected Discharge Date:                  Expected Discharge Plan:  Home/Self Care  In-House Referral:     Discharge planning Services  CM Consult  Post Acute Care Choice:    Choice offered to:     DME Arranged:    DME Agency:     HH Arranged:    HH Agency:     Status of Service:  In process, will continue to follow  Medicare Important Message Given:    Date Medicare IM Given:    Medicare IM give by:    Date Additional Medicare IM Given:    Additional Medicare Important Message give by:     If discussed at [place] of Stay Meetings, dates discussed:    Additional Comments:  Marie Heidelberg, RN 07/23/2015, 3:20 PM

## 2015-07-24 ENCOUNTER — Telehealth: Payer: Self-pay | Admitting: Family Medicine

## 2015-07-24 DIAGNOSIS — F418 Other specified anxiety disorders: Secondary | ICD-10-CM | POA: Diagnosis not present

## 2015-07-24 DIAGNOSIS — Z515 Encounter for palliative care: Secondary | ICD-10-CM | POA: Diagnosis not present

## 2015-07-24 DIAGNOSIS — I10 Essential (primary) hypertension: Secondary | ICD-10-CM | POA: Diagnosis not present

## 2015-07-24 DIAGNOSIS — R627 Adult failure to thrive: Secondary | ICD-10-CM | POA: Diagnosis not present

## 2015-07-24 DIAGNOSIS — Z720 Tobacco use: Secondary | ICD-10-CM | POA: Diagnosis not present

## 2015-07-24 DIAGNOSIS — Z7189 Other specified counseling: Secondary | ICD-10-CM | POA: Diagnosis not present

## 2015-07-24 DIAGNOSIS — E119 Type 2 diabetes mellitus without complications: Secondary | ICD-10-CM | POA: Diagnosis not present

## 2015-07-24 DIAGNOSIS — J441 Chronic obstructive pulmonary disease with (acute) exacerbation: Secondary | ICD-10-CM | POA: Diagnosis not present

## 2015-07-24 LAB — BASIC METABOLIC PANEL
ANION GAP: 7 (ref 5–15)
BUN: 27 mg/dL — ABNORMAL HIGH (ref 6–20)
CALCIUM: 9 mg/dL (ref 8.9–10.3)
CO2: 30 mmol/L (ref 22–32)
Chloride: 100 mmol/L — ABNORMAL LOW (ref 101–111)
Creatinine, Ser: 0.82 mg/dL (ref 0.44–1.00)
GLUCOSE: 224 mg/dL — AB (ref 65–99)
Potassium: 4 mmol/L (ref 3.5–5.1)
Sodium: 137 mmol/L (ref 135–145)

## 2015-07-24 LAB — GLUCOSE, CAPILLARY
GLUCOSE-CAPILLARY: 104 mg/dL — AB (ref 65–99)
Glucose-Capillary: 204 mg/dL — ABNORMAL HIGH (ref 65–99)
Glucose-Capillary: 260 mg/dL — ABNORMAL HIGH (ref 65–99)
Glucose-Capillary: 331 mg/dL — ABNORMAL HIGH (ref 65–99)

## 2015-07-24 MED ORDER — MAGNESIUM CITRATE PO SOLN
1.0000 | Freq: Once | ORAL | Status: AC
  Administered 2015-07-24: 1 via ORAL
  Filled 2015-07-24: qty 296

## 2015-07-24 MED ORDER — PHENAZOPYRIDINE HCL 100 MG PO TABS
100.0000 mg | ORAL_TABLET | Freq: Three times a day (TID) | ORAL | Status: DC
  Administered 2015-07-24 – 2015-07-25 (×3): 100 mg via ORAL
  Filled 2015-07-24 (×3): qty 1

## 2015-07-24 MED ORDER — DOCUSATE SODIUM 100 MG PO CAPS
200.0000 mg | ORAL_CAPSULE | Freq: Two times a day (BID) | ORAL | Status: DC
  Administered 2015-07-24 – 2015-07-25 (×2): 200 mg via ORAL
  Filled 2015-07-24 (×2): qty 2

## 2015-07-24 NOTE — Care Management Important Message (Signed)
Important Message  Patient Details  Name: Marie Holmes MRN: [id number] Date of Birth: 03/10/1935   Medicare Important Message Given:  Yes    Alvie Heidelberg, RN 07/24/2015, 9:11 AM

## 2015-07-24 NOTE — Care Management (Signed)
Continued assessment. Spoke with patient again. PT recommended HH. Patietn stated that she was "not interested in this at this time. Patietn does not qualify for Home O2. Lives with daughter.. Continue to follow

## 2015-07-24 NOTE — Progress Notes (Signed)
SATURATION QUALIFICATIONS: (This note is used to comply with regulatory documentation for home oxygen)  Patient Saturations on Room Air at Rest = 99%  Patient Saturations on Room Air while Ambulating = 94%  Patient Saturations on 0 Liters of oxygen while Ambulating = 94%  Please briefly explain why patient needs home oxygen:

## 2015-07-24 NOTE — Telephone Encounter (Signed)
Pt daughter walked in and wanted PCP to know that mother (pt) was in the hospital and "it didn't look good".  Best number to call is [phone]

## 2015-07-24 NOTE — Progress Notes (Signed)
Daily Progress Note   Patient Name: Marie Holmes       Date: 07/24/2015 DOB: February 27, 1935  Age: 80 y.o. MRN#: [id number] Attending Physician: Kathie Dike, MD Primary Care Physician: Arnette Norris, MD Admit Date: 07/19/2015  Reason for Consultation/Follow-up: Disposition, Establishing goals of care, Non pain symptom management and Psychosocial/spiritual support  Subjective: Marie Holmes is sitting up in bed, her daughter Marie Holmes at bedside. She makes eye contact and greets me as I enter.  She states that she does not want to go to rehab.  We talk about physical therapy eval and their recommendations that she have home health PT.  She states that she has been having problems having a bowel movement and will try MiraLAX today (I increase her Colace). We talk about having testing to see if she needs oxygen at home, having nursing staff check her oxygen levels on room air, and walking for 6 minutes.    She talks about slight distention in her abdomen, and I share this is likely where she holds fluid when she has a heart failure overload. I encouraged her and daughter Marie Holmes to talk with primary care physician regarding the use of PRN Lasix. Marie Holmes continues to talk about getting prescriptions before leaving. Including glipizide, steroids,and morphine. We talk again today about COPD GOLD standards, and the use of narcotics and benzodiazapine's.     Length of Stay: 5  Current Medications: Scheduled Meds:  . aspirin EC  81 mg Oral q morning - 10a  . citalopram  20 mg Oral Daily  . docusate sodium  200 mg Oral BID  . enoxaparin (LOVENOX) injection  40 mg Subcutaneous Q24H  . glipiZIDE  5 mg Oral BID  . guaiFENesin  1,200 mg Oral BID  . insulin aspart  0-20 Units Subcutaneous TID WC  . insulin  detemir  18 Units Subcutaneous Daily  . ipratropium-albuterol  3 mL Nebulization Q6H  . losartan  50 mg Oral Daily  . methylPREDNISolone (SOLU-MEDROL) injection  80 mg Intravenous Q8H  . metoprolol succinate  50 mg Oral Daily  . nicotine  14 mg Transdermal Daily  . sodium chloride flush  3 mL Intravenous Q12H    Continuous Infusions:    PRN Meds: acetaminophen **OR** acetaminophen, albuterol, ALPRAZolam, benzonatate, bisacodyl, diphenhydrAMINE, HYDROcodone-homatropine, morphine injection, ondansetron **OR** ondansetron (ZOFRAN)  IV, polyethylene glycol  Physical Exam  Constitutional: She is oriented to person, place, and time. No distress.  HENT:  Head: Normocephalic and atraumatic.  Cardiovascular: Normal rate and regular rhythm.   Pulmonary/Chest: Effort normal. No respiratory distress.  Abdominal: Soft.  Mild distention  Neurological: She is alert and oriented to person, place, and time.  Skin: Skin is warm and dry.  Psychiatric: She has a normal mood and affect.  Nursing note and vitals reviewed.           Vital Signs: BP 150/69 mmHg  Pulse 80  Temp(Src) 97.8 F (36.6 C) (Oral)  Resp 20  Ht 5' (1.524 m)  Wt 57.834 kg (127 lb 8 oz)  BMI 24.90 kg/m2  SpO2 95% SpO2: SpO2: 95 % O2 Device: O2 Device: Nasal Cannula O2 Flow Rate: O2 Flow Rate (L/min): 2 L/min  Intake/output summary:  Intake/Output Summary (Last 24 hours) at 07/24/15 1252 Last data filed at 07/24/15 6962  Gross per 24 hour  Intake    720 ml  Output      0 ml  Net    720 ml   LBM: Last BM Date: 07/22/15 Baseline Weight: Weight: 58.469 kg (128 lb 14.4 oz) Most recent weight: Weight: 57.834 kg (127 lb 8 oz)       Palliative Assessment/Data:    Flowsheet Rows        Most Recent Value   Intake Tab    Referral Department  Hospitalist   Unit at Time of Referral  Med/Surg Unit   Palliative Care Primary Diagnosis  Pulmonary   Date Notified  07/19/15   Palliative Care Type  New Palliative care    Reason for referral  Clarify Goals of Care, Psychosocial or Spiritual support   Date of Admission  07/19/15   Date first seen by Palliative Care  07/20/15   # of days Palliative referral response time  1 Day(s)   # of days IP prior to Palliative referral  0   Clinical Assessment    Palliative Performance Scale Score  40%   Pain Max last 24 hours  Not able to report   Pain Min Last 24 hours  Not able to report   Dyspnea Max Last 24 Hours  Not able to report   Dyspnea Min Last 24 hours  Not able to report   Psychosocial & Spiritual Assessment    Palliative Care Outcomes    Patient/Family meeting held?  Yes   Who was at the meeting?  Family meeting held with patient and daughter Marie Holmes at bedside   Patient/Family wishes: Interventions discontinued/not started   Mechanical Ventilation   Palliative Care follow-up planned  -- [Follow up while at [facility]      Patient Active Problem List   Diagnosis Date Noted  . DNR (do not resuscitate) discussion   . Palliative care encounter   . Goals of care, counseling/discussion   . Dyspnea 07/19/2015  . FTT (failure to thrive) in adult 07/19/2015  . Depression with anxiety 07/19/2015  . Acute exacerbation of chronic obstructive pulmonary disease (COPD) ([place]) 07/19/2015  . Angiolipoma 05/04/2015  . Medicare annual wellness visit, subsequent 03/05/2015  . Leg mass 03/05/2015  . Diabetes mellitus, insulin dependent (IDDM), controlled ([place]) 12/23/2014  . Palpitations 09/15/2014  . Loss of weight 07/22/2014  . Tremor 07/22/2014  . Hematuria 07/01/2014  . Chronic anxiety 06/21/2014  . Skin lesions 02/27/2014  . Tobacco abuse 12/26/2013  . Chronic cholecystitis with calculus 12/11/2013  .  Nausea alone 11/08/2013  . Gall bladder pain 11/08/2013  . Chest pain 10/30/2013  . COPD with acute exacerbation ([place]) 10/27/2013  . Bowel incontinence 08/12/2013  . Dysuria 08/12/2013  . Lymphadenitis 06/19/2013  . Back pain 04/11/2013  . Urinary frequency  02/27/2013  . Non Hodgkin's lymphoma ([place]) 11/28/2012  . COLONIC POLYPS 09/24/2008  . HLD (hyperlipidemia) 05/11/2006  . Essential hypertension 05/11/2006  . Venous (peripheral) insufficiency 05/11/2006  . COPD 05/11/2006  . GASTROESOPHAGEAL REFLUX, NO ESOPHAGITIS 04/20/2006  . IRRITABLE BOWEL SYNDROME 04/20/2006  . OSTEOARTHRITIS, MULTI SITES 04/20/2006  . Osteoporosis, unspecified 04/20/2006  . INSOMNIA NOS 04/20/2006  . INCONTINENCE, URGE 04/20/2006    Palliative Care Assessment & Plan   Patient Profile: Ms. Minish is a 80 year old female with a history of diabetes, hypertension, COPD, lung cancer with radiation treatment, anxiety and depression, GERD, diverticulosis, colon polyps, and C. difficile colitis in April 2016. She lives with her daughter Marie Holmes, and is independent with ADLs for the most part. She no longer drives. And relies on her daughter to get groceries and take her to appointments. She has had one hospitalization in the last 6 months, and shares that she has been seeing a new primary care provider, [name], and is scheduled for full physical in July this year.   Assessment: as above  Recommendations/Plan:  continue to treat the treatable, code status as discussed, no chest compressions. Walking test for home O2 needs, today or tomorrow.  We discussed home health services as needed. We discussed the benefits of hospice when the time is right.  Goals of Care and Additional Recommendations:  Limitations on Scope of Treatment: No chest compressions, intubation for limited amount of time.  Code Status:    Code Status Orders        Start     Ordered   07/22/15 1422  Limited resuscitation (code)   Continuous    Comments:  Mrs. Virgia Land states her desire is for a trial period of intubation, no more than 4 days, if she were to have respiratory dysfunction only. No intubation for cardiovascular events.  If she is unable to to wean off of the ventilator, she  would prefer to be extubated regardless of outcome, let nature take its course.  Mrs. Duce also states she would not want chest compressions under any circumstance due to her frail bones.   NO PEG tube.  Question Answer Comment  In the event of cardiac or respiratory ARREST: Initiate Code Blue, Call Rapid Response No   In the event of cardiac or respiratory ARREST: Perform CPR No   In the event of cardiac or respiratory ARREST: Perform Intubation/Mechanical Ventilation Yes   In the event of cardiac or respiratory ARREST: Use NIPPV/BiPAp only if indicated Yes   In the event of cardiac or respiratory ARREST: Administer ACLS medications if indicated Yes   In the event of cardiac or respiratory ARREST: Perform Defibrillation or Cardioversion if indicated No      07/22/15 1422    Code Status History    Date Active Date Inactive Code Status Order ID Comments User Context   07/21/2015 12:26 PM 07/22/2015  2:22 PM Partial Code [id number] Mrs. Virgia Land states her desire is for a trial period of intubation, no more than 10 days, if she were to have respiratory dysfunction only. No intubation for cardiovascular events.  If she is unable to to wean off of the ventilator, she would prefer to be extubated regardless of outcome, let nature take  its course.  Mrs. Herbel also states she would not want chest compressions under any circumstance due to her frail bones. Drue Novel, NP Inpatient   07/20/2015  1:33 PM 07/21/2015 12:26 PM Partial Code [id number]  Donne Hazel, MD Inpatient   07/19/2015 11:16 PM 07/20/2015  1:33 PM Full Code [id number]  Roney Jaffe, MD Inpatient   12/24/2014  3:53 PM 12/25/2014  5:42 PM Full Code [id number]  Erline Hau, MD Inpatient   06/20/2014  6:25 PM 06/24/2014  8:43 PM Full Code [id number]  Doree Albee, MD ED   12/11/2013  7:02 PM 12/12/2013 12:57 PM Full Code [id number]  Donnie Mesa, MD Inpatient   10/30/2013  2:01 PM 11/02/2013 11:08 PM Partial Code [id number]  Kathie Dike, MD Inpatient   10/27/2013  9:45 PM 10/28/2013  5:57 PM Partial Code [id number]  Kathie Dike, MD ED    Advance Directive Documentation        Most Recent Value   Type of Advance Directive  Living will   Pre-existing out of facility DNR order (yellow form or pink MOST form)     "MOST" Form in Place?         Prognosis:   Unable to determine, Based on outcomes including COPD exacerbations, functional status.  Discharge Planning:  Mrs. Virgia Land is initially upset, stating that she is been told she must go to a rehab.  I share that this is not been recommended by physical therapy, but they do recommend home health physical therapy.  We discuss going home with home health today.  I continue to share my worry about her worsening at home, and the benefits of having a Registered nurse monitor her for a few weeks.  Care plan was discussed with nursing staff, case manager, and Dr. Roderic Palau.  Thank you for allowing the Palliative Medicine Team to assist in the care of this patient.   Time In: 0935 Time Out: 1000 Total Time 35 minutes Prolonged Time Billed  no       Greater than 50%  of this time was spent counseling and coordinating care related to the above assessment and plan.  Dove,Tasha A, NP  Please contact Palliative Medicine Team phone at [phone] for questions and concerns.

## 2015-07-24 NOTE — Progress Notes (Signed)
PROGRESS NOTE    Marie Holmes  GLO:[id number] DOB: [birth year] DOA: 07/19/2015 PCP: Arnette Norris, MD   Brief Narrative:  34 yof with history of DM, HTN, and COPD presented with complaints of worsening SOB, chest pain, and productive cough with greenish sputum. She also complained of associated chills and sweats. At baseline she is reported to have DOE. In the ED, CXR revealed possible vascular congestion. She was referred for admission.    Assessment & Plan:   Principal Problem:   COPD with acute exacerbation ([place]) Active Problems:   Essential hypertension   Tobacco abuse   Tremor   Diabetes mellitus, insulin dependent (IDDM), controlled ([facility])   Dyspnea   FTT (failure to thrive) in adult   Depression with anxiety   Acute exacerbation of chronic obstructive pulmonary disease (COPD) ([place])   Palliative care encounter   Goals of care, counseling/discussion   DNR (do not resuscitate) discussion  1. Acute on chronic respiratory failure, secondary to COPD exacerbation. She has been weaned down to room air. 2. COPD exacerbation, slowly improving but still some wheezing. Continue on nebs, steroids. She has completed a course of antibiotics. Encourage pulmonary hygiene 3. DM Type 2. Continue insulin and continue levemir. Blood sugars have been stable 4. Essential HTN, stable. Continue home medications.  5. Tobacco use. Counseled on cessation. Continue nicotine patch.  6. FTT, Possibly related to advanced respiratory illness. Pt is DOE at baseline. PMT consulted. Pt wished for limited code status: request intubation but no CPR.  7. Depression/Anxiety, continue medications.  8. Tremor. Continue to monitor.  9. History of non-hodgkin's lymphoma. Followed by oncology 10. Constipation. Has not had a BM in approximately 1 week. Will give laxatives. May need to try a milk and molasses enema if no improvement.    DVT prophylaxis: Lovenox  Code Status: Limited: Request intubation but no CPR.    Family Communication: discussed with patient. No family present at bedside.  Disposition Plan: Anticipate discharge within 24-48 hours.    Consultants:   Palliative Care   Procedures:   None   Antimicrobials:   Levaquin 5/28>>6/1   Subjective: Has not had a BM in approximately 1 week. She is coughing but without production. Her wheezing is improving.   Objective: Filed Vitals:   07/23/15 2145 07/24/15 0300 07/24/15 0500 07/24/15 0738  BP: 117/57  150/69   Pulse: 93  80   Temp: 98.5 F (36.9 C)  97.8 F (36.6 C)   TempSrc: Oral  Oral   Resp: 20  20   Height:      Weight:  57.924 kg (127 lb 11.2 oz) 57.834 kg (127 lb 8 oz)   SpO2: 99%  95% 95%    Intake/Output Summary (Last 24 hours) at 07/24/15 0745 Last data filed at 07/23/15 1730  Gross per 24 hour  Intake    720 ml  Output      0 ml  Net    720 ml   Filed Weights   07/23/15 0508 07/24/15 0300 07/24/15 0500  Weight: 58.06 kg (128 lb) 57.924 kg (127 lb 11.2 oz) 57.834 kg (127 lb 8 oz)    Examination:  General exam: Appears calm and comfortable  Respiratory system: bilateral wheezes. Respiratory effort normal. Cardiovascular system: S1 & S2 heard, RRR. No JVD, murmurs, rubs, gallops or clicks. No pedal edema. Gastrointestinal system: Abdomen is nondistended, full and nontender. No organomegaly or masses felt. Normal bowel sounds heard. Central nervous system: Alert and oriented. No focal neurological  deficits. Extremities: Symmetric 5 x 5 power. Skin: No rashes, lesions or ulcers Psychiatry: Judgement and insight appear normal. Mood & affect appropriate.    Data Reviewed: I have personally reviewed following labs and imaging studies  CBC:  Recent Labs Lab 07/19/15 2010  WBC 10.1  NEUTROABS 7.3  HGB 14.9  HCT 43.7  MCV 91.4  PLT 373   Basic Metabolic Panel:  Recent Labs Lab 07/19/15 2010 07/21/15 0702 07/23/15 0625 07/24/15 0451  NA 134* 137 136 137  K 4.0 4.1 3.8 4.0  CL 100* 106 103  100*  CO2 '25 26 28 30  '$ GLUCOSE 170* 217* 198* 224*  BUN 14 26* 27* 27*  CREATININE 0.80 0.93 0.70 0.82  CALCIUM 9.1 8.6* 8.8* 9.0   Cardiac Enzymes:  Recent Labs Lab 07/19/15 2010  TROPONINI 0.03   CBG:  Recent Labs Lab 07/22/15 2020 07/23/15 0721 07/23/15 1138 07/23/15 1652 07/23/15 2049  GLUCAP 195* 195* 261* 164* 195*  Urine analysis:    Component Value Date/Time   COLORURINE YELLOW 07/20/2015 0530   APPEARANCEUR HAZY* 07/20/2015 0530   LABSPEC 1.020 07/20/2015 0530   PHURINE 5.5 07/20/2015 0530   GLUCOSEU >1000* 07/20/2015 0530   HGBUR NEGATIVE 07/20/2015 0530   BILIRUBINUR NEGATIVE 07/20/2015 0530   BILIRUBINUR neg 03/05/2015 0944   KETONESUR NEGATIVE 07/20/2015 0530   PROTEINUR NEGATIVE 07/20/2015 0530   PROTEINUR neg 03/05/2015 0944   UROBILINOGEN 0.2 03/05/2015 0944   UROBILINOGEN 0.2 10/25/2014 1839   NITRITE NEGATIVE 07/20/2015 0530   NITRITE neg 03/05/2015 0944   LEUKOCYTESUR NEGATIVE 07/20/2015 0530   Sepsis Labs: '@LABRCNTIP'$ (procalcitonin:4,lacticidven:4)  )No results found for this or any previous visit (from the past 240 hour(s)).   Radiology Studies: No results found.  Scheduled Meds: . aspirin EC  81 mg Oral q morning - 10a  . citalopram  20 mg Oral Daily  . docusate sodium  100 mg Oral BID  . enoxaparin (LOVENOX) injection  40 mg Subcutaneous Q24H  . glipiZIDE  5 mg Oral BID  . guaiFENesin  1,200 mg Oral BID  . insulin aspart  0-20 Units Subcutaneous TID WC  . insulin detemir  18 Units Subcutaneous Daily  . ipratropium-albuterol  3 mL Nebulization Q6H  . losartan  50 mg Oral Daily  . methylPREDNISolone (SOLU-MEDROL) injection  80 mg Intravenous Q8H  . metoprolol succinate  50 mg Oral Daily  . nicotine  14 mg Transdermal Daily  . sodium chloride flush  3 mL Intravenous Q12H   Continuous Infusions:    LOS: 5 days    Time spent: 25 minutes.   Kathie Dike, MD [facility] Pager [phone]  If 7PM-7AM, please  contact night-coverage [email or web address] Password [email or web address] 07/24/2015, 7:45 AM   By signing my name below, I, Delene Ruffini, attest that this documentation has been prepared under the direction and in the presence of Kathie Dike, MD. Electronically Signed: Delene Ruffini 07/24/2015 1:45pm  I, Dr. Kathie Dike, personally performed the services described in this documentaiton. All medical record entries made by the scribe were at my direction and in my presence. I have reviewed the chart and agree that the record reflects my personal performance and is accurate and complete  Kathie Dike, MD, 07/24/2015 2:23 PM

## 2015-07-25 DIAGNOSIS — E119 Type 2 diabetes mellitus without complications: Secondary | ICD-10-CM | POA: Diagnosis not present

## 2015-07-25 DIAGNOSIS — F418 Other specified anxiety disorders: Secondary | ICD-10-CM | POA: Diagnosis not present

## 2015-07-25 DIAGNOSIS — Z720 Tobacco use: Secondary | ICD-10-CM | POA: Diagnosis not present

## 2015-07-25 DIAGNOSIS — I10 Essential (primary) hypertension: Secondary | ICD-10-CM | POA: Diagnosis not present

## 2015-07-25 DIAGNOSIS — J449 Chronic obstructive pulmonary disease, unspecified: Secondary | ICD-10-CM | POA: Diagnosis not present

## 2015-07-25 DIAGNOSIS — J441 Chronic obstructive pulmonary disease with (acute) exacerbation: Secondary | ICD-10-CM | POA: Diagnosis not present

## 2015-07-25 LAB — GLUCOSE, CAPILLARY
GLUCOSE-CAPILLARY: 276 mg/dL — AB (ref 65–99)
Glucose-Capillary: 97 mg/dL (ref 65–99)
Glucose-Capillary: 166 mg/dL — ABNORMAL HIGH (ref 65–99)

## 2015-07-25 MED ORDER — GUAIFENESIN ER 600 MG PO TB12: 600.0000 mg | ORAL_TABLET | Freq: Two times a day (BID) | ORAL | Status: DC

## 2015-07-25 MED ORDER — ALBUTEROL SULFATE (2.5 MG/3ML) 0.083% IN NEBU: 2.5000 mg | INHALATION_SOLUTION | RESPIRATORY_TRACT | Status: DC | PRN

## 2015-07-25 MED ORDER — PREDNISONE 10 MG PO TABS: ORAL_TABLET | ORAL | Status: DC

## 2015-07-25 MED ORDER — BENZONATATE 100 MG PO CAPS: 100.0000 mg | ORAL_CAPSULE | Freq: Three times a day (TID) | ORAL | Status: DC | PRN

## 2015-07-25 MED ORDER — GLIPIZIDE ER 5 MG PO TB24: 5.0000 mg | ORAL_TABLET | Freq: Two times a day (BID) | ORAL | Status: DC

## 2015-07-25 NOTE — Discharge Summary (Signed)
Physician Discharge Summary  Marie Holmes XMI:[id number] DOB: 27-Nov-1935 DOA: 07/19/2015  PCP: [name], MD  Admit date: 07/19/2015 Discharge date: 07/25/2015  Time spent: 35 minutes  Recommendations for Outpatient Follow-up:  1. Follow up with PCP in 1-2 weeks 2. Discharge with steroids, breathing treatments 3. Home health PT   Discharge Diagnoses:  Principal Problem:   COPD with acute exacerbation ([place]) Active Problems:   Essential hypertension   Tobacco abuse   Tremor   Diabetes mellitus, insulin dependent (IDDM), controlled ([facility])   Dyspnea   FTT (failure to thrive) in adult   Depression with anxiety   Acute exacerbation of chronic obstructive pulmonary disease (COPD) ([place])   Palliative care encounter   Goals of care, counseling/discussion   DNR (do not resuscitate) discussion   Discharge Condition: improved  Diet recommendation: carb modified  Filed Weights   07/24/15 0300 07/24/15 0500 07/25/15 0444  Weight: 57.924 kg (127 lb 11.2 oz) 57.834 kg (127 lb 8 oz) 58.605 kg (129 lb 3.2 oz)    History of present illness:  23 yof with a hx of DM, HTN, and COPD, presented to the ED with complaints of worsening SOB and cough for several days. She reported production of greenish sputum with cough as well as chills and sweats for several days. Per daughter, she was experiencing constant dyspnea and was DOE at baseline. Her daughter found her looking worse than usual and called EMS. Patient endorsed associated chest pain while coughing as well. While in the ED, patient's HR increased to 150 after nebulizer treatments but then dropped to 122. CXR did not indicate any PNA. Hospitalist was asked to refer for admission for management of acute respiratory failure.   Hospital Course:  Patient was found to have acute respiratory failure, secondary to COPD exacerbation. While being evaluated in the ED, CXR did not indicate any PNA. She was started on nebs, steroids and a course of Levaquin  which she completed on 6/01. With treatment, her respiratory status has improved. Wheezing has near resolved and respiratory status appears to be approaching baseline. Patient was able to ambulate a significant distance on room air and did not qualify for supplemental O2. She has been transitioned to a prednisone taper and will be sent home with nebulizer treatments. The remainder of her medical issues remained stable. Palliative care was consulted to discuss goals of care, and her code status was determined to be partial code with intubation, but no CPR. She was also evaluated by physical therapy who determined that the patient could adequately ambulate by herself, so she was cleared for discharge. It is recommended that she follow up with her PCP in 1-2 weeks.   Procedures:  none  Consultations:  Palliative  Discharge Exam: Filed Vitals:   07/25/15 0444 07/25/15 0630  BP: 130/64 151/57  Pulse: 84 75  Temp: 98.1 F (36.7 C) 97.5 F (36.4 C)  Resp: 20 20   Examination:  General exam: Appears calm and comfortable  Respiratory system:  Respiratory effort normal. mild wheeze bilaterally Cardiovascular system: S1 & S2 heard, RRR. No JVD, murmurs, rubs, gallops or clicks. No pedal edema. Gastrointestinal system: Abdomen is nondistended, soft and nontender. No organomegaly or masses felt. Normal bowel sounds heard. Central nervous system: Alert and oriented. No focal neurological deficits. Extremities: Symmetric 5 x 5 power. Skin: No rashes, lesions or ulcers Psychiatry: Judgement and insight appear normal. Mood & affect appropriate.   Discharge Instructions   Discharge Instructions    Diet -  low sodium heart healthy    Complete by:  As directed      Increase activity slowly    Complete by:  As directed           Current Discharge Medication List    START taking these medications   Details  albuterol (PROVENTIL) (2.5 MG/3ML) 0.083% nebulizer solution Take 3 mLs (2.5 mg total)  by nebulization every 4 (four) hours as needed for wheezing or shortness of breath. Qty: 75 mL, Refills: 12    benzonatate (TESSALON) 100 MG capsule Take 1 capsule (100 mg total) by mouth 3 (three) times daily as needed for cough. Qty: 20 capsule, Refills: 0    guaiFENesin (MUCINEX) 600 MG 12 hr tablet Take 1 tablet (600 mg total) by mouth 2 (two) times daily. Qty: 30 tablet, Refills: 0    predniSONE (DELTASONE) 10 MG tablet Take 37m po daily for 2 days then 342mpo daily for 2 days then 2079maily for 2 days then 83m55mily for 2 days then stop Qty: 20 tablet, Refills: 0      CONTINUE these medications which have CHANGED   Details  glipiZIDE (GLUCOTROL XL) 5 MG 24 hr tablet Take 1 tablet (5 mg total) by mouth 2 (two) times daily. Qty: 60 tablet, Refills: 1      CONTINUE these medications which have NOT CHANGED   Details  albuterol (PROVENTIL HFA;VENTOLIN HFA) 108 (90 BASE) MCG/ACT inhaler Inhale 2 puffs into the lungs every 6 (six) hours as needed for wheezing or shortness of breath. Qty: 1 Inhaler, Refills: 2    ALPRAZolam (XANAX) 0.25 MG tablet TAKE ONE TABLET BY MOUTH THREE TIMES DAILY AS NEEDED FOR ANXIETY Qty: 90 tablet, Refills: 0    aspirin EC 81 MG tablet Take 81 mg by mouth every morning.    budesonide-formoterol (SYMBICORT) 160-4.5 MCG/ACT inhaler Inhale 2 puffs into the lungs 2 (two) times daily. Qty: 1 Inhaler, Refills: 12    citalopram (CELEXA) 40 MG tablet Take 1 tablet (40 mg total) by mouth daily. Qty: 90 tablet, Refills: 3    Insulin Detemir (LEVEMIR FLEXTOUCH) 100 UNIT/ML Pen Inject 16 Units into the skin daily at 10 pm. Qty: 15 mL, Refills: 2    Insulin Pen Needle 32G X 4 MM MISC Use to inject 4x daily. Relion Brand Qty: 130 each, Refills: 4    Insulin Syringe-Needle U-100 (INSULIN SYRINGE .3CC/31GX5/16") 31G X 5/16" 0.3 ML MISC Use to inject insulin 3 times daily as instructed. Qty: 100 each, Refills: 5    metoprolol succinate (TOPROL-XL) 50 MG 24 hr  tablet TAKE ONE TABLET BY MOUTH ONCE DAILY-- TAKE WITH, OR IMMEDIATELY FOLLOWING A MEAL Qty: 30 tablet, Refills: 5    ONE TOUCH LANCETS MISC Use as directed to test blood sugar three times daily Dx:R73.09 Qty: 300 each, Refills: 2    ONE TOUCH ULTRA TEST test strip USE ONE STRIP TO CHECK GLUCOSE THREE TIMES DAILY Qty: 100 each, Refills: 11    vitamin B-12 (CYANOCOBALAMIN) 500 MCG tablet Take 500 mcg by mouth daily.    Blood Glucose Monitoring Suppl (ONE TOUCH BASIC SYSTEM) W/DEVICE KIT Use as directed to test blood sugar three times daily Dx:R73.09 Qty: 1 each, Refills: 0    HYDROcodone-acetaminophen (NORCO) 5-325 MG tablet Take 1 tablet by mouth every 6 (six) hours as needed for moderate pain. Qty: 30 tablet, Refills: 0    insulin aspart (NOVOLOG) 100 UNIT/ML injection Inject 6 Units into the skin 3 (three) times daily  before meals. Qty: 10 mL, Refills: 5    losartan (COZAAR) 50 MG tablet TAKE ONE TABLET BY MOUTH ONCE DAILY IN THE MORNING Qty: 30 tablet, Refills: 0       Allergies  Allergen Reactions  . Sulfonamide Derivatives Anaphylaxis  . Metformin And Related Diarrhea   Follow-up Information    Follow up with Arnette Norris, MD.   Specialty:  Family Medicine   Contact information:   [place] [address] [phone]      The results of significant diagnostics from this hospitalization (including imaging, microbiology, ancillary and laboratory) are listed below for reference.    Significant Diagnostic Studies: Dg Chest Port 1 View  07/19/2015  CLINICAL DATA:  Acute onset of shortness of breath and productive cough. Bilateral ear fullness and headache. Initial encounter. EXAM: PORTABLE CHEST 1 VIEW COMPARISON:  Chest radiograph performed 12/24/2014, and CT of the chest performed 04/28/2015 FINDINGS: The lungs are well-aerated. Vascular congestion is noted. Increased interstitial markings raise concern for mild interstitial edema. There is no evidence of  pleural effusion or pneumothorax. The cardiomediastinal silhouette is within normal limits. No acute osseous abnormalities are seen. IMPRESSION: Vascular congestion noted. Increased interstitial markings raise concern for mild interstitial edema. Electronically Signed   By: Garald Balding M.D.   On: 07/19/2015 20:20    Microbiology: No results found for this or any previous visit (from the past 240 hour(s)).   Labs: Basic Metabolic Panel:  Recent Labs Lab 07/19/15 2010 07/21/15 0702 07/23/15 0625 07/24/15 0451  NA 134* 137 136 137  K 4.0 4.1 3.8 4.0  CL 100* 106 103 100*  CO2 25 26 28 30   GLUCOSE 170* 217* 198* 224*  BUN 14 26* 27* 27*  CREATININE 0.80 0.93 0.70 0.82  CALCIUM 9.1 8.6* 8.8* 9.0   Liver Function Tests: No results for input(s): AST, ALT, ALKPHOS, BILITOT, PROT, ALBUMIN in the last 168 hours. No results for input(s): LIPASE, AMYLASE in the last 168 hours. No results for input(s): AMMONIA in the last 168 hours. CBC:  Recent Labs Lab 07/19/15 2010  WBC 10.1  NEUTROABS 7.3  HGB 14.9  HCT 43.7  MCV 91.4  PLT 235   Cardiac Enzymes:  Recent Labs Lab 07/19/15 2010  TROPONINI 0.03   BNP: BNP (last 3 results)  Recent Labs  07/19/15 2010  BNP 129.0*    ProBNP (last 3 results) No results for input(s): PROBNP in the last 8760 hours.  CBG:  Recent Labs Lab 07/24/15 1111 07/24/15 1634 07/24/15 2130 07/25/15 0618 07/25/15 0734  GLUCAP 260* 104* 331* 97 166*    Signed:  Kathie Dike, MD.  [facility] 07/25/2015, 10:48 AM  By signing my name below, I, Delene Ruffini, attest that this documentation has been prepared under the direction and in the presence of Kathie Dike, MD. Electronically Signed: Delene Ruffini 07/25/2015 10:25am  I, Dr. Kathie Dike, personally performed the services described in this documentaiton. All medical record entries made by the scribe were at my direction and in my presence. I have reviewed the  chart and agree that the record reflects my personal performance and is accurate and complete  Kathie Dike, MD, 07/25/2015 10:48 AM

## 2015-07-25 NOTE — Progress Notes (Signed)
CM confirmed patient was able to get nebulizer machine from [place] [facility] received at [facility] to start services.

## 2015-07-25 NOTE — Progress Notes (Signed)
Discharged PT per MD order and protocol. Discharge handouts reviewed/explained. Prescriptions given.  Education completed.  Pt verbalized understanding and left with all belongings. VSS. IV catheter D/C.  Patient wheeled down by staff member.

## 2015-07-28 DIAGNOSIS — F329 Major depressive disorder, single episode, unspecified: Secondary | ICD-10-CM | POA: Diagnosis not present

## 2015-07-28 DIAGNOSIS — Z794 Long term (current) use of insulin: Secondary | ICD-10-CM | POA: Diagnosis not present

## 2015-07-28 DIAGNOSIS — J441 Chronic obstructive pulmonary disease with (acute) exacerbation: Secondary | ICD-10-CM | POA: Diagnosis not present

## 2015-07-28 DIAGNOSIS — Z7982 Long term (current) use of aspirin: Secondary | ICD-10-CM | POA: Diagnosis not present

## 2015-07-28 DIAGNOSIS — F419 Anxiety disorder, unspecified: Secondary | ICD-10-CM | POA: Diagnosis not present

## 2015-07-28 DIAGNOSIS — I10 Essential (primary) hypertension: Secondary | ICD-10-CM | POA: Diagnosis not present

## 2015-07-28 DIAGNOSIS — E119 Type 2 diabetes mellitus without complications: Secondary | ICD-10-CM | POA: Diagnosis not present

## 2015-07-28 DIAGNOSIS — E1165 Type 2 diabetes mellitus with hyperglycemia: Secondary | ICD-10-CM | POA: Diagnosis not present

## 2015-07-29 DIAGNOSIS — R600 Localized edema: Secondary | ICD-10-CM | POA: Diagnosis not present

## 2015-07-29 DIAGNOSIS — R300 Dysuria: Secondary | ICD-10-CM | POA: Diagnosis not present

## 2015-08-05 DIAGNOSIS — F329 Major depressive disorder, single episode, unspecified: Secondary | ICD-10-CM | POA: Diagnosis not present

## 2015-08-05 DIAGNOSIS — J441 Chronic obstructive pulmonary disease with (acute) exacerbation: Secondary | ICD-10-CM | POA: Diagnosis not present

## 2015-08-05 DIAGNOSIS — Z7982 Long term (current) use of aspirin: Secondary | ICD-10-CM | POA: Diagnosis not present

## 2015-08-05 DIAGNOSIS — I10 Essential (primary) hypertension: Secondary | ICD-10-CM | POA: Diagnosis not present

## 2015-08-05 DIAGNOSIS — F419 Anxiety disorder, unspecified: Secondary | ICD-10-CM | POA: Diagnosis not present

## 2015-08-05 DIAGNOSIS — Z794 Long term (current) use of insulin: Secondary | ICD-10-CM | POA: Diagnosis not present

## 2015-08-05 DIAGNOSIS — E119 Type 2 diabetes mellitus without complications: Secondary | ICD-10-CM | POA: Diagnosis not present

## 2015-08-05 NOTE — Telephone Encounter (Signed)
I've been out of the office.  Please call to check on pt.

## 2015-08-05 NOTE — Telephone Encounter (Signed)
Spoke to pt who states she "is fine" and has not changed PCP to Dr [place] Crane

## 2015-08-06 DIAGNOSIS — E1165 Type 2 diabetes mellitus with hyperglycemia: Secondary | ICD-10-CM | POA: Diagnosis not present

## 2015-08-06 DIAGNOSIS — I10 Essential (primary) hypertension: Secondary | ICD-10-CM | POA: Diagnosis not present

## 2015-08-06 DIAGNOSIS — J449 Chronic obstructive pulmonary disease, unspecified: Secondary | ICD-10-CM | POA: Diagnosis not present

## 2015-08-12 DIAGNOSIS — J441 Chronic obstructive pulmonary disease with (acute) exacerbation: Secondary | ICD-10-CM | POA: Diagnosis not present

## 2015-08-12 DIAGNOSIS — I10 Essential (primary) hypertension: Secondary | ICD-10-CM | POA: Diagnosis not present

## 2015-08-12 DIAGNOSIS — E119 Type 2 diabetes mellitus without complications: Secondary | ICD-10-CM | POA: Diagnosis not present

## 2015-08-12 DIAGNOSIS — Z7982 Long term (current) use of aspirin: Secondary | ICD-10-CM | POA: Diagnosis not present

## 2015-08-12 DIAGNOSIS — F329 Major depressive disorder, single episode, unspecified: Secondary | ICD-10-CM | POA: Diagnosis not present

## 2015-08-12 DIAGNOSIS — F419 Anxiety disorder, unspecified: Secondary | ICD-10-CM | POA: Diagnosis not present

## 2015-08-12 DIAGNOSIS — Z794 Long term (current) use of insulin: Secondary | ICD-10-CM | POA: Diagnosis not present

## 2015-08-24 DIAGNOSIS — J449 Chronic obstructive pulmonary disease, unspecified: Secondary | ICD-10-CM | POA: Diagnosis not present

## 2015-09-01 DIAGNOSIS — E1165 Type 2 diabetes mellitus with hyperglycemia: Secondary | ICD-10-CM | POA: Diagnosis not present

## 2015-09-01 DIAGNOSIS — E782 Mixed hyperlipidemia: Secondary | ICD-10-CM | POA: Diagnosis not present

## 2015-09-01 DIAGNOSIS — I10 Essential (primary) hypertension: Secondary | ICD-10-CM | POA: Diagnosis not present

## 2015-09-04 DIAGNOSIS — R251 Tremor, unspecified: Secondary | ICD-10-CM | POA: Diagnosis not present

## 2015-09-04 DIAGNOSIS — E1165 Type 2 diabetes mellitus with hyperglycemia: Secondary | ICD-10-CM | POA: Diagnosis not present

## 2015-09-04 DIAGNOSIS — I10 Essential (primary) hypertension: Secondary | ICD-10-CM | POA: Diagnosis not present

## 2015-09-04 DIAGNOSIS — F419 Anxiety disorder, unspecified: Secondary | ICD-10-CM | POA: Diagnosis not present

## 2015-09-21 DIAGNOSIS — R251 Tremor, unspecified: Secondary | ICD-10-CM | POA: Diagnosis not present

## 2015-09-21 DIAGNOSIS — R531 Weakness: Secondary | ICD-10-CM | POA: Diagnosis not present

## 2015-09-21 DIAGNOSIS — R5383 Other fatigue: Secondary | ICD-10-CM | POA: Diagnosis not present

## 2015-09-21 DIAGNOSIS — I509 Heart failure, unspecified: Secondary | ICD-10-CM | POA: Diagnosis not present

## 2015-09-21 DIAGNOSIS — I10 Essential (primary) hypertension: Secondary | ICD-10-CM | POA: Diagnosis not present

## 2015-09-24 DIAGNOSIS — J449 Chronic obstructive pulmonary disease, unspecified: Secondary | ICD-10-CM | POA: Diagnosis not present

## 2015-10-01 ENCOUNTER — Other Ambulatory Visit (HOSPITAL_BASED_OUTPATIENT_CLINIC_OR_DEPARTMENT_OTHER): Payer: Self-pay | Admitting: Internal Medicine

## 2015-10-01 DIAGNOSIS — I5032 Chronic diastolic (congestive) heart failure: Secondary | ICD-10-CM

## 2015-10-02 ENCOUNTER — Other Ambulatory Visit (HOSPITAL_COMMUNITY): Payer: Self-pay | Admitting: Internal Medicine

## 2015-10-02 DIAGNOSIS — I5032 Chronic diastolic (congestive) heart failure: Secondary | ICD-10-CM

## 2015-10-05 ENCOUNTER — Ambulatory Visit: Payer: PPO | Admitting: Neurology

## 2015-10-09 ENCOUNTER — Other Ambulatory Visit (HOSPITAL_COMMUNITY): Payer: Self-pay | Admitting: Internal Medicine

## 2015-10-09 ENCOUNTER — Ambulatory Visit (HOSPITAL_COMMUNITY)
Admission: RE | Admit: 2015-10-09 | Discharge: 2015-10-09 | Disposition: A | Discharge status: Home / Self Care | Payer: PPO | Source: Ambulatory Visit | Attending: Internal Medicine | Admitting: Internal Medicine

## 2015-10-09 DIAGNOSIS — I110 Hypertensive heart disease with heart failure: Secondary | ICD-10-CM | POA: Insufficient documentation

## 2015-10-09 DIAGNOSIS — I10 Essential (primary) hypertension: Secondary | ICD-10-CM | POA: Insufficient documentation

## 2015-10-09 DIAGNOSIS — I5032 Chronic diastolic (congestive) heart failure: Secondary | ICD-10-CM

## 2015-10-09 DIAGNOSIS — Z720 Tobacco use: Secondary | ICD-10-CM | POA: Insufficient documentation

## 2015-10-09 DIAGNOSIS — J449 Chronic obstructive pulmonary disease, unspecified: Secondary | ICD-10-CM | POA: Diagnosis not present

## 2015-10-09 DIAGNOSIS — I059 Rheumatic mitral valve disease, unspecified: Secondary | ICD-10-CM | POA: Diagnosis not present

## 2015-10-09 DIAGNOSIS — E119 Type 2 diabetes mellitus without complications: Secondary | ICD-10-CM | POA: Diagnosis not present

## 2015-10-09 DIAGNOSIS — I351 Nonrheumatic aortic (valve) insufficiency: Secondary | ICD-10-CM | POA: Insufficient documentation

## 2015-10-09 DIAGNOSIS — E785 Hyperlipidemia, unspecified: Secondary | ICD-10-CM | POA: Diagnosis not present

## 2015-10-09 NOTE — Progress Notes (Signed)
*  PRELIMINARY RESULTS* Echocardiogram 2D Echocardiogram has been performed.  Marie Holmes 10/09/2015, 10:30 AM

## 2015-10-12 DIAGNOSIS — G4709 Other insomnia: Secondary | ICD-10-CM | POA: Diagnosis not present

## 2015-10-12 DIAGNOSIS — I10 Essential (primary) hypertension: Secondary | ICD-10-CM | POA: Diagnosis not present

## 2015-10-12 DIAGNOSIS — J441 Chronic obstructive pulmonary disease with (acute) exacerbation: Secondary | ICD-10-CM | POA: Diagnosis not present

## 2015-10-12 DIAGNOSIS — I25119 Atherosclerotic heart disease of native coronary artery with unspecified angina pectoris: Secondary | ICD-10-CM | POA: Diagnosis not present

## 2015-10-12 DIAGNOSIS — E1165 Type 2 diabetes mellitus with hyperglycemia: Secondary | ICD-10-CM | POA: Diagnosis not present

## 2015-10-12 DIAGNOSIS — J0100 Acute maxillary sinusitis, unspecified: Secondary | ICD-10-CM | POA: Diagnosis not present

## 2015-10-12 DIAGNOSIS — N390 Urinary tract infection, site not specified: Secondary | ICD-10-CM | POA: Diagnosis not present

## 2015-10-12 DIAGNOSIS — R251 Tremor, unspecified: Secondary | ICD-10-CM | POA: Diagnosis not present

## 2015-10-12 DIAGNOSIS — I509 Heart failure, unspecified: Secondary | ICD-10-CM | POA: Diagnosis not present

## 2015-10-25 DIAGNOSIS — J449 Chronic obstructive pulmonary disease, unspecified: Secondary | ICD-10-CM | POA: Diagnosis not present

## 2015-11-24 DIAGNOSIS — J449 Chronic obstructive pulmonary disease, unspecified: Secondary | ICD-10-CM | POA: Diagnosis not present

## 2015-12-15 ENCOUNTER — Encounter: Payer: Self-pay | Admitting: Cardiovascular Disease

## 2015-12-25 DIAGNOSIS — J449 Chronic obstructive pulmonary disease, unspecified: Secondary | ICD-10-CM | POA: Diagnosis not present

## 2016-01-24 DIAGNOSIS — J449 Chronic obstructive pulmonary disease, unspecified: Secondary | ICD-10-CM | POA: Diagnosis not present

## 2016-01-25 DIAGNOSIS — M79604 Pain in right leg: Secondary | ICD-10-CM | POA: Diagnosis not present

## 2016-01-25 DIAGNOSIS — M79605 Pain in left leg: Secondary | ICD-10-CM | POA: Diagnosis not present

## 2016-02-24 DIAGNOSIS — E119 Type 2 diabetes mellitus without complications: Secondary | ICD-10-CM | POA: Diagnosis not present

## 2016-02-24 DIAGNOSIS — I10 Essential (primary) hypertension: Secondary | ICD-10-CM | POA: Diagnosis not present

## 2016-02-24 DIAGNOSIS — J449 Chronic obstructive pulmonary disease, unspecified: Secondary | ICD-10-CM | POA: Diagnosis not present

## 2016-02-26 DIAGNOSIS — Z6824 Body mass index (BMI) 24.0-24.9, adult: Secondary | ICD-10-CM | POA: Diagnosis not present

## 2016-02-26 DIAGNOSIS — K649 Unspecified hemorrhoids: Secondary | ICD-10-CM | POA: Diagnosis not present

## 2016-02-26 DIAGNOSIS — F411 Generalized anxiety disorder: Secondary | ICD-10-CM | POA: Diagnosis not present

## 2016-02-26 DIAGNOSIS — F331 Major depressive disorder, recurrent, moderate: Secondary | ICD-10-CM | POA: Diagnosis not present

## 2016-02-26 DIAGNOSIS — R1031 Right lower quadrant pain: Secondary | ICD-10-CM | POA: Diagnosis not present

## 2016-02-26 DIAGNOSIS — E1165 Type 2 diabetes mellitus with hyperglycemia: Secondary | ICD-10-CM | POA: Diagnosis not present

## 2016-02-26 DIAGNOSIS — I10 Essential (primary) hypertension: Secondary | ICD-10-CM | POA: Diagnosis not present

## 2016-03-14 ENCOUNTER — Ambulatory Visit (HOSPITAL_COMMUNITY)
Admission: RE | Admit: 2016-03-14 | Discharge: 2016-03-14 | Disposition: A | Discharge status: Home / Self Care | Payer: PPO | Source: Ambulatory Visit | Attending: Adult Health Nurse Practitioner | Admitting: Adult Health Nurse Practitioner

## 2016-03-14 ENCOUNTER — Other Ambulatory Visit (HOSPITAL_COMMUNITY): Payer: Self-pay | Admitting: Adult Health Nurse Practitioner

## 2016-03-14 DIAGNOSIS — R05 Cough: Secondary | ICD-10-CM

## 2016-03-14 DIAGNOSIS — R059 Cough, unspecified: Secondary | ICD-10-CM

## 2016-03-14 DIAGNOSIS — R0981 Nasal congestion: Secondary | ICD-10-CM | POA: Diagnosis not present

## 2016-03-14 DIAGNOSIS — R0989 Other specified symptoms and signs involving the circulatory and respiratory systems: Secondary | ICD-10-CM | POA: Diagnosis not present

## 2016-03-21 ENCOUNTER — Other Ambulatory Visit (HOSPITAL_COMMUNITY): Payer: Self-pay | Admitting: Adult Health Nurse Practitioner

## 2016-03-21 DIAGNOSIS — R1031 Right lower quadrant pain: Secondary | ICD-10-CM

## 2016-03-21 DIAGNOSIS — Z8543 Personal history of malignant neoplasm of ovary: Secondary | ICD-10-CM

## 2016-03-22 ENCOUNTER — Encounter (HOSPITAL_COMMUNITY): Payer: Self-pay

## 2016-03-26 DIAGNOSIS — J449 Chronic obstructive pulmonary disease, unspecified: Secondary | ICD-10-CM | POA: Diagnosis not present

## 2016-03-28 ENCOUNTER — Ambulatory Visit (HOSPITAL_COMMUNITY)
Admission: RE | Admit: 2016-03-28 | Discharge: 2016-03-28 | Disposition: A | Discharge status: Home / Self Care | Payer: PPO | Source: Ambulatory Visit | Attending: Adult Health Nurse Practitioner | Admitting: Adult Health Nurse Practitioner

## 2016-03-28 DIAGNOSIS — Z90710 Acquired absence of both cervix and uterus: Secondary | ICD-10-CM | POA: Insufficient documentation

## 2016-03-28 DIAGNOSIS — R1031 Right lower quadrant pain: Secondary | ICD-10-CM

## 2016-03-28 DIAGNOSIS — Z90721 Acquired absence of ovaries, unilateral: Secondary | ICD-10-CM | POA: Insufficient documentation

## 2016-03-28 DIAGNOSIS — Z8543 Personal history of malignant neoplasm of ovary: Secondary | ICD-10-CM

## 2016-04-01 DIAGNOSIS — J441 Chronic obstructive pulmonary disease with (acute) exacerbation: Secondary | ICD-10-CM | POA: Diagnosis not present

## 2016-04-01 DIAGNOSIS — R0609 Other forms of dyspnea: Secondary | ICD-10-CM | POA: Diagnosis not present

## 2016-04-23 DIAGNOSIS — J449 Chronic obstructive pulmonary disease, unspecified: Secondary | ICD-10-CM | POA: Diagnosis not present

## 2016-04-25 DIAGNOSIS — E1165 Type 2 diabetes mellitus with hyperglycemia: Secondary | ICD-10-CM | POA: Diagnosis not present

## 2016-04-25 DIAGNOSIS — J0100 Acute maxillary sinusitis, unspecified: Secondary | ICD-10-CM | POA: Diagnosis not present

## 2016-04-25 DIAGNOSIS — J441 Chronic obstructive pulmonary disease with (acute) exacerbation: Secondary | ICD-10-CM | POA: Diagnosis not present

## 2016-04-25 DIAGNOSIS — Z6824 Body mass index (BMI) 24.0-24.9, adult: Secondary | ICD-10-CM | POA: Diagnosis not present

## 2016-04-29 IMAGING — CR DG CHEST 2V
2 series · 2 of 2 positions shown · non-contrast
Comparison: Chest radiograph 12/04/2013 and chest CT 10/30/2013

CLINICAL DATA: Productive cough for 2 days.

EXAM:
CHEST  2 VIEW

[w chest pa]
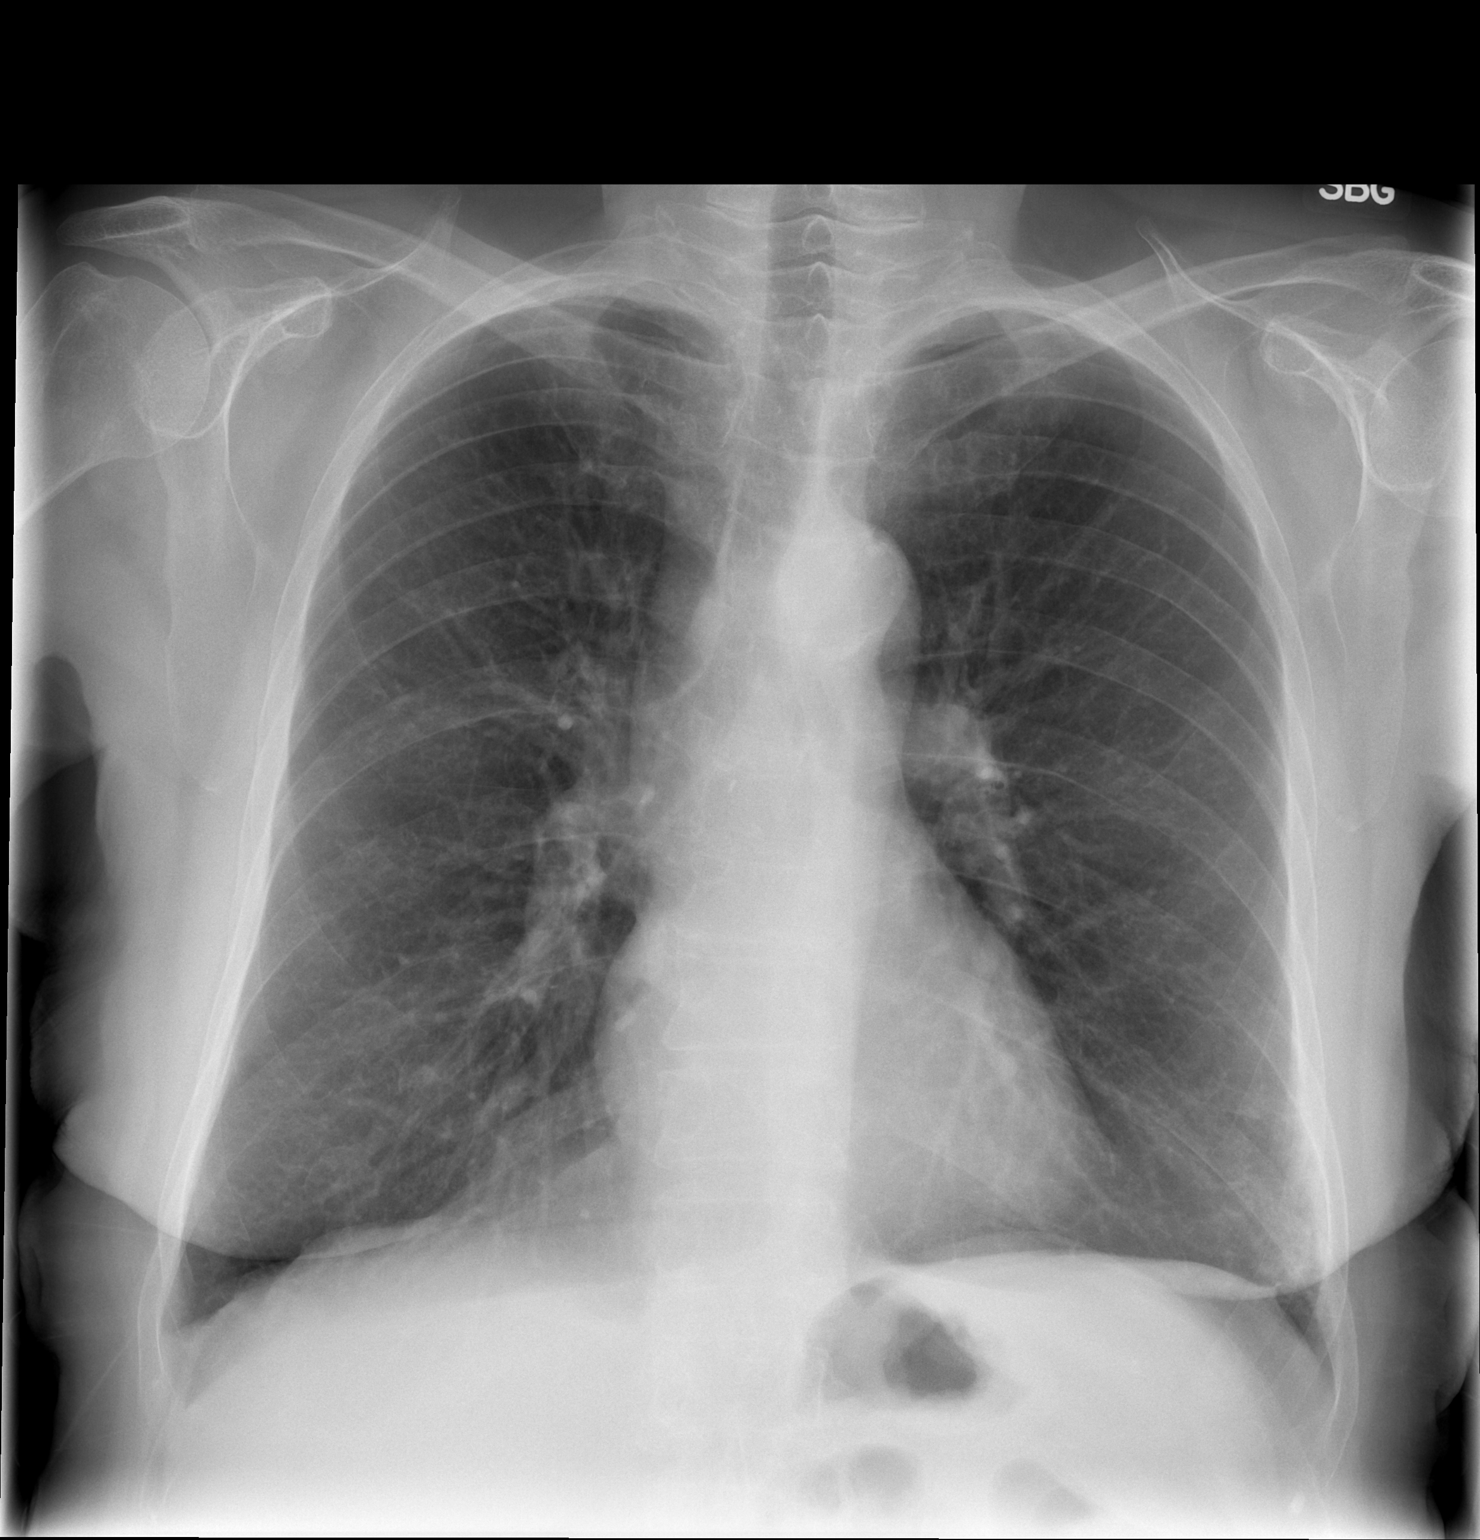

[w chest lat]
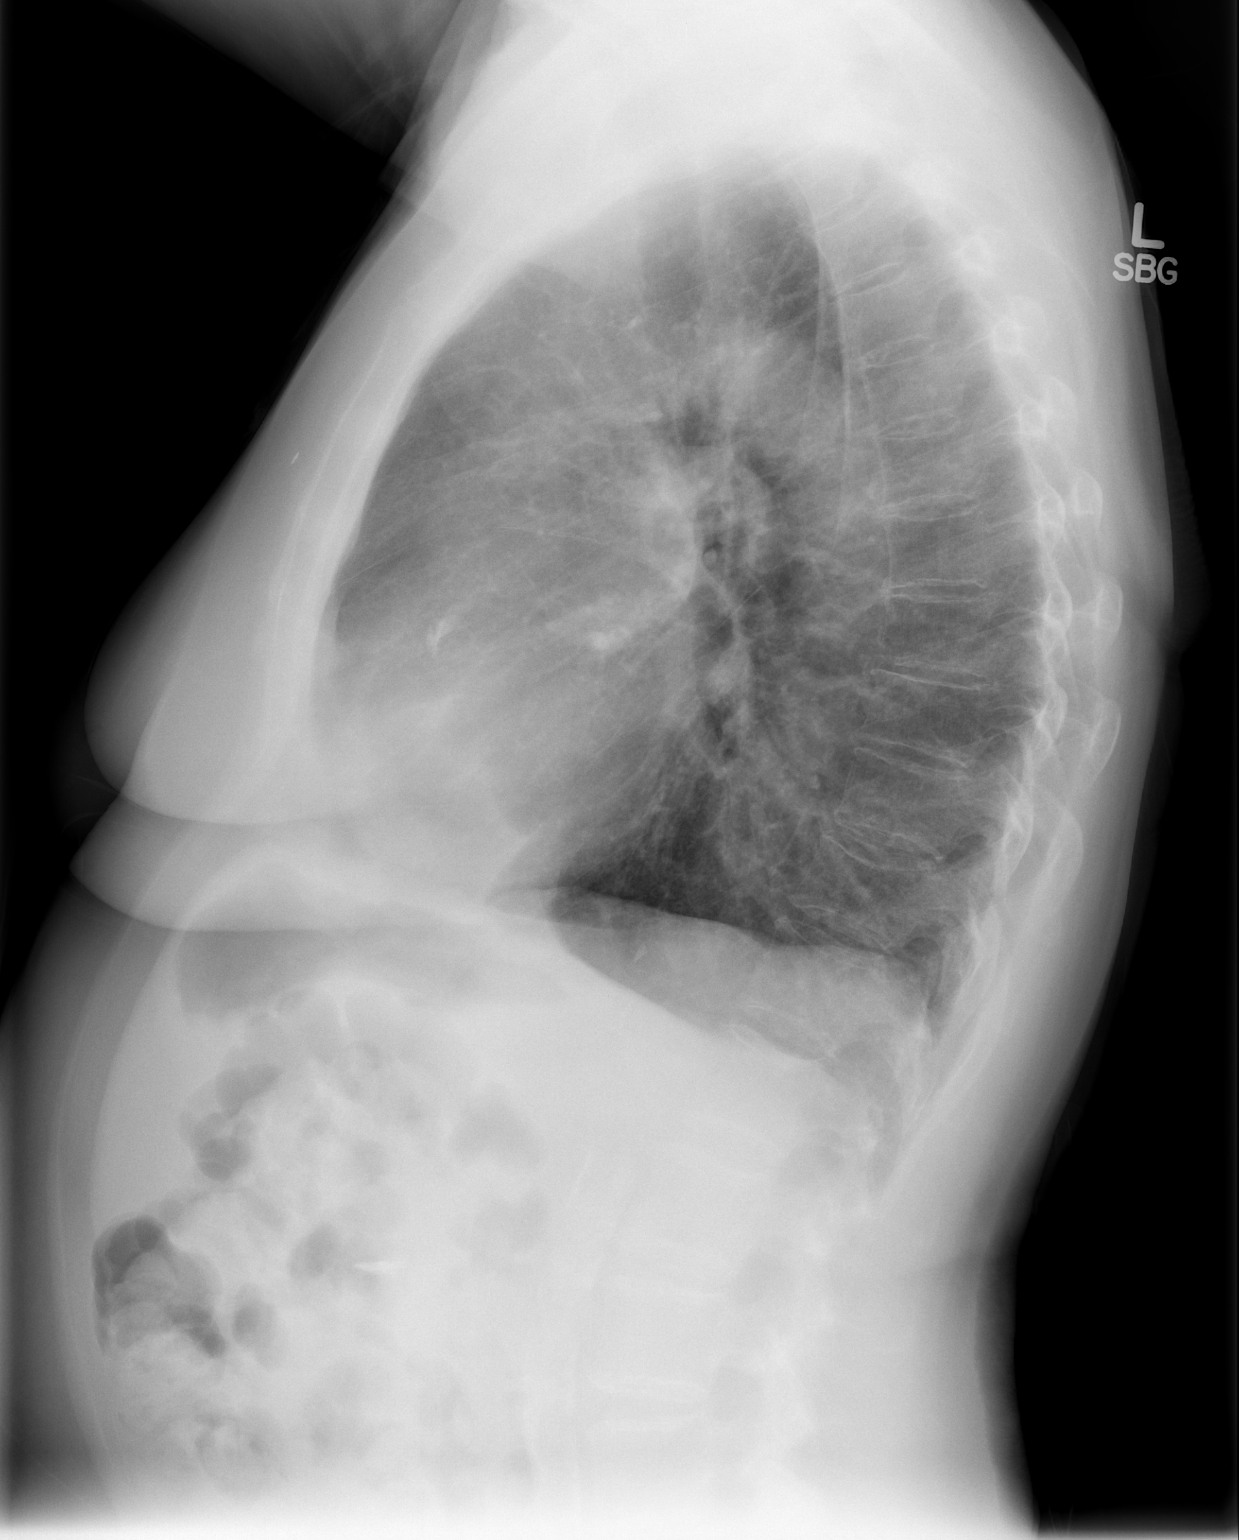

[2 of 2 positions shown; findings below may reference images not displayed]

FINDINGS: Heart, mediastinal, and hilar contours are normal. The lungs are
mildly hyperinflated, similar to prior exam. There is slight
interstitial prominence of the peripheral lateral left lung base,
stable. No airspace disease or pleural effusion. Negative for
pneumothorax. Bones are osteopenic.
IMPRESSION: Stable exam.  No acute cardiopulmonary disease.

## 2016-05-24 DIAGNOSIS — J449 Chronic obstructive pulmonary disease, unspecified: Secondary | ICD-10-CM | POA: Diagnosis not present

## 2016-06-06 DIAGNOSIS — E1165 Type 2 diabetes mellitus with hyperglycemia: Secondary | ICD-10-CM | POA: Diagnosis not present

## 2016-06-06 DIAGNOSIS — R251 Tremor, unspecified: Secondary | ICD-10-CM | POA: Diagnosis not present

## 2016-06-06 DIAGNOSIS — F331 Major depressive disorder, recurrent, moderate: Secondary | ICD-10-CM | POA: Diagnosis not present

## 2016-06-06 DIAGNOSIS — F411 Generalized anxiety disorder: Secondary | ICD-10-CM | POA: Diagnosis not present

## 2016-06-06 DIAGNOSIS — R109 Unspecified abdominal pain: Secondary | ICD-10-CM | POA: Diagnosis not present

## 2016-06-06 DIAGNOSIS — I10 Essential (primary) hypertension: Secondary | ICD-10-CM | POA: Diagnosis not present

## 2016-06-06 DIAGNOSIS — R51 Headache: Secondary | ICD-10-CM | POA: Diagnosis not present

## 2016-06-06 DIAGNOSIS — Z6823 Body mass index (BMI) 23.0-23.9, adult: Secondary | ICD-10-CM | POA: Diagnosis not present

## 2016-06-07 DIAGNOSIS — E039 Hypothyroidism, unspecified: Secondary | ICD-10-CM | POA: Diagnosis not present

## 2016-06-07 DIAGNOSIS — E1165 Type 2 diabetes mellitus with hyperglycemia: Secondary | ICD-10-CM | POA: Diagnosis not present

## 2016-06-07 DIAGNOSIS — I10 Essential (primary) hypertension: Secondary | ICD-10-CM | POA: Diagnosis not present

## 2016-06-08 ENCOUNTER — Other Ambulatory Visit (HOSPITAL_COMMUNITY): Payer: Self-pay | Admitting: Internal Medicine

## 2016-06-08 DIAGNOSIS — R1900 Intra-abdominal and pelvic swelling, mass and lump, unspecified site: Secondary | ICD-10-CM

## 2016-06-08 DIAGNOSIS — R109 Unspecified abdominal pain: Secondary | ICD-10-CM

## 2016-06-22 ENCOUNTER — Ambulatory Visit (HOSPITAL_COMMUNITY)
Admission: RE | Admit: 2016-06-22 | Discharge: 2016-06-22 | Disposition: A | Discharge status: Home / Self Care | Payer: PPO | Source: Ambulatory Visit | Attending: Internal Medicine | Admitting: Internal Medicine

## 2016-06-22 DIAGNOSIS — R197 Diarrhea, unspecified: Secondary | ICD-10-CM | POA: Diagnosis not present

## 2016-06-22 DIAGNOSIS — R109 Unspecified abdominal pain: Secondary | ICD-10-CM | POA: Diagnosis not present

## 2016-06-22 DIAGNOSIS — R1900 Intra-abdominal and pelvic swelling, mass and lump, unspecified site: Secondary | ICD-10-CM | POA: Diagnosis not present

## 2016-06-22 DIAGNOSIS — N200 Calculus of kidney: Secondary | ICD-10-CM | POA: Diagnosis not present

## 2016-06-22 DIAGNOSIS — I700 Atherosclerosis of aorta: Secondary | ICD-10-CM | POA: Diagnosis not present

## 2016-06-22 MED ORDER — IOPAMIDOL (ISOVUE-300) INJECTION 61%
100.0000 mL | Freq: Once | INTRAVENOUS | Status: AC | PRN
  Administered 2016-06-22: 100 mL via INTRAVENOUS

## 2016-06-23 DIAGNOSIS — J449 Chronic obstructive pulmonary disease, unspecified: Secondary | ICD-10-CM | POA: Diagnosis not present

## 2016-07-08 ENCOUNTER — Emergency Department (HOSPITAL_COMMUNITY): Payer: PPO

## 2016-07-08 ENCOUNTER — Encounter (HOSPITAL_COMMUNITY): Payer: Self-pay | Admitting: Emergency Medicine

## 2016-07-08 ENCOUNTER — Inpatient Hospital Stay (HOSPITAL_COMMUNITY)
Admission: EM | Admit: 2016-07-08 | Discharge: 2016-07-10 | DRG: 192 | Disposition: A | Discharge status: Home Health | Payer: PPO | Attending: Internal Medicine | Admitting: Internal Medicine

## 2016-07-08 DIAGNOSIS — Z832 Family history of diseases of the blood and blood-forming organs and certain disorders involving the immune mechanism: Secondary | ICD-10-CM | POA: Diagnosis not present

## 2016-07-08 DIAGNOSIS — Z820 Family history of epilepsy and other diseases of the nervous system: Secondary | ICD-10-CM | POA: Diagnosis not present

## 2016-07-08 DIAGNOSIS — C8207 Follicular lymphoma grade I, spleen: Secondary | ICD-10-CM | POA: Diagnosis not present

## 2016-07-08 DIAGNOSIS — I10 Essential (primary) hypertension: Secondary | ICD-10-CM | POA: Diagnosis not present

## 2016-07-08 DIAGNOSIS — Z794 Long term (current) use of insulin: Secondary | ICD-10-CM | POA: Diagnosis not present

## 2016-07-08 DIAGNOSIS — Z90710 Acquired absence of both cervix and uterus: Secondary | ICD-10-CM | POA: Diagnosis not present

## 2016-07-08 DIAGNOSIS — Z9841 Cataract extraction status, right eye: Secondary | ICD-10-CM

## 2016-07-08 DIAGNOSIS — Z823 Family history of stroke: Secondary | ICD-10-CM

## 2016-07-08 DIAGNOSIS — R627 Adult failure to thrive: Secondary | ICD-10-CM | POA: Diagnosis present

## 2016-07-08 DIAGNOSIS — Z923 Personal history of irradiation: Secondary | ICD-10-CM

## 2016-07-08 DIAGNOSIS — Z841 Family history of disorders of kidney and ureter: Secondary | ICD-10-CM | POA: Diagnosis not present

## 2016-07-08 DIAGNOSIS — M1990 Unspecified osteoarthritis, unspecified site: Secondary | ICD-10-CM | POA: Diagnosis present

## 2016-07-08 DIAGNOSIS — J441 Chronic obstructive pulmonary disease with (acute) exacerbation: Principal | ICD-10-CM | POA: Diagnosis present

## 2016-07-08 DIAGNOSIS — Z7982 Long term (current) use of aspirin: Secondary | ICD-10-CM | POA: Diagnosis not present

## 2016-07-08 DIAGNOSIS — E785 Hyperlipidemia, unspecified: Secondary | ICD-10-CM | POA: Diagnosis present

## 2016-07-08 DIAGNOSIS — M810 Age-related osteoporosis without current pathological fracture: Secondary | ICD-10-CM | POA: Diagnosis not present

## 2016-07-08 DIAGNOSIS — Z8249 Family history of ischemic heart disease and other diseases of the circulatory system: Secondary | ICD-10-CM

## 2016-07-08 DIAGNOSIS — R51 Headache: Secondary | ICD-10-CM | POA: Diagnosis not present

## 2016-07-08 DIAGNOSIS — Z66 Do not resuscitate: Secondary | ICD-10-CM | POA: Diagnosis present

## 2016-07-08 DIAGNOSIS — R448 Other symptoms and signs involving general sensations and perceptions: Secondary | ICD-10-CM | POA: Diagnosis not present

## 2016-07-08 DIAGNOSIS — Z9842 Cataract extraction status, left eye: Secondary | ICD-10-CM | POA: Diagnosis not present

## 2016-07-08 DIAGNOSIS — E119 Type 2 diabetes mellitus without complications: Secondary | ICD-10-CM | POA: Diagnosis not present

## 2016-07-08 DIAGNOSIS — C8590 Non-Hodgkin lymphoma, unspecified, unspecified site: Secondary | ICD-10-CM | POA: Diagnosis present

## 2016-07-08 DIAGNOSIS — R05 Cough: Secondary | ICD-10-CM | POA: Diagnosis not present

## 2016-07-08 DIAGNOSIS — N289 Disorder of kidney and ureter, unspecified: Secondary | ICD-10-CM | POA: Diagnosis not present

## 2016-07-08 DIAGNOSIS — R0602 Shortness of breath: Secondary | ICD-10-CM | POA: Diagnosis not present

## 2016-07-08 DIAGNOSIS — Z961 Presence of intraocular lens: Secondary | ICD-10-CM | POA: Diagnosis not present

## 2016-07-08 DIAGNOSIS — F419 Anxiety disorder, unspecified: Secondary | ICD-10-CM | POA: Diagnosis present

## 2016-07-08 DIAGNOSIS — K5790 Diverticulosis of intestine, part unspecified, without perforation or abscess without bleeding: Secondary | ICD-10-CM | POA: Diagnosis present

## 2016-07-08 DIAGNOSIS — N2889 Other specified disorders of kidney and ureter: Secondary | ICD-10-CM | POA: Diagnosis not present

## 2016-07-08 DIAGNOSIS — Z9049 Acquired absence of other specified parts of digestive tract: Secondary | ICD-10-CM | POA: Diagnosis not present

## 2016-07-08 DIAGNOSIS — K219 Gastro-esophageal reflux disease without esophagitis: Secondary | ICD-10-CM | POA: Diagnosis not present

## 2016-07-08 DIAGNOSIS — F17210 Nicotine dependence, cigarettes, uncomplicated: Secondary | ICD-10-CM | POA: Diagnosis not present

## 2016-07-08 DIAGNOSIS — Z882 Allergy status to sulfonamides status: Secondary | ICD-10-CM

## 2016-07-08 DIAGNOSIS — Z6824 Body mass index (BMI) 24.0-24.9, adult: Secondary | ICD-10-CM

## 2016-07-08 DIAGNOSIS — Z85118 Personal history of other malignant neoplasm of bronchus and lung: Secondary | ICD-10-CM

## 2016-07-08 DIAGNOSIS — Z8572 Personal history of non-Hodgkin lymphomas: Secondary | ICD-10-CM | POA: Diagnosis not present

## 2016-07-08 DIAGNOSIS — F418 Other specified anxiety disorders: Secondary | ICD-10-CM | POA: Diagnosis not present

## 2016-07-08 DIAGNOSIS — IMO0001 Reserved for inherently not codable concepts without codable children: Secondary | ICD-10-CM

## 2016-07-08 DIAGNOSIS — Z888 Allergy status to other drugs, medicaments and biological substances status: Secondary | ICD-10-CM

## 2016-07-08 LAB — CBC WITH DIFFERENTIAL/PLATELET
Basophils Absolute: 0 10*3/uL (ref 0.0–0.1)
Basophils Relative: 1 %
EOS ABS: 0.2 10*3/uL (ref 0.0–0.7)
EOS PCT: 3 %
HCT: 41 % (ref 36.0–46.0)
HEMOGLOBIN: 13.7 g/dL (ref 12.0–15.0)
LYMPHS ABS: 2.1 10*3/uL (ref 0.7–4.0)
LYMPHS PCT: 29 %
MCH: 30.1 pg (ref 26.0–34.0)
MCHC: 33.4 g/dL (ref 30.0–36.0)
MCV: 90.1 fL (ref 78.0–100.0)
MONOS PCT: 9 %
Monocytes Absolute: 0.6 10*3/uL (ref 0.1–1.0)
Neutro Abs: 4.1 10*3/uL (ref 1.7–7.7)
Neutrophils Relative %: 58 %
PLATELETS: 319 10*3/uL (ref 150–400)
RBC: 4.55 MIL/uL (ref 3.87–5.11)
RDW: 13 % (ref 11.5–15.5)
WBC: 7 10*3/uL (ref 4.0–10.5)

## 2016-07-08 LAB — COMPREHENSIVE METABOLIC PANEL
ALBUMIN: 3.4 g/dL — AB (ref 3.5–5.0)
ALK PHOS: 79 U/L (ref 38–126)
ALT: 14 U/L (ref 14–54)
AST: 19 U/L (ref 15–41)
Anion gap: 8 (ref 5–15)
BILIRUBIN TOTAL: 0.7 mg/dL (ref 0.3–1.2)
BUN: 18 mg/dL (ref 6–20)
CALCIUM: 9.2 mg/dL (ref 8.9–10.3)
CO2: 28 mmol/L (ref 22–32)
Chloride: 102 mmol/L (ref 101–111)
Creatinine, Ser: 0.96 mg/dL (ref 0.44–1.00)
GFR calc Af Amer: >60 mL/min (ref >60)
GFR calc non Af Amer: 55 mL/min — ABNORMAL LOW (ref >60)
GLUCOSE: 272 mg/dL — AB (ref 65–99)
Potassium: 4.2 mmol/L (ref 3.5–5.1)
SODIUM: 138 mmol/L (ref 135–145)
TOTAL PROTEIN: 6.3 g/dL — AB (ref 6.5–8.1)

## 2016-07-08 LAB — GLUCOSE, CAPILLARY: GLUCOSE-CAPILLARY: 161 mg/dL — AB (ref 65–99)

## 2016-07-08 LAB — CBG MONITORING, ED: GLUCOSE-CAPILLARY: 234 mg/dL — AB (ref 65–99)

## 2016-07-08 LAB — TROPONIN I: Troponin I: <0.03 ng/mL (ref <0.03)

## 2016-07-08 LAB — BRAIN NATRIURETIC PEPTIDE: B Natriuretic Peptide: 49 pg/mL (ref 0.0–100.0)

## 2016-07-08 MED ORDER — SODIUM CHLORIDE 0.9 % IV SOLN: 250.0000 mL | INTRAVENOUS | Status: DC | PRN

## 2016-07-08 MED ORDER — ALBUTEROL SULFATE (2.5 MG/3ML) 0.083% IN NEBU: 5.0000 mg | INHALATION_SOLUTION | Freq: Once | RESPIRATORY_TRACT | Status: DC

## 2016-07-08 MED ORDER — MOMETASONE FURO-FORMOTEROL FUM 200-5 MCG/ACT IN AERO
2.0000 | INHALATION_SPRAY | Freq: Two times a day (BID) | RESPIRATORY_TRACT | Status: DC
  Administered 2016-07-08 – 2016-07-10 (×4): 2 via RESPIRATORY_TRACT
  Filled 2016-07-08: qty 8.8

## 2016-07-08 MED ORDER — HYDROCODONE-ACETAMINOPHEN 5-325 MG PO TABS
1.0000 | ORAL_TABLET | ORAL | Status: DC | PRN
  Administered 2016-07-08 – 2016-07-09 (×4): 1 via ORAL
  Administered 2016-07-09: 2 via ORAL
  Administered 2016-07-10: 1 via ORAL
  Administered 2016-07-10: 2 via ORAL
  Filled 2016-07-08: qty 2
  Filled 2016-07-08 (×5): qty 1
  Filled 2016-07-08: qty 2

## 2016-07-08 MED ORDER — INSULIN ASPART 100 UNIT/ML ~~LOC~~ SOLN
0.0000 [IU] | Freq: Three times a day (TID) | SUBCUTANEOUS | Status: DC
  Administered 2016-07-09: 8 [IU] via SUBCUTANEOUS
  Administered 2016-07-09: 5 [IU] via SUBCUTANEOUS
  Administered 2016-07-09: 8 [IU] via SUBCUTANEOUS
  Administered 2016-07-10: 5 [IU] via SUBCUTANEOUS
  Administered 2016-07-10: 2 [IU] via SUBCUTANEOUS

## 2016-07-08 MED ORDER — ALPRAZOLAM 1 MG PO TABS
1.0000 mg | ORAL_TABLET | Freq: Every day | ORAL | Status: DC
  Administered 2016-07-08 – 2016-07-09 (×2): 1 mg via ORAL
  Filled 2016-07-08 (×2): qty 1

## 2016-07-08 MED ORDER — VITAMIN B-12 1000 MCG PO TABS
1000.0000 ug | ORAL_TABLET | Freq: Every day | ORAL | Status: DC
  Administered 2016-07-09 – 2016-07-10 (×2): 1000 ug via ORAL
  Filled 2016-07-08 (×2): qty 1

## 2016-07-08 MED ORDER — CITALOPRAM HYDROBROMIDE 20 MG PO TABS
ORAL_TABLET | ORAL | Status: AC
  Filled 2016-07-08: qty 2

## 2016-07-08 MED ORDER — ALPRAZOLAM 0.5 MG PO TABS
0.5000 mg | ORAL_TABLET | Freq: Every day | ORAL | Status: DC
  Administered 2016-07-09 – 2016-07-10 (×2): 0.5 mg via ORAL
  Filled 2016-07-08 (×2): qty 1

## 2016-07-08 MED ORDER — BISACODYL 5 MG PO TBEC
5.0000 mg | DELAYED_RELEASE_TABLET | Freq: Every day | ORAL | Status: DC | PRN
  Administered 2016-07-10: 5 mg via ORAL
  Filled 2016-07-08: qty 1

## 2016-07-08 MED ORDER — ALPRAZOLAM 0.5 MG PO TABS: 0.5000 mg | ORAL_TABLET | Freq: Two times a day (BID) | ORAL | Status: DC

## 2016-07-08 MED ORDER — MORPHINE SULFATE (PF) 4 MG/ML IV SOLN
1.0000 mg | INTRAVENOUS | Status: DC | PRN
  Administered 2016-07-09: 1 mg via INTRAVENOUS
  Administered 2016-07-09: 3 mg via INTRAVENOUS
  Administered 2016-07-09 – 2016-07-10 (×2): 1 mg via INTRAVENOUS
  Filled 2016-07-08 (×4): qty 1

## 2016-07-08 MED ORDER — ENOXAPARIN SODIUM 40 MG/0.4ML ~~LOC~~ SOLN
40.0000 mg | SUBCUTANEOUS | Status: DC
  Administered 2016-07-08 – 2016-07-09 (×2): 40 mg via SUBCUTANEOUS
  Filled 2016-07-08 (×2): qty 0.4

## 2016-07-08 MED ORDER — DEXTROSE 5 % IV SOLN
500.0000 mg | INTRAVENOUS | Status: DC
  Administered 2016-07-08 – 2016-07-09 (×2): 500 mg via INTRAVENOUS
  Filled 2016-07-08 (×3): qty 500

## 2016-07-08 MED ORDER — IPRATROPIUM-ALBUTEROL 0.5-2.5 (3) MG/3ML IN SOLN: 3.0000 mL | RESPIRATORY_TRACT | Status: DC | PRN

## 2016-07-08 MED ORDER — IPRATROPIUM-ALBUTEROL 0.5-2.5 (3) MG/3ML IN SOLN
3.0000 mL | Freq: Once | RESPIRATORY_TRACT | Status: AC
  Administered 2016-07-08: 3 mL via RESPIRATORY_TRACT
  Filled 2016-07-08: qty 3

## 2016-07-08 MED ORDER — IPRATROPIUM BROMIDE 0.02 % IN SOLN: 0.5000 mg | Freq: Once | RESPIRATORY_TRACT | Status: DC

## 2016-07-08 MED ORDER — POLYETHYLENE GLYCOL 3350 17 G PO PACK: 17.0000 g | PACK | Freq: Every day | ORAL | Status: DC | PRN

## 2016-07-08 MED ORDER — ALBUTEROL SULFATE (2.5 MG/3ML) 0.083% IN NEBU
2.5000 mg | INHALATION_SOLUTION | Freq: Once | RESPIRATORY_TRACT | Status: AC
  Administered 2016-07-08: 2.5 mg via RESPIRATORY_TRACT
  Filled 2016-07-08: qty 3

## 2016-07-08 MED ORDER — ONDANSETRON HCL 4 MG PO TABS: 4.0000 mg | ORAL_TABLET | Freq: Four times a day (QID) | ORAL | Status: DC | PRN

## 2016-07-08 MED ORDER — ASPIRIN EC 81 MG PO TBEC
81.0000 mg | DELAYED_RELEASE_TABLET | Freq: Every morning | ORAL | Status: DC
  Administered 2016-07-09 – 2016-07-10 (×2): 81 mg via ORAL
  Filled 2016-07-08 (×2): qty 1

## 2016-07-08 MED ORDER — ACETAMINOPHEN 325 MG PO TABS: 650.0000 mg | ORAL_TABLET | Freq: Four times a day (QID) | ORAL | Status: DC | PRN

## 2016-07-08 MED ORDER — DEXTROSE 5 % IV SOLN
INTRAVENOUS | Status: AC
  Filled 2016-07-08: qty 500

## 2016-07-08 MED ORDER — PROMETHAZINE HCL 25 MG/ML IJ SOLN
6.2500 mg | Freq: Once | INTRAMUSCULAR | Status: AC
  Administered 2016-07-08: 6.25 mg via INTRAVENOUS
  Filled 2016-07-08: qty 1

## 2016-07-08 MED ORDER — SODIUM CHLORIDE 0.9% FLUSH: 3.0000 mL | INTRAVENOUS | Status: DC | PRN

## 2016-07-08 MED ORDER — ACETAMINOPHEN 650 MG RE SUPP: 650.0000 mg | Freq: Four times a day (QID) | RECTAL | Status: DC | PRN

## 2016-07-08 MED ORDER — INSULIN ASPART 100 UNIT/ML ~~LOC~~ SOLN: 0.0000 [IU] | Freq: Every day | SUBCUTANEOUS | Status: DC

## 2016-07-08 MED ORDER — INSULIN ASPART 100 UNIT/ML ~~LOC~~ SOLN
7.0000 [IU] | Freq: Once | SUBCUTANEOUS | Status: AC
  Administered 2016-07-08: 7 [IU] via SUBCUTANEOUS

## 2016-07-08 MED ORDER — LOSARTAN POTASSIUM 50 MG PO TABS
50.0000 mg | ORAL_TABLET | Freq: Every day | ORAL | Status: DC
  Administered 2016-07-09 – 2016-07-10 (×2): 50 mg via ORAL
  Filled 2016-07-08 (×2): qty 1

## 2016-07-08 MED ORDER — ONDANSETRON HCL 4 MG/2ML IJ SOLN
4.0000 mg | Freq: Four times a day (QID) | INTRAMUSCULAR | Status: DC | PRN
  Administered 2016-07-09 – 2016-07-10 (×2): 4 mg via INTRAVENOUS
  Filled 2016-07-08 (×2): qty 2

## 2016-07-08 MED ORDER — SODIUM CHLORIDE 0.9% FLUSH
3.0000 mL | Freq: Two times a day (BID) | INTRAVENOUS | Status: DC
  Administered 2016-07-08 – 2016-07-10 (×4): 3 mL via INTRAVENOUS

## 2016-07-08 MED ORDER — MOMETASONE FURO-FORMOTEROL FUM 200-5 MCG/ACT IN AERO
INHALATION_SPRAY | RESPIRATORY_TRACT | Status: AC
  Filled 2016-07-08: qty 8.8

## 2016-07-08 MED ORDER — CITALOPRAM HYDROBROMIDE 20 MG PO TABS
40.0000 mg | ORAL_TABLET | Freq: Every evening | ORAL | Status: DC
  Administered 2016-07-08 – 2016-07-09 (×2): 40 mg via ORAL
  Filled 2016-07-08 (×2): qty 1
  Filled 2016-07-08: qty 2
  Filled 2016-07-08: qty 1

## 2016-07-08 MED ORDER — METOPROLOL SUCCINATE ER 25 MG PO TB24
25.0000 mg | ORAL_TABLET | Freq: Two times a day (BID) | ORAL | Status: DC
  Administered 2016-07-09 – 2016-07-10 (×3): 25 mg via ORAL
  Filled 2016-07-08 (×4): qty 1

## 2016-07-08 MED ORDER — INSULIN GLARGINE 100 UNIT/ML ~~LOC~~ SOLN
15.0000 [IU] | Freq: Every day | SUBCUTANEOUS | Status: DC
  Administered 2016-07-09 – 2016-07-10 (×2): 15 [IU] via SUBCUTANEOUS
  Filled 2016-07-08 (×3): qty 0.15

## 2016-07-08 MED ORDER — METHYLPREDNISOLONE SODIUM SUCC 125 MG IJ SOLR
60.0000 mg | Freq: Three times a day (TID) | INTRAMUSCULAR | Status: DC
  Administered 2016-07-08 – 2016-07-09 (×3): 60 mg via INTRAVENOUS
  Filled 2016-07-08 (×3): qty 2

## 2016-07-08 NOTE — ED Notes (Signed)
Hospitalist at bedside 

## 2016-07-08 NOTE — ED Notes (Signed)
Pt back from x-ray.

## 2016-07-08 NOTE — H&P (Signed)
History and Physical    Marie Holmes JJO:[id number] DOB: [birth year] DOA: 07/08/2016  PCP: Celene Squibb, MD   Patient coming from: Home  Chief Complaint: Cough, SOB, fatigue   HPI: Marie Holmes is a 81 y.o. female with medical history significant for COPD, hypertension, insulin-dependent diabetes mellitus, non-Hodgkin's lymphoma status post radiation, and depression with anxiety who presents to the emergency department for evaluation of progressive dyspnea, productive cough, and lethargy. Patient is accompanied by her daughter who assists with the history. The patient had reportedly been fairly stable until she noted the insidious worsening in her respiratory status over the past month, accompanied by progressive fatigue. Patient denies any fevers or chills, but notes that she has become increasingly dyspneic, worsening acutely over the past 2 days, and with cough productive of scant thick sputum. She reports history of chest pain, but denies any currently or within the last several days. She denies palpitations. She denies lower extremity swelling or orthopnea. She denies any change in vision or hearing or focal numbness or weakness, but reports a generalized weakness and lethargy, noting that she only feels like lying down and lacks the energy to even sit on the couch and watch her TV show. This fatigue has been progressing over the past month or so. She has also been complaining of right flank pain, mild to moderate in intensity, I'll and aching in character, localized to the right flank, and with no appreciable alleviating or exacerbating factors. This was evaluated by her PCP with CT of the abdomen and pelvis on 06/22/2016, but she has not yet followed up to review the findings.  ED Course: Upon arrival to the ED, patient is found to be afebrile, desaturating into the 80s on room air, tachypneic, and with vitals otherwise stable. EKG features a sinus rhythm with inferior Q waves, chest x-ray is  notable for chronic COPD changes, but no acute cardiopulmonary disease. Noncontrast head CT is negative for acute intracranial abnormality. Chemistry panels notable for serum glucose 272. CBC is unremarkable, troponin is undetectable, and BNP is within the normal limits. Patient was treated with DuoNeb and Phenergan in the emergency department. She remains dyspneic while at rest and quite symptomatic with minimal exertion such as shifting in the bed. She will be admitted to the medical surgical unit for ongoing evaluation of acute exacerbation and COPD, as well as progressive lethargy, possibly related to right renal mass on recent CT suggestive of recurrence in lymphoma.  Review of Systems:  All other systems reviewed and apart from HPI, are negative.  Past Medical History:  Diagnosis Date  . Adenomatous colon polyp   . Anxiety   . Benign tumor of breast 2004   left  . Broken ribs    history of several broken ribs on right and compression  fx  . C. difficile colitis 06/21/2014  . Cancer ([place])    non-hodgkins b cell lymphoma  . CHF (congestive heart failure) ([place])    hosp. for CHF- 10/2013  . Chronic abdominal pain   . Cold virus    seen by Dr. Deborra Medina on 10/12, given Rx for antibiotic & steroid  . COPD (chronic obstructive pulmonary disease) ([place])   . Depression   . Diabetes mellitus without complication ([place])   . Diverticulosis   . Fibroma of skin    Multiple fibromas/lipomas on arms and legs B  . Gait instability   . GERD (gastroesophageal reflux disease)   . History of CT scan of abdomen  05/23/2003   Mild fatty liver inf, tiny hypodensity in right lobe of liver  . Hx of radiation therapy 04/11/12- 05/08/12   left chest wall area, 40 gray 20 fx  . Hyperlipidemia   . Hypertension   . Kidney stone 02/21/1981   Right/removed  . Lung cancer ([place])    pulmonary lymphoid neoplasm questionable for non-Hodgkins lymphoma  . Osteoarthritis    hands, degenerative back , osteoporosis   .  Palpitations   . Pulmonary nodules   . Pyelonephritis   . Shortness of breath    only with movemnent   . Tremor   . UTI (lower urinary tract infection)   . Weight loss    "multifactorial" per PMD note    Past Surgical History:  Procedure Laterality Date  . ABDOMINAL HYSTERECTOMY  02/21/1970   right ovary remaining  . APPENDECTOMY  02/21/1961  . BONE MARROW BIOPSY  03/09/2012  . BREAST SURGERY  1970's   lump removed- benign   . CHOLECYSTECTOMY N/A 12/11/2013   Procedure: LAPAROSCOPIC CHOLECYSTECTOMY WITH INTRAOPERATIVE CHOLANGIOGRAM;  Surgeon: Erroll Luna, MD;  Location: [place];  Service: General;  Laterality: N/A;  . EXPLORATORY LAPAROTOMY  02/21/1970   for ? adhesions  . EYE SURGERY     bilateral cataracts removed - /W- IOL  . lung core biopsy  02/06/2012  . MASS EXCISION Right 04/21/2015   Procedure: EXCISION RIGHT THIGH  MASS;  Surgeon: Erroll Luna, MD;  Location: [place];  Service: General;  Laterality: Right;  . TONSILLECTOMY    . TUBAL LIGATION       reports that she has been smoking Cigarettes.  She has a 7.50 pack-year smoking history. She has never used smokeless tobacco. She reports that she does not drink alcohol or use drugs.  Allergies  Allergen Reactions  . Sulfonamide Derivatives Anaphylaxis  . Metformin And Related Diarrhea    Family History  Problem Relation Age of Onset  . Parkinsonism Brother   . Kidney disease Mother   . Stroke Father   . Clotting disorder Father   . Cancer Sister   . Heart disease Brother      Prior to Admission medications   Medication Sig Start Date End Date Taking? Authorizing Provider  albuterol (PROVENTIL HFA;VENTOLIN HFA) 108 (90 BASE) MCG/ACT inhaler Inhale 2 puffs into the lungs every 6 (six) hours as needed for wheezing or shortness of breath. 12/25/14  Yes Isaac Bliss, Rayford Halsted, MD  albuterol (PROVENTIL) (2.5 MG/3ML) 0.083% nebulizer solution Take 3 mLs (2.5 mg total) by nebulization every 4  (four) hours as needed for wheezing or shortness of breath. 07/25/15  Yes Kathie Dike, MD  ALPRAZolam Duanne Moron) 0.5 MG tablet Take 0.5-1 mg by mouth 2 (two) times daily. One tablet daily and two at bedtime 06/14/16  Yes [provider]  aspirin EC 81 MG tablet Take 81 mg by mouth every morning.   Yes [provider]  budesonide-formoterol (SYMBICORT) 160-4.5 MCG/ACT inhaler Inhale 2 puffs into the lungs 2 (two) times daily. Patient taking differently: Inhale 2 puffs into the lungs daily as needed (for shortness of breath).  12/25/14  Yes Isaac Bliss, Rayford Halsted, MD  citalopram (CELEXA) 40 MG tablet Take 1 tablet (40 mg total) by mouth daily. Patient taking differently: Take 40 mg by mouth every evening.  03/05/15  Yes Lucille Passy, MD  losartan (COZAAR) 50 MG tablet TAKE ONE TABLET BY MOUTH ONCE DAILY IN THE MORNING 07/21/15  Yes Lucille Passy,  MD  metoprolol succinate (TOPROL-XL) 25 MG 24 hr tablet Take 25 mg by mouth 2 (two) times daily. 06/14/16  Yes [provider]  TRESIBA FLEXTOUCH 200 UNIT/ML SOPN Inject 15 Units into the skin every morning.  06/08/16  Yes [provider]  vitamin B-12 (CYANOCOBALAMIN) 500 MCG tablet Take 1,000 mcg by mouth daily.    Yes [provider]  Blood Glucose Monitoring Suppl ([place]) W/DEVICE KIT Use as directed to test blood sugar three times daily Dx:R73.09 05/01/14   Lucille Passy, MD  Insulin Pen Needle 32G X 4 MM MISC Use to inject 4x daily. Relion Brand 02/10/15   Philemon Kingdom, MD  Insulin Syringe-Needle U-100 (INSULIN SYRINGE .3CC/31GX5/16") 31G X 5/16" 0.3 ML MISC Use to inject insulin 3 times daily as instructed. 04/07/15   Philemon Kingdom, MD  ONE [facility] LANCETS MISC Use as directed to test blood sugar three times daily Dx:R73.09 01/01/15   Lucille Passy, MD  ONE [facility] ULTRA TEST test strip USE ONE STRIP TO CHECK GLUCOSE THREE TIMES DAILY 03/03/15   Philemon Kingdom, MD    Physical  Exam: Vitals:   07/08/16 1715 07/08/16 1730 07/08/16 1800 07/08/16 1830  BP:  (!) 131/53 (!) 112/50 125/62  Pulse:   96 88  Resp:  (!) 23 (!) 25 (!) 23  Temp:      TempSrc:      SpO2: 93%  94% 97%  Weight:      Height:          Constitutional: Mild tachypnea, no pallor. Appears uncomfortable.  Eyes: PERTLA, lids and conjunctivae normal ENMT: Mucous membranes are moist. Posterior pharynx clear of any exudate or lesions.   Neck: normal, supple, no masses, no thyromegaly Respiratory: Breath sounds diminished bilaterally. Expiratory wheezes diffusely. Increased WOB. No crackles or ronchi. No pallor or cyanosis.   Cardiovascular: S1 & S2 heard, regular rate and rhythm. No extremity edema. No significant JVD. Abdomen: No distension, no tenderness, soft. Bowel sounds active.  Musculoskeletal: no clubbing / cyanosis. No joint deformity upper and lower extremities.  Skin: no significant rashes, lesions, ulcers. Warm, dry, well-perfused. Neurologic: CN 2-12 grossly intact. Sensation intact, DTR normal. Strength 5/5 in all 4 limbs.  Psychiatric: Alert and oriented x 3. Calm, cooperative.     Labs on Admission: I have personally reviewed following labs and imaging studies  CBC:  Recent Labs Lab 07/08/16 1536  WBC 7.0  NEUTROABS 4.1  HGB 13.7  HCT 41.0  MCV 90.1  PLT 097   Basic Metabolic Panel:  Recent Labs Lab 07/08/16 1536  NA 138  K 4.2  CL 102  CO2 28  GLUCOSE 272*  BUN 18  CREATININE 0.96  CALCIUM 9.2   GFR: Estimated Creatinine Clearance: 37.5 mL/min (by C-G formula based on SCr of 0.96 mg/dL). Liver Function Tests:  Recent Labs Lab 07/08/16 1536  AST 19  ALT 14  ALKPHOS 79  BILITOT 0.7  PROT 6.3*  ALBUMIN 3.4*   No results for input(s): LIPASE, AMYLASE in the last 168 hours. No results for input(s): AMMONIA in the last 168 hours. Coagulation Profile: No results for input(s): INR, PROTIME in the last 168 hours. Cardiac Enzymes:  Recent Labs Lab  07/08/16 1536  TROPONINI <0.03   BNP (last 3 results) No results for input(s): PROBNP in the last 8760 hours. HbA1C: No results for input(s): HGBA1C in the last 72 hours. CBG:  Recent Labs Lab 07/08/16 1538  GLUCAP 234*  Lipid Profile: No results for input(s): CHOL, HDL, LDLCALC, TRIG, CHOLHDL, LDLDIRECT in the last 72 hours. Thyroid Function Tests: No results for input(s): TSH, T4TOTAL, FREET4, T3FREE, THYROIDAB in the last 72 hours. Anemia Panel: No results for input(s): VITAMINB12, FOLATE, FERRITIN, TIBC, IRON, RETICCTPCT in the last 72 hours. Urine analysis:    Component Value Date/Time   COLORURINE YELLOW 07/20/2015 0530   APPEARANCEUR HAZY (A) 07/20/2015 0530   LABSPEC 1.020 07/20/2015 0530   PHURINE 5.5 07/20/2015 0530   GLUCOSEU >1000 (A) 07/20/2015 0530   HGBUR NEGATIVE 07/20/2015 0530   BILIRUBINUR NEGATIVE 07/20/2015 0530   BILIRUBINUR neg 03/05/2015 0944   KETONESUR NEGATIVE 07/20/2015 0530   PROTEINUR NEGATIVE 07/20/2015 0530   UROBILINOGEN 0.2 03/05/2015 0944   UROBILINOGEN 0.2 10/25/2014 1839   NITRITE NEGATIVE 07/20/2015 0530   LEUKOCYTESUR NEGATIVE 07/20/2015 0530   Sepsis Labs: @LABRCNTIP (procalcitonin:4,lacticidven:4) )No results found for this or any previous visit (from the past 240 hour(s)).   Radiological Exams on Admission: Dg Chest 2 View  Result Date: 07/08/2016 CLINICAL DATA:  Initial evaluation for acute shortness of breath. History of COPD. EXAM: CHEST  2 VIEW COMPARISON:  Prior radiograph from 03/14/2016. FINDINGS: The cardiac and mediastinal silhouettes are stable in size and contour, and remain within normal limits. Aortic atherosclerosis. The lungs are mildly hyperinflated. Changes related COPD. No airspace consolidation, pleural effusion, or pulmonary edema is identified. There is no pneumothorax. No acute osseous abnormality identified.  Osteopenia noted. IMPRESSION: 1. COPD.  No other active cardiopulmonary disease. 2. Aortic  atherosclerosis. Electronically Signed   By: Jeannine Boga M.D.   On: 07/08/2016 16:02   Ct Head Wo Contrast  Result Date: 07/08/2016 CLINICAL DATA:  Patient states she choked on water today with coughing and near syncopal episode today with a dull headache for 1 week. EXAM: CT HEAD WITHOUT CONTRAST TECHNIQUE: Contiguous axial images were obtained from the base of the skull through the vertex without intravenous contrast. COMPARISON:  Brain MRI, 08/14/2014 FINDINGS: Brain: No evidence of acute infarction, hemorrhage, hydrocephalus, extra-axial collection or mass lesion/mass effect. Age related volume loss and mild chronic microvascular ischemic change stable from the prior brain MRI. Vascular: No hyperdense vessel or unexpected calcification. Skull: Normal. Negative for fracture or focal lesion. Sinuses/Orbits: Globes orbits are unremarkable. Visualized sinuses and mastoid air cells are clear. Other: None. IMPRESSION: 1. No acute intracranial abnormalities. 2. Age related volume loss. Mild chronic microvascular ischemic change. Electronically Signed   By: Lajean Manes M.D.   On: 07/08/2016 18:08    EKG: Independently reviewed. Sinus rhythm, inferior Q-wave.   Assessment/Plan  1. COPD with acute exacerbation  - Pt presents with progressive SOB and productive cough, found to have new supplemental O2-requirement and wheezes  - Afebrile, no leukocytosis, CXR clear; no evidence for VTE or CHF  - Improved some with nebs in ED, but continues to be symptomatic at rest  - Plan to continue ICS/LABA, duoneb, check sputum culture, start systemic steroid and azithromycin  - Continue supplemental O2 prn    2. Right renal mass  - Pt saw PCP for right flank pain and CT abd/pelvis obtained on 06/22/16  - CT reveals right kidney lesion most concerning for recurrent lymphoma  - She has followed by Dr. Julien Nordmann of oncology for hx of lymphoma s/p chest radiation in 2014; she has been on observation only  - May  need PET as next step; will ask oncology for routine consultation    3. Failure to thrive  -  Pt reports progressive lethargy, worsening over the past month  - No focal neurologic deficits, but generally weak and fatigued, unable to get out of bed without intensive assistance and no longer to attend to basic needs  - Progression of her advanced COPD may play a role, but there is also concern for a malignant process as noted above  - PT eval requested   4. Insulin-dependent DM  - A1c was 6.9% in February 2017; serum glucose 272 on admission  - She will be on systemic steroid for COPD exacerbation  - Managed at home with Tresiba 15 units qD  - Check CBG with meals and qHS  - Start Lantus 15 units qAM with moderate-intensity Novolog sliding-scale    5. Hypertension  - BP at goal  - Continue Toprol and losartan     DVT prophylaxis: sq Lovenox Code Status: Partial  Family Communication: Daughter updated at bedside at pt's request Disposition Plan: Admit to med-surg Consults called: None Admission status: Inpatient    Vianne Bulls, MD [facility] Pager [phone]  If 7PM-7AM, please contact night-coverage [email or web address] Password [email or web address]  07/08/2016, 7:08 PM

## 2016-07-08 NOTE — ED Notes (Signed)
Patient transported to X-ray 

## 2016-07-08 NOTE — ED Triage Notes (Addendum)
Patient complaining of shortness of breath and wheezing x 1 month. Daughter states patient got choked on water prior to arrival to ER. Also complaining of headache x 2 days.

## 2016-07-08 NOTE — ED Provider Notes (Signed)
[facility] DEPT Provider Note   CSN: [id number] Arrival date & time: 07/08/16  1507     History   Chief Complaint Chief Complaint  Patient presents with  . Shortness of Breath    HPI ARANZA GEDDES is a 81 y.o. female.  HPI Patient has shortness of breath worse today but has been somewhat worse over last month or 2. Reported showed on some water today and had more shortness of breath. Has been having some confusion when she wakes up in the morning. Has a history of severe COPD. Not on oxygen at home. However reviewing records at most recent records for COPD admission a few months ago she had a palliative care consult due to the severity of her disease. She is a DO NOT RESUSCITATE. States she does not have a pulmonologist. Has had occasional chest pains. No real fever. Has had a cough with minimal sputum production. Patient's family member states that she gets a little lethargic in the morning.   Past Medical History:  Diagnosis Date  . Adenomatous colon polyp   . Anxiety   . Benign tumor of breast 2004   left  . Broken ribs    history of several broken ribs on right and compression  fx  . C. difficile colitis 06/21/2014  . Cancer ([place])    non-hodgkins b cell lymphoma  . CHF (congestive heart failure) ([place])    hosp. for CHF- 10/2013  . Chronic abdominal pain   . Cold virus    seen by Dr. Deborra Medina on 10/12, given Rx for antibiotic & steroid  . COPD (chronic obstructive pulmonary disease) ([place])   . Depression   . Diabetes mellitus without complication ([place])   . Diverticulosis   . Fibroma of skin    Multiple fibromas/lipomas on arms and legs B  . Gait instability   . GERD (gastroesophageal reflux disease)   . History of CT scan of abdomen 05/23/2003   Mild fatty liver inf, tiny hypodensity in right lobe of liver  . Hx of radiation therapy 04/11/12- 05/08/12   left chest wall area, 40 gray 20 fx  . Hyperlipidemia   . Hypertension   . Kidney stone 02/21/1981   Right/removed  . Lung cancer ([place])    pulmonary lymphoid neoplasm questionable for non-Hodgkins lymphoma  . Osteoarthritis    hands, degenerative back , osteoporosis   . Palpitations   . Pulmonary nodules   . Pyelonephritis   . Shortness of breath    only with movemnent   . Tremor   . UTI (lower urinary tract infection)   . Weight loss    "multifactorial" per PMD note    Patient Active Problem List   Diagnosis Date Noted  . DNR (do not resuscitate) discussion   . Palliative care encounter   . Goals of care, counseling/discussion   . Dyspnea 07/19/2015  . FTT (failure to thrive) in adult 07/19/2015  . Depression with anxiety 07/19/2015  . Acute exacerbation of chronic obstructive pulmonary disease (COPD) ([place]) 07/19/2015  . Angiolipoma 05/04/2015  . Medicare annual wellness visit, subsequent 03/05/2015  . Leg mass 03/05/2015  . Diabetes mellitus, insulin dependent (IDDM), controlled ([place]) 12/23/2014  . Palpitations 09/15/2014  . Loss of weight 07/22/2014  . Tremor 07/22/2014  . Hematuria 07/01/2014  . Chronic anxiety 06/21/2014  . Skin lesions 02/27/2014  . Tobacco abuse 12/26/2013  . Chronic cholecystitis with calculus 12/11/2013  . Nausea alone 11/08/2013  . Gall bladder pain 11/08/2013  . Chest  pain 10/30/2013  . COPD with acute exacerbation ([place]) 10/27/2013  . Bowel incontinence 08/12/2013  . Dysuria 08/12/2013  . Lymphadenitis 06/19/2013  . Back pain 04/11/2013  . Urinary frequency 02/27/2013  . Non Hodgkin's lymphoma ([place]) 11/28/2012  . COLONIC POLYPS 09/24/2008  . HLD (hyperlipidemia) 05/11/2006  . Essential hypertension 05/11/2006  . Venous (peripheral) insufficiency 05/11/2006  . COPD 05/11/2006  . GASTROESOPHAGEAL REFLUX, NO ESOPHAGITIS 04/20/2006  . IRRITABLE BOWEL SYNDROME 04/20/2006  . OSTEOARTHRITIS, MULTI SITES 04/20/2006  . Osteoporosis, unspecified 04/20/2006  . INSOMNIA NOS 04/20/2006  . INCONTINENCE, URGE 04/20/2006    Past Surgical  History:  Procedure Laterality Date  . ABDOMINAL HYSTERECTOMY  02/21/1970   right ovary remaining  . APPENDECTOMY  02/21/1961  . BONE MARROW BIOPSY  03/09/2012  . BREAST SURGERY  1970's   lump removed- benign   . CHOLECYSTECTOMY N/A 12/11/2013   Procedure: LAPAROSCOPIC CHOLECYSTECTOMY WITH INTRAOPERATIVE CHOLANGIOGRAM;  Surgeon: Erroll Luna, MD;  Location: [facility];  Service: General;  Laterality: N/A;  . EXPLORATORY LAPAROTOMY  02/21/1970   for ? adhesions  . EYE SURGERY     bilateral cataracts removed - /W- IOL  . lung core biopsy  02/06/2012  . MASS EXCISION Right 04/21/2015   Procedure: EXCISION RIGHT THIGH  MASS;  Surgeon: Erroll Luna, MD;  Location: [facility];  Service: General;  Laterality: Right;  . TONSILLECTOMY    . TUBAL LIGATION      OB History    No data available       Home Medications    Prior to Admission medications   Medication Sig Start Date End Date Taking? Authorizing Provider  albuterol (PROVENTIL HFA;VENTOLIN HFA) 108 (90 BASE) MCG/ACT inhaler Inhale 2 puffs into the lungs every 6 (six) hours as needed for wheezing or shortness of breath. 12/25/14   Isaac Bliss, Rayford Halsted, MD  albuterol (PROVENTIL) (2.5 MG/3ML) 0.083% nebulizer solution Take 3 mLs (2.5 mg total) by nebulization every 4 (four) hours as needed for wheezing or shortness of breath. 07/25/15   Kathie Dike, MD  ALPRAZolam (XANAX) 0.25 MG tablet TAKE ONE TABLET BY MOUTH THREE TIMES DAILY AS NEEDED FOR ANXIETY Patient taking differently: Take one tablet daily and take two tablets at bedtime 07/08/15   Lucille Passy, MD  aspirin EC 81 MG tablet Take 81 mg by mouth every morning.    [provider]  benzonatate (TESSALON) 100 MG capsule Take 1 capsule (100 mg total) by mouth 3 (three) times daily as needed for cough. 07/25/15   Kathie Dike, MD  Blood Glucose Monitoring Suppl ([place]) W/DEVICE KIT Use as directed to test blood sugar three times daily  Dx:R73.09 05/01/14   Lucille Passy, MD  budesonide-formoterol [facility]) 160-4.5 MCG/ACT inhaler Inhale 2 puffs into the lungs 2 (two) times daily. Patient taking differently: Inhale 2 puffs into the lungs daily as needed (for shortness of breath).  12/25/14   Isaac Bliss, Rayford Halsted, MD  citalopram (CELEXA) 40 MG tablet Take 1 tablet (40 mg total) by mouth daily. Patient taking differently: Take 40 mg by mouth every evening.  03/05/15   Lucille Passy, MD  glipiZIDE (GLUCOTROL XL) 5 MG 24 hr tablet Take 1 tablet (5 mg total) by mouth 2 (two) times daily. 07/25/15   Kathie Dike, MD  guaiFENesin (MUCINEX) 600 MG 12 hr tablet Take 1 tablet (600 mg total) by mouth 2 (two) times daily. 07/25/15   Kathie Dike, MD  HYDROcodone-acetaminophen [facility]) [phone]  MG tablet Take 1 tablet by mouth every 6 (six) hours as needed for moderate pain. Patient not taking: Reported on 07/19/2015 04/21/15   Erroll Luna, MD  insulin aspart (NOVOLOG) 100 UNIT/ML injection Inject 6 Units into the skin 3 (three) times daily before meals. Patient not taking: Reported on 07/19/2015 04/13/15   Philemon Kingdom, MD  Insulin Detemir (LEVEMIR FLEXTOUCH) 100 UNIT/ML Pen Inject 16 Units into the skin daily at 10 pm. Patient taking differently: Inject 18 Units into the skin every morning.  04/15/15   Philemon Kingdom, MD  Insulin Pen Needle 32G X 4 MM MISC Use to inject 4x daily. Relion Brand 02/10/15   Philemon Kingdom, MD  Insulin Syringe-Needle U-100 (INSULIN SYRINGE .3CC/31GX5/16") 31G X 5/16" 0.3 ML MISC Use to inject insulin 3 times daily as instructed. 04/07/15   Philemon Kingdom, MD  losartan (COZAAR) 50 MG tablet TAKE ONE TABLET BY MOUTH ONCE DAILY IN THE MORNING 07/21/15   Lucille Passy, MD  metoprolol succinate (TOPROL-XL) 50 MG 24 hr tablet TAKE ONE TABLET BY MOUTH ONCE DAILY-- TAKE WITH, OR IMMEDIATELY FOLLOWING A MEAL 02/10/15   Lucille Passy, MD  ONE [facility] LANCETS MISC Use as directed to test blood sugar three times  daily Dx:R73.09 01/01/15   Lucille Passy, MD  ONE TOUCH ULTRA TEST test strip USE ONE STRIP TO CHECK GLUCOSE THREE TIMES DAILY 03/03/15   Philemon Kingdom, MD  predniSONE (DELTASONE) 10 MG tablet Take 23m po daily for 2 days then 361mpo daily for 2 days then 2038maily for 2 days then 50m48mily for 2 days then stop 07/25/15   MemoKathie Dike  vitamin B-12 (CYANOCOBALAMIN) 500 MCG tablet Take 500 mcg by mouth daily.    [provider]    Family History Family History  Problem Relation Age of Onset  . Parkinsonism Brother   . Kidney disease Mother   . Stroke Father   . Clotting disorder Father   . Cancer Sister   . Heart disease Brother     Social History Social History  Substance Use Topics  . Smoking status: Current Every Day Smoker    Packs/day: 0.25    Years: 30.00    Types: Cigarettes  . Smokeless tobacco: Never Used     Comment: trying to quit smokes about 2-3 cigs per day, e-cigs with no nicotine  . Alcohol use No     Allergies   Sulfonamide derivatives and Metformin and related   Review of Systems Review of Systems  Constitutional: Positive for appetite change.  HENT: Negative for congestion.   Respiratory: Positive for cough, choking and shortness of breath.   Cardiovascular: Positive for chest pain.  Gastrointestinal: Negative for abdominal pain.  Endocrine: Negative for polyuria.  Genitourinary: Negative for flank pain.  Musculoskeletal: Negative for back pain.  Skin: Negative for rash.  Neurological: Positive for headaches.  Hematological: Negative for adenopathy.  Psychiatric/Behavioral: Negative for confusion.     Physical Exam Updated Vital Signs BP (!) 115/58   Pulse 75   Temp 97.8 F (36.6 C) (Oral)   Resp (!) 22   Ht 5' (1.524 m)   Wt 125 lb (56.7 kg)   SpO2 98%   BMI 24.41 kg/m   Physical Exam  Constitutional: She appears well-developed.  HENT:  Head: Atraumatic.  Eyes: Pupils are equal, round, and reactive to light.    Neck: Neck supple.  Cardiovascular: Normal rate.   Pulmonary/Chest:  Mildly harsh breath sounds with mildly  prolonged expirations.  Abdominal: Soft. There is no tenderness.  Musculoskeletal: She exhibits edema. She exhibits no tenderness.  Mild bilateral extremity pitting edema.  Neurological: She is alert.  Skin: Skin is warm. Capillary refill takes less than 2 seconds.     ED Treatments / Results  Labs (all labs ordered are listed, but only abnormal results are displayed) Labs Reviewed  COMPREHENSIVE METABOLIC PANEL - Abnormal; Notable for the following:       Result Value   Glucose, Bld 272 (*)    Total Protein 6.3 (*)    Albumin 3.4 (*)    GFR calc non Af Amer 55 (*)    All other components within normal limits  CBG MONITORING, ED - Abnormal; Notable for the following:    Glucose-Capillary 234 (*)    All other components within normal limits  BRAIN NATRIURETIC PEPTIDE  TROPONIN I  CBC WITH DIFFERENTIAL/PLATELET    EKG  EKG Interpretation  Date/Time:  Friday Jul 08 2016 15:33:40 EDT Ventricular Rate:  72 PR Interval:    QRS Duration: 73 QT Interval:  418 QTC Calculation: 458 R Axis:   -10 Text Interpretation:  Sinus rhythm Inferior infarct, old Anteroseptal infarct, old Baseline wander in lead(s) V4 new inferior Q waves Confirmed by Alvino Chapel  MD, Rafe Mackowski [phone]) on 07/08/2016 3:56:58 PM       Radiology Dg Chest 2 View  Result Date: 07/08/2016 CLINICAL DATA:  Initial evaluation for acute shortness of breath. History of COPD. EXAM: CHEST  2 VIEW COMPARISON:  Prior radiograph from 03/14/2016. FINDINGS: The cardiac and mediastinal silhouettes are stable in size and contour, and remain within normal limits. Aortic atherosclerosis. The lungs are mildly hyperinflated. Changes related COPD. No airspace consolidation, pleural effusion, or pulmonary edema is identified. There is no pneumothorax. No acute osseous abnormality identified.  Osteopenia noted. IMPRESSION: 1.  COPD.  No other active cardiopulmonary disease. 2. Aortic atherosclerosis. Electronically Signed   By: Jeannine Boga M.D.   On: 07/08/2016 16:02   Ct Head Wo Contrast  Result Date: 07/08/2016 CLINICAL DATA:  Patient states she choked on water today with coughing and near syncopal episode today with a dull headache for 1 week. EXAM: CT HEAD WITHOUT CONTRAST TECHNIQUE: Contiguous axial images were obtained from the base of the skull through the vertex without intravenous contrast. COMPARISON:  Brain MRI, 08/14/2014 FINDINGS: Brain: No evidence of acute infarction, hemorrhage, hydrocephalus, extra-axial collection or mass lesion/mass effect. Age related volume loss and mild chronic microvascular ischemic change stable from the prior brain MRI. Vascular: No hyperdense vessel or unexpected calcification. Skull: Normal. Negative for fracture or focal lesion. Sinuses/Orbits: Globes orbits are unremarkable. Visualized sinuses and mastoid air cells are clear. Other: None. IMPRESSION: 1. No acute intracranial abnormalities. 2. Age related volume loss. Mild chronic microvascular ischemic change. Electronically Signed   By: Lajean Manes M.D.   On: 07/08/2016 18:08    Procedures Procedures (including critical care time)  Medications Ordered in ED Medications  ipratropium-albuterol (DUONEB) 0.5-2.5 (3) MG/3ML nebulizer solution 3 mL (3 mLs Nebulization Given 07/08/16 1618)  albuterol (PROVENTIL) (2.5 MG/3ML) 0.083% nebulizer solution 2.5 mg (2.5 mg Nebulization Given 07/08/16 1617)  promethazine (PHENERGAN) injection 6.25 mg (6.25 mg Intravenous Given 07/08/16 1721)     Initial Impression / Assessment and Plan / ED Course  I have reviewed the triage vital signs and the nursing notes.  Pertinent labs & imaging results that were available during my care of the patient were reviewed by me and considered  in my medical decision making (see chart for details).     Patient has acute on chronic shortness of  breath. Has been going worse for last month or 2. Worse last day or 2 after a choking episode. Will have episodes where her oxygen saturation dipped down into the upper 80s here off oxygen. She is not on oxygen at home. Sugar is mildly elevated. Patient has results of CT scan done as an outpatient. Primary care doctor had ordered CT scan that shows renal mass. Potentially lymphoma. Has been not doing well at home. Will admit to internal medicine for treatment of COPD and evaluation of the renal mass.  Final Clinical Impressions(s) / ED Diagnoses   Final diagnoses:  COPD exacerbation ([place])  Renal mass    New Prescriptions New Prescriptions   No medications on file     Davonna Belling, MD 07/08/16 1827

## 2016-07-09 DIAGNOSIS — J441 Chronic obstructive pulmonary disease with (acute) exacerbation: Principal | ICD-10-CM

## 2016-07-09 DIAGNOSIS — F418 Other specified anxiety disorders: Secondary | ICD-10-CM

## 2016-07-09 DIAGNOSIS — E119 Type 2 diabetes mellitus without complications: Secondary | ICD-10-CM

## 2016-07-09 DIAGNOSIS — I10 Essential (primary) hypertension: Secondary | ICD-10-CM

## 2016-07-09 DIAGNOSIS — R627 Adult failure to thrive: Secondary | ICD-10-CM

## 2016-07-09 DIAGNOSIS — Z794 Long term (current) use of insulin: Secondary | ICD-10-CM

## 2016-07-09 DIAGNOSIS — N2889 Other specified disorders of kidney and ureter: Secondary | ICD-10-CM

## 2016-07-09 LAB — BASIC METABOLIC PANEL
Anion gap: 7 (ref 5–15)
BUN: 23 mg/dL — ABNORMAL HIGH (ref 6–20)
CO2: 29 mmol/L (ref 22–32)
Calcium: 9.1 mg/dL (ref 8.9–10.3)
Chloride: 103 mmol/L (ref 101–111)
Creatinine, Ser: 0.85 mg/dL (ref 0.44–1.00)
GFR calc Af Amer: >60 mL/min (ref >60)
GLUCOSE: 277 mg/dL — AB (ref 65–99)
Potassium: 4.6 mmol/L (ref 3.5–5.1)
Sodium: 139 mmol/L (ref 135–145)

## 2016-07-09 LAB — GLUCOSE, CAPILLARY
Glucose-Capillary: 260 mg/dL — ABNORMAL HIGH (ref 65–99)
Glucose-Capillary: 295 mg/dL — ABNORMAL HIGH (ref 65–99)
Glucose-Capillary: 225 mg/dL — ABNORMAL HIGH (ref 65–99)
Glucose-Capillary: 196 mg/dL — ABNORMAL HIGH (ref 65–99)

## 2016-07-09 LAB — CBC
HCT: 42 % (ref 36.0–46.0)
Hemoglobin: 13.7 g/dL (ref 12.0–15.0)
MCH: 30 pg (ref 26.0–34.0)
MCHC: 32.6 g/dL (ref 30.0–36.0)
MCV: 91.9 fL (ref 78.0–100.0)
Platelets: 322 K/uL (ref 150–400)
RBC: 4.57 MIL/uL (ref 3.87–5.11)
RDW: 13.1 % (ref 11.5–15.5)
WBC: 4.8 K/uL (ref 4.0–10.5)

## 2016-07-09 MED ORDER — ORAL CARE MOUTH RINSE
15.0000 mL | Freq: Two times a day (BID) | OROMUCOSAL | Status: DC
  Administered 2016-07-09 – 2016-07-10 (×2): 15 mL via OROMUCOSAL

## 2016-07-09 MED ORDER — GUAIFENESIN ER 600 MG PO TB12
1200.0000 mg | ORAL_TABLET | Freq: Two times a day (BID) | ORAL | Status: DC
  Administered 2016-07-09 (×2): 1200 mg via ORAL
  Filled 2016-07-09 (×3): qty 2

## 2016-07-09 MED ORDER — ENSURE ENLIVE PO LIQD
237.0000 mL | Freq: Two times a day (BID) | ORAL | Status: DC
  Administered 2016-07-09 – 2016-07-10 (×3): 237 mL via ORAL

## 2016-07-09 MED ORDER — METHYLPREDNISOLONE SODIUM SUCC 125 MG IJ SOLR
60.0000 mg | Freq: Every day | INTRAMUSCULAR | Status: DC
  Administered 2016-07-10: 60 mg via INTRAVENOUS
  Filled 2016-07-09: qty 2

## 2016-07-09 MED ORDER — NICOTINE 21 MG/24HR TD PT24
21.0000 mg | MEDICATED_PATCH | Freq: Every day | TRANSDERMAL | Status: DC
  Administered 2016-07-09 – 2016-07-10 (×2): 21 mg via TRANSDERMAL
  Filled 2016-07-09 (×2): qty 1

## 2016-07-09 NOTE — Progress Notes (Signed)
Initial Nutrition Assessment  INTERVENTION:   Ensure Enlive po BID, each supplement provides 350 kcal and 20 grams of protein  NUTRITION DIAGNOSIS:   Increased nutrient needs related to acute illness as evidenced by estimated needs.  GOAL:   Patient will meet greater than or equal to 90% of their needs  MONITOR:   PO intake, Supplement acceptance, I & O's  REASON FOR ASSESSMENT:   Consult COPD Protocol  ASSESSMENT:   Pt with hx of COPD, HTN, IDDM, non-Hodgkin's lymphoma s/p XRT admitted with SOB x 1 month. Pt with acute exacerbation of COPD. Noted Right renal mass concerning for recurrent lymphoma. Pt with FTT, progressive lethargy x 1 month.    Weight hx variable. Unable to obtain hx at this time. No PO intake recorded. Per H&P pt more lethargic over the last month with decreased functional status.  CBG's: 161-260 Unable to complete Nutrition-Focused physical exam at this time.   Diet Order:  Diet Carb Modified Fluid consistency: Thin; Room service appropriate? Yes  Skin:  Reviewed, no issues  Last BM:  5/18  Height:   Ht Readings from Last 1 Encounters:  07/08/16 5' (1.524 m)    Weight:   Wt Readings from Last 1 Encounters:  07/08/16 125 lb (56.7 kg)    Ideal Body Weight:  45.4 kg  BMI:  Body mass index is 24.41 kg/m.  Estimated Nutritional Needs:   Kcal:  1400-1600  Protein:  70-80 grams  Fluid:  > 1.5 L/day  EDUCATION NEEDS:   No education needs identified at this time  [place], [place], [place] Pager [phone] After Hours Pager

## 2016-07-09 NOTE — Progress Notes (Signed)
PROGRESS NOTE    Marie Holmes  ZOX:[id number] DOB: 1935/02/25 DOA: 07/08/2016 PCP: Celene Squibb, MD    Brief Narrative:  81 year old female with a history of COPD who continues to smoke, presents with complaints of cough, shortness of breath and fatigue. Found to have COPD exacerbation and started on steroids, bronchodilators.   Assessment & Plan:   Principal Problem:   Acute exacerbation of chronic obstructive pulmonary disease (COPD) ([place]) Active Problems:   Essential hypertension   Non Hodgkin's lymphoma (H[facility])   Diabetes mellitus, insulin dependent (IDDM), controlled ([facility])   FTT (failure to thrive) in adult   Depression with anxiety   COPD with acute exacerbation ([facility])   Renal mass, right   1. COPD with acute exacerbation. Continue on steroids, antibiotics and bronchodilators. Continue pulmonary hygiene. Wean off oxygen as tolerated. 2. Right renal mass. We need to follow-up with oncology for further evaluation. 3. Failure to thrive. Currently related to progression of COPD . physical therapy evaluation requested. 4. Insulin-dependent diabetes. Anticipate improvement of blood sugars as steroids are tapered. 5. Hypertension. Continue Toprol And losartan  DVT prophylaxis: lovenox Code Status: Partial, no CPR or defbrillation Family Communication: discussed with daughter at the bedside Disposition Plan: discharge home once improved.   Consultants:     Procedures:     Antimicrobials:   Azithromycin 5/18>>    Subjective: Had an episode of shortness of breath this morning. Feels tremulous  Objective: Vitals:   07/08/16 2124 07/09/16 0624 07/09/16 0722 07/09/16 1440  BP:  (!) 144/61  (!) 132/58  Pulse:  87  86  Resp:  20  20  Temp:  98.3 F (36.8 C)  98.2 F (36.8 C)  TempSrc:  Oral    SpO2: 98% 97% 97% 93%  Weight:      Height:        Intake/Output Summary (Last 24 hours) at 07/09/16 1707 Last data filed at 07/09/16 1300  Gross per 24 hour  Intake               243 ml  Output                0 ml  Net              243 ml   Filed Weights   07/08/16 1514  Weight: 56.7 kg (125 lb)    Examination:  General exam: Appears calm and comfortable  Respiratory system: Clear to auscultation. Respiratory effort normal. Cardiovascular system: S1 & S2 heard, RRR. No JVD, murmurs, rubs, gallops or clicks. No pedal edema. Gastrointestinal system: Abdomen is nondistended, soft and nontender. No organomegaly or masses felt. Normal bowel sounds heard. Central nervous system: Alert and oriented. No focal neurological deficits. tremulous Extremities: Symmetric 5 x 5 power. Skin: No rashes, lesions or ulcers Psychiatry: Judgement and insight appear normal. Mood & affect appropriate.     Data Reviewed: I have personally reviewed following labs and imaging studies  CBC:  Recent Labs Lab 07/08/16 1536 07/09/16 0635  WBC 7.0 4.8  NEUTROABS 4.1  --   HGB 13.7 13.7  HCT 41.0 42.0  MCV 90.1 91.9  PLT 319 811   Basic Metabolic Panel:  Recent Labs Lab 07/08/16 1536 07/09/16 0635  NA 138 139  K 4.2 4.6  CL 102 103  CO2 28 29  GLUCOSE 272* 277*  BUN 18 23*  CREATININE 0.96 0.85  CALCIUM 9.2 9.1   GFR: Estimated Creatinine Clearance: 42.4 mL/min (by C-G formula based  on SCr of 0.85 mg/dL). Liver Function Tests:  Recent Labs Lab 07/08/16 1536  AST 19  ALT 14  ALKPHOS 79  BILITOT 0.7  PROT 6.3*  ALBUMIN 3.4*   No results for input(s): LIPASE, AMYLASE in the last 168 hours. No results for input(s): AMMONIA in the last 168 hours. Coagulation Profile: No results for input(s): INR, PROTIME in the last 168 hours. Cardiac Enzymes:  Recent Labs Lab 07/08/16 1536  TROPONINI <0.03   BNP (last 3 results) No results for input(s): PROBNP in the last 8760 hours. HbA1C: No results for input(s): HGBA1C in the last 72 hours. CBG:  Recent Labs Lab 07/08/16 1538 07/08/16 2307 07/09/16 0754 07/09/16 1223 07/09/16 1659  GLUCAP  234* 161* 260* 295* 225*   Lipid Profile: No results for input(s): CHOL, HDL, LDLCALC, TRIG, CHOLHDL, LDLDIRECT in the last 72 hours. Thyroid Function Tests: No results for input(s): TSH, T4TOTAL, FREET4, T3FREE, THYROIDAB in the last 72 hours. Anemia Panel: No results for input(s): VITAMINB12, FOLATE, FERRITIN, TIBC, IRON, RETICCTPCT in the last 72 hours. Sepsis Labs: No results for input(s): PROCALCITON, LATICACIDVEN in the last 168 hours.  No results found for this or any previous visit (from the past 240 hour(s)).       Radiology Studies: Dg Chest 2 View  Result Date: 07/08/2016 CLINICAL DATA:  Initial evaluation for acute shortness of breath. History of COPD. EXAM: CHEST  2 VIEW COMPARISON:  Prior radiograph from 03/14/2016. FINDINGS: The cardiac and mediastinal silhouettes are stable in size and contour, and remain within normal limits. Aortic atherosclerosis. The lungs are mildly hyperinflated. Changes related COPD. No airspace consolidation, pleural effusion, or pulmonary edema is identified. There is no pneumothorax. No acute osseous abnormality identified.  Osteopenia noted. IMPRESSION: 1. COPD.  No other active cardiopulmonary disease. 2. Aortic atherosclerosis. Electronically Signed   By: Jeannine Boga M.D.   On: 07/08/2016 16:02   Ct Head Wo Contrast  Result Date: 07/08/2016 CLINICAL DATA:  Patient states she choked on water today with coughing and near syncopal episode today with a dull headache for 1 week. EXAM: CT HEAD WITHOUT CONTRAST TECHNIQUE: Contiguous axial images were obtained from the base of the skull through the vertex without intravenous contrast. COMPARISON:  Brain MRI, 08/14/2014 FINDINGS: Brain: No evidence of acute infarction, hemorrhage, hydrocephalus, extra-axial collection or mass lesion/mass effect. Age related volume loss and mild chronic microvascular ischemic change stable from the prior brain MRI. Vascular: No hyperdense vessel or unexpected  calcification. Skull: Normal. Negative for fracture or focal lesion. Sinuses/Orbits: Globes orbits are unremarkable. Visualized sinuses and mastoid air cells are clear. Other: None. IMPRESSION: 1. No acute intracranial abnormalities. 2. Age related volume loss. Mild chronic microvascular ischemic change. Electronically Signed   By: Lajean Manes M.D.   On: 07/08/2016 18:08        Scheduled Meds: . ALPRAZolam  0.5 mg Oral Daily   And  . ALPRAZolam  1 mg Oral QHS  . aspirin EC  81 mg Oral q morning - 10a  . citalopram  40 mg Oral QPM  . enoxaparin (LOVENOX) injection  40 mg Subcutaneous Q24H  . feeding supplement (ENSURE ENLIVE)  237 mL Oral BID BM  . guaiFENesin  1,200 mg Oral BID  . insulin aspart  0-15 Units Subcutaneous TID WC  . insulin aspart  0-5 Units Subcutaneous QHS  . insulin glargine  15 Units Subcutaneous Daily  . losartan  50 mg Oral Daily  . methylPREDNISolone (SOLU-MEDROL) injection  60 mg  Intravenous Q8H  . metoprolol succinate  25 mg Oral BID  . mometasone-formoterol  2 puff Inhalation BID  . nicotine  21 mg Transdermal Daily  . sodium chloride flush  3 mL Intravenous Q12H  . vitamin B-12  1,000 mcg Oral Daily   Continuous Infusions: . sodium chloride    . azithromycin Stopped (07/08/16 2153)     LOS: 1 day    Time spent: 38mns    MEMON,JEHANZEB, MD [facility] Pager [phone] If 7PM-7AM, please contact night-coverage [email or web address] Password [email or web address] 07/09/2016, 5:07 PM

## 2016-07-09 NOTE — Progress Notes (Signed)
Late Entry:   MD was notified that the patients daughter and patient wanted to discuss the patient being transferred to [facility] due a abnormal CT scan from with her PCP ordered.  MD went down to the room and talked with the patient and family.

## 2016-07-10 LAB — HEMOGLOBIN A1C
Hgb A1c MFr Bld: 7.1 % — ABNORMAL HIGH (ref 4.8–5.6)
Mean Plasma Glucose: 157 mg/dL

## 2016-07-10 LAB — GLUCOSE, CAPILLARY
GLUCOSE-CAPILLARY: 153 mg/dL — AB (ref 65–99)
Glucose-Capillary: 237 mg/dL — ABNORMAL HIGH (ref 65–99)

## 2016-07-10 MED ORDER — TRAMADOL HCL 50 MG PO TABS: 50.0000 mg | ORAL_TABLET | Freq: Four times a day (QID) | ORAL | 0 refills | Status: DC | PRN

## 2016-07-10 MED ORDER — GUAIFENESIN ER 600 MG PO TB12: 1200.0000 mg | ORAL_TABLET | Freq: Two times a day (BID) | ORAL | 0 refills | Status: DC

## 2016-07-10 MED ORDER — PREDNISONE 10 MG PO TABS: ORAL_TABLET | ORAL | 0 refills | Status: DC

## 2016-07-10 MED ORDER — AZITHROMYCIN 250 MG PO TABS
ORAL_TABLET | ORAL | 0 refills | Status: DC
Start: 2016-07-10 — End: 2016-08-03

## 2016-07-10 NOTE — Progress Notes (Signed)
Patient discharged with instructions, prescription, and care notes.  Verbalized understanding via teach back.  IV was removed and the site was WNL. Patient voiced no further complaints or concerns at the time of discharge.  Appointments scheduled per instructions.  Patient left the floor via w/c family  And staff in stable condition. 

## 2016-07-10 NOTE — Discharge Summary (Signed)
Physician Discharge Summary  Marie Holmes DZH:[id number] DOB: 06-07-1935 DOA: 07/08/2016  PCP: Celene Squibb, MD  Admit date: 07/08/2016 Discharge date: 07/10/2016  Admitted From: home Disposition:  home  Recommendations for Outpatient Follow-up:  1. Follow up with PCP in 1-2 weeks 2. Please obtain BMP/CBC in one week 3. Patient will be referred back to Dr. Julien Nordmann to discuss Renal mass findings  Home Health: Equipment/Devices:oxygen at 2L, alternative treatments tried and found insufficient (nebs)  Discharge Condition: stable CODE STATUS:* partial code, no CPR or defibrillation Diet recommendation: Heart Healthy  Brief/Interim Summary: 81 year old female with a history of COPD who continues to smoke, presents with complaints of cough, shortness of breath and fatigue. Found to have COPD exacerbation and started on steroids, bronchodilators  Discharge Diagnoses:  Principal Problem:   Acute exacerbation of chronic obstructive pulmonary disease (COPD) ([place]) Active Problems:   Essential hypertension   Non Hodgkin's lymphoma (HCC)   Diabetes mellitus, insulin dependent (IDDM), controlled ([place])   FTT (failure to thrive) in adult   Depression with anxiety   COPD with acute exacerbation (HCC)   Renal mass, right  1. COPD with acute exacerbation. Patient was treated with intravenous steroids, antibiotics and bronchodilators. Her respiratory status has improved and wheezing has resolved. On ambulation, she was noted to become hypoxic to 80%. The patient did qualify for supplemental loss and was discharged with 2 L of oxygen. She was advised to quit smoking. She is continued on a prednisone taper. She already has nebulizers at home. 2. Right renal mass. 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 She is status post definitive radiotherapy. She recently had a CT abdomen and pelvis done for right flank  pain which was found to have a possible right renal mass. She will need to follow-up with oncology for further evaluation. Patient will be referred back to her primary oncologist 3. Failure to thrive. Currently related to progression of COPD . 4. Insulin-dependent diabetes. Blood sugars are stable in the hospital 5. Hypertension. Continue Toprol And losartan  Discharge Instructions  Discharge Instructions    Diet - low sodium heart healthy    Complete by:  As directed    Increase activity slowly    Complete by:  As directed      Allergies as of 07/10/2016      Reactions   Sulfonamide Derivatives Anaphylaxis   Metformin And Related Diarrhea      Medication List    TAKE these medications   albuterol 108 (90 Base) MCG/ACT inhaler Commonly known as:  PROVENTIL HFA;VENTOLIN HFA Inhale 2 puffs into the lungs every 6 (six) hours as needed for wheezing or shortness of breath.   albuterol (2.5 MG/3ML) 0.083% nebulizer solution Commonly known as:  PROVENTIL Take 3 mLs (2.5 mg total) by nebulization every 4 (four) hours as needed for wheezing or shortness of breath.   ALPRAZolam 0.5 MG tablet Commonly known as:  XANAX Take 0.5-1 mg by mouth 2 (two) times daily. One tablet daily and two at bedtime   aspirin EC 81 MG tablet Take 81 mg by mouth every morning.   azithromycin 250 MG tablet Commonly known as:  ZITHROMAX Z-PAK Take 566m po on day 1 and then 2561mdaily until complete   budesonide-formoterol 160-4.5 MCG/ACT inhaler Commonly known as:  SYMBICORT Inhale 2 puffs into the lungs 2 (two) times daily. What changed:  when to take this  reasons to take  this   citalopram 40 MG tablet Commonly known as:  CELEXA Take 1 tablet (40 mg total) by mouth daily. What changed:  when to take this   guaiFENesin 600 MG 12 hr tablet Commonly known as:  MUCINEX Take 2 tablets (1,200 mg total) by mouth 2 (two) times daily.   Insulin Pen Needle 32G X 4 MM Misc Use to inject 4x daily.  [employer]ion Brand   INSULIN SYRINGE .3CC/31GX5/16" 31G X 5/16" 0.3 ML Misc Use to inject insulin 3 times daily as instructed.   losartan 50 MG tablet Commonly known as:  COZAAR TAKE ONE TABLET BY MOUTH ONCE DAILY IN THE MORNING   metoprolol succinate 25 MG 24 hr tablet Commonly known as:  TOPROL-XL Take 25 mg by mouth 2 (two) times daily.   [place] w/Device Kit Use as directed to test blood sugar three times daily Dx:R73.09   ONE TOUCH LANCETS Misc Use as directed to test blood sugar three times daily Dx:R73.09   ONE TOUCH ULTRA TEST test strip Generic drug:  glucose blood USE ONE STRIP TO CHECK GLUCOSE THREE TIMES DAILY   predniSONE 10 MG tablet Commonly known as:  DELTASONE Take 28m po daily for 2 days then 37mdaily for 2 days then 2043maily for 2 days then 72m3mily for 2 days then stop   traMADol 50 MG tablet Commonly known as:  ULTRAM Take 1 tablet (50 mg total) by mouth every 6 (six) hours as needed.   TRESIBA FLEXTOUCH 200 UNIT/ML Sopn Generic drug:  Insulin Degludec Inject 15 Units into the skin every morning.   vitamin B-12 500 MCG tablet Commonly known as:  CYANOCOBALAMIN Take 1,000 mcg by mouth daily.            Durable Medical Equipment        Start     Ordered   07/10/16 1309  For home use only DME oxygen  Once    Question Answer Comment  Mode or (Route) Nasal cannula   Liters per Minute 2   Frequency Continuous (stationary and portable oxygen unit needed)   Oxygen conserving device Yes   Oxygen delivery system Gas      07/10/16 1308      Allergies  Allergen Reactions  . Sulfonamide Derivatives Anaphylaxis  . Metformin And Related Diarrhea    Consultations:     Procedures/Studies: Dg Chest 2 View  Result Date: 07/08/2016 CLINICAL DATA:  Initial evaluation for acute shortness of breath. History of COPD. EXAM: CHEST  2 VIEW COMPARISON:  Prior radiograph from 03/14/2016. FINDINGS: The cardiac and mediastinal  silhouettes are stable in size and contour, and remain within normal limits. Aortic atherosclerosis. The lungs are mildly hyperinflated. Changes related COPD. No airspace consolidation, pleural effusion, or pulmonary edema is identified. There is no pneumothorax. No acute osseous abnormality identified.  Osteopenia noted. IMPRESSION: 1. COPD.  No other active cardiopulmonary disease. 2. Aortic atherosclerosis. Electronically Signed   By: BenjJeannine Boga.   On: 07/08/2016 16:02   Ct Head Wo Contrast  Result Date: 07/08/2016 CLINICAL DATA:  Patient states she choked on water today with coughing and near syncopal episode today with a dull headache for 1 week. EXAM: CT HEAD WITHOUT CONTRAST TECHNIQUE: Contiguous axial images were obtained from the base of the skull through the vertex without intravenous contrast. COMPARISON:  Brain MRI, 08/14/2014 FINDINGS: Brain: No evidence of acute infarction, hemorrhage, hydrocephalus, extra-axial collection or mass lesion/mass effect. Age related volume loss and  mild chronic microvascular ischemic change stable from the prior brain MRI. Vascular: No hyperdense vessel or unexpected calcification. Skull: Normal. Negative for fracture or focal lesion. Sinuses/Orbits: Globes orbits are unremarkable. Visualized sinuses and mastoid air cells are clear. Other: None. IMPRESSION: 1. No acute intracranial abnormalities. 2. Age related volume loss. Mild chronic microvascular ischemic change. Electronically Signed   By: Lajean Manes M.D.   On: 07/08/2016 18:08   Ct Abdomen Pelvis W Contrast  Result Date: 06/22/2016 CLINICAL DATA:  Right-sided abdominal pain with diarrhea for 2 months. Status post radiation therapy for lymphoma 5 years ago. History of pyelonephritis. Kidney stones. Skin cancer. Gastroesophageal reflux disease. Hypertension. Diabetes. EXAM: CT ABDOMEN AND PELVIS WITH CONTRAST TECHNIQUE: Multidetector CT imaging of the abdomen and pelvis was performed using the  standard protocol following bolus administration of intravenous contrast. CONTRAST:  163m ISOVUE-300 IOPAMIDOL (ISOVUE-300) INJECTION 61% COMPARISON:  Pelvic ultrasound of 03/28/2016. Prior CT of 08/25/2014. FINDINGS: Lower chest: Mild subpleural fibrosis at the left lung base laterally, similar. Normal heart size without pericardial or pleural effusion. Hepatobiliary: Normal liver. Cholecystectomy, without biliary ductal dilatation. Pancreas: Normal, without mass or ductal dilatation. Spleen: Normal in size, without focal abnormality. Adrenals/Urinary Tract: Normal adrenal glands. Small bilateral renal cysts. Punctate lower pole left renal collecting system calculus. Bilateral too small to characterize renal lesions. Amourphous soft tissue identified about the inter/ upper pole right renal pelvis. Example image 23/series 2 and at 2.8 x 1.7 cm on image 15/series 3. Normal urinary bladder. Stomach/Bowel: Normal stomach, without wall thickening. Scattered colonic diverticula. Normal colon and terminal ileum. Normal small bowel. Vascular/Lymphatic: Aortic and branch vessel atherosclerosis. No abdominopelvic adenopathy. Reproductive: Hysterectomy.  No adnexal mass. Other: No significant free fluid. Mild to moderate pelvic floor laxity. Musculoskeletal: Mild T11 superior endplate compression deformity is similar to on the prior IMPRESSION: 1. Subtle abnormal soft tissue about the upper/inter pole right renal pelvis. Given the history of lymphoma, this is suspicious for recurrent lymphoma. Alternate explanation, such as urothelial carcinoma or felt unlikely, given absence of hydronephrosis. Consider oncology referral and possible PET. 2. Left nephrolithiasis. 3.  Aortic atherosclerosis. Electronically Signed   By: KAbigail MiyamotoM.D.   On: 06/22/2016 09:52       Subjective: Breathing better. Still has pain in right flank  Discharge Exam: Vitals:   07/09/16 2000 07/10/16 0400  BP: (!) 150/65 (!) 115/55  Pulse:  83 78  Resp: 16 17  Temp: 97.5 F (36.4 C) 97.8 F (36.6 C)   Vitals:   07/10/16 0515 07/10/16 0517 07/10/16 0758 07/10/16 0800  BP:      Pulse:      Resp:      Temp:      TempSrc:      SpO2: 90% 94% (!) 87% 92%  Weight:      Height:        General: Pt is alert, awake, not in acute distress Cardiovascular: RRR, S1/S2 +, no rubs, no gallops Respiratory: CTA bilaterally, no wheezing, no rhonchi Abdominal: Soft, NT, ND, bowel sounds + Extremities: no edema, no cyanosis    The results of significant diagnostics from this hospitalization (including imaging, microbiology, ancillary and laboratory) are listed below for reference.     Microbiology: No results found for this or any previous visit (from the past 240 hour(s)).   Labs: BNP (last 3 results)  Recent Labs  07/19/15 2010 07/08/16 1536  BNP 129.0* 465.7  Basic Metabolic Panel:  Recent Labs Lab 07/08/16 1536 07/09/16  0635  NA 138 139  K 4.2 4.6  CL 102 103  CO2 28 29  GLUCOSE 272* 277*  BUN 18 23*  CREATININE 0.96 0.85  CALCIUM 9.2 9.1   Liver Function Tests:  Recent Labs Lab 07/08/16 1536  AST 19  ALT 14  ALKPHOS 79  BILITOT 0.7  PROT 6.3*  ALBUMIN 3.4*   No results for input(s): LIPASE, AMYLASE in the last 168 hours. No results for input(s): AMMONIA in the last 168 hours. CBC:  Recent Labs Lab 07/08/16 1536 07/09/16 0635  WBC 7.0 4.8  NEUTROABS 4.1  --   HGB 13.7 13.7  HCT 41.0 42.0  MCV 90.1 91.9  PLT 319 322   Cardiac Enzymes:  Recent Labs Lab 07/08/16 1536  TROPONINI <0.03   BNP: Invalid input(s): POCBNP CBG:  Recent Labs Lab 07/09/16 1223 07/09/16 1659 07/09/16 2007 07/10/16 0749 07/10/16 1158  GLUCAP 295* 225* 196* 237* 153*   D-Dimer No results for input(s): DDIMER in the last 72 hours. Hgb A1c  Recent Labs  07/08/16 1546  HGBA1C 7.1*   Lipid Profile No results for input(s): CHOL, HDL, LDLCALC, TRIG, CHOLHDL, LDLDIRECT in the last 72  hours. Thyroid function studies No results for input(s): TSH, T4TOTAL, T3FREE, THYROIDAB in the last 72 hours.  Invalid input(s): FREET3 Anemia work up No results for input(s): VITAMINB12, FOLATE, FERRITIN, TIBC, IRON, RETICCTPCT in the last 72 hours. Urinalysis    Component Value Date/Time   COLORURINE YELLOW 07/20/2015 0530   APPEARANCEUR HAZY (A) 07/20/2015 0530   LABSPEC 1.020 07/20/2015 0530   PHURINE 5.5 07/20/2015 0530   GLUCOSEU >1000 (A) 07/20/2015 0530   HGBUR NEGATIVE 07/20/2015 0530   BILIRUBINUR NEGATIVE 07/20/2015 0530   BILIRUBINUR neg 03/05/2015 0944   KETONESUR NEGATIVE 07/20/2015 0530   PROTEINUR NEGATIVE 07/20/2015 0530   UROBILINOGEN 0.2 03/05/2015 0944   UROBILINOGEN 0.2 10/25/2014 1839   NITRITE NEGATIVE 07/20/2015 0530   LEUKOCYTESUR NEGATIVE 07/20/2015 0530   Sepsis Labs Invalid input(s): PROCALCITONIN,  WBC,  LACTICIDVEN Microbiology No results found for this or any previous visit (from the past 240 hour(s)).   Time coordinating discharge: Over 30 minutes  SIGNED:   Kathie Dike, MD  [facility] 07/10/2016, 5:40 PM Pager   If 7PM-7AM, please contact night-coverage [email or web address] Password [email or web address]

## 2016-07-10 NOTE — Progress Notes (Signed)
SATURATION QUALIFICATIONS: (This note is used to comply with regulatory documentation for home oxygen)  Patient Saturations on Room Air at Rest =  92%  Patient Saturations on Room Air while Ambulating = 88%  Patient Saturations on 2 Liters of oxygen while Ambulating = 94%  Please briefly explain why patient needs home oxygen:  Patient was some SOB while ambulating and she does state she feels better with the O2.

## 2016-07-10 NOTE — Progress Notes (Signed)
Patient started to walk in hallway independently on RA. This RN assisted patient to walk back to room. SpO2 90% on RA, sitting after ambulation. O2 2L [place] re-applied. Patient encouraged to call for assistance to ambulate in hallway so staff can monitor SpO2. Patient agrees. No distress noted. Patient denies needs at this time.

## 2016-07-10 NOTE — Progress Notes (Signed)
Spoke with Dr. Roderic Palau about the patients test for home O2.  He inquired if the patient could qualify for home O2 at 88%.  He asked me to call the company and find out. initially the patient said she wanted her O2 from [place] according to the MD.  I informed him that the [place] closes at  Am on Sundays and he stated we would have to go thru [facility].  Voiced this to the patient and she verbalized understanding. Notified Lattie Haw with costumer service for [employer] and she verified that in order for insurance to cover the sat has to be 88% or lower.  Voiced to the MD.  Faxed all info needed to the facility and spoke with [facility] of the facility.  She states they would call for questions.   1552 O2 arrived on the unit. I will discharge as ordered.

## 2016-07-12 ENCOUNTER — Telehealth: Payer: Self-pay | Admitting: Medical Oncology

## 2016-07-12 NOTE — Telephone Encounter (Signed)
requests appt for new "renal mass " discharged from hospital last week. She sees PCP now  for f/u follicular grade 1 lymphoma.note to [place].

## 2016-07-13 ENCOUNTER — Telehealth: Payer: Self-pay | Admitting: Internal Medicine

## 2016-07-13 ENCOUNTER — Telehealth: Payer: Self-pay | Admitting: Medical Oncology

## 2016-07-13 ENCOUNTER — Other Ambulatory Visit: Payer: Self-pay | Admitting: Medical Oncology

## 2016-07-13 DIAGNOSIS — M7989 Other specified soft tissue disorders: Secondary | ICD-10-CM

## 2016-07-13 NOTE — Telephone Encounter (Signed)
Patient's daughter called and said to cancel referrals that patient PCP will do that

## 2016-07-13 NOTE — Telephone Encounter (Signed)
told daughter referral made to [facility] and Sent request for appt with [facility].

## 2016-07-14 DIAGNOSIS — E1165 Type 2 diabetes mellitus with hyperglycemia: Secondary | ICD-10-CM | POA: Diagnosis not present

## 2016-07-14 DIAGNOSIS — J441 Chronic obstructive pulmonary disease with (acute) exacerbation: Secondary | ICD-10-CM | POA: Diagnosis not present

## 2016-07-14 DIAGNOSIS — R109 Unspecified abdominal pain: Secondary | ICD-10-CM | POA: Diagnosis not present

## 2016-07-14 DIAGNOSIS — N2889 Other specified disorders of kidney and ureter: Secondary | ICD-10-CM | POA: Diagnosis not present

## 2016-07-24 DIAGNOSIS — J449 Chronic obstructive pulmonary disease, unspecified: Secondary | ICD-10-CM | POA: Diagnosis not present

## 2016-08-01 DIAGNOSIS — E1165 Type 2 diabetes mellitus with hyperglycemia: Secondary | ICD-10-CM | POA: Diagnosis not present

## 2016-08-01 DIAGNOSIS — R109 Unspecified abdominal pain: Secondary | ICD-10-CM | POA: Diagnosis not present

## 2016-08-01 DIAGNOSIS — J441 Chronic obstructive pulmonary disease with (acute) exacerbation: Secondary | ICD-10-CM | POA: Diagnosis not present

## 2016-08-01 DIAGNOSIS — N2889 Other specified disorders of kidney and ureter: Secondary | ICD-10-CM | POA: Diagnosis not present

## 2016-08-01 DIAGNOSIS — Z6823 Body mass index (BMI) 23.0-23.9, adult: Secondary | ICD-10-CM | POA: Diagnosis not present

## 2016-08-03 ENCOUNTER — Encounter (HOSPITAL_COMMUNITY): Payer: Self-pay | Admitting: Oncology

## 2016-08-03 ENCOUNTER — Encounter (HOSPITAL_COMMUNITY): Payer: PPO | Attending: Oncology | Admitting: Oncology

## 2016-08-03 ENCOUNTER — Encounter (HOSPITAL_COMMUNITY): Payer: PPO

## 2016-08-03 VITALS — BP 145/57 | HR 69 | Resp 20 | Ht 61.0 in | Wt 124.5 lb

## 2016-08-03 DIAGNOSIS — Z8572 Personal history of non-Hodgkin lymphomas: Secondary | ICD-10-CM

## 2016-08-03 DIAGNOSIS — Z7982 Long term (current) use of aspirin: Secondary | ICD-10-CM | POA: Diagnosis not present

## 2016-08-03 DIAGNOSIS — Z87442 Personal history of urinary calculi: Secondary | ICD-10-CM | POA: Insufficient documentation

## 2016-08-03 DIAGNOSIS — D174 Benign lipomatous neoplasm of intrathoracic organs: Secondary | ICD-10-CM | POA: Diagnosis not present

## 2016-08-03 DIAGNOSIS — I110 Hypertensive heart disease with heart failure: Secondary | ICD-10-CM | POA: Diagnosis not present

## 2016-08-03 DIAGNOSIS — I10 Essential (primary) hypertension: Secondary | ICD-10-CM

## 2016-08-03 DIAGNOSIS — D491 Neoplasm of unspecified behavior of respiratory system: Secondary | ICD-10-CM | POA: Insufficient documentation

## 2016-08-03 DIAGNOSIS — F329 Major depressive disorder, single episode, unspecified: Secondary | ICD-10-CM | POA: Diagnosis not present

## 2016-08-03 DIAGNOSIS — J449 Chronic obstructive pulmonary disease, unspecified: Secondary | ICD-10-CM | POA: Diagnosis not present

## 2016-08-03 DIAGNOSIS — F419 Anxiety disorder, unspecified: Secondary | ICD-10-CM | POA: Diagnosis not present

## 2016-08-03 DIAGNOSIS — I509 Heart failure, unspecified: Secondary | ICD-10-CM | POA: Diagnosis not present

## 2016-08-03 DIAGNOSIS — E785 Hyperlipidemia, unspecified: Secondary | ICD-10-CM | POA: Insufficient documentation

## 2016-08-03 DIAGNOSIS — F17200 Nicotine dependence, unspecified, uncomplicated: Secondary | ICD-10-CM | POA: Diagnosis not present

## 2016-08-03 DIAGNOSIS — M1990 Unspecified osteoarthritis, unspecified site: Secondary | ICD-10-CM | POA: Diagnosis not present

## 2016-08-03 DIAGNOSIS — Z794 Long term (current) use of insulin: Secondary | ICD-10-CM | POA: Diagnosis not present

## 2016-08-03 DIAGNOSIS — E119 Type 2 diabetes mellitus without complications: Secondary | ICD-10-CM | POA: Diagnosis not present

## 2016-08-03 DIAGNOSIS — R634 Abnormal weight loss: Secondary | ICD-10-CM | POA: Insufficient documentation

## 2016-08-03 DIAGNOSIS — N2889 Other specified disorders of kidney and ureter: Secondary | ICD-10-CM

## 2016-08-03 DIAGNOSIS — K219 Gastro-esophageal reflux disease without esophagitis: Secondary | ICD-10-CM | POA: Diagnosis not present

## 2016-08-03 DIAGNOSIS — G8929 Other chronic pain: Secondary | ICD-10-CM | POA: Insufficient documentation

## 2016-08-03 DIAGNOSIS — R109 Unspecified abdominal pain: Secondary | ICD-10-CM | POA: Insufficient documentation

## 2016-08-03 LAB — CBC WITH DIFFERENTIAL/PLATELET
Basophils Absolute: 0 10*3/uL (ref 0.0–0.1)
Basophils Relative: 0 %
Eosinophils Absolute: 0.3 10*3/uL (ref 0.0–0.7)
Eosinophils Relative: 4 %
HCT: 40 % (ref 36.0–46.0)
Hemoglobin: 13 g/dL (ref 12.0–15.0)
LYMPHS ABS: 2.1 10*3/uL (ref 0.7–4.0)
LYMPHS PCT: 25 %
MCH: 30.1 pg (ref 26.0–34.0)
MCHC: 32.5 g/dL (ref 30.0–36.0)
MCV: 92.6 fL (ref 78.0–100.0)
MONO ABS: 0.9 10*3/uL (ref 0.1–1.0)
Monocytes Relative: 11 %
Neutro Abs: 4.9 10*3/uL (ref 1.7–7.7)
Neutrophils Relative %: 60 %
PLATELETS: 245 10*3/uL (ref 150–400)
RBC: 4.32 MIL/uL (ref 3.87–5.11)
RDW: 13.7 % (ref 11.5–15.5)
WBC: 8.1 10*3/uL (ref 4.0–10.5)

## 2016-08-03 LAB — COMPREHENSIVE METABOLIC PANEL
ALT: 12 U/L — ABNORMAL LOW (ref 14–54)
ANION GAP: 6 (ref 5–15)
AST: 14 U/L — ABNORMAL LOW (ref 15–41)
Albumin: 3.8 g/dL (ref 3.5–5.0)
Alkaline Phosphatase: 62 U/L (ref 38–126)
BUN: 17 mg/dL (ref 6–20)
CHLORIDE: 100 mmol/L — AB (ref 101–111)
CO2: 34 mmol/L — ABNORMAL HIGH (ref 22–32)
Calcium: 9.3 mg/dL (ref 8.9–10.3)
Creatinine, Ser: 0.77 mg/dL (ref 0.44–1.00)
Glucose, Bld: 85 mg/dL (ref 65–99)
POTASSIUM: 4.2 mmol/L (ref 3.5–5.1)
Sodium: 140 mmol/L (ref 135–145)
Total Bilirubin: 0.5 mg/dL (ref 0.3–1.2)
Total Protein: 6.5 g/dL (ref 6.5–8.1)

## 2016-08-03 NOTE — Patient Instructions (Signed)
[place] at [facility] Discharge Instructions  RECOMMENDATIONS MADE BY THE CONSULTANT AND ANY TEST RESULTS WILL BE SENT TO YOUR REFERRING PHYSICIAN.  You were seen today by Dr. Oliva Bustard. PET scan to be scheduled.  Labs today. Return after PET scan is done.   Thank you for choosing [place] at [facility] to provide your oncology and hematology care.  To afford each patient quality time with our provider, please arrive at least 15 minutes before your scheduled appointment time.    If you have a lab appointment with the [facility] please come in thru the  Main Entrance and check in at the main information desk  You need to re-schedule your appointment should you arrive 10 or more minutes late.  We strive to give you quality time with our providers, and arriving late affects you and other patients whose appointments are after yours.  Also, if you no show three or more times for appointments you may be dismissed from the clinic at the providers discretion.     Again, thank you for choosing [facility].  Our hope is that these requests will decrease the amount of time that you wait before being seen by our physicians.       _____________________________________________________________  Should you have questions after your visit to [facility], please contact our office at [phone] between the hours of 8:30 a.m. and 4:30 p.m.  Voicemails left after 4:30 p.m. will not be returned until the following business day.  For prescription refill requests, have your pharmacy contact our office.       Resources For Cancer Patients and their Caregivers ? American Cancer Society: Can assist with transportation, wigs, general needs, runs Look Good Feel Better.        217-529-9797 ? Cancer Care: Provides financial assistance, online support groups, medication/co-pay assistance.  1-800-813-HOPE 587-646-2345) ? [place] Assists [place] cancer patients and their families through emotional , educational and financial support.  (630) 504-7542 ? [place] DSS Where to apply for food stamps, Medicaid and utility assistance. 804-528-8855 ? [facility]: Transportation to medical appointments. 508-307-2013 ? Social Security Administration: May apply for disability if have a Stage IV cancer. 8487584640 865-766-7485 ? [employer], Disability and Transit Services: Assists with nutrition, care and transit needs. [place] Support Programs: @10RELATIVEDAYS @ > Cancer Support Group  2nd Tuesday of the month 1pm-2pm, Journey Room  > Creative Journey  3rd Tuesday of the month 1130am-1pm, Journey Room  > Look Good Feel Better  1st Wednesday of the month 10am-12 noon, Journey Room (Call [place] to register [phone])

## 2016-08-03 NOTE — Progress Notes (Signed)
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 Sondra Come completed on 05/08/2012.  CURRENT THERAPY: Observation  INTERVAL HISTORY: 81 year old lady with a previous history of lymphoid neoplasm of lung (non-Hodgkin's lymphoma by history) and radiation therapy.  Patient's condition has been declining over  Time increasing right upper quadrant pain.  Poor appetite losing weight patient also is a chronic smoker was recently admitted in hospital with hypoxia.  He was started on oxygen and bronchodilator therapy as lost significant weight go chills no fever during hospitalization a CT scan revealed right renal pelvis mass.  CT scan was done in May of 2018  Patient is here for ongoing evaluation. Overall patient's performance status is poor.  Being taken care of by her daughter    MEDICAL HISTORY: Past Medical History:  Diagnosis Date  . Adenomatous colon polyp   . Anxiety   . Benign tumor of breast 2004   left  . Broken ribs    history of several broken ribs on right and compression  fx  . C. difficile colitis 06/21/2014  . Cancer ([place])    non-hodgkins b cell lymphoma  . CHF (congestive heart failure) ([place])    hosp. for CHF- 10/2013  . Chronic abdominal pain   . Cold virus    seen by Dr. Deborra Medina on 10/12, given Rx for antibiotic & steroid  . COPD (chronic obstructive pulmonary disease) ([place])   . Depression   . Diabetes mellitus without complication ([place])   . Diverticulosis   . Fibroma of skin    Multiple fibromas/lipomas on arms and legs B  . Gait instability   . GERD (gastroesophageal reflux disease)   . History of CT scan of abdomen 05/23/2003   Mild fatty liver inf, tiny hypodensity in right lobe  of liver  . Hx of radiation therapy 04/11/12- 05/08/12   left chest wall area, 40 gray 20 fx  . Hyperlipidemia   . Hypertension   . Kidney stone 02/21/1981   Right/removed  . Lung cancer ([place])    pulmonary lymphoid neoplasm questionable for non-Hodgkins lymphoma  . Osteoarthritis    hands, degenerative back , osteoporosis   . Palpitations   . Pulmonary nodules   . Pyelonephritis   . Shortness of breath    only with movemnent   . Tremor   . UTI (lower urinary tract infection)   . Weight loss    "multifactorial" per PMD note    ALLERGIES:  is allergic to sulfonamide derivatives and metformin and related.  MEDICATIONS:  Current Outpatient Prescriptions  Medication Sig Dispense Refill  . albuterol (PROVENTIL HFA;VENTOLIN HFA) 108 (90 BASE) MCG/ACT inhaler Inhale 2 puffs into the lungs every 6 (six) hours as needed for wheezing or shortness of breath. 1 Inhaler 2  . albuterol (PROVENTIL) (2.5 MG/3ML) 0.083% nebulizer solution Take 3 mLs (2.5 mg total) by nebulization every 4 (four) hours as needed for wheezing or shortness of breath. 75 mL 12  . ALPRAZolam (XANAX) 0.5 MG tablet Take 0.5-1 mg by mouth 2 (two) times daily. One tablet daily and two at bedtime    . aspirin EC 81 MG tablet Take 81 mg by mouth every morning.    Marland Kitchen azithromycin (ZITHROMAX Z-PAK)  250 MG tablet Take 5109m po on day 1 and then 2539mdaily until complete 6 each 0  . Blood Glucose Monitoring Suppl (ONGandyW/DEVICE KIT Use as directed to test blood sugar three times daily Dx:R73.09 1 each 0  . budesonide-formoterol (SYMBICORT) 160-4.5 MCG/ACT inhaler Inhale 2 puffs into the lungs 2 (two) times daily. (Patient taking differently: Inhale 2 puffs into the lungs daily as needed (for shortness of breath). ) 1 Inhaler 12  . citalopram (CELEXA) 40 MG tablet Take 1 tablet (40 mg total) by mouth daily. (Patient taking differently: Take 40 mg by mouth every evening. ) 90 tablet 3  . guaiFENesin (MUCINEX) 600 MG  12 hr tablet Take 2 tablets (1,200 mg total) by mouth 2 (two) times daily. 30 tablet 0  . Insulin Pen Needle 32G X 4 MM MISC Use to inject 4x daily. Relion Brand 130 each 4  . Insulin Syringe-Needle U-100 (INSULIN SYRINGE .3CC/31GX5/16") 31G X 5/16" 0.3 ML MISC Use to inject insulin 3 times daily as instructed. 100 each 5  . losartan (COZAAR) 50 MG tablet TAKE ONE TABLET BY MOUTH ONCE DAILY IN THE MORNING 30 tablet 0  . metoprolol succinate (TOPROL-XL) 25 MG 24 hr tablet Take 25 mg by mouth 2 (two) times daily.    . ONE TOUCH LANCETS MISC Use as directed to test blood sugar three times daily Dx:R73.09 300 each 2  . ONE TOUCH ULTRA TEST test strip USE ONE STRIP TO CHECK GLUCOSE THREE TIMES DAILY 100 each 11  . predniSONE (DELTASONE) 10 MG tablet Take 4082mo daily for 2 days then 39m64mily for 2 days then 20mg69mly for 2 days then 10mg 42my for 2 days then stop 20 tablet 0  . traMADol (ULTRAM) 50 MG tablet Take 1 tablet (50 mg total) by mouth every 6 (six) hours as needed. 20 tablet 0  . TRESIBA FLEXTOUCH 200 UNIT/ML SOPN Inject 15 Units into the skin every morning.     . vitamin B-12 (CYANOCOBALAMIN) 500 MCG tablet Take 1,000 mcg by mouth daily.      No current facility-administered medications for this visit.     SURGICAL HISTORY:  Past Surgical History:  Procedure Laterality Date  . ABDOMINAL HYSTERECTOMY  02/21/1970   right ovary remaining  . APPENDECTOMY  02/21/1961  . BONE MARROW BIOPSY  03/09/2012  . BREAST SURGERY  1970's   lump removed- benign   . CHOLECYSTECTOMY N/A 12/11/2013   Procedure: LAPAROSCOPIC CHOLECYSTECTOMY WITH INTRAOPERATIVE CHOLANGIOGRAM;  Surgeon: ThomasErroll Luna Location: [facility] OR;[place]: General;  Laterality: N/A;  . EXPLORATORY LAPAROTOMY  02/21/1970   for ? adhesions  . EYE SURGERY     bilateral cataracts removed - /W- IOL  . lung core biopsy  02/06/2012  . MASS EXCISION Right 04/21/2015   Procedure: EXCISION RIGHT THIGH  MASS;  Surgeon: ThomasErroll Luna Location: [facility] [place]: General;  Laterality: Right;  . TONSILLECTOMY    . TUBAL LIGATION      REVIEW OF SYSTEMS:  Gen. status: Performance status is 3  declining performance status.  Poor appetite.  Has lost significant weight. GU: Right flank pain ,no hematuria. GI: Poor appetite.  No hematemesis or melena. Lungs: Chronic smoker COPD recent admission in hospital with hypoxia on oxygen continues to smoke Patient is insulin-dependent diabetes All other systems have been reviewed  PHYSICAL EXAMINATION: Lungs: Emphysematous chest.  Bilateral rhonchi and wheezing.   Lymphatic system: No  palpable lymphadenopathy  Pale-looking lady in mild distress  Abdomen: Distended protuberant tenderness in the right flank area no palpable masses  Liver and spleen not palpable  Cardiac: Soft systolic murmur  Gen. status: Poor performance status on oxygen  Limited ambulation  Neurological system no localizing signs  Skin exam: Several small mobile subcutaneous nodule under the skin.  all other systems have been reviewed  ECOG PERFORMANCE STATUS: 3 - Symptomatic, >50% confined to bed  There were no vitals taken for this visit.  LABORATORY DATA: Lab Results  Component Value Date   WBC 4.8 07/09/2016   HGB 13.7 07/09/2016   HCT 42.0 07/09/2016   MCV 91.9 07/09/2016   PLT 322 07/09/2016      Chemistry      Component Value Date/Time   NA 139 07/09/2016 0635   NA 141 04/28/2015 0954   K 4.6 07/09/2016 0635   K 5.3 (H) 04/28/2015 0954   CL 103 07/09/2016 0635   CL 103 06/19/2012 0931   CO2 29 07/09/2016 0635   CO2 26 04/28/2015 0954   BUN 23 (H) 07/09/2016 0635   BUN 16.3 04/28/2015 0954   CREATININE 0.85 07/09/2016 0635   CREATININE 0.9 04/28/2015 0954      Component Value Date/Time   CALCIUM 9.1 07/09/2016 0635   CALCIUM 9.7 04/28/2015 0954   ALKPHOS 79 07/08/2016 1536   ALKPHOS 91 04/28/2015 0954   AST 19 07/08/2016 1536   AST 12 04/28/2015  0954   ALT 14 07/08/2016 1536   ALT <9 04/28/2015 0954   BILITOT 0.7 07/08/2016 1536   BILITOT 0.78 04/28/2015 0954       RADIOGRAPHIC STUDIES: Dg Chest 2 View  Result Date: 07/08/2016 CLINICAL DATA:  Initial evaluation for acute shortness of breath. History of COPD. EXAM: CHEST  2 VIEW COMPARISON:  Prior radiograph from 03/14/2016. FINDINGS: The cardiac and mediastinal silhouettes are stable in size and contour, and remain within normal limits. Aortic atherosclerosis. The lungs are mildly hyperinflated. Changes related COPD. No airspace consolidation, pleural effusion, or pulmonary edema is identified. There is no pneumothorax. No acute osseous abnormality identified.  Osteopenia noted. IMPRESSION: 1. COPD.  No other active cardiopulmonary disease. 2. Aortic atherosclerosis. Electronically Signed   By: Jeannine Boga M.D.   On: 07/08/2016 16:02   Ct Head Wo Contrast  Result Date: 07/08/2016 CLINICAL DATA:  Patient states she choked on water today with coughing and near syncopal episode today with a dull headache for 1 week. EXAM: CT HEAD WITHOUT CONTRAST TECHNIQUE: Contiguous axial images were obtained from the base of the skull through the vertex without intravenous contrast. COMPARISON:  Brain MRI, 08/14/2014 FINDINGS: Brain: No evidence of acute infarction, hemorrhage, hydrocephalus, extra-axial collection or mass lesion/mass effect. Age related volume loss and mild chronic microvascular ischemic change stable from the prior brain MRI. Vascular: No hyperdense vessel or unexpected calcification. Skull: Normal. Negative for fracture or focal lesion. Sinuses/Orbits: Globes orbits are unremarkable. Visualized sinuses and mastoid air cells are clear. Other: None. IMPRESSION: 1. No acute intracranial abnormalities. 2. Age related volume loss. Mild chronic microvascular ischemic change. Electronically Signed   By: Lajean Manes M.D.   On: 07/08/2016 18:08    ASSESSMENT AND PLAN:  1) Pulmonary  lymphoid neoplasm questionable for non-Hodgkin lymphoma involving the left lower lobe of the lung: Status post definitive radiotherapy under the care of Dr. Sondra Come.  The recent CT scan of the chest showed no findings to suggest residual or recurrent disease in the thorax.  I discussed the scan results with the patient and her daughter. I recommended for her to continue on observation with routine follow-up visit with her primary care physician. I would be happy to see her in the future on as-needed basis. 2) COPD: Continue current treatment with albuterol inhaler. 3) hypertension: Currently on Cozaar and Toprol-XL. 4) diabetes mellitus: She is currently on treatment with glipizide and Januvia.  CT scan has been reviewed.  There is right renal pelvis mass. Patient also is symptomatic with significant weight loss I suggested either  get a PET scan and/or urological evaluation patient does not want to go to urologist at present time Would like to get a PET scan done we will also be checked patient's hemoglobin and chemistry. We discussed that we are dealing with either lymphomatous involvement of right renal pelvis versus another neoplasm. Reevaluate patient after PET scan is available.

## 2016-08-10 ENCOUNTER — Ambulatory Visit (HOSPITAL_COMMUNITY)
Admission: RE | Admit: 2016-08-10 | Discharge: 2016-08-10 | Disposition: A | Discharge status: Home / Self Care | Payer: PPO | Source: Ambulatory Visit | Attending: Oncology | Admitting: Oncology

## 2016-08-10 DIAGNOSIS — I2510 Atherosclerotic heart disease of native coronary artery without angina pectoris: Secondary | ICD-10-CM | POA: Insufficient documentation

## 2016-08-10 DIAGNOSIS — R448 Other symptoms and signs involving general sensations and perceptions: Secondary | ICD-10-CM | POA: Diagnosis not present

## 2016-08-10 DIAGNOSIS — J439 Emphysema, unspecified: Secondary | ICD-10-CM | POA: Diagnosis not present

## 2016-08-10 DIAGNOSIS — R0602 Shortness of breath: Secondary | ICD-10-CM | POA: Diagnosis not present

## 2016-08-10 DIAGNOSIS — N2889 Other specified disorders of kidney and ureter: Secondary | ICD-10-CM | POA: Diagnosis not present

## 2016-08-10 DIAGNOSIS — I700 Atherosclerosis of aorta: Secondary | ICD-10-CM | POA: Diagnosis not present

## 2016-08-10 LAB — GLUCOSE, CAPILLARY: Glucose-Capillary: 94 mg/dL (ref 65–99)

## 2016-08-10 MED ORDER — FLUDEOXYGLUCOSE F - 18 (FDG) INJECTION
6.1800 | Freq: Once | INTRAVENOUS | Status: AC | PRN
  Administered 2016-08-10: 6.18 via INTRAVENOUS

## 2016-08-12 ENCOUNTER — Encounter (HOSPITAL_BASED_OUTPATIENT_CLINIC_OR_DEPARTMENT_OTHER): Payer: PPO | Admitting: Hematology

## 2016-08-12 ENCOUNTER — Encounter (HOSPITAL_COMMUNITY): Payer: Self-pay | Admitting: Hematology

## 2016-08-12 VITALS — BP 156/57 | HR 75 | Resp 18 | Ht 61.0 in | Wt 119.5 lb

## 2016-08-12 DIAGNOSIS — I10 Essential (primary) hypertension: Secondary | ICD-10-CM

## 2016-08-12 DIAGNOSIS — Z8579 Personal history of other malignant neoplasms of lymphoid, hematopoietic and related tissues: Secondary | ICD-10-CM | POA: Diagnosis not present

## 2016-08-12 DIAGNOSIS — J449 Chronic obstructive pulmonary disease, unspecified: Secondary | ICD-10-CM

## 2016-08-12 DIAGNOSIS — C8207 Follicular lymphoma grade I, spleen: Secondary | ICD-10-CM

## 2016-08-12 DIAGNOSIS — N2889 Other specified disorders of kidney and ureter: Secondary | ICD-10-CM

## 2016-08-12 NOTE — Patient Instructions (Addendum)
[place] at [facility] Discharge Instructions  RECOMMENDATIONS MADE BY THE CONSULTANT AND ANY TEST RESULTS WILL BE SENT TO YOUR REFERRING PHYSICIAN.  You were seen today by Dr. Kerry Dory will be scheduled for CT scan in 2 months Follow up in 2 months We will get you referred to urology, they will call you with an appointment.   Thank you for choosing [place] at [facility] to provide your oncology and hematology care.  To afford each patient quality time with our provider, please arrive at least 15 minutes before your scheduled appointment time.    If you have a lab appointment with the [facility] please come in thru the  Main Entrance and check in at the main information desk  You need to re-schedule your appointment should you arrive 10 or more minutes late.  We strive to give you quality time with our providers, and arriving late affects you and other patients whose appointments are after yours.  Also, if you no show three or more times for appointments you may be dismissed from the clinic at the providers discretion.     Again, thank you for choosing [facility].  Our hope is that these requests will decrease the amount of time that you wait before being seen by our physicians.       _____________________________________________________________  Should you have questions after your visit to [facility], please contact our office at [phone] between the hours of 8:30 a.m. and 4:30 p.m.  Voicemails left after 4:30 p.m. will not be returned until the following business day.  For prescription refill requests, have your pharmacy contact our office.       Resources For Cancer Patients and their Caregivers ? American Cancer Society: Can assist with transportation, wigs, general needs, runs Look Good Feel Better.        (803)723-9914 ? Cancer Care: Provides financial assistance, online support groups,  medication/co-pay assistance.  1-800-813-HOPE (778)576-9284) ? [place] Assists [place] cancer patients and their families through emotional , educational and financial support.  (352)548-5228 ? [place] DSS Where to apply for food stamps, Medicaid and utility assistance. 825-119-2513 ? [facility]: Transportation to medical appointments. 431-485-6981 ? Social Security Administration: May apply for disability if have a Stage IV cancer. 236-624-1219 315-762-9349 ? [employer], Disability and Transit Services: Assists with nutrition, care and transit needs. [place] Support Programs: @10RELATIVEDAYS @ > Cancer Support Group  2nd Tuesday of the month 1pm-2pm, Journey Room  > Creative Journey  3rd Tuesday of the month 1130am-1pm, Journey Room  > Look Good Feel Better  1st Wednesday of the month 10am-12 noon, Journey Room (Call [place] to register [phone])

## 2016-08-14 NOTE — Progress Notes (Signed)
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 Sondra Come completed on 05/08/2012.  CURRENT THERAPY: Observation  SUMMARY OF ONCOLOGIC HISTORY:   Non Hodgkin's lymphoma ([place])   11/28/2012 Initial Diagnosis    Non Hodgkin's lymphoma ([place])     08/11/2016 PET scan    1. There are no scratch set no hypermetabolic mass or adenopathy identified suggestive of residual or recurrent lymphoma. 2. With respect to the abnormal increase soft tissue associated with the right renal collecting system, there is inherently intense physiologic radiotracer activity within the renal collecting systems on FDG PET imaging. Suggest followup imaging with a repeat hematuria protocol CT in 3-6 months (from 06/22/2016) to confirm persistence of abnormal soft tissue within this area. If persistent then biopsy may be indicated. 3. Aortic Atherosclerosis (ICD10-I70.0) and Emphysema (ICD10-J43.9). Coronary artery calcifications noted.       INTERVAL HISTORY:  Patient is here for f/u of her PET/CT. She notes no acute new symptoms. Continues to be  O2 for COPD and still smoking. Previously CT abd noted rt renal pelvis mass which is not noted on PET/CT. No evidence of NHL on PET/CT. Has some vague rt flank discomfort. No hematuria.  REVIEW OF SYSTEMS:    10 Point review of systems of done and is negative except as noted above.  . Past Medical History:  Diagnosis Date  . Adenomatous colon polyp   . Anxiety   . Benign tumor of breast 2004   left  . Broken ribs    history of several broken ribs on right and compression  fx  . C. difficile colitis  06/21/2014  . Cancer ([place])    non-hodgkins b cell lymphoma  . CHF (congestive heart failure) ([place])    hosp. for CHF- 10/2013  . Chronic abdominal pain   . Cold virus    seen by Dr. Deborra Medina on 10/12, given Rx for antibiotic & steroid  . COPD (chronic obstructive pulmonary disease) ([place])   . Depression   . Diabetes mellitus without complication ([place])   . Diverticulosis   . Fibroma of skin    Multiple fibromas/lipomas on arms and legs B  . Gait instability   . GERD (gastroesophageal reflux disease)   . History of CT scan of abdomen 05/23/2003   Mild fatty liver inf, tiny hypodensity in right lobe of liver  . Hx of radiation therapy 04/11/12- 05/08/12   left chest wall area, 40 gray 20 fx  . Hyperlipidemia   . Hypertension   . Kidney stone 02/21/1981   Right/removed  . Lung cancer ([place])    pulmonary lymphoid neoplasm questionable for non-Hodgkins lymphoma  . Osteoarthritis    hands, degenerative back , osteoporosis   . Palpitations   . Pulmonary nodules   . Pyelonephritis   . Shortness of breath    only with movemnent   . Tremor   . UTI (lower urinary tract infection)   . Weight loss    "multifactorial" per PMD note    . Past Surgical History:  Procedure Laterality Date  . ABDOMINAL HYSTERECTOMY  02/21/1970   right ovary remaining  . APPENDECTOMY  02/21/1961  . BONE MARROW BIOPSY  03/09/2012  . BREAST SURGERY  1970's   lump removed- benign   . CHOLECYSTECTOMY N/A 12/11/2013   Procedure: LAPAROSCOPIC CHOLECYSTECTOMY WITH INTRAOPERATIVE CHOLANGIOGRAM;  Surgeon: Erroll Luna, MD;  Location: [facility];  Service: General;  Laterality: N/A;  . EXPLORATORY LAPAROTOMY  02/21/1970   for ? adhesions  . EYE SURGERY     bilateral cataracts removed - /W- IOL  . lung core biopsy  02/06/2012  . MASS EXCISION Right 04/21/2015   Procedure: EXCISION RIGHT THIGH  MASS;  Surgeon: Erroll Luna, MD;  Location: [facility];  Service: General;  Laterality: Right;  . TONSILLECTOMY      . TUBAL LIGATION      . Social History  Substance Use Topics  . Smoking status: Current Every Day Smoker    Packs/day: 0.25    Years: 30.00    Types: Cigarettes  . Smokeless tobacco: Never Used     Comment: trying to quit smokes about 2-3 cigs per day, e-cigs with no nicotine  . Alcohol use No    ALLERGIES:  is allergic to sulfonamide derivatives and metformin and related.  MEDICATIONS:  Current Outpatient Prescriptions  Medication Sig Dispense Refill  . ALPRAZolam (XANAX) 0.5 MG tablet Take 0.5-1 mg by mouth 2 (two) times daily. One tablet daily and two at bedtime    . aspirin EC 81 MG tablet Take 81 mg by mouth every morning.    . Blood Glucose Monitoring Suppl ([place]) W/DEVICE KIT Use as directed to test blood sugar three times daily Dx:R73.09 1 each 0  . budesonide-formoterol (SYMBICORT) 160-4.5 MCG/ACT inhaler Inhale 2 puffs into the lungs 2 (two) times daily. (Patient taking differently: Inhale 2 puffs into the lungs daily as needed (for shortness of breath). ) 1 Inhaler 12  . citalopram (CELEXA) 40 MG tablet Take 1 tablet (40 mg total) by mouth daily. (Patient taking differently: Take 40 mg by mouth every evening. ) 90 tablet 3  . guaiFENesin (MUCINEX) 600 MG 12 hr tablet Take 2 tablets (1,200 mg total) by mouth 2 (two) times daily. 30 tablet 0  . HYDROcodone-acetaminophen (NORCO) 7.5-325 MG tablet     . Insulin Pen Needle 32G X 4 MM MISC Use to inject 4x daily. Relion Brand 130 each 4  . Insulin Syringe-Needle U-100 (INSULIN SYRINGE .3CC/31GX5/16") 31G X 5/16" 0.3 ML MISC Use to inject insulin 3 times daily as instructed. 100 each 5  . losartan (COZAAR) 50 MG tablet TAKE ONE TABLET BY MOUTH ONCE DAILY IN THE MORNING 30 tablet 0  . metoprolol succinate (TOPROL-XL) 25 MG 24 hr tablet Take 25 mg by mouth 2 (two) times daily.    . ONE TOUCH LANCETS MISC Use as directed to test blood sugar three times daily Dx:R73.09 300 each 2  . ONE TOUCH ULTRA TEST test strip  USE ONE STRIP TO CHECK GLUCOSE THREE TIMES DAILY 100 each 11  . OXYGEN Inhale 2 L into the lungs continuous.    . TRESIBA FLEXTOUCH 200 UNIT/ML SOPN Inject 15 Units into the skin every morning.     . vitamin B-12 (CYANOCOBALAMIN) 500 MCG tablet Take 1,000 mcg by mouth daily.      No current facility-administered medications for this visit.     PHYSICAL EXAMINATION: ECOG PERFORMANCE STATUS: 3 - Symptomatic, >50% confined to bed  . Vitals:   08/12/16 1455  BP: (!) 156/57  Pulse: 75  Resp: 18    Filed Weights   08/12/16 1455  Weight:  119 lb 8 oz (54.2 kg)   .Body mass index is 22.58 kg/m.  GENERAL:alert, in no acute distress and comfortable SKIN: no acute rashes, no significant lesions EYES: conjunctiva are pink and non-injected, sclera anicteric OROPHARYNX: MMM, no exudates, no oropharyngeal erythema or ulceration NECK: supple, no JVD LYMPH:  no palpable lymphadenopathy in the cervical, axillary or inguinal regions LUNGS: clear to auscultation b/l with normal respiratory effort HEART: regular rate & rhythm ABDOMEN:  normoactive bowel sounds , non tender, not distended. Extremity: no pedal edema PSYCH: alert & oriented x 3 with fluent speech NEURO: no focal motor/sensory deficits  LABORATORY DATA:   I have reviewed the data as listed  . CBC Latest Ref Rng & Units 08/03/2016 07/09/2016 07/08/2016  WBC 4.0 - 10.5 K/uL 8.1 4.8 7.0  Hemoglobin 12.0 - 15.0 g/dL 13.0 13.7 13.7  Hematocrit 36.0 - 46.0 % 40.0 42.0 41.0  Platelets 150 - 400 K/uL 245 322 319    . CMP Latest Ref Rng & Units 08/03/2016 07/09/2016 07/08/2016  Glucose 65 - 99 mg/dL 85 277(H) 272(H)  BUN 6 - 20 mg/dL 17 23(H) 18  Creatinine 0.44 - 1.00 mg/dL 0.77 0.85 0.96  Sodium 135 - 145 mmol/L 140 139 138  Potassium 3.5 - 5.1 mmol/L 4.2 4.6 4.2  Chloride 101 - 111 mmol/L 100(L) 103 102  CO2 22 - 32 mmol/L 34(H) 29 28  Calcium 8.9 - 10.3 mg/dL 9.3 9.1 9.2  Total Protein 6.5 - 8.1 g/dL 6.5 - 6.3(L)  Total  Bilirubin 0.3 - 1.2 mg/dL 0.5 - 0.7  Alkaline Phos 38 - 126 U/L 62 - 79  AST 15 - 41 U/L 14(L) - 19  ALT 14 - 54 U/L 12(L) - 14     RADIOGRAPHIC STUDIES: I have personally reviewed the radiological images as listed and agreed with the findings in the report. Nm Pet Image Restag (ps) Skull Base To Thigh  Result Date: 08/10/2016 CLINICAL DATA:  Subsequent treatment strategy for lymphoma. EXAM: NUCLEAR MEDICINE PET SKULL BASE TO THIGH TECHNIQUE: 6.18 mCi F-18 FDG was injected intravenously. Full-ring PET imaging was performed from the skull base to thigh after the radiotracer. CT data was obtained and used for attenuation correction and anatomic localization. FASTING BLOOD GLUCOSE:  Value: 94 mg/dl COMPARISON:  01/27/2012 FINDINGS: NECK No hypermetabolic lymph nodes in the neck. CHEST The heart size appears normal. Aortic atherosclerosis noted. Calcification in the LAD and RCA and left circumflex coronary artery is noted. No pericardial effusion. No hypermetabolic mediastinal, hilar, axillary or supraclavicular lymph nodes. No pleural effusion. Moderate change of centrilobular emphysema. No hypermetabolic pulmonary nodules. There is a small nodule within the anterior right upper lobe measuring 3 mm, image 27 of series 8. Too small to reliably characterize. This is unchanged from 2013 and is considered to represent a benign abnormality. ABDOMEN/PELVIS No abnormal hypermetabolic activity within the liver, pancreas, adrenal glands, or spleen. No hypermetabolic lymph nodes in the abdomen or pelvis. SKELETON No focal hypermetabolic activity to suggest skeletal metastasis. IMPRESSION: 1. There are no scratch set no hypermetabolic mass or adenopathy identified suggestive of residual or recurrent lymphoma. 2. With respect to the abnormal increase soft tissue associated with the right renal collecting system, there is inherently intense physiologic radiotracer activity within the renal collecting systems on FDG PET  imaging. Suggest followup imaging with a repeat hematuria protocol CT in 3-6 months (from 06/22/2016) to confirm persistence of abnormal soft tissue within this area. If persistent then biopsy may be indicated. 3.  Aortic Atherosclerosis (ICD10-I70.0) and Emphysema (ICD10-J43.9). Coronary artery calcifications noted. Electronically Signed   By: Kerby Moors M.D.   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 Sondra Come.   The recent PET/CT scan showed no findings to suggest residual or recurrent disease. Plan -PET/CT results were discussed in detailed in details with the patient and her daughter. -continue clinical surveillance.  2) COPD: Continue current treatment with albuterol inhaler. On [place] O2 2L/min -counseled on smoking cessation.  3) hypertension: Currently on Cozaar and Toprol-XL.  4) RT renal pelvis mass noted on previous CT. The mass is not noted on PET/CT which may not be the best study to evaluate this since the entire collecting system is highlighted. Plan   CT abd/pelvis hematuria protocol in 2 months Urology referral for rt renal pelvic mass RTC with labs and CT in 2 months with MD/APP     I spent 20 minutes counseling the patient face to face. The total time spent in the appointment was 25 minutes and more than 50% was on counseling and direct patient cares.    [place] Lone MD [place] AAHIVMS [facility] Hematology/Oncology Physician [facility]  (Office):       [phone] (Work cell):  [phone] (Fax):           [phone]

## 2016-08-15 ENCOUNTER — Encounter (HOSPITAL_COMMUNITY): Payer: Self-pay | Admitting: Lab

## 2016-08-15 NOTE — Progress Notes (Unsigned)
Referral to [facility]/ records faxed on 6/25

## 2016-08-19 ENCOUNTER — Telehealth (HOSPITAL_COMMUNITY): Payer: Self-pay

## 2016-08-19 NOTE — Telephone Encounter (Signed)
Nutrition Assessment   Reason for Assessment:   Patient identified on Malnutrition Screening Report for poor appetite and weight loss  ASSESSMENT:  81 year old female with history of lymphoma currently under observation.  Noted to have right renal pelvic mass and being referred to urology. Past medical history of CHF, COPD, DM, diverticulosis, GERD, HTN, HLD  Spoke with patient via phone this am.  Reports that she is doing fine.  "I am eating all I want."  "I know I am not eating as much as I use too but I am eating enough."  "I have a lot going on and I am not concerned about my eating right now." I want to know what is on my kidney and what I am dealing with.  Patient reports she typically has peanut butter nabs for breakfast and has been doing this for years, eats a lunchable for lunch and then daughter cooks dinner. Typically a meat and vegetables.  Could here daughter in the background saying but you don't eat it.  Report she may eat a chicken leg and some of the vegetables.   Reports that she is in pain and takes pain medication for that which she does not like to do. Reports that she is now on oxygen 24 hours after recent hospital admission.  Nutrition Focused Physical Exam: deferred  Medications: insulin, Vit B 12  Labs: reviewed  Anthropometrics:   Height: 61 inches Weight: 119 lb UBW: 127 lb Noted at 129lb last June BMI: 22  7% weight loss in the last year, not significant    NUTRITION DIAGNOSIS: Inadequate oral intake related to pain, shortness of breath, worry regarding new renal mass as evidenced by 7% weight loss in the last year   MALNUTRITION DIAGNOSIS: none at this time   INTERVENTION:   Discussed importance of good nutrition for overall health. Encouraged good sources of protein and nutrient dense foods (examples provided) Encouraged patient to try oral nutrition supplement 1-2 per day to provide additional nutrition Contact information given to patient      MONITORING, EVALUATION, GOAL: weight, intake   NEXT VISIT: as needed.  Patient to contact me  Kyllie Pettijohn B. Zenia Resides, [place], [place] Registered Dietitian [phone] (pager)

## 2016-09-09 DIAGNOSIS — R0602 Shortness of breath: Secondary | ICD-10-CM | POA: Diagnosis not present

## 2016-09-09 DIAGNOSIS — R448 Other symptoms and signs involving general sensations and perceptions: Secondary | ICD-10-CM | POA: Diagnosis not present

## 2016-10-03 DIAGNOSIS — I10 Essential (primary) hypertension: Secondary | ICD-10-CM | POA: Diagnosis not present

## 2016-10-03 DIAGNOSIS — F411 Generalized anxiety disorder: Secondary | ICD-10-CM | POA: Diagnosis not present

## 2016-10-03 DIAGNOSIS — E1165 Type 2 diabetes mellitus with hyperglycemia: Secondary | ICD-10-CM | POA: Diagnosis not present

## 2016-10-03 DIAGNOSIS — R309 Painful micturition, unspecified: Secondary | ICD-10-CM | POA: Diagnosis not present

## 2016-10-03 DIAGNOSIS — R109 Unspecified abdominal pain: Secondary | ICD-10-CM | POA: Diagnosis not present

## 2016-10-03 DIAGNOSIS — N2889 Other specified disorders of kidney and ureter: Secondary | ICD-10-CM | POA: Diagnosis not present

## 2016-10-03 DIAGNOSIS — Z6824 Body mass index (BMI) 24.0-24.9, adult: Secondary | ICD-10-CM | POA: Diagnosis not present

## 2016-10-10 DIAGNOSIS — R0602 Shortness of breath: Secondary | ICD-10-CM | POA: Diagnosis not present

## 2016-10-10 DIAGNOSIS — R448 Other symptoms and signs involving general sensations and perceptions: Secondary | ICD-10-CM | POA: Diagnosis not present

## 2016-10-11 ENCOUNTER — Emergency Department (HOSPITAL_COMMUNITY)
Admission: EM | Admit: 2016-10-11 | Discharge: 2016-10-11 | Disposition: A | Discharge status: Home / Self Care | Payer: PPO | Source: Home / Self Care

## 2016-10-11 ENCOUNTER — Encounter (HOSPITAL_COMMUNITY): Payer: PPO | Attending: Oncology

## 2016-10-11 ENCOUNTER — Ambulatory Visit (HOSPITAL_COMMUNITY)
Admission: RE | Admit: 2016-10-11 | Discharge: 2016-10-11 | Disposition: A | Discharge status: Home / Self Care | Payer: PPO | Source: Ambulatory Visit | Attending: Hematology | Admitting: Hematology

## 2016-10-11 ENCOUNTER — Encounter (HOSPITAL_COMMUNITY): Payer: Self-pay | Admitting: Cardiology

## 2016-10-11 DIAGNOSIS — J449 Chronic obstructive pulmonary disease, unspecified: Secondary | ICD-10-CM | POA: Insufficient documentation

## 2016-10-11 DIAGNOSIS — I2510 Atherosclerotic heart disease of native coronary artery without angina pectoris: Secondary | ICD-10-CM | POA: Diagnosis not present

## 2016-10-11 DIAGNOSIS — G8929 Other chronic pain: Secondary | ICD-10-CM | POA: Diagnosis not present

## 2016-10-11 DIAGNOSIS — F329 Major depressive disorder, single episode, unspecified: Secondary | ICD-10-CM | POA: Diagnosis not present

## 2016-10-11 DIAGNOSIS — F419 Anxiety disorder, unspecified: Secondary | ICD-10-CM | POA: Diagnosis not present

## 2016-10-11 DIAGNOSIS — Z5321 Procedure and treatment not carried out due to patient leaving prior to being seen by health care provider: Secondary | ICD-10-CM

## 2016-10-11 DIAGNOSIS — I700 Atherosclerosis of aorta: Secondary | ICD-10-CM | POA: Diagnosis not present

## 2016-10-11 DIAGNOSIS — I509 Heart failure, unspecified: Secondary | ICD-10-CM | POA: Diagnosis not present

## 2016-10-11 DIAGNOSIS — N200 Calculus of kidney: Secondary | ICD-10-CM | POA: Diagnosis not present

## 2016-10-11 DIAGNOSIS — N2889 Other specified disorders of kidney and ureter: Secondary | ICD-10-CM

## 2016-10-11 DIAGNOSIS — Z794 Long term (current) use of insulin: Secondary | ICD-10-CM | POA: Insufficient documentation

## 2016-10-11 DIAGNOSIS — Z87442 Personal history of urinary calculi: Secondary | ICD-10-CM | POA: Diagnosis not present

## 2016-10-11 DIAGNOSIS — I110 Hypertensive heart disease with heart failure: Secondary | ICD-10-CM | POA: Diagnosis not present

## 2016-10-11 DIAGNOSIS — R062 Wheezing: Secondary | ICD-10-CM

## 2016-10-11 DIAGNOSIS — R634 Abnormal weight loss: Secondary | ICD-10-CM | POA: Insufficient documentation

## 2016-10-11 DIAGNOSIS — R109 Unspecified abdominal pain: Secondary | ICD-10-CM | POA: Diagnosis not present

## 2016-10-11 DIAGNOSIS — E785 Hyperlipidemia, unspecified: Secondary | ICD-10-CM | POA: Insufficient documentation

## 2016-10-11 DIAGNOSIS — Z7982 Long term (current) use of aspirin: Secondary | ICD-10-CM | POA: Insufficient documentation

## 2016-10-11 DIAGNOSIS — D491 Neoplasm of unspecified behavior of respiratory system: Secondary | ICD-10-CM | POA: Diagnosis not present

## 2016-10-11 DIAGNOSIS — M1990 Unspecified osteoarthritis, unspecified site: Secondary | ICD-10-CM | POA: Insufficient documentation

## 2016-10-11 DIAGNOSIS — E119 Type 2 diabetes mellitus without complications: Secondary | ICD-10-CM | POA: Insufficient documentation

## 2016-10-11 DIAGNOSIS — C8207 Follicular lymphoma grade I, spleen: Secondary | ICD-10-CM

## 2016-10-11 DIAGNOSIS — K219 Gastro-esophageal reflux disease without esophagitis: Secondary | ICD-10-CM | POA: Diagnosis not present

## 2016-10-11 LAB — CBC WITH DIFFERENTIAL/PLATELET
BASOS ABS: 0 10*3/uL (ref 0.0–0.1)
BASOS PCT: 0 %
Eosinophils Absolute: 0.2 10*3/uL (ref 0.0–0.7)
Eosinophils Relative: 2 %
HEMATOCRIT: 39 % (ref 36.0–46.0)
Hemoglobin: 13.1 g/dL (ref 12.0–15.0)
Lymphocytes Relative: 24 %
Lymphs Abs: 2.8 10*3/uL (ref 0.7–4.0)
MCH: 30.9 pg (ref 26.0–34.0)
MCHC: 33.6 g/dL (ref 30.0–36.0)
MCV: 92 fL (ref 78.0–100.0)
MONO ABS: 1.3 10*3/uL — AB (ref 0.1–1.0)
Monocytes Relative: 11 %
NEUTROS ABS: 7.4 10*3/uL (ref 1.7–7.7)
NEUTROS PCT: 63 %
Platelets: 239 10*3/uL (ref 150–400)
RBC: 4.24 MIL/uL (ref 3.87–5.11)
RDW: 13.9 % (ref 11.5–15.5)
WBC: 11.7 10*3/uL — ABNORMAL HIGH (ref 4.0–10.5)

## 2016-10-11 LAB — COMPREHENSIVE METABOLIC PANEL
ALBUMIN: 4 g/dL (ref 3.5–5.0)
ALT: 11 U/L — ABNORMAL LOW (ref 14–54)
AST: 13 U/L — AB (ref 15–41)
Alkaline Phosphatase: 69 U/L (ref 38–126)
Anion gap: 4 — ABNORMAL LOW (ref 5–15)
BILIRUBIN TOTAL: 0.9 mg/dL (ref 0.3–1.2)
BUN: 12 mg/dL (ref 6–20)
CO2: 35 mmol/L — AB (ref 22–32)
Calcium: 9.6 mg/dL (ref 8.9–10.3)
Chloride: 100 mmol/L — ABNORMAL LOW (ref 101–111)
Creatinine, Ser: 0.66 mg/dL (ref 0.44–1.00)
GFR calc Af Amer: >60 mL/min (ref >60)
GFR calc non Af Amer: >60 mL/min (ref >60)
GLUCOSE: 70 mg/dL (ref 65–99)
POTASSIUM: 3.8 mmol/L (ref 3.5–5.1)
SODIUM: 139 mmol/L (ref 135–145)
Total Protein: 6.9 g/dL (ref 6.5–8.1)

## 2016-10-11 LAB — CBG MONITORING, ED: Glucose-Capillary: 83 mg/dL (ref 65–99)

## 2016-10-11 LAB — LACTATE DEHYDROGENASE: LDH: 136 U/L (ref 98–192)

## 2016-10-11 MED ORDER — IOPAMIDOL (ISOVUE-300) INJECTION 61%
125.0000 mL | Freq: Once | INTRAVENOUS | Status: AC | PRN
  Administered 2016-10-11: 125 mL via INTRAVENOUS

## 2016-10-11 NOTE — ED Triage Notes (Addendum)
SOB that started here while having OP CT scan today.  Also c/o low sugar this morning.

## 2016-10-11 NOTE — ED Notes (Signed)
Pt on home oxygen and uses breathing treatment's at home.  States she kept holding her breath for the CT scan and got increasingly SOB while walking from CT to waiting area.  Pt wheezing some in triage.  No visible SOB.   States she is going to go home and eat, take pain pill and breathing treatment.

## 2016-10-13 ENCOUNTER — Encounter (HOSPITAL_COMMUNITY): Payer: PPO | Attending: Oncology | Admitting: Oncology

## 2016-10-13 ENCOUNTER — Other Ambulatory Visit: Payer: Self-pay

## 2016-10-13 ENCOUNTER — Emergency Department (HOSPITAL_COMMUNITY): Payer: PPO

## 2016-10-13 ENCOUNTER — Encounter (HOSPITAL_COMMUNITY): Payer: Self-pay | Admitting: Oncology

## 2016-10-13 ENCOUNTER — Emergency Department (HOSPITAL_COMMUNITY)
Admission: EM | Admit: 2016-10-13 | Discharge: 2016-10-14 | Disposition: A | Discharge status: Home / Self Care | Payer: PPO | Attending: Emergency Medicine | Admitting: Emergency Medicine

## 2016-10-13 ENCOUNTER — Encounter (HOSPITAL_COMMUNITY): Payer: Self-pay | Admitting: Emergency Medicine

## 2016-10-13 ENCOUNTER — Other Ambulatory Visit (HOSPITAL_COMMUNITY): Payer: PPO

## 2016-10-13 VITALS — BP 163/62 | HR 77 | Temp 97.9°F | Resp 22 | Wt 125.0 lb

## 2016-10-13 DIAGNOSIS — Z794 Long term (current) use of insulin: Secondary | ICD-10-CM | POA: Diagnosis not present

## 2016-10-13 DIAGNOSIS — R0600 Dyspnea, unspecified: Secondary | ICD-10-CM | POA: Diagnosis not present

## 2016-10-13 DIAGNOSIS — Z79899 Other long term (current) drug therapy: Secondary | ICD-10-CM | POA: Insufficient documentation

## 2016-10-13 DIAGNOSIS — I110 Hypertensive heart disease with heart failure: Secondary | ICD-10-CM | POA: Insufficient documentation

## 2016-10-13 DIAGNOSIS — Z8579 Personal history of other malignant neoplasms of lymphoid, hematopoietic and related tissues: Secondary | ICD-10-CM

## 2016-10-13 DIAGNOSIS — R0602 Shortness of breath: Secondary | ICD-10-CM | POA: Diagnosis not present

## 2016-10-13 DIAGNOSIS — C8510 Unspecified B-cell lymphoma, unspecified site: Secondary | ICD-10-CM | POA: Diagnosis not present

## 2016-10-13 DIAGNOSIS — E119 Type 2 diabetes mellitus without complications: Secondary | ICD-10-CM | POA: Insufficient documentation

## 2016-10-13 DIAGNOSIS — C8282 Other types of follicular lymphoma, intrathoracic lymph nodes: Secondary | ICD-10-CM

## 2016-10-13 DIAGNOSIS — R1900 Intra-abdominal and pelvic swelling, mass and lump, unspecified site: Secondary | ICD-10-CM

## 2016-10-13 DIAGNOSIS — J449 Chronic obstructive pulmonary disease, unspecified: Secondary | ICD-10-CM | POA: Diagnosis not present

## 2016-10-13 DIAGNOSIS — I509 Heart failure, unspecified: Secondary | ICD-10-CM | POA: Diagnosis not present

## 2016-10-13 DIAGNOSIS — F17210 Nicotine dependence, cigarettes, uncomplicated: Secondary | ICD-10-CM | POA: Diagnosis not present

## 2016-10-13 DIAGNOSIS — R05 Cough: Secondary | ICD-10-CM | POA: Diagnosis not present

## 2016-10-13 DIAGNOSIS — Z7982 Long term (current) use of aspirin: Secondary | ICD-10-CM | POA: Diagnosis not present

## 2016-10-13 DIAGNOSIS — C3490 Malignant neoplasm of unspecified part of unspecified bronchus or lung: Secondary | ICD-10-CM | POA: Diagnosis not present

## 2016-10-13 DIAGNOSIS — R069 Unspecified abnormalities of breathing: Secondary | ICD-10-CM | POA: Diagnosis not present

## 2016-10-13 LAB — COMPREHENSIVE METABOLIC PANEL
ALT: 10 U/L — ABNORMAL LOW (ref 14–54)
ANION GAP: 5 (ref 5–15)
AST: 14 U/L — ABNORMAL LOW (ref 15–41)
Albumin: 4.1 g/dL (ref 3.5–5.0)
Alkaline Phosphatase: 66 U/L (ref 38–126)
BUN: 15 mg/dL (ref 6–20)
CALCIUM: 9.4 mg/dL (ref 8.9–10.3)
CHLORIDE: 98 mmol/L — AB (ref 101–111)
CO2: 34 mmol/L — AB (ref 22–32)
Creatinine, Ser: 0.72 mg/dL (ref 0.44–1.00)
GFR calc non Af Amer: >60 mL/min (ref >60)
Glucose, Bld: 129 mg/dL — ABNORMAL HIGH (ref 65–99)
Potassium: 4.2 mmol/L (ref 3.5–5.1)
SODIUM: 137 mmol/L (ref 135–145)
Total Bilirubin: 0.7 mg/dL (ref 0.3–1.2)
Total Protein: 7 g/dL (ref 6.5–8.1)

## 2016-10-13 LAB — CBC WITH DIFFERENTIAL/PLATELET
Basophils Absolute: 0 10*3/uL (ref 0.0–0.1)
Basophils Relative: 0 %
EOS ABS: 0.2 10*3/uL (ref 0.0–0.7)
EOS PCT: 2 %
HCT: 39 % (ref 36.0–46.0)
Hemoglobin: 12.9 g/dL (ref 12.0–15.0)
LYMPHS ABS: 2.3 10*3/uL (ref 0.7–4.0)
Lymphocytes Relative: 19 %
MCH: 30.9 pg (ref 26.0–34.0)
MCHC: 33.1 g/dL (ref 30.0–36.0)
MCV: 93.3 fL (ref 78.0–100.0)
MONO ABS: 1 10*3/uL (ref 0.1–1.0)
MONOS PCT: 8 %
Neutro Abs: 8.5 10*3/uL — ABNORMAL HIGH (ref 1.7–7.7)
Neutrophils Relative %: 71 %
PLATELETS: 265 10*3/uL (ref 150–400)
RBC: 4.18 MIL/uL (ref 3.87–5.11)
RDW: 13.8 % (ref 11.5–15.5)
WBC: 12 10*3/uL — AB (ref 4.0–10.5)

## 2016-10-13 LAB — URINALYSIS, ROUTINE W REFLEX MICROSCOPIC
BILIRUBIN URINE: NEGATIVE
Glucose, UA: NEGATIVE mg/dL
Hgb urine dipstick: NEGATIVE
Ketones, ur: NEGATIVE mg/dL
Leukocytes, UA: NEGATIVE
NITRITE: NEGATIVE
PROTEIN: NEGATIVE mg/dL
SPECIFIC GRAVITY, URINE: 1.01 (ref 1.005–1.030)
pH: 8 (ref 5.0–8.0)

## 2016-10-13 LAB — TROPONIN I: Troponin I: 0.03 ng/mL (ref <0.03)

## 2016-10-13 MED ORDER — FENTANYL CITRATE (PF) 100 MCG/2ML IJ SOLN
INTRAMUSCULAR | Status: AC
  Filled 2016-10-13: qty 2

## 2016-10-13 MED ORDER — FENTANYL CITRATE (PF) 100 MCG/2ML IJ SOLN
50.0000 ug | Freq: Once | INTRAMUSCULAR | Status: AC
  Administered 2016-10-13: 50 ug via INTRAVENOUS
  Filled 2016-10-13: qty 2

## 2016-10-13 MED ORDER — IOPAMIDOL (ISOVUE-370) INJECTION 76%
75.0000 mL | Freq: Once | INTRAVENOUS | Status: AC | PRN
  Administered 2016-10-13: 75 mL via INTRAVENOUS

## 2016-10-13 MED ORDER — FENTANYL CITRATE (PF) 100 MCG/2ML IJ SOLN
50.0000 ug | Freq: Once | INTRAMUSCULAR | Status: AC
  Administered 2016-10-13: 50 ug via INTRAVENOUS

## 2016-10-13 MED ORDER — SODIUM CHLORIDE 0.9 % IV BOLUS (SEPSIS)
500.0000 mL | Freq: Once | INTRAVENOUS | Status: AC
  Administered 2016-10-13: 500 mL via INTRAVENOUS

## 2016-10-13 NOTE — ED Triage Notes (Addendum)
Pt reports increased shortness of breath that started today. Pt reports intermittent congested cough. Pt reports is usually on 2 liters [place] but reports increased to 4 liters this afternoon. Pt reports lost balance this afternoon after bending over. Pt refused EMS at time of fall. Pt was seen today in cancer center. Pt reports is not currently undergoing chemo/radiation for "spot on kidney."

## 2016-10-13 NOTE — Progress Notes (Signed)
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 Sondra Come completed on 05/08/2012.  CURRENT THERAPY: Observation  SUMMARY OF ONCOLOGIC HISTORY:   Non Hodgkin's lymphoma ([place])   11/28/2012 Initial Diagnosis    Non Hodgkin's lymphoma ([place])     08/11/2016 PET scan    1. There are no scratch set no hypermetabolic mass or adenopathy identified suggestive of residual or recurrent lymphoma. 2. With respect to the abnormal increase soft tissue associated with the right renal collecting system, there is inherently intense physiologic radiotracer activity within the renal collecting systems on FDG PET imaging. Suggest followup imaging with a repeat hematuria protocol CT in 3-6 months (from 06/22/2016) to confirm persistence of abnormal soft tissue within this area. If persistent then biopsy may be indicated. 3. Aortic Atherosclerosis (ICD10-I70.0) and Emphysema (ICD10-J43.9). Coronary artery calcifications noted.       INTERVAL HISTORY:  Patient is here for f/u. She is accompanied by her daughter who states that patient has had multiple falls recently. She's been also having chest tightness and shortness of breath but refuses to go to the ER. She complains of shortness of breath increased from her baseline. She denies any chest pain. She denies any nausea, vomiting, diarrhea, abdominal pain.  REVIEW OF SYSTEMS:    10 Point review of systems of done and is negative except as noted above.  . Past Medical History:  Diagnosis Date  . Adenomatous colon polyp   . Anxiety   . Benign tumor of breast 2004   left  . Broken ribs    history of several broken ribs on right and compression  fx  . C. difficile colitis 06/21/2014  . Cancer ([place])    non-hodgkins b cell lymphoma  . CHF (congestive heart failure) ([place])    hosp. for CHF- 10/2013  . Chronic abdominal pain   . Cold virus    seen by Dr. Deborra Medina on 10/12, given Rx for antibiotic & steroid  . COPD (chronic obstructive pulmonary disease) ([place])   . Depression   . Diabetes mellitus without complication ([place])   . Diverticulosis   . Fibroma of skin    Multiple fibromas/lipomas on arms and legs B  . Gait instability   . GERD (gastroesophageal reflux disease)   . History of CT scan of abdomen 05/23/2003   Mild fatty liver inf, tiny hypodensity in right lobe of liver  . Hx of radiation therapy 04/11/12- 05/08/12   left chest wall area, 40 gray 20 fx  . Hyperlipidemia   . Hypertension   . Kidney stone 02/21/1981   Right/removed  . Lung cancer ([place])    pulmonary lymphoid neoplasm questionable for non-Hodgkins lymphoma  . Osteoarthritis    hands, degenerative back , osteoporosis   . Palpitations   . Pulmonary nodules   . Pyelonephritis   . Shortness of breath    only with movemnent   . Tremor   . UTI (lower urinary tract infection)   . Weight loss    "multifactorial" per PMD note    . Past Surgical History:  Procedure Laterality Date  . ABDOMINAL HYSTERECTOMY  02/21/1970   right ovary remaining  . APPENDECTOMY  02/21/1961  . BONE MARROW BIOPSY  03/09/2012  . BREAST SURGERY  1970's   lump removed- benign   . CHOLECYSTECTOMY N/A 12/11/2013   Procedure: LAPAROSCOPIC CHOLECYSTECTOMY WITH INTRAOPERATIVE CHOLANGIOGRAM;  Surgeon: Erroll Luna, MD;  Location: [facility];  Service: General;  Laterality: N/A;  . EXPLORATORY LAPAROTOMY  02/21/1970   for ? adhesions  . EYE SURGERY     bilateral cataracts removed - /W- IOL  . lung core biopsy  02/06/2012  . MASS EXCISION Right 04/21/2015   Procedure: EXCISION RIGHT THIGH  MASS;  Surgeon: Erroll Luna, MD;  Location:  [facility];  Service: General;  Laterality: Right;  . TONSILLECTOMY    . TUBAL LIGATION      . Social History  Substance Use Topics  . Smoking status: Current Every Day Smoker    Packs/day: 0.25    Years: 30.00    Types: Cigarettes  . Smokeless tobacco: Never Used     Comment: trying to quit smokes about 2-3 cigs per day, e-cigs with no nicotine  . Alcohol use No    ALLERGIES:  is allergic to sulfonamide derivatives and metformin and related.  MEDICATIONS:  Current Outpatient Prescriptions  Medication Sig Dispense Refill  . ALPRAZolam (XANAX) 0.5 MG tablet Take 0.5-1 mg by mouth 2 (two) times daily. One tablet daily and two at bedtime    . aspirin EC 81 MG tablet Take 81 mg by mouth every morning.    . Blood Glucose Monitoring Suppl ([place]) W/DEVICE KIT Use as directed to test blood sugar three times daily Dx:R73.09 1 each 0  . budesonide-formoterol (SYMBICORT) 160-4.5 MCG/ACT inhaler Inhale 2 puffs into the lungs 2 (two) times daily. (Patient taking differently: Inhale 2 puffs into the lungs daily as needed (for shortness of breath). ) 1 Inhaler 12  . citalopram (CELEXA) 40 MG tablet Take 1 tablet (40 mg total) by mouth daily. (Patient taking differently: Take 40 mg by mouth every evening. ) 90 tablet 3  . guaiFENesin (MUCINEX) 600 MG 12 hr tablet Take 2 tablets (1,200 mg total) by mouth 2 (two) times daily. 30 tablet 0  . HYDROcodone-acetaminophen (NORCO) 7.5-325 MG tablet     . Insulin Pen Needle 32G X 4 MM MISC Use to inject 4x daily. Relion Brand 130 each 4  . Insulin Syringe-Needle U-100 (INSULIN SYRINGE .3CC/31GX5/16") 31G X 5/16" 0.3 ML MISC Use to inject insulin 3 times daily as instructed. 100 each 5  . losartan (COZAAR) 50 MG tablet TAKE ONE TABLET BY MOUTH ONCE DAILY IN THE MORNING 30 tablet 0  . metoprolol succinate (TOPROL-XL) 25 MG 24 hr tablet Take 25 mg by mouth 2 (two) times daily.    . ONE TOUCH LANCETS MISC Use as directed to test  blood sugar three times daily Dx:R73.09 300 each 2  . ONE TOUCH ULTRA TEST test strip USE ONE STRIP TO CHECK GLUCOSE THREE TIMES DAILY 100 each 11  . OXYGEN Inhale 2 L into the lungs continuous.    . TRESIBA FLEXTOUCH 200 UNIT/ML SOPN Inject 15 Units into the skin every morning.     . vitamin B-12 (CYANOCOBALAMIN) 500 MCG tablet Take 1,000 mcg by mouth daily.      No current facility-administered medications for this visit.     PHYSICAL EXAMINATION: ECOG PERFORMANCE STATUS: 3 - Symptomatic, >50% confined to bed  . Vitals:   10/13/16 1540  BP: (!) 163/62  Pulse: 77  Resp: (!) 22  Temp: 97.9 F (  36.6 C)  SpO2: 97%    Filed Weights   10/13/16 1540  Weight: 125 lb (56.7 kg)   .Body mass index is 23.62 kg/m.  GENERAL:alert, in no acute distress and comfortable SKIN: no acute rashes, no significant lesions EYES: conjunctiva are pink and non-injected, sclera anicteric OROPHARYNX: MMM, no exudates, no oropharyngeal erythema or ulceration NECK: supple, no JVD LYMPH:  no palpable lymphadenopathy in the cervical, axillary or inguinal regions LUNGS: Chest sounds very tight with bilateral wheezing. HEART: regular rate & rhythm ABDOMEN:  normoactive bowel sounds , non tender, not distended. Extremity: no pedal edema PSYCH: alert & oriented x 3 with fluent speech NEURO: no focal motor/sensory deficits  LABORATORY DATA:   I have reviewed the data as listed  . CBC Latest Ref Rng & Units 10/11/2016 08/03/2016 07/09/2016  WBC 4.0 - 10.5 K/uL 11.7(H) 8.1 4.8  Hemoglobin 12.0 - 15.0 g/dL 13.1 13.0 13.7  Hematocrit 36.0 - 46.0 % 39.0 40.0 42.0  Platelets 150 - 400 K/uL 239 245 322    . CMP Latest Ref Rng & Units 10/11/2016 08/03/2016 07/09/2016  Glucose 65 - 99 mg/dL 70 85 277(H)  BUN 6 - 20 mg/dL 12 17 23(H)  Creatinine 0.44 - 1.00 mg/dL 0.66 0.77 0.85  Sodium 135 - 145 mmol/L 139 140 139  Potassium 3.5 - 5.1 mmol/L 3.8 4.2 4.6  Chloride 101 - 111 mmol/L 100(L) 100(L) 103  CO2 22  - 32 mmol/L 35(H) 34(H) 29  Calcium 8.9 - 10.3 mg/dL 9.6 9.3 9.1  Total Protein 6.5 - 8.1 g/dL 6.9 6.5 -  Total Bilirubin 0.3 - 1.2 mg/dL 0.9 0.5 -  Alkaline Phos 38 - 126 U/L 69 62 -  AST 15 - 41 U/L 13(L) 14(L) -  ALT 14 - 54 U/L 11(L) 12(L) -     RADIOGRAPHIC STUDIES: I have personally reviewed the radiological images as listed and agreed with the findings in the report. Ct Hematuria Workup  Result Date: 10/11/2016 CLINICAL DATA:  Abnormal right renal collecting system. History of lymphoma. Follow-up of PET and CT. EXAM: CT ABDOMEN AND PELVIS WITHOUT AND WITH CONTRAST TECHNIQUE: Multidetector CT imaging of the abdomen and pelvis was performed following the standard protocol before and following the bolus administration of intravenous contrast. CONTRAST:  133m ISOVUE-300 IOPAMIDOL (ISOVUE-300) INJECTION 61% 125 cc of Isovue-300 COMPARISON:  PET 08/10/2016.  CT 06/22/2016 FINDINGS: Lower chest: Minimal anterior left lung base subpleural fibrosis is not significantly changed. Normal heart size without pericardial or pleural effusion. Right coronary artery atherosclerosis. Hepatobiliary: Normal liver. Cholecystectomy, without biliary ductal dilatation. Pancreas: Normal, without mass or ductal dilatation. Spleen: Old granulomatous disease in the spleen. Adrenals/Urinary Tract: Normal adrenal glands. 4 mm lower pole left renal collecting system calculus. No right-sided renal calculi or hydronephrosis. No hydroureter or ureteric calculi. No bladder calculi. Right renal low-density lesions are fluid density and likely cysts. Interpolar left renal lesion is similar in size and well-circumscribed at 9 mm on image 20/series 12. Measures slightly greater than fluid density. Similarly, a lower pole left renal lesion is too small to characterize at 8 mm on image 29/series 2. This may have enlarged minimally since 06/22/2016, and measures slightly greater than fluid density. Abnormal soft tissue about the  superior aspect of the right renal pelvis is again identified. Measures on the order of 2.5 x 1.7 cm on image 23/series 6. This is similar to 2.8 x 1.7 cm on the prior exam. No intraluminal collecting system or ureteric filling defects identified.  The mid and distal ureters are not well opacified. No enhancing bladder mass. No dependent bladder filling defect on delayed images. Stomach/Bowel: Normal stomach, without wall thickening. Scattered colonic diverticula. Normal small bowel. Vascular/Lymphatic: Aortic and branch vessel atherosclerosis. No abdominopelvic adenopathy. Reproductive: Hysterectomy.  No adnexal mass. Other: No significant free fluid. No evidence of omental or peritoneal disease. Musculoskeletal: Osteopenia. Mild superior endplate compression deformity at T11 is similar to on the prior. IMPRESSION: 1. Slightly greater than 3 months stability of an area of enhancing soft tissue about the superior aspect of the right renal pelvis. Short term stability and absence of lymphoma elsewhere on prior PET suggests this may represent a benign entity such as an unusual appearance of retroperitoneal fibrosis. However, recurrent lymphoma or less likely urothelial carcinoma cannot be excluded. Consider imaging surveillance with CT or MRI at 6 months (see below). 2. Indeterminate left renal lesions are most likely small cysts or complex cysts but are indeterminate. These could be re-evaluated with pre and post contrast abdominal MRI at 6 months. 3. Coronary artery atherosclerosis. Aortic Atherosclerosis (ICD10-I70.0). 4. Left nephrolithiasis. Electronically Signed   By: Abigail Miyamoto M.D.   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 Sondra Come.   The recent PET/CT scan showed no findings to suggest residual or recurrent  disease. Plan -Clinically NED. -continue clinical surveillance.  2) COPD: Continue current treatment with albuterol inhaler. On [place] O2 2L/min -counseled on smoking cessation. Patient continues to smoke. -Patient may be having a COPD exacerbation given her pulmonary findings and symptoms. I therefore I have urged her to go to the ED after her visit, patient states she will be going.  3) RT renal pelvis mass noted on previous CT. -Clinically stable. We'll repeat a PET/CT prior to her next visit for continued evaluation.  Orders Placed This Encounter  Procedures  . NM PET Image Restag (PS) Skull Base To Thigh    Standing Status:   Future    Standing Expiration Date:   10/13/2017    Order Specific Question:   If indicated for the ordered procedure, I authorize the administration of a radiopharmaceutical per Radiology protocol    Answer:   Yes    Order Specific Question:   Preferred imaging location?    Answer:   [facility]    Order Specific Question:   Radiology Contrast Protocol - do NOT remove file path    Answer:   [email or web address]    Order Specific Question:   Reason for Exam additional comments    Answer:   history of lymphoid mass in lung treated with RT, follow up for abnormal soft tissue about the superior aspect of the right renal  . CBC with Differential    Standing Status:   Future    Standing Expiration Date:   10/13/2017  . Comprehensive metabolic panel    Standing Status:   Future    Standing Expiration Date:   10/13/2017  . Lactate dehydrogenase    Standing Status:   Future    Standing Expiration Date:   10/13/2017    Twana First, MD

## 2016-10-14 LAB — GLUCOSE, CAPILLARY: Glucose-Capillary: 209 mg/dL — ABNORMAL HIGH (ref 65–99)

## 2016-10-14 LAB — TROPONIN I: Troponin I: <0.03 ng/mL (ref <0.03)

## 2016-10-14 MED ORDER — OXYCODONE-ACETAMINOPHEN 5-325 MG PO TABS
1.0000 | ORAL_TABLET | Freq: Once | ORAL | Status: AC
  Administered 2016-10-14: 1 via ORAL
  Filled 2016-10-14: qty 1

## 2016-10-14 MED ORDER — ONDANSETRON HCL 4 MG PO TABS: 4.0000 mg | ORAL_TABLET | Freq: Four times a day (QID) | ORAL | 0 refills | Status: AC

## 2016-10-14 MED ORDER — OXYCODONE-ACETAMINOPHEN 5-325 MG PO TABS: 1.0000 | ORAL_TABLET | ORAL | 0 refills | Status: DC | PRN

## 2016-10-14 MED ORDER — ONDANSETRON HCL 4 MG/2ML IJ SOLN: 4.0000 mg | Freq: Once | INTRAMUSCULAR | Status: DC

## 2016-10-14 MED ORDER — FENTANYL CITRATE (PF) 100 MCG/2ML IJ SOLN: 50.0000 ug | Freq: Once | INTRAMUSCULAR | Status: DC

## 2016-10-14 MED ORDER — ONDANSETRON 4 MG PO TBDP
4.0000 mg | ORAL_TABLET | Freq: Once | ORAL | Status: AC
  Administered 2016-10-14: 4 mg via ORAL
  Filled 2016-10-14: qty 1

## 2016-10-14 NOTE — ED Provider Notes (Signed)
Pt left at change of shift to get results of her delta troponin. She reports she gets episodes where she will get acutely SOB and diaphoretic. She is on home oxygen and still smokes. She has had lung cancer before. She states she is feeling okay right now.   Results for orders placed or performed during the hospital encounter of 10/13/16  CBC with Differential  Result Value Ref Range   WBC 12.0 (H) 4.0 - 10.5 K/uL   RBC 4.18 3.87 - 5.11 MIL/uL   Hemoglobin 12.9 12.0 - 15.0 g/dL   HCT 39.0 36.0 - 46.0 %   MCV 93.3 78.0 - 100.0 fL   MCH 30.9 26.0 - 34.0 pg   MCHC 33.1 30.0 - 36.0 g/dL   RDW 13.8 11.5 - 15.5 %   Platelets 265 150 - 400 K/uL   Neutrophils Relative % 71 %   Neutro Abs 8.5 (H) 1.7 - 7.7 K/uL   Lymphocytes Relative 19 %   Lymphs Abs 2.3 0.7 - 4.0 K/uL   Monocytes Relative 8 %   Monocytes Absolute 1.0 0.1 - 1.0 K/uL   Eosinophils Relative 2 %   Eosinophils Absolute 0.2 0.0 - 0.7 K/uL   Basophils Relative 0 %   Basophils Absolute 0.0 0.0 - 0.1 K/uL  Comprehensive metabolic panel  Result Value Ref Range   Sodium 137 135 - 145 mmol/L   Potassium 4.2 3.5 - 5.1 mmol/L   Chloride 98 (L) 101 - 111 mmol/L   CO2 34 (H) 22 - 32 mmol/L   Glucose, Bld 129 (H) 65 - 99 mg/dL   BUN 15 6 - 20 mg/dL   Creatinine, Ser 0.72 0.44 - 1.00 mg/dL   Calcium 9.4 8.9 - 10.3 mg/dL   Total Protein 7.0 6.5 - 8.1 g/dL   Albumin 4.1 3.5 - 5.0 g/dL   AST 14 (L) 15 - 41 U/L   ALT 10 (L) 14 - 54 U/L   Alkaline Phosphatase 66 38 - 126 U/L   Total Bilirubin 0.7 0.3 - 1.2 mg/dL   GFR calc non Af Amer >60 >60 mL/min   GFR calc Af Amer >60 >60 mL/min   Anion gap 5 5 - 15  Troponin I  Result Value Ref Range   Troponin I 0.03 (HH) <0.03 ng/mL  Urinalysis, Routine w reflex microscopic  Result Value Ref Range   Color, Urine YELLOW YELLOW   APPearance CLEAR CLEAR   Specific Gravity, Urine 1.010 1.005 - 1.030   pH 8.0 5.0 - 8.0   Glucose, UA NEGATIVE NEGATIVE mg/dL   Hgb urine dipstick NEGATIVE  NEGATIVE   Bilirubin Urine NEGATIVE NEGATIVE   Ketones, ur NEGATIVE NEGATIVE mg/dL   Protein, ur NEGATIVE NEGATIVE mg/dL   Nitrite NEGATIVE NEGATIVE   Leukocytes, UA NEGATIVE NEGATIVE  Troponin I  Result Value Ref Range   Troponin I <0.03 <0.03 ng/mL  Glucose, capillary  Result Value Ref Range   Glucose-Capillary 209 (H) 65 - 99 mg/dL   Laboratory interpretation all normal except leukocytosis, compensatory respiratory alkalosis, negative delta troponin   Dg Chest 2 View  Result Date: 10/13/2016 CLINICAL DATA:  Worsening short of breath and cough EXAM: CHEST  2 VIEW COMPARISON:  PET-CT 08/10/2016 FINDINGS: Normal cardiac silhouette. Chronic bronchitic markings centrally. Mild peripheral airspace disease and pleural thickening at the LEFT lateral lung base similar to comparison CT. No aggressive osseous lesion. IMPRESSION: 1. No clear acute findings. 2. Chronic bronchitic markings. 3. Peripheral pleural thickening and parenchymal reticulation in  the LEFT lower lobe similar to comparison PET-CT. Electronically Signed   By: Suzy Bouchard M.D.   On: 10/13/2016 16:43   Ct Angio Chest Pe W And/or Wo Contrast  Result Date: 10/13/2016 CLINICAL DATA:  Dyspnea. EXAM: CT ANGIOGRAPHY CHEST WITH CONTRAST TECHNIQUE: Multidetector CT imaging of the chest was performed using the standard protocol during bolus administration of intravenous contrast. Multiplanar CT image reconstructions and MIPs were obtained to evaluate the vascular anatomy. CONTRAST:  75 mL of Isovue 370 intravenously. COMPARISON:  Radiographs of same day.  CT scan of April 28, 2015. FINDINGS: Cardiovascular: Satisfactory opacification of the pulmonary arteries to the segmental level. No evidence of pulmonary embolism. Normal heart size. No pericardial effusion. Atherosclerosis of thoracic aorta is noted without aneurysm formation. Mediastinum/Nodes: No enlarged mediastinal, hilar, or axillary lymph nodes. Thyroid gland, trachea, and  esophagus demonstrate no significant findings. Lungs/Pleura: No pneumothorax or pleural effusion is noted. Mild emphysematous disease is noted in the upper lobes bilaterally. Stable subpleural nodule seen in right upper lobe which can be considered benign at this point. 4 mm nodule seen in right upper lobe best seen on image number 29 of series 5. Upper Abdomen: No acute abnormality. Musculoskeletal: No chest wall abnormality. No acute or significant osseous findings. Review of the MIP images confirms the above findings. IMPRESSION: No definite evidence of pulmonary embolus. 4 mm nodule seen in right upper lobe. No follow-up needed if patient is low-risk. Non-contrast chest CT can be considered in 12 months if patient is high-risk. This recommendation follows the consensus statement: Guidelines for Management of Incidental Pulmonary Nodules Detected on CT Images: From the Fleischner Society 2017; Radiology 2017; 284:228-243. Aortic Atherosclerosis (ICD10-I70.0). Electronically Signed   By: Marijo Conception, M.D.   On: 10/13/2016 21:13   Diagnoses that have been ruled out:  None  Diagnoses that are still under consideration:  None  Final diagnoses:  Dyspnea, unspecified type     Plan discharge  Rolland Porter, MD, Barbette Or, MD 10/14/16 [id number]

## 2016-10-14 NOTE — ED Provider Notes (Signed)
[facility] DEPT Provider Note   CSN: [id number] Arrival date & time: 10/13/16  1613     History   Chief Complaint Chief Complaint  Patient presents with  . Shortness of Breath    HPI Marie Holmes is a 81 y.o. female.  Patient report shortness of breath today c intermittent cough. No anterior chest pain. She is on nasal oxygen at home. She normally uses 2 L but today increased to 4 L. Patient was seen today at the cancer center. She is being treated for non-Hodgkin's B-cell lymphoma.  Severity of symptoms is mild to moderate. Nothing makes symptoms better or worse.      Past Medical History:  Diagnosis Date  . Adenomatous colon polyp   . Anxiety   . Benign tumor of breast 2004   left  . Broken ribs    history of several broken ribs on right and compression  fx  . C. difficile colitis 06/21/2014  . Cancer ([place])    non-hodgkins b cell lymphoma  . CHF (congestive heart failure) ([place])    hosp. for CHF- 10/2013  . Chronic abdominal pain   . Cold virus    seen by Dr. Deborra Medina on 10/12, given Rx for antibiotic & steroid  . COPD (chronic obstructive pulmonary disease) ([place])   . Depression   . Diabetes mellitus without complication ([place])   . Diverticulosis   . Fibroma of skin    Multiple fibromas/lipomas on arms and legs B  . Gait instability   . GERD (gastroesophageal reflux disease)   . History of CT scan of abdomen 05/23/2003   Mild fatty liver inf, tiny hypodensity in right lobe of liver  . Hx of radiation therapy 04/11/12- 05/08/12   left chest wall area, 40 gray 20 fx  . Hyperlipidemia   . Hypertension   . Kidney stone 02/21/1981   Right/removed  . Lung cancer ([place])    pulmonary lymphoid neoplasm questionable for non-Hodgkins lymphoma  . Osteoarthritis    hands, degenerative back , osteoporosis   . Palpitations   . Pulmonary nodules   . Pyelonephritis   . Shortness of breath    only with movemnent   . Tremor   . UTI (lower urinary tract infection)   .  Weight loss    "multifactorial" per PMD note    Patient Active Problem List   Diagnosis Date Noted  . COPD with acute exacerbation ([place]) 07/08/2016  . Renal mass, right 07/08/2016  . DNR (do not resuscitate) discussion   . Palliative care encounter   . Goals of care, counseling/discussion   . Dyspnea 07/19/2015  . FTT (failure to thrive) in adult 07/19/2015  . Depression with anxiety 07/19/2015  . Acute exacerbation of chronic obstructive pulmonary disease (COPD) ([place]) 07/19/2015  . Angiolipoma 05/04/2015  . Medicare annual wellness visit, subsequent 03/05/2015  . Leg mass 03/05/2015  . Diabetes mellitus, insulin dependent (IDDM), controlled ([place]) 12/23/2014  . Palpitations 09/15/2014  . Loss of weight 07/22/2014  . Tremor 07/22/2014  . Hematuria 07/01/2014  . Chronic anxiety 06/21/2014  . Skin lesions 02/27/2014  . Tobacco abuse 12/26/2013  . Chronic cholecystitis with calculus 12/11/2013  . Nausea alone 11/08/2013  . Gall bladder pain 11/08/2013  . Bowel incontinence 08/12/2013  . Dysuria 08/12/2013  . Lymphadenitis 06/19/2013  . Back pain 04/11/2013  . Urinary frequency 02/27/2013  . Non Hodgkin's lymphoma ([place]) 11/28/2012  . COLONIC POLYPS 09/24/2008  . HLD (hyperlipidemia) 05/11/2006  . Essential hypertension 05/11/2006  .  Venous (peripheral) insufficiency 05/11/2006  . COPD 05/11/2006  . GASTROESOPHAGEAL REFLUX, NO ESOPHAGITIS 04/20/2006  . IRRITABLE BOWEL SYNDROME 04/20/2006  . OSTEOARTHRITIS, MULTI SITES 04/20/2006  . Osteoporosis, unspecified 04/20/2006  . INSOMNIA NOS 04/20/2006  . INCONTINENCE, URGE 04/20/2006    Past Surgical History:  Procedure Laterality Date  . ABDOMINAL HYSTERECTOMY  02/21/1970   right ovary remaining  . APPENDECTOMY  02/21/1961  . BONE MARROW BIOPSY  03/09/2012  . BREAST SURGERY  1970's   lump removed- benign   . CHOLECYSTECTOMY N/A 12/11/2013   Procedure: LAPAROSCOPIC CHOLECYSTECTOMY WITH INTRAOPERATIVE CHOLANGIOGRAM;   Surgeon: Thomas Cornett, MD;  Location: [facility] OR;  Service: General;  Laterality: N/A;  . EXPLORATORY LAPAROTOMY  02/21/1970   for ? adhesions  . EYE SURGERY     bilateral cataracts removed - /W- IOL  . lung core biopsy  02/06/2012  . MASS EXCISION Right 04/21/2015   Procedure: EXCISION RIGHT THIGH  MASS;  Surgeon: Thomas Cornett, MD;  Location: [facility];  Service: General;  Laterality: Right;  . TONSILLECTOMY    . TUBAL LIGATION      OB History    No data available       Home Medications    Prior to Admission medications   Medication Sig Start Date End Date Taking? Authorizing Provider  albuterol (PROVENTIL) (2.5 MG/3ML) 0.083% nebulizer solution 3 treatmens daily 10/03/16  Yes [provider]  ALPRAZolam (XANAX) 0.5 MG tablet Take 0.5-1 mg by mouth 2 (two) times daily. One tablet daily and two at bedtime 06/14/16  Yes [provider]  aspirin EC 81 MG tablet Take 81 mg by mouth every morning.   Yes [provider]  budesonide-formoterol (SYMBICORT) 160-4.5 MCG/ACT inhaler Inhale 2 puffs into the lungs 2 (two) times daily. Patient taking differently: Inhale 2 puffs into the lungs daily as needed (for shortness of breath).  12/25/14  Yes Hernandez Acosta, Estela Y, MD  citalopram (CELEXA) 40 MG tablet Take 1 tablet (40 mg total) by mouth daily. Patient taking differently: Take 40 mg by mouth every evening.  03/05/15  Yes Aron, Talia M, MD  gabapentin (NEURONTIN) 100 MG capsule Take 1 capsule by mouth 3 (three) times daily. 10/03/16  Yes [provider]  HYDROcodone-acetaminophen (NORCO) 7.5-325 MG tablet  08/01/16  Yes [provider]  losartan (COZAAR) 50 MG tablet TAKE ONE TABLET BY MOUTH ONCE DAILY IN THE MORNING 07/21/15  Yes Aron, Talia M, MD  metoprolol succinate (TOPROL-XL) 25 MG 24 hr tablet Take 25 mg by mouth 2 (two) times daily. 06/14/16  Yes [provider]  OXYGEN Inhale 2 L into the lungs continuous.   Yes  [provider]  TRESIBA FLEXTOUCH 200 UNIT/ML SOPN Inject 15 Units into the skin every morning.  06/08/16  Yes [provider]  vitamin B-12 (CYANOCOBALAMIN) 500 MCG tablet Take 1,000 mcg by mouth daily.    Yes [provider]    Family History Family History  Problem Relation Age of Onset  . Parkinsonism Brother   . Kidney disease Mother   . Stroke Father   . Clotting disorder Father   . Cancer Sister   . Heart disease Brother     Social History Social History  Substance Use Topics  . Smoking status: Current Every Day Smoker    Packs/day: 0.25    Years: 30.00    Types: Cigarettes  . Smokeless tobacco: Never Used     Comment: trying to quit smokes about 2-3   cigs per day, e-cigs with no nicotine  . Alcohol use No     Allergies   Sulfonamide derivatives and Metformin and related   Review of Systems Review of Systems  All other systems reviewed and are negative.    Physical Exam Updated Vital Signs BP (!) 148/63   Pulse 74   Temp 98.2 F (36.8 C) (Oral)   Resp (!) 22   Ht 5' 1" (1.549 m)   Wt 56.7 kg (125 lb)   SpO2 98%   BMI 23.62 kg/m   Physical Exam  Constitutional: She is oriented to person, place, and time. She appears well-developed and well-nourished.  HENT:  Head: Normocephalic and atraumatic.  Eyes: Conjunctivae are normal.  Neck: Neck supple.  Cardiovascular: Normal rate and regular rhythm.   Pulmonary/Chest: Effort normal and breath sounds normal.  Abdominal: Soft. Bowel sounds are normal.  Musculoskeletal: Normal range of motion.  Neurological: She is alert and oriented to person, place, and time.  Skin: Skin is warm and dry.  Psychiatric: She has a normal mood and affect. Her behavior is normal.  Nursing note and vitals reviewed.    ED Treatments / Results  Labs (all labs ordered are listed, but only abnormal results are displayed) Labs Reviewed  CBC WITH DIFFERENTIAL/PLATELET - Abnormal; Notable for the  following:       Result Value   WBC 12.0 (*)    Neutro Abs 8.5 (*)    All other components within normal limits  COMPREHENSIVE METABOLIC PANEL - Abnormal; Notable for the following:    Chloride 98 (*)    CO2 34 (*)    Glucose, Bld 129 (*)    AST 14 (*)    ALT 10 (*)    All other components within normal limits  TROPONIN I - Abnormal; Notable for the following:    Troponin I 0.03 (*)    All other components within normal limits  URINALYSIS, ROUTINE W REFLEX MICROSCOPIC  TROPONIN I    EKG  EKG Interpretation  Date/Time:  Thursday October 13 2016 16:19:52 EDT Ventricular Rate:  69 PR Interval:  112 QRS Duration: 70 QT Interval:  390 QTC Calculation: 417 R Axis:   73 Text Interpretation:  Normal sinus rhythm Septal infarct , age undetermined Abnormal ECG Confirmed by Cook, Brian ([id number]) on 10/13/2016 9:06:46 PM       Radiology Dg Chest 2 View  Result Date: 10/13/2016 CLINICAL DATA:  Worsening short of breath and cough EXAM: CHEST  2 VIEW COMPARISON:  PET-CT 08/10/2016 FINDINGS: Normal cardiac silhouette. Chronic bronchitic markings centrally. Mild peripheral airspace disease and pleural thickening at the LEFT lateral lung base similar to comparison CT. No aggressive osseous lesion. IMPRESSION: 1. No clear acute findings. 2. Chronic bronchitic markings. 3. Peripheral pleural thickening and parenchymal reticulation in the LEFT lower lobe similar to comparison PET-CT. Electronically Signed   By: Stewart  Edmunds M.D.   On: 10/13/2016 16:43   Ct Angio Chest Pe W And/or Wo Contrast  Result Date: 10/13/2016 CLINICAL DATA:  Dyspnea. EXAM: CT ANGIOGRAPHY CHEST WITH CONTRAST TECHNIQUE: Multidetector CT imaging of the chest was performed using the standard protocol during bolus administration of intravenous contrast. Multiplanar CT image reconstructions and MIPs were obtained to evaluate the vascular anatomy. CONTRAST:  75 mL of Isovue 370 intravenously. COMPARISON:  Radiographs of same day.   CT scan of April 28, 2015. FINDINGS: Cardiovascular: Satisfactory opacification of the pulmonary arteries to the segmental level. No evidence of pulmonary embolism. Normal heart   size. No pericardial effusion. Atherosclerosis of thoracic aorta is noted without aneurysm formation. Mediastinum/Nodes: No enlarged mediastinal, hilar, or axillary lymph nodes. Thyroid gland, trachea, and esophagus demonstrate no significant findings. Lungs/Pleura: No pneumothorax or pleural effusion is noted. Mild emphysematous disease is noted in the upper lobes bilaterally. Stable subpleural nodule seen in right upper lobe which can be considered benign at this point. 4 mm nodule seen in right upper lobe best seen on image number 29 of series 5. Upper Abdomen: No acute abnormality. Musculoskeletal: No chest wall abnormality. No acute or significant osseous findings. Review of the MIP images confirms the above findings. IMPRESSION: No definite evidence of pulmonary embolus. 4 mm nodule seen in right upper lobe. No follow-up needed if patient is low-risk. Non-contrast chest CT can be considered in 12 months if patient is high-risk. This recommendation follows the consensus statement: Guidelines for Management of Incidental Pulmonary Nodules Detected on CT Images: From the Fleischner Society 2017; Radiology 2017; 284:228-243. Aortic Atherosclerosis (ICD10-I70.0). Electronically Signed   By: Marijo Conception, M.D.   On: 10/13/2016 21:13    Procedures Procedures (including critical care time)  Medications Ordered in ED Medications  ondansetron (ZOFRAN) injection 4 mg (not administered)  fentaNYL (SUBLIMAZE) injection 50 mcg (not administered)  sodium chloride 0.9 % bolus 500 mL (0 mLs Intravenous Stopped 10/13/16 2030)  fentaNYL (SUBLIMAZE) injection 50 mcg (50 mcg Intravenous Given 10/13/16 1929)  iopamidol (ISOVUE-370) 76 % injection 75 mL (75 mLs Intravenous Contrast Given 10/13/16 2044)  fentaNYL (SUBLIMAZE) injection 50 mcg (50  mcg Intravenous Given 10/13/16 2114)     Initial Impression / Assessment and Plan / ED Course  I have reviewed the triage vital signs and the nursing notes.  Pertinent labs & imaging results that were available during my care of the patient were reviewed by me and considered in my medical decision making (see chart for details).     CT chest shows no definitive evidence of a pulmonary embolism.  There is a 4 mm nodule in the right upper lobe. This was discussed with the patient. Second troponin pending. Discussed with Dr. Tomi Bamberger.  Final Clinical Impressions(s) / ED Diagnoses   Final diagnoses:  None    New Prescriptions New Prescriptions   No medications on file     Nat Christen, MD 10/14/16 0005

## 2016-10-14 NOTE — Discharge Instructions (Signed)
There was a very small nodule in your right upper lung. This will need to be rechecked in approximately 1 year. Otherwise screening tests showed no acute abnormality. Continue oxygen. Medicine for pain and nausea.

## 2016-11-01 ENCOUNTER — Ambulatory Visit (INDEPENDENT_AMBULATORY_CARE_PROVIDER_SITE_OTHER): Payer: PPO | Admitting: Urology

## 2016-11-01 DIAGNOSIS — J441 Chronic obstructive pulmonary disease with (acute) exacerbation: Secondary | ICD-10-CM | POA: Diagnosis not present

## 2016-11-01 DIAGNOSIS — D49511 Neoplasm of unspecified behavior of right kidney: Secondary | ICD-10-CM | POA: Diagnosis not present

## 2016-11-01 DIAGNOSIS — N2889 Other specified disorders of kidney and ureter: Secondary | ICD-10-CM | POA: Diagnosis not present

## 2016-11-01 DIAGNOSIS — R0902 Hypoxemia: Secondary | ICD-10-CM | POA: Diagnosis not present

## 2016-11-01 DIAGNOSIS — Z6823 Body mass index (BMI) 23.0-23.9, adult: Secondary | ICD-10-CM | POA: Diagnosis not present

## 2016-11-01 DIAGNOSIS — R109 Unspecified abdominal pain: Secondary | ICD-10-CM | POA: Diagnosis not present

## 2016-11-10 DIAGNOSIS — R448 Other symptoms and signs involving general sensations and perceptions: Secondary | ICD-10-CM | POA: Diagnosis not present

## 2016-11-10 DIAGNOSIS — R0602 Shortness of breath: Secondary | ICD-10-CM | POA: Diagnosis not present

## 2016-11-29 DIAGNOSIS — R109 Unspecified abdominal pain: Secondary | ICD-10-CM | POA: Diagnosis not present

## 2016-11-29 DIAGNOSIS — J441 Chronic obstructive pulmonary disease with (acute) exacerbation: Secondary | ICD-10-CM | POA: Diagnosis not present

## 2016-11-29 DIAGNOSIS — F411 Generalized anxiety disorder: Secondary | ICD-10-CM | POA: Diagnosis not present

## 2016-11-29 DIAGNOSIS — G4700 Insomnia, unspecified: Secondary | ICD-10-CM | POA: Diagnosis not present

## 2016-11-29 DIAGNOSIS — N2889 Other specified disorders of kidney and ureter: Secondary | ICD-10-CM | POA: Diagnosis not present

## 2016-11-29 DIAGNOSIS — Z6823 Body mass index (BMI) 23.0-23.9, adult: Secondary | ICD-10-CM | POA: Diagnosis not present

## 2016-12-02 DIAGNOSIS — Z23 Encounter for immunization: Secondary | ICD-10-CM | POA: Diagnosis not present

## 2016-12-10 DIAGNOSIS — R0602 Shortness of breath: Secondary | ICD-10-CM | POA: Diagnosis not present

## 2016-12-10 DIAGNOSIS — R448 Other symptoms and signs involving general sensations and perceptions: Secondary | ICD-10-CM | POA: Diagnosis not present

## 2016-12-23 DIAGNOSIS — J449 Chronic obstructive pulmonary disease, unspecified: Secondary | ICD-10-CM | POA: Diagnosis not present

## 2016-12-29 DIAGNOSIS — R1032 Left lower quadrant pain: Secondary | ICD-10-CM | POA: Diagnosis not present

## 2016-12-29 DIAGNOSIS — R1012 Left upper quadrant pain: Secondary | ICD-10-CM | POA: Diagnosis not present

## 2016-12-29 DIAGNOSIS — Z9181 History of falling: Secondary | ICD-10-CM | POA: Diagnosis not present

## 2016-12-29 DIAGNOSIS — Z6825 Body mass index (BMI) 25.0-25.9, adult: Secondary | ICD-10-CM | POA: Diagnosis not present

## 2017-01-01 ENCOUNTER — Emergency Department (HOSPITAL_COMMUNITY): Payer: PPO

## 2017-01-01 ENCOUNTER — Other Ambulatory Visit: Payer: Self-pay

## 2017-01-01 ENCOUNTER — Observation Stay (HOSPITAL_COMMUNITY)
Admission: EM | Admit: 2017-01-01 | Discharge: 2017-01-03 | Disposition: A | Discharge status: Home / Self Care | Payer: PPO | Attending: Internal Medicine | Admitting: Internal Medicine

## 2017-01-01 ENCOUNTER — Encounter (HOSPITAL_COMMUNITY): Payer: Self-pay | Admitting: Emergency Medicine

## 2017-01-01 DIAGNOSIS — N2889 Other specified disorders of kidney and ureter: Secondary | ICD-10-CM | POA: Diagnosis present

## 2017-01-01 DIAGNOSIS — Y929 Unspecified place or not applicable: Secondary | ICD-10-CM | POA: Diagnosis not present

## 2017-01-01 DIAGNOSIS — I10 Essential (primary) hypertension: Secondary | ICD-10-CM | POA: Diagnosis present

## 2017-01-01 DIAGNOSIS — F17210 Nicotine dependence, cigarettes, uncomplicated: Secondary | ICD-10-CM | POA: Insufficient documentation

## 2017-01-01 DIAGNOSIS — E785 Hyperlipidemia, unspecified: Secondary | ICD-10-CM | POA: Insufficient documentation

## 2017-01-01 DIAGNOSIS — S27329A Contusion of lung, unspecified, initial encounter: Secondary | ICD-10-CM | POA: Diagnosis present

## 2017-01-01 DIAGNOSIS — Y998 Other external cause status: Secondary | ICD-10-CM | POA: Insufficient documentation

## 2017-01-01 DIAGNOSIS — J9611 Chronic respiratory failure with hypoxia: Secondary | ICD-10-CM | POA: Insufficient documentation

## 2017-01-01 DIAGNOSIS — E119 Type 2 diabetes mellitus without complications: Secondary | ICD-10-CM | POA: Insufficient documentation

## 2017-01-01 DIAGNOSIS — R079 Chest pain, unspecified: Secondary | ICD-10-CM | POA: Diagnosis not present

## 2017-01-01 DIAGNOSIS — Z794 Long term (current) use of insulin: Secondary | ICD-10-CM

## 2017-01-01 DIAGNOSIS — Y939 Activity, unspecified: Secondary | ICD-10-CM | POA: Insufficient documentation

## 2017-01-01 DIAGNOSIS — I110 Hypertensive heart disease with heart failure: Secondary | ICD-10-CM | POA: Diagnosis not present

## 2017-01-01 DIAGNOSIS — R911 Solitary pulmonary nodule: Secondary | ICD-10-CM | POA: Insufficient documentation

## 2017-01-01 DIAGNOSIS — S2242XA Multiple fractures of ribs, left side, initial encounter for closed fracture: Secondary | ICD-10-CM | POA: Diagnosis not present

## 2017-01-01 DIAGNOSIS — I509 Heart failure, unspecified: Secondary | ICD-10-CM | POA: Insufficient documentation

## 2017-01-01 DIAGNOSIS — Y92009 Unspecified place in unspecified non-institutional (private) residence as the place of occurrence of the external cause: Secondary | ICD-10-CM | POA: Diagnosis not present

## 2017-01-01 DIAGNOSIS — IMO0001 Reserved for inherently not codable concepts without codable children: Secondary | ICD-10-CM

## 2017-01-01 DIAGNOSIS — R918 Other nonspecific abnormal finding of lung field: Secondary | ICD-10-CM | POA: Diagnosis present

## 2017-01-01 DIAGNOSIS — S299XXA Unspecified injury of thorax, initial encounter: Secondary | ICD-10-CM | POA: Diagnosis not present

## 2017-01-01 DIAGNOSIS — J449 Chronic obstructive pulmonary disease, unspecified: Secondary | ICD-10-CM | POA: Insufficient documentation

## 2017-01-01 DIAGNOSIS — R0682 Tachypnea, not elsewhere classified: Secondary | ICD-10-CM | POA: Diagnosis not present

## 2017-01-01 DIAGNOSIS — D4101 Neoplasm of uncertain behavior of right kidney: Secondary | ICD-10-CM | POA: Insufficient documentation

## 2017-01-01 DIAGNOSIS — W19XXXA Unspecified fall, initial encounter: Secondary | ICD-10-CM | POA: Diagnosis not present

## 2017-01-01 DIAGNOSIS — S27321A Contusion of lung, unilateral, initial encounter: Secondary | ICD-10-CM | POA: Diagnosis not present

## 2017-01-01 DIAGNOSIS — S3991XA Unspecified injury of abdomen, initial encounter: Secondary | ICD-10-CM | POA: Diagnosis not present

## 2017-01-01 DIAGNOSIS — R10814 Left lower quadrant abdominal tenderness: Secondary | ICD-10-CM | POA: Diagnosis not present

## 2017-01-01 DIAGNOSIS — S2249XA Multiple fractures of ribs, unspecified side, initial encounter for closed fracture: Secondary | ICD-10-CM | POA: Diagnosis present

## 2017-01-01 DIAGNOSIS — S20212A Contusion of left front wall of thorax, initial encounter: Secondary | ICD-10-CM | POA: Diagnosis not present

## 2017-01-01 LAB — COMPREHENSIVE METABOLIC PANEL
ALBUMIN: 4 g/dL (ref 3.5–5.0)
ALT: 10 U/L — ABNORMAL LOW (ref 14–54)
ANION GAP: 8 (ref 5–15)
AST: 15 U/L (ref 15–41)
Alkaline Phosphatase: 80 U/L (ref 38–126)
BUN: 16 mg/dL (ref 6–20)
CALCIUM: 9.8 mg/dL (ref 8.9–10.3)
CO2: 32 mmol/L (ref 22–32)
CREATININE: 0.74 mg/dL (ref 0.44–1.00)
Chloride: 101 mmol/L (ref 101–111)
GFR calc Af Amer: >60 mL/min (ref >60)
GFR calc non Af Amer: >60 mL/min (ref >60)
Glucose, Bld: 108 mg/dL — ABNORMAL HIGH (ref 65–99)
POTASSIUM: 4.1 mmol/L (ref 3.5–5.1)
SODIUM: 141 mmol/L (ref 135–145)
TOTAL PROTEIN: 7.1 g/dL (ref 6.5–8.1)
Total Bilirubin: 1.3 mg/dL — ABNORMAL HIGH (ref 0.3–1.2)

## 2017-01-01 LAB — CBC WITH DIFFERENTIAL/PLATELET
BASOS PCT: 0 %
Basophils Absolute: 0 10*3/uL (ref 0.0–0.1)
Eosinophils Absolute: 0.2 10*3/uL (ref 0.0–0.7)
Eosinophils Relative: 2 %
HCT: 40.2 % (ref 36.0–46.0)
Hemoglobin: 13.4 g/dL (ref 12.0–15.0)
LYMPHS ABS: 2.3 10*3/uL (ref 0.7–4.0)
LYMPHS PCT: 20 %
MCH: 31.2 pg (ref 26.0–34.0)
MCHC: 33.3 g/dL (ref 30.0–36.0)
MCV: 93.7 fL (ref 78.0–100.0)
Monocytes Absolute: 0.9 10*3/uL (ref 0.1–1.0)
Monocytes Relative: 8 %
NEUTROS PCT: 70 %
Neutro Abs: 7.8 10*3/uL — ABNORMAL HIGH (ref 1.7–7.7)
PLATELETS: 268 10*3/uL (ref 150–400)
RBC: 4.29 MIL/uL (ref 3.87–5.11)
RDW: 13.2 % (ref 11.5–15.5)
WBC: 11.2 10*3/uL — AB (ref 4.0–10.5)

## 2017-01-01 LAB — TROPONIN I

## 2017-01-01 LAB — GLUCOSE, CAPILLARY: Glucose-Capillary: 228 mg/dL — ABNORMAL HIGH (ref 65–99)

## 2017-01-01 MED ORDER — INSULIN ASPART 100 UNIT/ML ~~LOC~~ SOLN
0.0000 [IU] | Freq: Three times a day (TID) | SUBCUTANEOUS | Status: DC
  Administered 2017-01-02: 2 [IU] via SUBCUTANEOUS
  Administered 2017-01-02: 1 [IU] via SUBCUTANEOUS
  Administered 2017-01-03: 2 [IU] via SUBCUTANEOUS
  Administered 2017-01-03: 1 [IU] via SUBCUTANEOUS

## 2017-01-01 MED ORDER — OXYCODONE-ACETAMINOPHEN 5-325 MG PO TABS
1.0000 | ORAL_TABLET | Freq: Three times a day (TID) | ORAL | Status: DC | PRN
  Filled 2017-01-01: qty 1

## 2017-01-01 MED ORDER — CITALOPRAM HYDROBROMIDE 20 MG PO TABS
ORAL_TABLET | ORAL | Status: AC
  Filled 2017-01-01: qty 2

## 2017-01-01 MED ORDER — VITAMIN B-12 1000 MCG PO TABS
1000.0000 ug | ORAL_TABLET | Freq: Every day | ORAL | Status: DC
  Administered 2017-01-02 – 2017-01-03 (×2): 1000 ug via ORAL
  Filled 2017-01-01 (×2): qty 1

## 2017-01-01 MED ORDER — LOSARTAN POTASSIUM 50 MG PO TABS
50.0000 mg | ORAL_TABLET | Freq: Every day | ORAL | Status: DC
  Administered 2017-01-02 – 2017-01-03 (×2): 50 mg via ORAL
  Filled 2017-01-01 (×2): qty 1

## 2017-01-01 MED ORDER — IPRATROPIUM-ALBUTEROL 0.5-2.5 (3) MG/3ML IN SOLN: 3.0000 mL | Freq: Three times a day (TID) | RESPIRATORY_TRACT | Status: DC

## 2017-01-01 MED ORDER — SODIUM CHLORIDE 0.9 % IV BOLUS (SEPSIS)
1000.0000 mL | Freq: Once | INTRAVENOUS | Status: AC
  Administered 2017-01-01: 1000 mL via INTRAVENOUS

## 2017-01-01 MED ORDER — METOPROLOL TARTRATE 25 MG PO TABS
25.0000 mg | ORAL_TABLET | Freq: Two times a day (BID) | ORAL | Status: DC
Start: 2017-01-01 — End: 2017-01-03
  Administered 2017-01-01 – 2017-01-03 (×4): 25 mg via ORAL
  Filled 2017-01-01 (×4): qty 1

## 2017-01-01 MED ORDER — LORAZEPAM 0.5 MG PO TABS
0.5000 mg | ORAL_TABLET | Freq: Three times a day (TID) | ORAL | Status: DC
  Administered 2017-01-01 – 2017-01-03 (×7): 0.5 mg via ORAL
  Filled 2017-01-01 (×7): qty 1

## 2017-01-01 MED ORDER — GUAIFENESIN ER 600 MG PO TB12
600.0000 mg | ORAL_TABLET | Freq: Two times a day (BID) | ORAL | Status: DC
  Administered 2017-01-01 – 2017-01-03 (×4): 600 mg via ORAL
  Filled 2017-01-01 (×6): qty 1

## 2017-01-01 MED ORDER — SENNA 8.6 MG PO TABS
1.0000 | ORAL_TABLET | Freq: Two times a day (BID) | ORAL | Status: DC
  Administered 2017-01-02 – 2017-01-03 (×3): 8.6 mg via ORAL
  Filled 2017-01-01 (×4): qty 1

## 2017-01-01 MED ORDER — CITALOPRAM HYDROBROMIDE 20 MG PO TABS
40.0000 mg | ORAL_TABLET | Freq: Every evening | ORAL | Status: DC
Start: 2017-01-01 — End: 2017-01-03
  Administered 2017-01-01 – 2017-01-02 (×2): 40 mg via ORAL
  Filled 2017-01-01 (×3): qty 2
  Filled 2017-01-01: qty 1

## 2017-01-01 MED ORDER — ASPIRIN EC 81 MG PO TBEC
81.0000 mg | DELAYED_RELEASE_TABLET | Freq: Every morning | ORAL | Status: DC
Start: 2017-01-02 — End: 2017-01-03
  Administered 2017-01-02 – 2017-01-03 (×2): 81 mg via ORAL
  Filled 2017-01-01 (×2): qty 1

## 2017-01-01 MED ORDER — VITAMIN B-12 500 MCG PO TABS: 1000.0000 ug | ORAL_TABLET | Freq: Every day | ORAL | Status: DC

## 2017-01-01 MED ORDER — LEVOFLOXACIN 750 MG PO TABS
750.0000 mg | ORAL_TABLET | Freq: Once | ORAL | Status: AC
  Administered 2017-01-01: 750 mg via ORAL
  Filled 2017-01-01: qty 1

## 2017-01-01 MED ORDER — HYDROMORPHONE HCL 1 MG/ML IJ SOLN
0.5000 mg | INTRAMUSCULAR | Status: AC | PRN
  Administered 2017-01-01 – 2017-01-02 (×3): 0.5 mg via INTRAVENOUS
  Filled 2017-01-01 (×3): qty 1

## 2017-01-01 MED ORDER — IPRATROPIUM-ALBUTEROL 0.5-2.5 (3) MG/3ML IN SOLN
3.0000 mL | Freq: Once | RESPIRATORY_TRACT | Status: AC
  Administered 2017-01-01: 3 mL via RESPIRATORY_TRACT
  Filled 2017-01-01: qty 3

## 2017-01-01 MED ORDER — MOMETASONE FURO-FORMOTEROL FUM 200-5 MCG/ACT IN AERO
2.0000 | INHALATION_SPRAY | Freq: Two times a day (BID) | RESPIRATORY_TRACT | Status: DC
  Administered 2017-01-02 – 2017-01-03 (×3): 2 via RESPIRATORY_TRACT
  Filled 2017-01-01: qty 8.8

## 2017-01-01 MED ORDER — IOPAMIDOL (ISOVUE-300) INJECTION 61%
100.0000 mL | Freq: Once | INTRAVENOUS | Status: AC | PRN
  Administered 2017-01-01: 100 mL via INTRAVENOUS

## 2017-01-01 MED ORDER — HYDROMORPHONE HCL 1 MG/ML IJ SOLN
0.5000 mg | Freq: Once | INTRAMUSCULAR | Status: AC
  Administered 2017-01-01: 0.5 mg via INTRAVENOUS
  Filled 2017-01-01: qty 1

## 2017-01-01 MED ORDER — HYDROCODONE-ACETAMINOPHEN 7.5-325 MG PO TABS
1.0000 | ORAL_TABLET | Freq: Three times a day (TID) | ORAL | Status: DC | PRN
  Administered 2017-01-01: 1 via ORAL
  Filled 2017-01-01: qty 1

## 2017-01-01 MED ORDER — IPRATROPIUM-ALBUTEROL 0.5-2.5 (3) MG/3ML IN SOLN
3.0000 mL | Freq: Three times a day (TID) | RESPIRATORY_TRACT | Status: DC
  Administered 2017-01-01 – 2017-01-03 (×6): 3 mL via RESPIRATORY_TRACT
  Filled 2017-01-01 (×6): qty 3

## 2017-01-01 MED ORDER — FENTANYL CITRATE (PF) 100 MCG/2ML IJ SOLN
50.0000 ug | Freq: Once | INTRAMUSCULAR | Status: AC
  Administered 2017-01-01: 50 ug via INTRAVENOUS
  Filled 2017-01-01: qty 2

## 2017-01-01 NOTE — ED Triage Notes (Signed)
Lt side rib pain since fall x 3 weeks ago. Pt has also had cough and congestion,  Coughing up green mucous.  Pt on 3l o2 continuously

## 2017-01-01 NOTE — H&P (Signed)
History and Physical  Marie Holmes KGU:[id number] DOB: 01-15-1936 DOA: 01/01/2017  Referring physician: EDP PCP: Celene Squibb, MD   Chief Complaint: Left-sided rib pain  HPI: Marie Holmes is a 81 y.o. female  H/o hypertension, insulin-dependent type 2 diabetes, Copd on home o2, presented to the ED due to left sided pain,  report mechanical fall  at home a few weeks ago.  She otherwise denies fever, no increased oxygen requirement, no syncope, no lower extremity edema.   ED course: Vital signs are stable.  Basic lab work unremarkable.  Troponin negative.  EKG no acute changes. CT chest /ab/pel: Minimally displaced left eighth and ninth lateral rib fractures with underlying mild pulmonary contusion.  She has significant left-sided pain required IV Dilaudid.  Hospitalist called to further manage the patient due to poor pain control.    Review of Systems:  Detail per HPI, Review of systems are otherwise negative  Past Medical History:  Diagnosis Date  . Adenomatous colon polyp   . Anxiety   . Benign tumor of breast 2004   left  . Broken ribs    history of several broken ribs on right and compression  fx  . C. difficile colitis 06/21/2014  . Cancer ([place])    non-hodgkins b cell lymphoma  . CHF (congestive heart failure) ([place])    hosp. for CHF- 10/2013  . Chronic abdominal pain   . Cold virus    seen by Dr. Deborra Medina on 10/12, given Rx for antibiotic & steroid  . COPD (chronic obstructive pulmonary disease) ([place])   . Depression   . Diabetes mellitus without complication ([place])   . Diverticulosis   . Fibroma of skin    Multiple fibromas/lipomas on arms and legs B  . Gait instability   . GERD (gastroesophageal reflux disease)   . History of CT scan of abdomen 05/23/2003   Mild fatty liver inf, tiny hypodensity in right lobe of liver  . Hx of radiation therapy 04/11/12- 05/08/12   left chest wall area, 40 gray 20 fx  . Hyperlipidemia   . Hypertension   . Kidney stone 02/21/1981   Right/removed  . 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 Osteoarthritis    hands, degenerative back , osteoporosis   . Palpitations   . Pulmonary nodules   . Pyelonephritis   . Shortness of breath    only with movemnent   . Tremor   . UTI (lower urinary tract infection)   . Weight loss    "multifactorial" per PMD note   Past Surgical History:  Procedure Laterality Date  . ABDOMINAL HYSTERECTOMY  02/21/1970   right ovary remaining  . APPENDECTOMY  02/21/1961  . BONE MARROW BIOPSY  03/09/2012  . BREAST SURGERY  1970's   lump removed- benign   . EXPLORATORY LAPAROTOMY  02/21/1970   for ? adhesions  . EYE SURGERY     bilateral cataracts removed - /W- IOL  . lung core biopsy  02/06/2012  . TONSILLECTOMY    . TUBAL LIGATION     Social History:  reports that she has been smoking cigarettes.  She has a 7.50 pack-year smoking history. she has never used smokeless tobacco. She reports that she does not drink alcohol or use drugs. Patient lives at *& is able to participate in activities of daily living independently *  Allergies  Allergen Reactions  . Sulfonamide Derivatives Anaphylaxis  . Metformin And Related Diarrhea  Family History  Problem Relation Age of Onset  . Parkinsonism Brother   . Kidney disease Mother   . Stroke Father   . Clotting disorder Father   . Cancer Sister   . Heart disease Brother       Prior to Admission medications   Medication Sig Start Date End Date Taking? Authorizing Provider  albuterol (PROVENTIL) (2.5 MG/3ML) 0.083% nebulizer solution Take 2.5 mg 3 (three) times daily by nebulization. 3 treatmens daily 10/03/16  Yes [provider]  aspirin EC 81 MG tablet Take 81 mg by mouth every morning.   Yes [provider]  budesonide-formoterol (SYMBICORT) 160-4.5 MCG/ACT inhaler Inhale 2 puffs into the lungs 2 (two) times daily. Patient taking differently: Inhale 2 puffs into the  lungs daily as needed (for shortness of breath).  12/25/14  Yes Isaac Bliss, Rayford Halsted, MD  citalopram (CELEXA) 40 MG tablet Take 1 tablet (40 mg total) by mouth daily. Patient taking differently: Take 40 mg by mouth every evening.  03/05/15  Yes Lucille Passy, MD  HYDROcodone-acetaminophen (NORCO) 7.5-325 MG tablet Take 1 tablet 3 (three) times daily as needed by mouth for moderate pain or severe pain.  08/01/16  Yes [provider]  LORazepam (ATIVAN) 0.5 MG tablet Take 0.5 mg 3 (three) times daily by mouth. 11/29/16  Yes [provider]  losartan (COZAAR) 50 MG tablet TAKE ONE TABLET BY MOUTH ONCE DAILY IN THE MORNING 07/21/15  Yes Lucille Passy, MD  metoprolol tartrate (LOPRESSOR) 25 MG tablet Take 25 mg 2 (two) times daily by mouth. 12/28/16  Yes [provider]  OXYGEN Inhale 2 L into the lungs continuous.   Yes [provider]  TRESIBA FLEXTOUCH 200 UNIT/ML SOPN Inject 16 Units every evening into the skin.  06/08/16  Yes [provider]  vitamin B-12 (CYANOCOBALAMIN) 500 MCG tablet Take 1,000 mcg by mouth daily.    Yes [provider]  ondansetron (ZOFRAN) 4 MG tablet Take 1 tablet (4 mg total) by mouth every 6 (six) hours. Patient not taking: Reported on 01/01/2017 10/14/16   Nat Christen, MD  oxyCODONE-acetaminophen (PERCOCET/ROXICET) 5-325 MG tablet Take 1 tablet by mouth every 4 (four) hours as needed for severe pain. Patient not taking: Reported on 01/01/2017 10/14/16   Nat Christen, MD    Physical Exam: BP (!) 136/57 (BP Location: Right Arm)   Pulse 82   Temp 98.8 F (37.1 C) (Oral)   Resp 18   Ht _0  (1.549 m)   Wt 56.7 kg (125 lb)   SpO2 96%   BMI 23.62 kg/m   General:  * Eyes: PERRL ENT: unremarkable Neck: supple, no JVD Cardiovascular: RRR Respiratory: CTABL Abdomen: soft/ND/ND, positive bowel sounds Skin: no rash Musculoskeletal:  No edema Psychiatric: calm/cooperative Neurologic: no focal findings  *           Labs on Admission:  Basic Metabolic Panel: Recent Labs  Lab 01/01/17 1155  NA 141  K 4.1  CL 101  CO2 32  GLUCOSE 108*  BUN 16  CREATININE 0.74  CALCIUM 9.8   Liver Function Tests: Recent Labs  Lab 01/01/17 1155  AST 15  ALT 10*  ALKPHOS 80  BILITOT 1.3*  PROT 7.1  ALBUMIN 4.0   No results for input(s): LIPASE, AMYLASE in the last 168 hours. No results for input(s): AMMONIA in the last 168 hours. CBC: Recent Labs  Lab 01/01/17 1155  WBC 11.2*  NEUTROABS 7.8*  HGB 13.4  HCT  40.2  MCV 93.7  PLT 268   Cardiac Enzymes: Recent Labs  Lab 01/01/17 1155  TROPONINI <0.03    BNP (last 3 results) Recent Labs    07/08/16 1536  BNP 49.0    ProBNP (last 3 results) No results for input(s): PROBNP in the last 8760 hours.  CBG: Recent Labs  Lab 01/01/17 2104  GLUCAP 228*    Radiological Exams on Admission: Dg Chest 2 View  Result Date: 01/01/2017 CLINICAL DATA:  Evaluate for pneumonia, fall 2 weeks ago in bedroom, pain in left anterior mid to lower ribs, hx of non hodgkins lymphoma, pt has 2 new left lung nodules and adrenal gland tumor found, weakness EXAM: CHEST  2 VIEW COMPARISON:  Chest CT 10/13/2016 FINDINGS: Normal mediastinum and cardiac silhouette. Normal pulmonary vasculature. No evidence of effusion, infiltrate, or pneumothorax. No acute bony abnormality. IMPRESSION: No acute cardiopulmonary process. Electronically Signed   By: Suzy Bouchard M.D.   On: 01/01/2017 12:38   Ct Chest W Contrast  Result Date: 01/01/2017 CLINICAL DATA:  Left-sided rib pain since fall 3 weeks ago. Cough and congestion. EXAM: CT CHEST, ABDOMEN, AND PELVIS WITH CONTRAST TECHNIQUE: Multidetector CT imaging of the chest, abdomen and pelvis was performed following the standard protocol during bolus administration of intravenous contrast. CONTRAST:  132m ISOVUE-300 IOPAMIDOL (ISOVUE-300) INJECTION 61% COMPARISON:  Chest radiograph 01/01/2017, body CT 10/13/2016 FINDINGS: CT  CHEST FINDINGS Cardiovascular: Normal heart size. No pericardial effusion. Heavy calcific atherosclerotic disease of the coronary arteries and aorta. No central pulmonary embolus. Mediastinum/Nodes: No enlarged mediastinal, hilar, or axillary lymph nodes. Thyroid gland, trachea, and esophagus demonstrate no significant findings. Lungs/Pleura: Upper lobe predominant moderate to severe emphysema. Stable 4.5 mm ground-glass right upper lobe pulmonary nodule, image 50/139. Second tiny 3 mm subpleural nodule more superiorly in the right upper lobe, image 41/139, sequence 3. Nodular subpleural scarring in the right apex posteriorly. No lobar consolidation. Musculoskeletal: Minimally displaced left lateral ninth and eighth rib fractures, with underlying mild pulmonary contusion. CT ABDOMEN PELVIS FINDINGS Hepatobiliary: No focal liver abnormality is seen. No gallstones, gallbladder wall thickening, or biliary dilatation. Pancreas: Unremarkable. No pancreatic ductal dilatation or surrounding inflammatory changes. Spleen: Normal in size without focal abnormality. Adrenals/Urinary Tract: Adrenal glands are unremarkable. Kidneys are without renal calculi, focal lesion, or hydronephrosis. Bilateral renal cysts. Bladder is unremarkable. Stomach/Bowel: Stomach is within normal limits. No evidence of appendicitis. No evidence of bowel wall thickening, distention, or inflammatory changes. Vascular/Lymphatic: Aortic atherosclerosis. No enlarged abdominal or pelvic lymph nodes. Reproductive: Status post hysterectomy. No adnexal masses. Other: No abdominal wall hernia or abnormality. No abdominopelvic ascites. Musculoskeletal: No suspicious osseous lesions. IMPRESSION: Minimally displaced left eighth and ninth lateral rib fractures with underlying mild pulmonary contusion. Two right pulmonary nodules, the larger measuring 4.5 mm and stable. No follow-up needed if patient is low-risk. Non-contrast chest CT can be considered in 12  months if patient is high-risk. This recommendation follows the consensus statement: Guidelines for Management of Incidental Pulmonary Nodules Detected on CT Images: From the Fleischner Society 2017; Radiology 2017; 284:228-243. Moderate to advanced upper lobe predominant emphysema. Calcific atherosclerotic disease of the aorta and coronary arteries. Electronically Signed   By: DFidela SalisburyM.D.   On: 01/01/2017 16:34   Ct Abdomen Pelvis W Contrast  Result Date: 01/01/2017 CLINICAL DATA:  Left-sided rib pain since fall 3 weeks ago. Cough and congestion. EXAM: CT CHEST, ABDOMEN, AND PELVIS WITH CONTRAST TECHNIQUE: Multidetector CT imaging of the chest, abdomen and pelvis was performed following  the standard protocol during bolus administration of intravenous contrast. CONTRAST:  19m ISOVUE-300 IOPAMIDOL (ISOVUE-300) INJECTION 61% COMPARISON:  Chest radiograph 01/01/2017, body CT 10/13/2016 FINDINGS: CT CHEST FINDINGS Cardiovascular: Normal heart size. No pericardial effusion. Heavy calcific atherosclerotic disease of the coronary arteries and aorta. No central pulmonary embolus. Mediastinum/Nodes: No enlarged mediastinal, hilar, or axillary lymph nodes. Thyroid gland, trachea, and esophagus demonstrate no significant findings. Lungs/Pleura: Upper lobe predominant moderate to severe emphysema. Stable 4.5 mm ground-glass right upper lobe pulmonary nodule, image 50/139. Second tiny 3 mm subpleural nodule more superiorly in the right upper lobe, image 41/139, sequence 3. Nodular subpleural scarring in the right apex posteriorly. No lobar consolidation. Musculoskeletal: Minimally displaced left lateral ninth and eighth rib fractures, with underlying mild pulmonary contusion. CT ABDOMEN PELVIS FINDINGS Hepatobiliary: No focal liver abnormality is seen. No gallstones, gallbladder wall thickening, or biliary dilatation. Pancreas: Unremarkable. No pancreatic ductal dilatation or surrounding inflammatory changes.  Spleen: Normal in size without focal abnormality. Adrenals/Urinary Tract: Adrenal glands are unremarkable. Kidneys are without renal calculi, focal lesion, or hydronephrosis. Bilateral renal cysts. Bladder is unremarkable. Stomach/Bowel: Stomach is within normal limits. No evidence of appendicitis. No evidence of bowel wall thickening, distention, or inflammatory changes. Vascular/Lymphatic: Aortic atherosclerosis. No enlarged abdominal or pelvic lymph nodes. Reproductive: Status post hysterectomy. No adnexal masses. Other: No abdominal wall hernia or abnormality. No abdominopelvic ascites. Musculoskeletal: No suspicious osseous lesions. IMPRESSION: Minimally displaced left eighth and ninth lateral rib fractures with underlying mild pulmonary contusion. Two right pulmonary nodules, the larger measuring 4.5 mm and stable. No follow-up needed if patient is low-risk. Non-contrast chest CT can be considered in 12 months if patient is high-risk. This recommendation follows the consensus statement: Guidelines for Management of Incidental Pulmonary Nodules Detected on CT Images: From the Fleischner Society 2017; Radiology 2017; 284:228-243. Moderate to advanced upper lobe predominant emphysema. Calcific atherosclerotic disease of the aorta and coronary arteries. Electronically Signed   By: DFidela SalisburyM.D.   On: 01/01/2017 16:34    EKG: Independently reviewed. *  Assessment/Plan Present on Admission: . Fall   Minimally displaced left eighth and ninth lateral rib fractures with underlying mild pulmonary contusion. S/p mechanical fall a few weeks ago She report hydrocodone does not help, iv dilaudid helped , will continue prn iv dilaudid, start percocet prn. Pain control, incentive spirometer  COPD /chronic hypoxic respiratory failure on home  o2 Stable, no wheezing, no cough,  Continue home meds  add duoneb, she is not able to take deep breath due to rib fractures and mild pulmonary contrusion ,    Insulin dependent dm2 ssi  HTN Continue home meds Lopressor/cozaar   Right renal mass. right flank pain Per last discharge summary: Patient has a history of pulmonary lymphoid neoplasm questionable for non-Hodgkin's lymphoma involving the left lower lobe of the lung. She is status post definitive radiotherapy. CT ab done in 06/2016 due to right flank pain was concerning of right renal mass,  -She is followed by a nephrologist for this per patient 's report. -CT ab /pel today , however report adrenal/urinary unremarkable -Patient reports has been having right sided chronic pain which she is taking hydrocodone for. -Continue pain control, continue outpatient follow up.    DVT prophylaxis: scd's  Consultants: none  Code Status: DNR, confirmed by EDP  Family Communication:  Patient   Disposition Plan: med surg obs  Time spent: 728ms  Rishit Burkhalter MD, PhD [facility] Pager [phone] 7PM-7AM, please contact night-coverage at [email or web address], password [email or web address]

## 2017-01-01 NOTE — ED Provider Notes (Signed)
Emergency Department Provider Note   I have reviewed the triage vital signs and the nursing notes.   HISTORY  Chief Complaint Rib Injury   HPI Marie Holmes is a 81 y.o. female with multiple comorbidities as documented below the presents to the emergency department today with left-sided rib pain patient states that she fell a few weeks ago and had rib pain since that time has had worsening cough that is become productive.  Saw her primary doctor who wanted her to come here for evaluation however patient refused.  The cough got worse and the pain has not improved so she decided today that it was time to come be evaluated.  She has had some chills but no measured fevers.  She is not had any recent illnesses otherwise.  No pain elsewhere from the fall.  Past Medical History:  Diagnosis Date  . Adenomatous colon polyp   . Anxiety   . Benign tumor of breast 2004   left  . Broken ribs    history of several broken ribs on right and compression  fx  . C. difficile colitis 06/21/2014  . Cancer ([place])    non-hodgkins b cell lymphoma  . CHF (congestive heart failure) ([place])    hosp. for CHF- 10/2013  . Chronic abdominal pain   . Cold virus    seen by Dr. Deborra Medina on 10/12, given Rx for antibiotic & steroid  . COPD (chronic obstructive pulmonary disease) ([place])   . Depression   . Diabetes mellitus without complication ([place])   . Diverticulosis   . Fibroma of skin    Multiple fibromas/lipomas on arms and legs B  . Gait instability   . GERD (gastroesophageal reflux disease)   . History of CT scan of abdomen 05/23/2003   Mild fatty liver inf, tiny hypodensity in right lobe of liver  . Hx of radiation therapy 04/11/12- 05/08/12   left chest wall area, 40 gray 20 fx  . Hyperlipidemia   . Hypertension   . Kidney stone 02/21/1981   Right/removed  . Lung cancer ([place])    pulmonary lymphoid neoplasm questionable for non-Hodgkins lymphoma  . Osteoarthritis    hands, degenerative back ,  osteoporosis   . Palpitations   . Pulmonary nodules   . Pyelonephritis   . Shortness of breath    only with movemnent   . Tremor   . UTI (lower urinary tract infection)   . Weight loss    "multifactorial" per PMD note    Patient Active Problem List   Diagnosis Date Noted  . COPD with acute exacerbation ([place]) 07/08/2016  . Renal mass, right 07/08/2016  . DNR (do not resuscitate) discussion   . Palliative care encounter   . Goals of care, counseling/discussion   . Dyspnea 07/19/2015  . FTT (failure to thrive) in adult 07/19/2015  . Depression with anxiety 07/19/2015  . Acute exacerbation of chronic obstructive pulmonary disease (COPD) ([place]) 07/19/2015  . Angiolipoma 05/04/2015  . Medicare annual wellness visit, subsequent 03/05/2015  . Leg mass 03/05/2015  . Diabetes mellitus, insulin dependent (IDDM), controlled ([place]) 12/23/2014  . Palpitations 09/15/2014  . Loss of weight 07/22/2014  . Tremor 07/22/2014  . Hematuria 07/01/2014  . Chronic anxiety 06/21/2014  . Skin lesions 02/27/2014  . Tobacco abuse 12/26/2013  . Chronic cholecystitis with calculus 12/11/2013  . Nausea alone 11/08/2013  . Gall bladder pain 11/08/2013  . Bowel incontinence 08/12/2013  . Dysuria 08/12/2013  . Lymphadenitis 06/19/2013  . Back  pain 04/11/2013  . Urinary frequency 02/27/2013  . Non Hodgkin's lymphoma ([place]) 11/28/2012  . COLONIC POLYPS 09/24/2008  . HLD (hyperlipidemia) 05/11/2006  . Essential hypertension 05/11/2006  . Venous (peripheral) insufficiency 05/11/2006  . COPD 05/11/2006  . GASTROESOPHAGEAL REFLUX, NO ESOPHAGITIS 04/20/2006  . IRRITABLE BOWEL SYNDROME 04/20/2006  . OSTEOARTHRITIS, MULTI SITES 04/20/2006  . Osteoporosis, unspecified 04/20/2006  . INSOMNIA NOS 04/20/2006  . INCONTINENCE, URGE 04/20/2006    Past Surgical History:  Procedure Laterality Date  . ABDOMINAL HYSTERECTOMY  02/21/1970   right ovary remaining  . APPENDECTOMY  02/21/1961  . BONE MARROW BIOPSY   03/09/2012  . BREAST SURGERY  1970's   lump removed- benign   . EXPLORATORY LAPAROTOMY  02/21/1970   for ? adhesions  . EYE SURGERY     bilateral cataracts removed - /W- IOL  . lung core biopsy  02/06/2012  . TONSILLECTOMY    . TUBAL LIGATION      Current Outpatient Rx  . Order #: [id number] Class: Historical Med  . Order #: [id number] Class: Historical Med  . Order #: [id number] Class: Historical Med  . Order #: [id number] Class: Normal  . Order #: [id number] Class: Normal  . Order #: [id number] Class: Historical Med  . Order #: [id number] Class: Historical Med  . Order #: [id number] Class: Normal  . Order #: [id number] Class: Historical Med  . Order #: [id number] Class: Print  . Order #: [id number] Class: Print  . Order #: [id number] Class: Historical Med  . Order #: [id number] Class: Historical Med  . Order #: [id number] Class: Historical Med    Allergies Sulfonamide derivatives and Metformin and related  Family History  Problem Relation Age of Onset  . Parkinsonism Brother   . Kidney disease Mother   . Stroke Father   . Clotting disorder Father   . Cancer Sister   . Heart disease Brother     Social History Social History   Tobacco Use  . Smoking status: Current Every Day Smoker    Packs/day: 0.25    Years: 30.00    Pack years: 7.50    Types: Cigarettes  . Smokeless tobacco: Never Used  . Tobacco comment: trying to quit smokes about 2-3 cigs per day, e-cigs with no nicotine  Substance Use Topics  . Alcohol use: No    Alcohol/week: 0.0 oz  . Drug use: No    Review of Systems  All other systems negative except as documented in the HPI. All pertinent positives and negatives as reviewed in the HPI. ____________________________________________   PHYSICAL EXAM:  VITAL SIGNS: ED Triage Vitals  Enc Vitals Group     BP 01/01/17 1109 (!) 164/68     Pulse Rate 01/01/17 1109 70     Resp 01/01/17 1109 18     Temp 01/01/17 1109 98.7 F (37.1 C)     Temp Source 01/01/17 1109 Oral      SpO2 01/01/17 1109 100 %     Weight 01/01/17 1100 125 lb (56.7 kg)     Height 01/01/17 1100 5' 1"  (1.549 m)    Constitutional: Alert and oriented. Well appearing and in no acute distress. Eyes: Conjunctivae are normal. PERRL. EOMI. Head: Atraumatic. Nose: No congestion/rhinnorhea. Mouth/Throat: Mucous membranes are moist.  Oropharynx non-erythematous. Neck: No stridor.  No meningeal signs.   Cardiovascular: Normal rate, regular rhythm. Good peripheral circulation. Grossly normal heart sounds.   Respiratory: Normal respiratory effort.  No retractions. Lungs CTAB. Gastrointestinal: Soft and nontender. No distention.  Musculoskeletal: No lower extremity tenderness nor edema. No gross deformities of extremities.  Neurologic:  Normal speech and language. No gross focal neurologic deficits are appreciated.  Skin:  Skin is warm, dry and intact. No rash noted.   ____________________________________________   LABS (all labs ordered are listed, but only abnormal results are displayed)  Labs Reviewed  CBC WITH DIFFERENTIAL/PLATELET - Abnormal; Notable for the following components:      Result Value   WBC 11.2 (*)    Neutro Abs 7.8 (*)    All other components within normal limits  COMPREHENSIVE METABOLIC PANEL - Abnormal; Notable for the following components:   Glucose, Bld 108 (*)    ALT 10 (*)    Total Bilirubin 1.3 (*)    All other components within normal limits  TROPONIN I   ____________________________________________  EKG   EKG Interpretation  Date/Time:  Sunday January 01 2017 12:28:48 EST Ventricular Rate:  75 PR Interval:    QRS Duration: 72 QT Interval:  429 QTC Calculation: 480 R Axis:   75 Text Interpretation:  Sinus rhythm Probable anteroseptal infarct, old Confirmed by Merrily Pew [phone]) on 01/01/2017 2:33:04 PM       ____________________________________________  RADIOLOGY  Dg Chest 2 View  Result Date: 01/01/2017 CLINICAL DATA:  Evaluate for  pneumonia, fall 2 weeks ago in bedroom, pain in left anterior mid to lower ribs, hx of non hodgkins lymphoma, pt has 2 new left lung nodules and adrenal gland tumor found, weakness EXAM: CHEST  2 VIEW COMPARISON:  Chest CT 10/13/2016 FINDINGS: Normal mediastinum and cardiac silhouette. Normal pulmonary vasculature. No evidence of effusion, infiltrate, or pneumothorax. No acute bony abnormality. IMPRESSION: No acute cardiopulmonary process. Electronically Signed   By: Suzy Bouchard M.D.   On: 01/01/2017 12:38    ____________________________________________   PROCEDURES  Procedure(s) performed:   Procedures   ____________________________________________   INITIAL IMPRESSION / ASSESSMENT AND PLAN / ED COURSE  Pertinent labs & imaging results that were available during my care of the patient were reviewed by me and considered in my medical decision making (see chart for details).  cxr clear. Question occult rib fracture vs intraabdominal causes for symptoms, will proceed with CT. If negative, feel patient can be discharged with pain medications and antibiotics and PCP follow up.   ____________________________________________  FINAL CLINICAL IMPRESSION(S) / ED DIAGNOSES  Final diagnoses:  None     MEDICATIONS GIVEN DURING THIS VISIT:  Medications  iopamidol (ISOVUE-300) 61 % injection 100 mL (not administered)  fentaNYL (SUBLIMAZE) injection 50 mcg (50 mcg Intravenous Given 01/01/17 1200)  sodium chloride 0.9 % bolus 1,000 mL (0 mLs Intravenous Stopped 01/01/17 1325)  ipratropium-albuterol (DUONEB) 0.5-2.5 (3) MG/3ML nebulizer solution 3 mL (3 mLs Nebulization Given 01/01/17 1438)  levofloxacin (LEVAQUIN) tablet 750 mg (750 mg Oral Given 01/01/17 1406)  HYDROmorphone (DILAUDID) injection 0.5 mg (0.5 mg Intravenous Given 01/01/17 1406)     NEW OUTPATIENT MEDICATIONS STARTED DURING THIS VISIT:  This SmartLink is deprecated. Use AVSMEDLIST instead to display the medication  list for a patient.  Note:  This document was prepared using Dragon voice recognition software and may include unintentional dictation errors.   Merrily Pew, MD 01/01/17 [phone]

## 2017-01-01 NOTE — ED Provider Notes (Signed)
Pt received at sign out with CT C/A/P pending. Pt c/o left ribs pain after fall a few weeks ago, as well as worsening cough with "green" sputum. Has been associated with chills and decreased movement/ambulation due to pain. Pt has been wearing her home O2 and taking her own pain meds without improvement. Denies objective fevers, no abd pain, no N/V/D. CT as below. Pt unable to ambulate due to generalized weakness/fatigue and pain. Has already received dose of levaquin as well as multiple doses of IV pain meds. Pt and family concerned regarding discharge. Dx and testing d/w pt and family.  Questions answered.  Verb understanding, agreeable to admit. 1754:  T/C to [facility] Dr. Erlinda Hong, case discussed, including:  HPI, pertinent PM/SHx, VS/PE, dx testing, ED course and treatment:  Agreeable to admit.      Results for orders placed or performed during the hospital encounter of 01/01/17  CBC with Differential  Result Value Ref Range   WBC 11.2 (H) 4.0 - 10.5 K/uL   RBC 4.29 3.87 - 5.11 MIL/uL   Hemoglobin 13.4 12.0 - 15.0 g/dL   HCT 40.2 36.0 - 46.0 %   MCV 93.7 78.0 - 100.0 fL   MCH 31.2 26.0 - 34.0 pg   MCHC 33.3 30.0 - 36.0 g/dL   RDW 13.2 11.5 - 15.5 %   Platelets 268 150 - 400 K/uL   Neutrophils Relative % 70 %   Neutro Abs 7.8 (H) 1.7 - 7.7 K/uL   Lymphocytes Relative 20 %   Lymphs Abs 2.3 0.7 - 4.0 K/uL   Monocytes Relative 8 %   Monocytes Absolute 0.9 0.1 - 1.0 K/uL   Eosinophils Relative 2 %   Eosinophils Absolute 0.2 0.0 - 0.7 K/uL   Basophils Relative 0 %   Basophils Absolute 0.0 0.0 - 0.1 K/uL  Comprehensive metabolic panel  Result Value Ref Range   Sodium 141 135 - 145 mmol/L   Potassium 4.1 3.5 - 5.1 mmol/L   Chloride 101 101 - 111 mmol/L   CO2 32 22 - 32 mmol/L   Glucose, Bld 108 (H) 65 - 99 mg/dL   BUN 16 6 - 20 mg/dL   Creatinine, Ser 0.74 0.44 - 1.00 mg/dL   Calcium 9.8 8.9 - 10.3 mg/dL   Total Protein 7.1 6.5 - 8.1 g/dL   Albumin 4.0 3.5 - 5.0 g/dL   AST 15 15 - 41 U/L    ALT 10 (L) 14 - 54 U/L   Alkaline Phosphatase 80 38 - 126 U/L   Total Bilirubin 1.3 (H) 0.3 - 1.2 mg/dL   GFR calc non Af Amer >60 >60 mL/min   GFR calc Af Amer >60 >60 mL/min   Anion gap 8 5 - 15  Troponin I  Result Value Ref Range   Troponin I <0.03 <0.03 ng/mL   Dg Chest 2 View Result Date: 01/01/2017 CLINICAL DATA:  Evaluate for pneumonia, fall 2 weeks ago in bedroom, pain in left anterior mid to lower ribs, hx of non hodgkins lymphoma, pt has 2 new left lung nodules and adrenal gland tumor found, weakness EXAM: CHEST  2 VIEW COMPARISON:  Chest CT 10/13/2016 FINDINGS: Normal mediastinum and cardiac silhouette. Normal pulmonary vasculature. No evidence of effusion, infiltrate, or pneumothorax. No acute bony abnormality. IMPRESSION: No acute cardiopulmonary process. Electronically Signed   By: Suzy Bouchard M.D.   On: 01/01/2017 12:38   Ct Chest W Contrast Result Date: 01/01/2017 CLINICAL DATA:  Left-sided rib pain since fall 3 weeks  ago. Cough and congestion. EXAM: CT CHEST, ABDOMEN, AND PELVIS WITH CONTRAST TECHNIQUE: Multidetector CT imaging of the chest, abdomen and pelvis was performed following the standard protocol during bolus administration of intravenous contrast. CONTRAST:  1103mL ISOVUE-300 IOPAMIDOL (ISOVUE-300) INJECTION 61% COMPARISON:  Chest radiograph 01/01/2017, body CT 10/13/2016 FINDINGS: CT CHEST FINDINGS Cardiovascular: Normal heart size. No pericardial effusion. Heavy calcific atherosclerotic disease of the coronary arteries and aorta. No central pulmonary embolus. Mediastinum/Nodes: No enlarged mediastinal, hilar, or axillary lymph nodes. Thyroid gland, trachea, and esophagus demonstrate no significant findings. Lungs/Pleura: Upper lobe predominant moderate to severe emphysema. Stable 4.5 mm ground-glass right upper lobe pulmonary nodule, image 50/139. Second tiny 3 mm subpleural nodule more superiorly in the right upper lobe, image 41/139, sequence 3. Nodular subpleural  scarring in the right apex posteriorly. No lobar consolidation. Musculoskeletal: Minimally displaced left lateral ninth and eighth rib fractures, with underlying mild pulmonary contusion. CT ABDOMEN PELVIS FINDINGS Hepatobiliary: No focal liver abnormality is seen. No gallstones, gallbladder wall thickening, or biliary dilatation. Pancreas: Unremarkable. No pancreatic ductal dilatation or surrounding inflammatory changes. Spleen: Normal in size without focal abnormality. Adrenals/Urinary Tract: Adrenal glands are unremarkable. Kidneys are without renal calculi, focal lesion, or hydronephrosis. Bilateral renal cysts. Bladder is unremarkable. Stomach/Bowel: Stomach is within normal limits. No evidence of appendicitis. No evidence of bowel wall thickening, distention, or inflammatory changes. Vascular/Lymphatic: Aortic atherosclerosis. No enlarged abdominal or pelvic lymph nodes. Reproductive: Status post hysterectomy. No adnexal masses. Other: No abdominal wall hernia or abnormality. No abdominopelvic ascites. Musculoskeletal: No suspicious osseous lesions. IMPRESSION: Minimally displaced left eighth and ninth lateral rib fractures with underlying mild pulmonary contusion. Two right pulmonary nodules, the larger measuring 4.5 mm and stable. No follow-up needed if patient is low-risk. Non-contrast chest CT can be considered in 12 months if patient is high-risk. This recommendation follows the consensus statement: Guidelines for Management of Incidental Pulmonary Nodules Detected on CT Images: From the Fleischner Society 2017; Radiology 2017; 284:228-243. Moderate to advanced upper lobe predominant emphysema. Calcific atherosclerotic disease of the aorta and coronary arteries. Electronically Signed   By: Fidela Salisbury M.D.   On: 01/01/2017 16:34   Ct Abdomen Pelvis W Contrast Result Date: 01/01/2017 CLINICAL DATA:  Left-sided rib pain since fall 3 weeks ago. Cough and congestion. EXAM: CT CHEST, ABDOMEN, AND  PELVIS WITH CONTRAST TECHNIQUE: Multidetector CT imaging of the chest, abdomen and pelvis was performed following the standard protocol during bolus administration of intravenous contrast. CONTRAST:  115mL ISOVUE-300 IOPAMIDOL (ISOVUE-300) INJECTION 61% COMPARISON:  Chest radiograph 01/01/2017, body CT 10/13/2016 FINDINGS: CT CHEST FINDINGS Cardiovascular: Normal heart size. No pericardial effusion. Heavy calcific atherosclerotic disease of the coronary arteries and aorta. No central pulmonary embolus. Mediastinum/Nodes: No enlarged mediastinal, hilar, or axillary lymph nodes. Thyroid gland, trachea, and esophagus demonstrate no significant findings. Lungs/Pleura: Upper lobe predominant moderate to severe emphysema. Stable 4.5 mm ground-glass right upper lobe pulmonary nodule, image 50/139. Second tiny 3 mm subpleural nodule more superiorly in the right upper lobe, image 41/139, sequence 3. Nodular subpleural scarring in the right apex posteriorly. No lobar consolidation. Musculoskeletal: Minimally displaced left lateral ninth and eighth rib fractures, with underlying mild pulmonary contusion. CT ABDOMEN PELVIS FINDINGS Hepatobiliary: No focal liver abnormality is seen. No gallstones, gallbladder wall thickening, or biliary dilatation. Pancreas: Unremarkable. No pancreatic ductal dilatation or surrounding inflammatory changes. Spleen: Normal in size without focal abnormality. Adrenals/Urinary Tract: Adrenal glands are unremarkable. Kidneys are without renal calculi, focal lesion, or hydronephrosis. Bilateral renal cysts. Bladder is unremarkable. Stomach/Bowel:  Stomach is within normal limits. No evidence of appendicitis. No evidence of bowel wall thickening, distention, or inflammatory changes. Vascular/Lymphatic: Aortic atherosclerosis. No enlarged abdominal or pelvic lymph nodes. Reproductive: Status post hysterectomy. No adnexal masses. Other: No abdominal wall hernia or abnormality. No abdominopelvic ascites.  Musculoskeletal: No suspicious osseous lesions. IMPRESSION: Minimally displaced left eighth and ninth lateral rib fractures with underlying mild pulmonary contusion. Two right pulmonary nodules, the larger measuring 4.5 mm and stable. No follow-up needed if patient is low-risk. Non-contrast chest CT can be considered in 12 months if patient is high-risk. This recommendation follows the consensus statement: Guidelines for Management of Incidental Pulmonary Nodules Detected on CT Images: From the Fleischner Society 2017; Radiology 2017; 284:228-243. Moderate to advanced upper lobe predominant emphysema. Calcific atherosclerotic disease of the aorta and coronary arteries. Electronically Signed   By: Fidela Salisbury M.D.   On: 01/01/2017 16:34      Francine Graven, DO 01/01/17 1808

## 2017-01-01 NOTE — ED Notes (Signed)
Report given to Amy, RN for room 311.

## 2017-01-02 DIAGNOSIS — S27321A Contusion of lung, unilateral, initial encounter: Secondary | ICD-10-CM | POA: Diagnosis not present

## 2017-01-02 DIAGNOSIS — R52 Pain, unspecified: Secondary | ICD-10-CM | POA: Diagnosis not present

## 2017-01-02 LAB — COMPREHENSIVE METABOLIC PANEL
ALBUMIN: 3.5 g/dL (ref 3.5–5.0)
ALT: 10 U/L — ABNORMAL LOW (ref 14–54)
ANION GAP: 7 (ref 5–15)
AST: 13 U/L — ABNORMAL LOW (ref 15–41)
Alkaline Phosphatase: 63 U/L (ref 38–126)
BUN: 17 mg/dL (ref 6–20)
CALCIUM: 9.2 mg/dL (ref 8.9–10.3)
CHLORIDE: 101 mmol/L (ref 101–111)
CO2: 31 mmol/L (ref 22–32)
Creatinine, Ser: 0.79 mg/dL (ref 0.44–1.00)
GFR calc non Af Amer: >60 mL/min (ref >60)
Glucose, Bld: 127 mg/dL — ABNORMAL HIGH (ref 65–99)
POTASSIUM: 4.1 mmol/L (ref 3.5–5.1)
SODIUM: 139 mmol/L (ref 135–145)
Total Bilirubin: 0.7 mg/dL (ref 0.3–1.2)
Total Protein: 6.1 g/dL — ABNORMAL LOW (ref 6.5–8.1)

## 2017-01-02 LAB — CBC
HCT: 36.8 % (ref 36.0–46.0)
Hemoglobin: 11.9 g/dL — ABNORMAL LOW (ref 12.0–15.0)
MCH: 31 pg (ref 26.0–34.0)
MCHC: 32.3 g/dL (ref 30.0–36.0)
MCV: 95.8 fL (ref 78.0–100.0)
PLATELETS: 240 10*3/uL (ref 150–400)
RBC: 3.84 MIL/uL — ABNORMAL LOW (ref 3.87–5.11)
RDW: 13.3 % (ref 11.5–15.5)
WBC: 7.1 10*3/uL (ref 4.0–10.5)

## 2017-01-02 LAB — GLUCOSE, CAPILLARY
GLUCOSE-CAPILLARY: 111 mg/dL — AB (ref 65–99)
GLUCOSE-CAPILLARY: 156 mg/dL — AB (ref 65–99)
GLUCOSE-CAPILLARY: 148 mg/dL — AB (ref 65–99)
GLUCOSE-CAPILLARY: 141 mg/dL — AB (ref 65–99)

## 2017-01-02 LAB — MAGNESIUM: Magnesium: 1.9 mg/dL (ref 1.7–2.4)

## 2017-01-02 MED ORDER — OXYCODONE-ACETAMINOPHEN 5-325 MG PO TABS
1.0000 | ORAL_TABLET | Freq: Three times a day (TID) | ORAL | Status: DC | PRN
  Administered 2017-01-02 – 2017-01-03 (×2): 2 via ORAL
  Filled 2017-01-02 (×3): qty 2

## 2017-01-02 NOTE — Care Management Note (Signed)
Case Management Note  Patient Details  Name: Marie Holmes MRN: [id number] Date of Birth: December 23, 1935  Subjective/Objective:   Adm with rib fractures. From home, daughter lives with her. Walks with cane. Has home continuous oxygen with [employer]. PT eval pending.                 Action/Plan: CM following for needs.   Expected Discharge Date:    01/02/2017              Expected Discharge Plan:     In-House Referral:     Discharge planning Services  CM Consult  Post Acute Care Choice:    Choice offered to:     DME Arranged:    DME Agency:     HH Arranged:    HH Agency:     Status of Service:  In process, will continue to follow  If discussed at Long Length of Stay Meetings, dates discussed:    Additional Comments:  Lemarcus Baggerly, Chauncey Reading, RN 01/02/2017, 3:39 PM

## 2017-01-02 NOTE — Progress Notes (Signed)
[facility] PROGRESS NOTE  Marie Holmes [id number]:[id number] DOB: 1935-06-16 DOA: 01/01/2017 PCP: Celene Squibb, MD  Brief summary    81 y.o. female with PMH of hypertension, insulin-dependent type 2 diabetes, Copd on home o2, presented to the ED due to left sided pain,  report mechanical fall  at home a few weeks ago.  She otherwise denies fever, no increased oxygen requirement, no syncope, no lower extremity edema.   ED course: Vital signs are stable.  Basic lab work unremarkable.  Troponin negative.  EKG no acute changes. CT chest /ab/pel: Minimally displaced left eighth and ninth lateral rib fractures with underlying mild pulmonary contusion.  She has significant left-sided pain required IV Dilaudid.  Hospitalist called to further manage the patient due to poor pain control.  Assessment/Plan:  Acute pain. Minimally displaced left eighth and ninth lateral rib fractures with underlying mild pulmonary contusion. S/p mechanical fall a few weeks ago.  -slowly improving with percocet, iv dilaudid helped. Increased percocet to 1-2 tabs. Cont Pain control, incentive spirometer -obtain pt eval   COPD /chronic hypoxic respiratory failure on home  o2. Stable, no wheezing, no cough, Continue home meds  add duoneb  Insulin dependent dm2. Cont ssi  HTN. Continue home meds. Cont Lopressor/cozaar  Right renal mass.chronic right flank pain. 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 She is status post definitive radiotherapy. CT ab done in 06/2016 due to right flank pain was concerning of right renal mass,  -patient reports seeing nephrologist as outpatient for further work up. Repeat CT ab /pel: report adrenal/urinary unremarkable -d/w patient, recommended to continue outpatient follow up at discharge   Code Status: DNR Family Communication: d/w patient, RN (indicate  person spoken with, relationship, and if by phone, the number) Disposition Plan: home 24-48 hrs.    Consultants:  none  Procedures:  none  Antibiotics: Anti-infectives (From admission, onward)   Start     Dose/Rate Route Frequency Ordered Stop   01/01/17 1345  levofloxacin (LEVAQUIN) tablet 750 mg     750 mg Oral  Once 01/01/17 1333 01/01/17 1406        (indicate start date, and stop date if known)  HPI/Subjective: Alert. Pain  Is not well controlled. Reports taking norco at home.   Objective: Vitals:   01/02/17 0623 01/02/17 0805  BP: (!) 120/57   Pulse: 71   Resp: 17   Temp: 98.2 F (36.8 C)   SpO2: 98% 98%    Intake/Output Summary (Last 24 hours) at 01/02/2017 1021 Last data filed at 01/02/2017 0925 Gross per 24 hour  Intake 1240 ml  Output -  Net 1240 ml   Filed Weights   01/01/17 1100  Weight: 56.7 kg (125 lb)    Exam:   General:  No distress   Cardiovascular: s1,s2 rrr  Respiratory: CTA BL  Abdomen: soft, nt, nd   Musculoskeletal: no leg edema    Data Reviewed: Basic Metabolic Panel: Recent Labs  Lab 01/01/17 1155 01/02/17 0602  NA 141 139  K 4.1 4.1  CL 101 101  CO2 32 31  GLUCOSE 108* 127*  BUN 16 17  CREATININE 0.74 0.79  CALCIUM 9.8 9.2  MG  --  1.9   Liver Function Tests: Recent Labs  Lab 01/01/17 1155 01/02/17 0602  AST 15 13*  ALT 10* 10*  ALKPHOS 80 63  BILITOT 1.3* 0.7  PROT 7.1  6.1*  ALBUMIN 4.0 3.5   No results for input(s): LIPASE, AMYLASE in the last 168 hours. No results for input(s): AMMONIA in the last 168 hours. CBC: Recent Labs  Lab 01/01/17 1155 01/02/17 0602  WBC 11.2* 7.1  NEUTROABS 7.8*  --   HGB 13.4 11.9*  HCT 40.2 36.8  MCV 93.7 95.8  PLT 268 240   Cardiac Enzymes: Recent Labs  Lab 01/01/17 1155  TROPONINI <0.03   BNP (last 3 results) Recent Labs    07/08/16 1536  BNP 49.0    ProBNP (last 3 results) No results for input(s): PROBNP in the last 8760 hours.  CBG: Recent  Labs  Lab 01/01/17 2104 01/02/17 0754  GLUCAP 228* 111*    No results found for this or any previous visit (from the past 240 hour(s)).   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 Normal pulmonary vasculature. No evidence of effusion, infiltrate, or pneumothorax. No acute bony abnormality. IMPRESSION: No acute cardiopulmonary process. Electronically Signed   By: Suzy Bouchard M.D.   On: 01/01/2017 12:38   Ct Chest W Contrast  Result Date: 01/01/2017 CLINICAL DATA:  Left-sided rib pain since fall 3 weeks ago. Cough and congestion. EXAM: CT CHEST, ABDOMEN, AND PELVIS WITH CONTRAST TECHNIQUE: Multidetector CT imaging of the chest, abdomen and pelvis was performed following the standard protocol during bolus administration of intravenous contrast. CONTRAST:  155mL ISOVUE-300 IOPAMIDOL (ISOVUE-300) INJECTION 61% COMPARISON:  Chest radiograph 01/01/2017, body CT 10/13/2016 FINDINGS: CT CHEST FINDINGS Cardiovascular: Normal heart size. No pericardial effusion. Heavy calcific atherosclerotic disease of the coronary arteries and aorta. No central pulmonary embolus. Mediastinum/Nodes: No enlarged mediastinal, hilar, or axillary lymph nodes. Thyroid gland, trachea, and esophagus demonstrate no significant findings. Lungs/Pleura: Upper lobe predominant moderate to severe emphysema. Stable 4.5 mm ground-glass right upper lobe pulmonary nodule, image 50/139. Second tiny 3 mm subpleural nodule more superiorly in the right upper lobe, image 41/139, sequence 3. Nodular subpleural scarring in the right apex posteriorly. No lobar consolidation. Musculoskeletal: Minimally displaced left lateral  ninth and eighth rib fractures, with underlying mild pulmonary contusion. CT ABDOMEN PELVIS FINDINGS Hepatobiliary: No focal liver abnormality is seen. No gallstones, gallbladder wall thickening, or biliary dilatation. Pancreas: Unremarkable. No pancreatic ductal dilatation or surrounding inflammatory changes. Spleen: Normal in size without focal abnormality. Adrenals/Urinary Tract: Adrenal glands are unremarkable. Kidneys are without renal calculi, focal lesion, or hydronephrosis. Bilateral renal cysts. Bladder is unremarkable. Stomach/Bowel: Stomach is within normal limits. No evidence of appendicitis. No evidence of bowel wall thickening, distention, or inflammatory changes. Vascular/Lymphatic: Aortic atherosclerosis. No enlarged abdominal or pelvic lymph nodes. Reproductive: Status post hysterectomy. No adnexal masses. Other: No abdominal wall hernia or abnormality. No abdominopelvic ascites. Musculoskeletal: No suspicious osseous lesions. IMPRESSION: Minimally displaced left eighth and ninth lateral rib fractures with underlying mild pulmonary contusion. Two right pulmonary nodules, the larger measuring 4.5 mm and stable. No follow-up needed if patient is low-risk. Non-contrast chest CT can be considered in 12 months if patient is high-risk. This recommendation follows the consensus statement: Guidelines for Management of Incidental Pulmonary Nodules Detected on CT Images: From the Fleischner Society 2017; Radiology 2017; 284:228-243. Moderate to advanced upper lobe  predominant emphysema. Calcific atherosclerotic disease of the aorta and coronary arteries. Electronically Signed   By: Fidela Salisbury M.D.   On: 01/01/2017 16:34   Ct Abdomen Pelvis W Contrast  Result Date: 01/01/2017 CLINICAL DATA:  Left-sided rib pain since fall 3 weeks ago. Cough and congestion. EXAM: CT CHEST, ABDOMEN, AND PELVIS WITH CONTRAST TECHNIQUE: Multidetector CT imaging of the chest, abdomen and pelvis was performed following  the standard protocol during bolus administration of intravenous contrast. CONTRAST:  123mL ISOVUE-300 IOPAMIDOL (ISOVUE-300) INJECTION 61% COMPARISON:  Chest radiograph 01/01/2017, body CT 10/13/2016 FINDINGS: CT CHEST FINDINGS Cardiovascular: Normal heart size. No pericardial effusion. Heavy calcific atherosclerotic disease of the coronary arteries and aorta. No central pulmonary embolus. Mediastinum/Nodes: No enlarged mediastinal, hilar, or axillary lymph nodes. Thyroid gland, trachea, and esophagus demonstrate no significant findings. Lungs/Pleura: Upper lobe predominant moderate to severe emphysema. Stable 4.5 mm ground-glass right upper lobe pulmonary nodule, image 50/139. Second tiny 3 mm subpleural nodule more superiorly in the right upper lobe, image 41/139, sequence 3. Nodular subpleural scarring in the right apex posteriorly. No lobar consolidation. Musculoskeletal: Minimally displaced left lateral ninth and eighth rib fractures, with underlying mild pulmonary contusion. CT ABDOMEN PELVIS FINDINGS Hepatobiliary: No focal liver abnormality is seen. No gallstones, gallbladder wall thickening, or biliary dilatation. Pancreas: Unremarkable. No pancreatic ductal dilatation or surrounding inflammatory changes. Spleen: Normal in size without focal abnormality. Adrenals/Urinary Tract: Adrenal glands are unremarkable. Kidneys are without renal calculi, focal lesion, or hydronephrosis. Bilateral renal cysts. Bladder is unremarkable. Stomach/Bowel: Stomach is within normal limits. No evidence of appendicitis. No evidence of bowel wall thickening, distention, or inflammatory changes. Vascular/Lymphatic: Aortic atherosclerosis. No enlarged abdominal or pelvic lymph nodes. Reproductive: Status post hysterectomy. No adnexal masses. Other: No abdominal wall hernia or abnormality. No abdominopelvic ascites. Musculoskeletal: No suspicious osseous lesions. IMPRESSION: Minimally displaced left eighth and ninth lateral rib  fractures with underlying mild pulmonary contusion. Two right pulmonary nodules, the larger measuring 4.5 mm and stable. No follow-up needed if patient is low-risk. Non-contrast chest CT can be considered in 12 months if patient is high-risk. This recommendation follows the consensus statement: Guidelines for Management of Incidental Pulmonary Nodules Detected on CT Images: From the Fleischner Society 2017; Radiology 2017; 284:228-243. Moderate to advanced upper lobe predominant emphysema. Calcific atherosclerotic disease of the aorta and coronary arteries. Electronically Signed   By: Fidela Salisbury M.D.   On: 01/01/2017 16:34    Scheduled Meds: . aspirin EC  81 mg Oral q morning - 10a  . citalopram  40 mg Oral QPM  . guaiFENesin  600 mg Oral BID  . insulin aspart  0-9 Units Subcutaneous TID WC  . ipratropium-albuterol  3 mL Nebulization TID  . LORazepam  0.5 mg Oral TID  . losartan  50 mg Oral Daily  . metoprolol tartrate  25 mg Oral BID  . mometasone-formoterol  2 puff Inhalation BID  . senna  1 tablet Oral BID  . vitamin B-12  1,000 mcg Oral Daily   Continuous Infusions:  Active Problems:   Fall    Time spent: >35 minutes     Kinnie Feil  [facility] Pager [phone]. If 7PM-7AM, please contact night-coverage at [email or web address], password [facility] 01/02/2017, 10:21 AM  LOS: 0 days

## 2017-01-02 NOTE — Plan of Care (Signed)
Pt c/o of hot and cold flashes with unremarkable WBC, temperature, and blood sugar. Provided temperature control as needed.

## 2017-01-02 NOTE — Care Management Obs Status (Signed)
[facility] NOTIFICATION   Patient Details  Name: Marie Holmes MRN: [id number] Date of Birth: 08/07/1935   Medicare Observation Status Notification Given:  Yes    Adesuwa Osgood, Chauncey Reading, RN 01/02/2017, 11:05 AM

## 2017-01-03 DIAGNOSIS — Y92009 Unspecified place in unspecified non-institutional (private) residence as the place of occurrence of the external cause: Secondary | ICD-10-CM

## 2017-01-03 DIAGNOSIS — W19XXXA Unspecified fall, initial encounter: Secondary | ICD-10-CM | POA: Diagnosis not present

## 2017-01-03 DIAGNOSIS — J9611 Chronic respiratory failure with hypoxia: Secondary | ICD-10-CM | POA: Diagnosis not present

## 2017-01-03 DIAGNOSIS — S2249XA Multiple fractures of ribs, unspecified side, initial encounter for closed fracture: Secondary | ICD-10-CM | POA: Diagnosis present

## 2017-01-03 DIAGNOSIS — E119 Type 2 diabetes mellitus without complications: Secondary | ICD-10-CM | POA: Diagnosis not present

## 2017-01-03 DIAGNOSIS — R918 Other nonspecific abnormal finding of lung field: Secondary | ICD-10-CM | POA: Diagnosis present

## 2017-01-03 DIAGNOSIS — I10 Essential (primary) hypertension: Secondary | ICD-10-CM

## 2017-01-03 DIAGNOSIS — Z794 Long term (current) use of insulin: Secondary | ICD-10-CM

## 2017-01-03 DIAGNOSIS — J449 Chronic obstructive pulmonary disease, unspecified: Secondary | ICD-10-CM | POA: Diagnosis not present

## 2017-01-03 DIAGNOSIS — S2242XA Multiple fractures of ribs, left side, initial encounter for closed fracture: Secondary | ICD-10-CM | POA: Diagnosis not present

## 2017-01-03 DIAGNOSIS — S27329A Contusion of lung, unspecified, initial encounter: Secondary | ICD-10-CM | POA: Diagnosis present

## 2017-01-03 DIAGNOSIS — S27321A Contusion of lung, unilateral, initial encounter: Secondary | ICD-10-CM

## 2017-01-03 LAB — GLUCOSE, CAPILLARY
Glucose-Capillary: 126 mg/dL — ABNORMAL HIGH (ref 65–99)
Glucose-Capillary: 191 mg/dL — ABNORMAL HIGH (ref 65–99)

## 2017-01-03 MED ORDER — ALBUTEROL SULFATE HFA 108 (90 BASE) MCG/ACT IN AERS: 2.0000 | INHALATION_SPRAY | Freq: Four times a day (QID) | RESPIRATORY_TRACT | 2 refills | Status: AC | PRN

## 2017-01-03 MED ORDER — GUAIFENESIN ER 600 MG PO TB12: 600.0000 mg | ORAL_TABLET | Freq: Two times a day (BID) | ORAL | 0 refills | Status: AC

## 2017-01-03 MED ORDER — TRAZODONE HCL 50 MG PO TABS: 50.0000 mg | ORAL_TABLET | Freq: Every evening | ORAL | 0 refills | Status: AC | PRN

## 2017-01-03 NOTE — Plan of Care (Signed)
Pt managed pain well and rested duration of shift without acute distress.

## 2017-01-03 NOTE — Evaluation (Signed)
Occupational Therapy Evaluation Patient Details Name: Marie Holmes MRN: [id number] DOB: 08-Mar-1935 Today's Date: 01/03/2017    History of Present Illness Marie Holmes is a 81 y.o. female H/o hypertension, insulin-dependent type 2 diabetes, Copd on home o2, presented to the ED due to left sided pain,  report mechanical fall  at home a few weeks ago.  She otherwise denies fever, no increased oxygen requirement, no syncope, no lower extremity edema. Minimally displaced left eighth and ninth lateral rib fractures with underlying mild pulmonary contusion.  She has significant left-sided pain required IV Dilaudid.  Hospitalist called to further manage the patient due to poor pain control.   Clinical Impression   Pt received sitting up in bed finishing breakfast, agreeable to OT evaluation. Pt reports her fall was an accident where she got tangled up in her covers when getting out of bed. Pt demonstrates baseline independence in B/ADL completion, reports daughter assists with opening items at home due to tremors. Pt's daughter is with pt when not working and provides assistance as needed. No further OT services required at this time as pt is at her baseline functioning.     Follow Up Recommendations  No OT follow up;Supervision - Intermittent    Equipment Recommendations  None recommended by OT       Precautions / Restrictions Precautions Precautions: None Restrictions Weight Bearing Restrictions: No      Mobility Bed Mobility Overal bed mobility: Modified Independent                Transfers Overall transfer level: Modified independent   Transfers: Sit to/from Stand Sit to Stand: Modified independent (Device/Increase time)                  ADL either performed or assessed with clinical judgement   ADL Overall ADL's : Needs assistance/impaired Eating/Feeding: Set up;Bed level Eating/Feeding Details (indicate cue type and reason): Pt requiring assistance for  opening packages due to tremors Grooming: Wash/dry hands;Oral care;Modified independent;Standing Grooming Details (indicate cue type and reason): Pt performed grooming tasks at sink without difficulty                 Toilet Transfer: Modified Independent;Ambulation;Regular Toilet   Toileting- Clothing Manipulation and Hygiene: Modified independent;Sit to/from stand       Functional mobility during ADLs: Supervision/safety       Vision Baseline Vision/History: No visual deficits Patient Visual Report: No change from baseline Vision Assessment?: No apparent visual deficits            Pertinent Vitals/Pain Pain Assessment: 0-10 Pain Score: 7  Pain Location: left ribs Pain Descriptors / Indicators: Aching;Sore Pain Intervention(s): Limited activity within patient's tolerance;Monitored during session;Repositioned     Hand Dominance Right   Extremity/Trunk Assessment Upper Extremity Assessment Upper Extremity Assessment: Overall WFL for tasks assessed   Lower Extremity Assessment Lower Extremity Assessment: Defer to PT evaluation   Cervical / Trunk Assessment Cervical / Trunk Assessment: Normal   Communication Communication Communication: No difficulties   Cognition Arousal/Alertness: Awake/alert Behavior During Therapy: WFL for tasks assessed/performed Overall Cognitive Status: Within Functional Limits for tasks assessed                                                Home Living Family/patient expects to be discharged to:: Private residence Living Arrangements: Children(daughter lives with pt)  Available Help at Discharge: Family;Available PRN/intermittently(dtr works during day) Type of Home: House Home Access: Stairs to enter Technical brewer of Steps: 4 Entrance Stairs-Rails: None Home Layout: One level     Bathroom Shower/Tub: Teacher, early years/pre: Standard     Home Equipment: Cane - single point           Prior Functioning/Environment Level of Independence: Independent with assistive device(s)        Comments: Pt used cane out in community.         OT Problem List: Decreased activity tolerance;Impaired balance (sitting and/or standing)       AM-PAC PT "6 Clicks" Daily Activity     Outcome Measure Help from another person eating meals?: None Help from another person taking care of personal grooming?: A Little Help from another person toileting, which includes using toliet, bedpan, or urinal?: None Help from another person bathing (including washing, rinsing, drying)?: None Help from another person to put on and taking off regular upper body clothing?: None Help from another person to put on and taking off regular lower body clothing?: None 6 Click Score: 23   End of Session    Activity Tolerance: Patient tolerated treatment well Patient left: in chair;with call bell/phone within reach  OT Visit Diagnosis: History of falling (Z91.81)                Time: 0109-3235 OT Time Calculation (min): 22 min Charges:  OT General Charges $OT Visit: 1 Visit OT Evaluation $OT Eval Low Complexity: 1 Low G-Codes: OT G-codes **NOT FOR INPATIENT CLASS** Functional Assessment Tool Used: AM-PAC 6 Clicks Daily Activity Functional Limitation: Self care Self Care Current Status (T7322): At least 1 percent but less than 20 percent impaired, limited or restricted Self Care Goal Status (G2542): At least 1 percent but less than 20 percent impaired, limited or restricted Self Care Discharge Status [phone]): At least 1 percent but less than 20 percent impaired, limited or restricted    Marie Holmes, OTR/L  [phone] 01/03/2017, 9:20 AM

## 2017-01-03 NOTE — Care Management Note (Signed)
Case Management Note  Patient Details  Name: ELLIANNE GOWEN MRN: [id number] Date of Birth: 12-20-1935   Expected Discharge Date:      01/03/2017            Expected Discharge Plan:  Home/Self Care  In-House Referral:  NA  Discharge planning Services  CM Consult  Post Acute Care Choice:  NA Choice offered to:  NA  Status of Service:  Completed, signed off  If discussed at Long Length of Stay Meetings, dates discussed:    Additional Comments: DC home today with self care. Declines HH. Daughter stays with her and she has home oxygen pta.  Sherald Barge, RN 01/03/2017, 12:56 PM

## 2017-01-03 NOTE — Evaluation (Signed)
Physical Therapy Evaluation Patient Details Name: Marie Holmes MRN: [id number] DOB: 04-Aug-1935 Today's Date: 01/03/2017   History of Present Illness  Marie Holmes is a 81 y.o. female H/o hypertension, insulin-dependent type 2 diabetes, Copd on home o2, presented to the ED due to left sided pain,  report mechanical fall  at home a few weeks ago.  She otherwise denies fever, no increased oxygen requirement, no syncope, no lower extremity edema.    Clinical Impression  Patient functioning at baseline for functional mobility and gait.  Patient discharged from physical therapy to care of nursing staff for ambulation daily as tolerated for length of stay.    Follow Up Recommendations No PT follow up(patient declined home health PT follow up)    Equipment Recommendations  None recommended by PT    Recommendations for Other Services       Precautions / Restrictions Precautions Precautions: None Restrictions Weight Bearing Restrictions: No      Mobility  Bed Mobility Overal bed mobility: Modified Independent             General bed mobility comments: takes longer than usual time to move bed sheets  Transfers Overall transfer level: Independent   Transfers: Sit to/from [employer] to Stand: Independent Stand pivot transfers: Independent          Ambulation/Gait Ambulation/Gait assistance: Independent Ambulation Distance (Feet): 75 Feet Assistive device: None Gait Pattern/deviations: WFL(Within Functional Limits)   Gait velocity interpretation: at or above normal speed for age/gender General Gait Details: Patient demonstrates good return walking in hallways without loss of balance while on 2 LPM O2  Stairs Stairs: Yes Stairs assistance: Modified independent (Device/Increase time) Stair Management: One rail Right Number of Stairs: 5 General stair comments: Demonstrates good return going up/down steps without loss of balance using 1  siderail  Wheelchair Mobility    Modified Rankin (Stroke Patients Only)       Balance Overall balance assessment: No apparent balance deficits (not formally assessed)                                           Pertinent Vitals/Pain Pain Score: 8  Pain Location: left ribs Pain Descriptors / Indicators: Aching;Sore Pain Intervention(s): Limited activity within patient's tolerance;Monitored during session    [place] expects to be discharged to:: Private residence Living Arrangements: Children Available Help at Discharge: Family;Available PRN/intermittently Type of Home: House Home Access: Stairs to enter Entrance Stairs-Rails: None Entrance Stairs-Number of Steps: 4 Home Layout: One level Home Equipment: Cane - single point      Prior Function Level of Independence: Independent         Comments: Pt used cane out in community.      Hand Dominance   Dominant Hand: Right    Extremity/Trunk Assessment   Upper Extremity Assessment Upper Extremity Assessment: Defer to OT evaluation    Lower Extremity Assessment Lower Extremity Assessment: Overall WFL for tasks assessed    Cervical / Trunk Assessment Cervical / Trunk Assessment: Normal  Communication   Communication: No difficulties  Cognition Arousal/Alertness: Awake/alert Behavior During Therapy: WFL for tasks assessed/performed Overall Cognitive Status: Within Functional Limits for tasks assessed  General Comments      Exercises     Assessment/Plan    PT Assessment Patent does not need any further PT services  PT Problem List         PT Treatment Interventions      PT Goals (Current goals can be found in the Care Plan section)  Acute Rehab PT Goals Patient Stated Goal: return home PT Goal Formulation: With patient/family Time For Goal Achievement: 01/03/17 Potential to Achieve Goals: Good     Frequency     Barriers to discharge        Co-evaluation               AM-PAC PT "6 Clicks" Daily Activity  Outcome Measure Difficulty turning over in bed (including adjusting bedclothes, sheets and blankets)?: None Difficulty moving from lying on back to sitting on the side of the bed? : None Difficulty sitting down on and standing up from a chair with arms (e.g., wheelchair, bedside commode, etc,.)?: None Help needed moving to and from a bed to chair (including a wheelchair)?: None Help needed walking in hospital room?: None Help needed climbing 3-5 steps with a railing? : None 6 Click Score: 24    End of Session   Activity Tolerance: Patient tolerated treatment well Patient left: in chair;with call bell/phone within reach;with family/visitor present Nurse Communication: Mobility status PT Visit Diagnosis: Unsteadiness on feet (R26.81);Other abnormalities of gait and mobility (R26.89);Muscle weakness (generalized) (M62.81)    Time: 7867-6720 PT Time Calculation (min) (ACUTE ONLY): 20 min   Charges:   PT Evaluation $PT Eval Low Complexity: 1 Low PT Treatments $Gait Training: 8-22 mins   PT G Codes:   PT G-Codes **NOT FOR INPATIENT CLASS** Functional Assessment Tool Used: AM-PAC 6 Clicks Basic Mobility Functional Limitation: Mobility: Walking and moving around Mobility: Walking and Moving Around Current Status (N4709): 0 percent impaired, limited or restricted Mobility: Walking and Moving Around Goal Status (G2836): 0 percent impaired, limited or restricted Mobility: Walking and Moving Around Discharge Status (O2947): 0 percent impaired, limited or restricted    2:33 PM, 01/03/17 Lonell Grandchild, MPT Physical Therapist with [facility] 336 [phone] office [phone] mobile phone

## 2017-01-03 NOTE — Discharge Summary (Signed)
Physician Discharge Summary  Marie Holmes:[id number] DOB: 1935-03-10 DOA: 01/01/2017  PCP: Celene Squibb, MD  Admit date: 01/01/2017 Discharge date: 01/03/2017  Admitted From: Home Disposition: Home  Recommendations for Outpatient Follow-up:  1. Follow up with PCP in 1-2 weeks 2. Please obtain BMP/CBC in one week 3. Repeat CT chest in 12 months for follow-up right-sided pulmonary nodules 4. Consider further abdominal imaging for right-sided renal mass  Home Health: Equipment/Devices:  Discharge Condition: Stable CODE STATUS: DNR Diet recommendation: Heart Healthy / Carb Modified   Brief/Interim Summary: 81 y.o.femalewith PMH ofhypertension, insulin-dependent type 2 diabetes, Copd on home o2, presented to the ED due toleft sidedpain, report mechanical fallat home a few weeks ago.She otherwise denies fever,no increased oxygen requirement,no syncope,no lower extremity edema.  ED course: Vital signs are stable.Basic lab work unremarkable.Troponin negative.EKG no acute changes. CT chest /ab/pel:Minimally displaced left eighth and ninth lateral rib fractures with underlying mild pulmonary contusion.She has significant left-sided pain required IV Dilaudid.Hospitalist called to further manage the patient due to poorpain control.    Discharge Diagnoses:  Active Problems:   Essential hypertension   COPD (chronic obstructive pulmonary disease) (HCC)   Diabetes mellitus, insulin dependent (IDDM), controlled ([place])   Renal mass, right   Fall   Chronic respiratory failure with hypoxia (HCC)   Multiple rib fractures   Pulmonary contusion   Pulmonary nodules  Acute pain. Minimally displaced left eighth and ninth lateral rib fractures with underlying mild pulmonary contusion. S/p mechanical fall a few weeks ago.  -Patient was started on intravenous narcotics and was transitioned to oral therapy.  Pain appears to be reasonably controlled.  Cont Pain control,  incentive spirometer   COPD /chronic hypoxic respiratory failure on homeo2. Stable, no wheezing, no cough, Continue home meds.  Respiratory status appears to be at baseline  Insulin dependent dm2.  Blood sugars remained stable.  Resume basal insulin on discharge  HTN. Continue home meds. Cont Lopressor/cozaar  Right renal mass.chronic right flank pain. 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 She is status post definitive radiotherapy. CT ab done in 06/2016 due to right flank pain was concerning of right renal mass,  -patient reports seeing nephrologist as outpatient for further work up. Repeat CT ab /pel: report adrenal/urinary unremarkable -d/w patient, recommended to continue outpatient follow up at discharge      Discharge Instructions  Discharge Instructions    Diet - low sodium heart healthy   Complete by:  As directed    Increase activity slowly   Complete by:  As directed      Allergies as of 01/03/2017      Reactions   Sulfonamide Derivatives Anaphylaxis   Metformin And Related Diarrhea      Medication List    STOP taking these medications   oxyCODONE-acetaminophen 5-325 MG tablet Commonly known as:  PERCOCET/ROXICET     TAKE these medications   albuterol (2.5 MG/3ML) 0.083% nebulizer solution Commonly known as:  PROVENTIL Take 2.5 mg 3 (three) times daily by nebulization. 3 treatmens daily What changed:  Another medication with the same name was added. Make sure you understand how and when to take each.   albuterol 108 (90 Base) MCG/ACT inhaler Commonly known as:  PROVENTIL HFA;VENTOLIN HFA Inhale 2 puffs every 6 (six) hours as needed into the lungs for wheezing or shortness of breath. What changed:  You were already taking a medication  with the same name, and this prescription was added. Make sure you understand how and when to take  each.   aspirin EC 81 MG tablet Take 81 mg by mouth every morning.   budesonide-formoterol 160-4.5 MCG/ACT inhaler Commonly known as:  SYMBICORT Inhale 2 puffs into the lungs 2 (two) times daily. What changed:    when to take this  reasons to take this   citalopram 40 MG tablet Commonly known as:  CELEXA Take 1 tablet (40 mg total) by mouth daily. What changed:  when to take this   guaiFENesin 600 MG 12 hr tablet Commonly known as:  MUCINEX Take 1 tablet (600 mg total) 2 (two) times daily by mouth.   HYDROcodone-acetaminophen 7.5-325 MG tablet Commonly known as:  NORCO Take 1 tablet 3 (three) times daily as needed by mouth for moderate pain or severe pain.   LORazepam 0.5 MG tablet Commonly known as:  ATIVAN Take 0.5 mg 3 (three) times daily by mouth.   losartan 50 MG tablet Commonly known as:  COZAAR TAKE ONE TABLET BY MOUTH ONCE DAILY IN THE MORNING   metoprolol tartrate 25 MG tablet Commonly known as:  LOPRESSOR Take 25 mg 2 (two) times daily by mouth.   ondansetron 4 MG tablet Commonly known as:  ZOFRAN Take 1 tablet (4 mg total) by mouth every 6 (six) hours.   OXYGEN Inhale 2 L into the lungs continuous.   traZODone 50 MG tablet Commonly known as:  DESYREL Take 1 tablet (50 mg total) at bedtime as needed by mouth for sleep.   TRESIBA FLEXTOUCH 200 UNIT/ML Sopn Generic drug:  Insulin Degludec Inject 16 Units every evening into the skin.   vitamin B-12 500 MCG tablet Commonly known as:  CYANOCOBALAMIN Take 1,000 mcg by mouth daily.       Allergies  Allergen Reactions  . Sulfonamide Derivatives Anaphylaxis  . 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 Normal pulmonary vasculature. No evidence of effusion, infiltrate, or pneumothorax. No acute bony abnormality. IMPRESSION: No acute cardiopulmonary process. Electronically Signed   By: Suzy Bouchard M.D.   On: 01/01/2017 12:38   Ct Chest W Contrast  Result Date: 01/01/2017 CLINICAL DATA:  Left-sided rib pain since fall 3 weeks ago. Cough and congestion. EXAM: CT CHEST, ABDOMEN, AND PELVIS WITH CONTRAST TECHNIQUE: Multidetector CT imaging of the chest, abdomen and pelvis was performed following the standard protocol during bolus administration of intravenous contrast. CONTRAST:  127mL ISOVUE-300 IOPAMIDOL (ISOVUE-300) INJECTION 61% COMPARISON:  Chest radiograph 01/01/2017, body CT 10/13/2016 FINDINGS: CT CHEST FINDINGS Cardiovascular: Normal heart size. No pericardial effusion. Heavy calcific atherosclerotic disease of the coronary arteries and aorta. No central pulmonary embolus. Mediastinum/Nodes: No enlarged mediastinal, hilar, or axillary lymph nodes. Thyroid gland, trachea, and esophagus demonstrate no significant findings. Lungs/Pleura: Upper lobe predominant moderate to severe emphysema. Stable 4.5 mm ground-glass right upper lobe pulmonary nodule, image 50/139. Second tiny 3 mm subpleural nodule more superiorly in the right upper lobe, image 41/139, sequence 3. Nodular subpleural scarring in the right apex posteriorly. No lobar consolidation. Musculoskeletal: Minimally displaced left lateral ninth and  eighth rib fractures, with underlying mild pulmonary contusion. CT ABDOMEN PELVIS FINDINGS Hepatobiliary: No focal liver abnormality is seen. No gallstones, gallbladder wall thickening, or biliary dilatation. Pancreas: Unremarkable. No pancreatic ductal dilatation or surrounding inflammatory changes. Spleen: Normal in size without focal abnormality. Adrenals/Urinary Tract: Adrenal glands are unremarkable. Kidneys  are without renal calculi, focal lesion, or hydronephrosis. Bilateral renal cysts. Bladder is unremarkable. Stomach/Bowel: Stomach is within normal limits. No evidence of appendicitis. No evidence of bowel wall thickening, distention, or inflammatory changes. Vascular/Lymphatic: Aortic atherosclerosis. No enlarged abdominal or pelvic lymph nodes. Reproductive: Status post hysterectomy. No adnexal masses. Other: No abdominal wall hernia or abnormality. No abdominopelvic ascites. Musculoskeletal: No suspicious osseous lesions. IMPRESSION: Minimally displaced left eighth and ninth lateral rib fractures with underlying mild pulmonary contusion. Two right pulmonary nodules, the larger measuring 4.5 mm and stable. No follow-up needed if patient is low-risk. Non-contrast chest CT can be considered in 12 months if patient is high-risk. This recommendation follows the consensus statement: Guidelines for Management of Incidental Pulmonary Nodules Detected on CT Images: From the Fleischner Society 2017; Radiology 2017; 284:228-243. Moderate to advanced upper lobe predominant emphysema. Calcific atherosclerotic disease of the aorta and coronary arteries. Electronically Signed   By: Fidela Salisbury M.D.   On: 01/01/2017 16:34   Ct Abdomen Pelvis W Contrast  Result Date: 01/01/2017 CLINICAL DATA:  Left-sided rib pain since fall 3 weeks ago. Cough and congestion. EXAM: CT CHEST, ABDOMEN, AND PELVIS WITH CONTRAST TECHNIQUE: Multidetector CT imaging of the chest, abdomen and pelvis was performed following the standard protocol during bolus administration of intravenous contrast. CONTRAST:  148mL ISOVUE-300 IOPAMIDOL (ISOVUE-300) INJECTION 61% COMPARISON:  Chest radiograph 01/01/2017, body CT 10/13/2016 FINDINGS: CT CHEST FINDINGS Cardiovascular: Normal heart size. No pericardial effusion. Heavy calcific atherosclerotic disease of the coronary arteries and aorta. No central pulmonary embolus. Mediastinum/Nodes: No enlarged  mediastinal, hilar, or axillary lymph nodes. Thyroid gland, trachea, and esophagus demonstrate no significant findings. Lungs/Pleura: Upper lobe predominant moderate to severe emphysema. Stable 4.5 mm ground-glass right upper lobe pulmonary nodule, image 50/139. Second tiny 3 mm subpleural nodule more superiorly in the right upper lobe, image 41/139, sequence 3. Nodular subpleural scarring in the right apex posteriorly. No lobar consolidation. Musculoskeletal: Minimally displaced left lateral ninth and eighth rib fractures, with underlying mild pulmonary contusion. CT ABDOMEN PELVIS FINDINGS Hepatobiliary: No focal liver abnormality is seen. No gallstones, gallbladder wall thickening, or biliary dilatation. Pancreas: Unremarkable. No pancreatic ductal dilatation or surrounding inflammatory changes. Spleen: Normal in size without focal abnormality. Adrenals/Urinary Tract: Adrenal glands are unremarkable. Kidneys are without renal calculi, focal lesion, or hydronephrosis. Bilateral renal cysts. Bladder is unremarkable. Stomach/Bowel: Stomach is within normal limits. No evidence of appendicitis. No evidence of bowel wall thickening, distention, or inflammatory changes. Vascular/Lymphatic: Aortic atherosclerosis. No enlarged abdominal or pelvic lymph nodes. Reproductive: Status post hysterectomy. No adnexal masses. Other: No abdominal wall hernia or abnormality. No abdominopelvic ascites. Musculoskeletal: No suspicious osseous lesions. IMPRESSION: Minimally displaced left eighth and ninth lateral rib fractures with underlying mild pulmonary contusion. Two right pulmonary nodules, the larger measuring 4.5 mm and stable. No follow-up needed if patient is low-risk. Non-contrast chest CT can be considered in 12 months if patient is high-risk. This recommendation follows the consensus statement: Guidelines for Management of Incidental Pulmonary Nodules Detected on CT Images: From the Fleischner Society 2017; Radiology 2017;  284:228-243. Moderate to advanced upper lobe predominant emphysema. Calcific atherosclerotic disease of the aorta and coronary arteries. Electronically Signed   By: Thomas Hoff  Dimitrova M.D.   On: 01/01/2017 16:34      Subjective: Pain appears to be controlled.  Productive cough is also improving.  Shortness of breath is better.  Discharge Exam: Vitals:   01/03/17 1432 01/03/17 1500  BP:  (!) 128/55  Pulse:  72  Resp:  18  Temp:  98.1 F (36.7 C)  SpO2: 98% 98%   Vitals:   01/03/17 0832 01/03/17 0833 01/03/17 1432 01/03/17 1500  BP:    (!) 128/55  Pulse:    72  Resp:    18  Temp:    98.1 F (36.7 C)  TempSrc:    Oral  SpO2: 95% 95% 98% 98%  Weight:      Height:        General: Pt is alert, awake, not in acute distress Cardiovascular: RRR, S1/S2 +, no rubs, no gallops Respiratory: Crackles at right base.  Tenderness palpation over right upper back Abdominal: Soft, NT, ND, bowel sounds + Extremities: no edema, no cyanosis    The results of significant diagnostics from this hospitalization (including imaging, microbiology, ancillary and laboratory) are listed below for reference.     Microbiology: No results found for this or any previous visit (from the past 240 hour(s)).   Labs: BNP (last 3 results) Recent Labs    07/08/16 1536  BNP 16.1   Basic Metabolic Panel: Recent Labs  Lab 01/01/17 1155 01/02/17 0602  NA 141 139  K 4.1 4.1  CL 101 101  CO2 32 31  GLUCOSE 108* 127*  BUN 16 17  CREATININE 0.74 0.79  CALCIUM 9.8 9.2  MG  --  1.9   Liver Function Tests: Recent Labs  Lab 01/01/17 1155 01/02/17 0602  AST 15 13*  ALT 10* 10*  ALKPHOS 80 63  BILITOT 1.3* 0.7  PROT 7.1 6.1*  ALBUMIN 4.0 3.5   No results for input(s): LIPASE, AMYLASE in the last 168 hours. No results for input(s): AMMONIA in the last 168 hours. CBC: Recent Labs  Lab 01/01/17 1155 01/02/17 0602  WBC 11.2* 7.1  NEUTROABS 7.8*  --   HGB 13.4 11.9*  HCT 40.2 36.8  MCV  93.7 95.8  PLT 268 240   Cardiac Enzymes: Recent Labs  Lab 01/01/17 1155  TROPONINI <0.03   BNP: Invalid input(s): POCBNP CBG: Recent Labs  Lab 01/02/17 1120 01/02/17 1539 01/02/17 2134 01/03/17 0746 01/03/17 1140  GLUCAP 156* 148* 141* 126* 191*   D-Dimer No results for input(s): DDIMER in the last 72 hours. Hgb A1c No results for input(s): HGBA1C in the last 72 hours. Lipid Profile No results for input(s): CHOL, HDL, LDLCALC, TRIG, CHOLHDL, LDLDIRECT in the last 72 hours. Thyroid function studies No results for input(s): TSH, T4TOTAL, T3FREE, THYROIDAB in the last 72 hours.  Invalid input(s): FREET3 Anemia work up No results for input(s): VITAMINB12, FOLATE, FERRITIN, TIBC, IRON, RETICCTPCT in the last 72 hours. Urinalysis    Component Value Date/Time   COLORURINE YELLOW 10/13/2016 1902   APPEARANCEUR CLEAR 10/13/2016 1902   LABSPEC 1.010 10/13/2016 1902   PHURINE 8.0 10/13/2016 1902   GLUCOSEU NEGATIVE 10/13/2016 1902   HGBUR NEGATIVE 10/13/2016 1902   BILIRUBINUR NEGATIVE 10/13/2016 1902   BILIRUBINUR neg 03/05/2015 0944   KETONESUR NEGATIVE 10/13/2016 1902   PROTEINUR NEGATIVE 10/13/2016 1902   UROBILINOGEN 0.2 03/05/2015 0944   UROBILINOGEN 0.2 10/25/2014 1839   NITRITE NEGATIVE 10/13/2016 1902   LEUKOCYTESUR NEGATIVE 10/13/2016 1902   Sepsis Labs Invalid input(s): PROCALCITONIN,  WBC,  [place] Microbiology No results found for this or any previous visit (from the past 240 hour(s)).   Time coordinating discharge: Over 30 minutes  SIGNED:   Kathie Dike, MD  [facility] 01/03/2017, 6:42 PM Pager   If 7PM-7AM, please contact night-coverage [email or web address] Password [email or web address]

## 2017-01-03 NOTE — Progress Notes (Signed)
Discharge instructions read to patient.  Patient verbalized understanding of all instructions.  Discharged to home with daughter

## 2017-01-11 DIAGNOSIS — H9209 Otalgia, unspecified ear: Secondary | ICD-10-CM | POA: Diagnosis not present

## 2017-01-11 DIAGNOSIS — R109 Unspecified abdominal pain: Secondary | ICD-10-CM | POA: Diagnosis not present

## 2017-01-11 DIAGNOSIS — Z6825 Body mass index (BMI) 25.0-25.9, adult: Secondary | ICD-10-CM | POA: Diagnosis not present

## 2017-01-11 DIAGNOSIS — Z712 Person consulting for explanation of examination or test findings: Secondary | ICD-10-CM | POA: Diagnosis not present

## 2017-01-11 DIAGNOSIS — Z79899 Other long term (current) drug therapy: Secondary | ICD-10-CM | POA: Diagnosis not present

## 2017-01-11 DIAGNOSIS — J449 Chronic obstructive pulmonary disease, unspecified: Secondary | ICD-10-CM | POA: Diagnosis not present

## 2017-01-11 DIAGNOSIS — R300 Dysuria: Secondary | ICD-10-CM | POA: Diagnosis not present

## 2017-01-11 DIAGNOSIS — D649 Anemia, unspecified: Secondary | ICD-10-CM | POA: Diagnosis not present

## 2017-01-11 DIAGNOSIS — G4700 Insomnia, unspecified: Secondary | ICD-10-CM | POA: Diagnosis not present

## 2017-01-11 DIAGNOSIS — S2241XA Multiple fractures of ribs, right side, initial encounter for closed fracture: Secondary | ICD-10-CM | POA: Diagnosis not present

## 2017-01-11 DIAGNOSIS — R070 Pain in throat: Secondary | ICD-10-CM | POA: Diagnosis not present

## 2017-01-22 DIAGNOSIS — J449 Chronic obstructive pulmonary disease, unspecified: Secondary | ICD-10-CM | POA: Diagnosis not present

## 2017-01-25 DIAGNOSIS — R197 Diarrhea, unspecified: Secondary | ICD-10-CM | POA: Diagnosis not present

## 2017-01-25 DIAGNOSIS — L723 Sebaceous cyst: Secondary | ICD-10-CM | POA: Diagnosis not present

## 2017-01-25 DIAGNOSIS — J441 Chronic obstructive pulmonary disease with (acute) exacerbation: Secondary | ICD-10-CM | POA: Diagnosis not present

## 2017-01-25 DIAGNOSIS — R0782 Intercostal pain: Secondary | ICD-10-CM | POA: Diagnosis not present

## 2017-01-25 DIAGNOSIS — S2241XA Multiple fractures of ribs, right side, initial encounter for closed fracture: Secondary | ICD-10-CM | POA: Diagnosis not present

## 2017-01-25 DIAGNOSIS — I10 Essential (primary) hypertension: Secondary | ICD-10-CM | POA: Diagnosis not present

## 2017-01-25 DIAGNOSIS — Z6826 Body mass index (BMI) 26.0-26.9, adult: Secondary | ICD-10-CM | POA: Diagnosis not present

## 2017-01-25 DIAGNOSIS — F411 Generalized anxiety disorder: Secondary | ICD-10-CM | POA: Diagnosis not present

## 2017-01-25 DIAGNOSIS — G4709 Other insomnia: Secondary | ICD-10-CM | POA: Diagnosis not present

## 2017-01-25 DIAGNOSIS — R109 Unspecified abdominal pain: Secondary | ICD-10-CM | POA: Diagnosis not present

## 2017-02-22 DIAGNOSIS — J449 Chronic obstructive pulmonary disease, unspecified: Secondary | ICD-10-CM | POA: Diagnosis not present

## 2017-02-23 DIAGNOSIS — R109 Unspecified abdominal pain: Secondary | ICD-10-CM | POA: Diagnosis not present

## 2017-02-23 DIAGNOSIS — I10 Essential (primary) hypertension: Secondary | ICD-10-CM | POA: Diagnosis not present

## 2017-02-23 DIAGNOSIS — M791 Myalgia, unspecified site: Secondary | ICD-10-CM | POA: Diagnosis not present

## 2017-02-23 DIAGNOSIS — R197 Diarrhea, unspecified: Secondary | ICD-10-CM | POA: Diagnosis not present

## 2017-02-23 DIAGNOSIS — G4700 Insomnia, unspecified: Secondary | ICD-10-CM | POA: Diagnosis not present

## 2017-02-23 DIAGNOSIS — R140 Abdominal distension (gaseous): Secondary | ICD-10-CM | POA: Diagnosis not present

## 2017-02-23 DIAGNOSIS — F411 Generalized anxiety disorder: Secondary | ICD-10-CM | POA: Diagnosis not present

## 2017-02-23 DIAGNOSIS — R1013 Epigastric pain: Secondary | ICD-10-CM | POA: Diagnosis not present

## 2017-02-23 DIAGNOSIS — J441 Chronic obstructive pulmonary disease with (acute) exacerbation: Secondary | ICD-10-CM | POA: Diagnosis not present

## 2017-02-23 DIAGNOSIS — Z6824 Body mass index (BMI) 24.0-24.9, adult: Secondary | ICD-10-CM | POA: Diagnosis not present

## 2017-03-24 DEATH — deceased

## 2017-04-10 ENCOUNTER — Ambulatory Visit (HOSPITAL_COMMUNITY): Payer: PPO

## 2017-04-14 ENCOUNTER — Other Ambulatory Visit (HOSPITAL_COMMUNITY): Payer: PPO

## 2017-04-14 ENCOUNTER — Ambulatory Visit (HOSPITAL_COMMUNITY): Payer: PPO | Admitting: Internal Medicine

## 2018-10-01 NOTE — Progress Notes (Signed)
Formatting of this note is different from the original.  CARE TEAM:  Patient Care Team:  No Pcp as PCP - General (General Practice)    ASSESSMENT  1. Primary osteoarthritis of both hips      There is no problem list on file for this patient.    PLAN  Treatment Plan:  83 year old female with history of lumbar spinal fusion with bilateral hip degenerative arthritis with left being more symptomatic than right.    We reviewed their x-rays as well as their above diagnosis with them. We discussed the options for management to include benign neglect, activity modification, anti-inflammatories, low-impact exercises, strengthening, physical therapy, weight loss, injections both steroid and hyaluronic acid, bracing, and surgical intervention.    At this time, the patient will move forward with her appointments with [facility]. If she remains symptomatic after spinal stimulator implantation, we can discuss total hip arthroplasty at that time.     Follow Up:  6 weeks no x-rays needed    Orders Placed This Encounter   ? BMI >=25 PATIENT INSTRUCTIONS & EDUCATION   ? BP Elevated Patient Education & Instructions       HISTORY OF PRESENT ILLNESS  Chief Complaint: Pain and Follow-up of the Left Hip    History of present illness: Amy Galloway is a 83 y.o. female who presents today for evaluation of bilateral hip pain left greater than right.  Patient states 7 months ago she started having worsening pain in both hips describes the pain as sharp with occasional sudden pain with catching and stiffness does feel like the movement will lessen the pain but is worse with activity especially after sedentary activity denies any history of surgery injections or trauma.  Does have pain that radiates from the groin down to the knee. Has tried lidocaine patches as well as cream.Otherwise on tramadol and gabapentin with diclofenac. Pain starts off at 3 but can get up to a 10/10. Today she rates the pain as 5/10. She received a left hip  intra-articular injection which provided a few days of relief but the pain returned. She says she can manage the pain as long as she takes her pain medications. She is currently seeing [facility], who are considering implanting a spinal stimulator for her. During her trial, she was provided with significant relief.    OBJECTIVE  Constitutional: No acute distress. Her body mass index is 35.78 kg/m.   Eyes: Sclera are nonicteric.  Respiratory: No labored breathing. No use of accessory muscles.  Cardiovascular: Regular rate and rhythm. Good peripheral perfusion. No marked edema.  Skin: No marked skin ulcers.  Neurological: No marked sensory loss noted.  Psychiatric: Alert and oriented x3.    Musculoskeletal    Examination of the both hips shows the following:  Moderate limp  Antalgic gait caused by the hip  Negative Trendelenberg sign  Leg length discrepancy of 0 cm measured supine.  No tenderness with palpation about the hip  There is pain with both active and passive range of motion  Range of motion of the hip in degrees:   Extension: 0   Flexion: 90   Abduction: 25   Adduction: 30   External Rotation: 30   Internal Rotation: 5    IMAGING / STUDIES       No imaging obtained   AP pelvis and frog-leg of the left hip were ordered, obtained, and reviewed myself.  This demonstrates left hip moderate to severe degenerative arthritis with near complete loss  of medial and inferior joint space with sclerosis and no significant early osteophyte formation.    PROCEDURES  Procedures     Note: Portions of this chart were prepared using voice-recognition software and may contain unintended word substitution errors. An addendum for corrections can be made upon request.    I, Amy Palmayan Robertson, MD, personally, performed the services described in this documentation, as scribed in my presence, and it is both accurate and complete.  Scribed by: Amy Galloway     Electronically signed by Amy Galloway, Ryan, MD at 10/06/2018   8:16 PM EDT

## 2018-11-01 IMAGING — CT CT HEAD W/O CM
3 series · 16 of 47 positions shown, 19 images · non-contrast
Comparison: Brain MRI, 08/14/2014

CLINICAL DATA: Patient states she choked on water today with
coughing and near syncopal episode today with a dull headache for 1
week.

EXAM:
CT HEAD WITHOUT CONTRAST
TECHNIQUE: Contiguous axial images were obtained from the base of the skull
through the vertex without intravenous contrast.

[Series 2: head wo · axial · 0.47mm/px · z∈[+5,+150]mm · 10 of 35 slices shown, 13 images]
[im 3/35  brain]
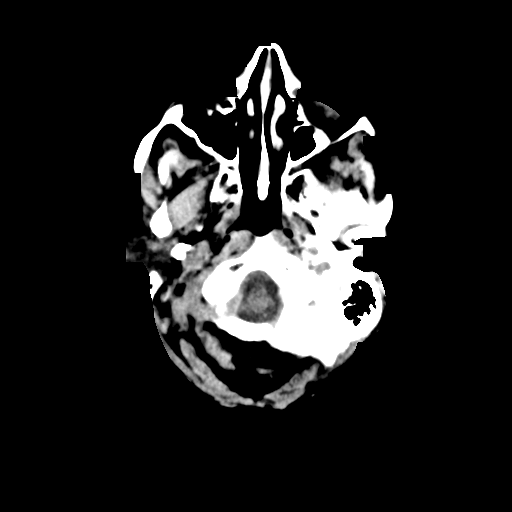
[im 3/35  bone]
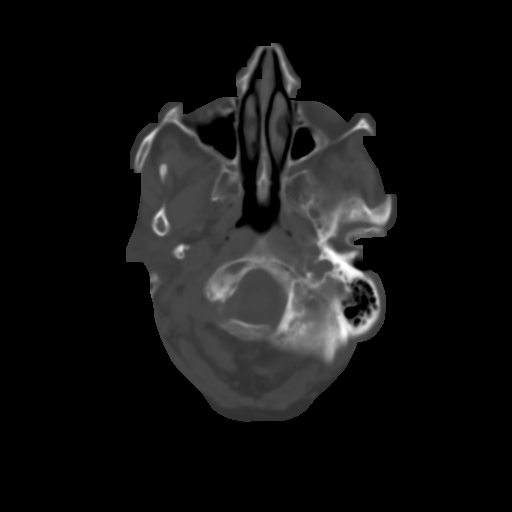
[im 6/35  brain]
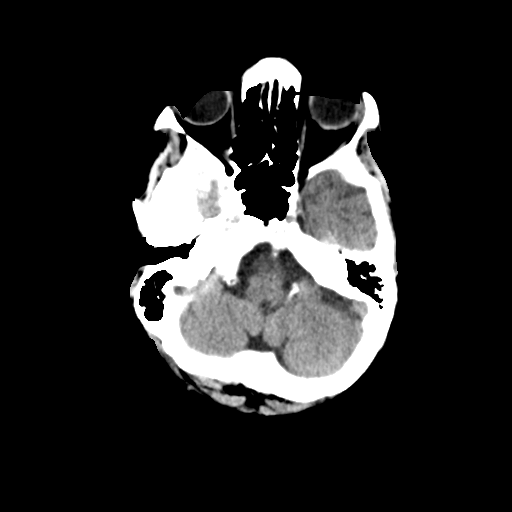
[im 10/35  brain]
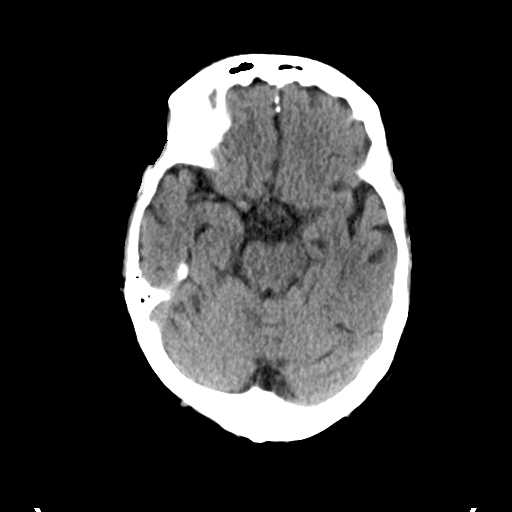
[im 12/35  brain]
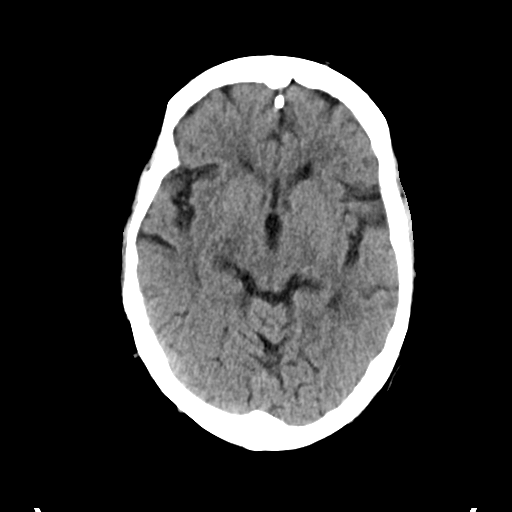
[im 16/35  brain]
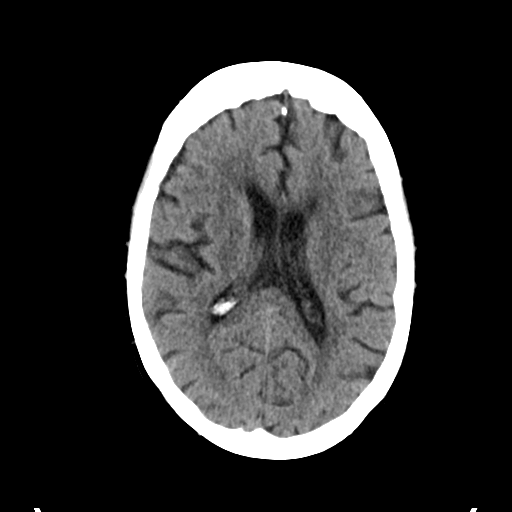
[im 16/35  bone]
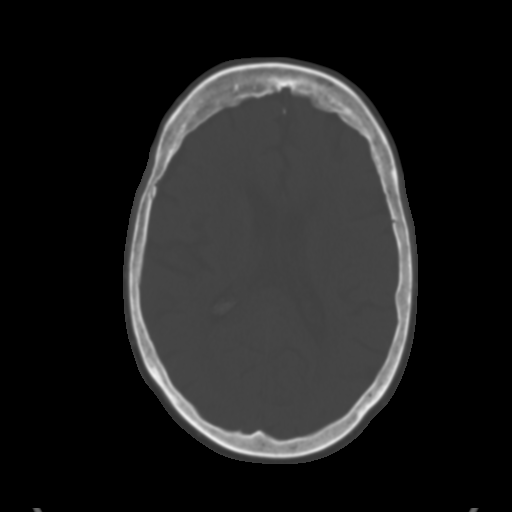
[im 19/35  brain]
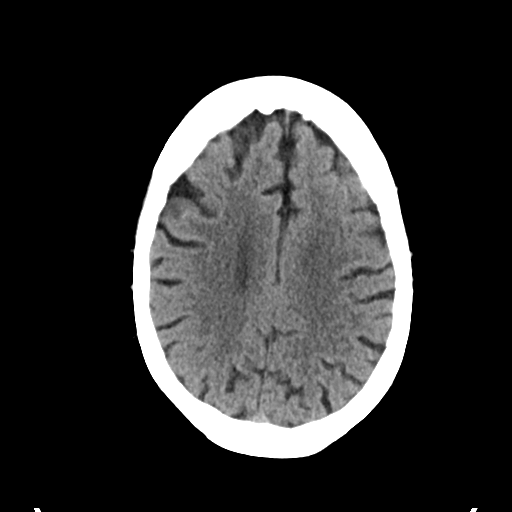
[im 23/35  brain]
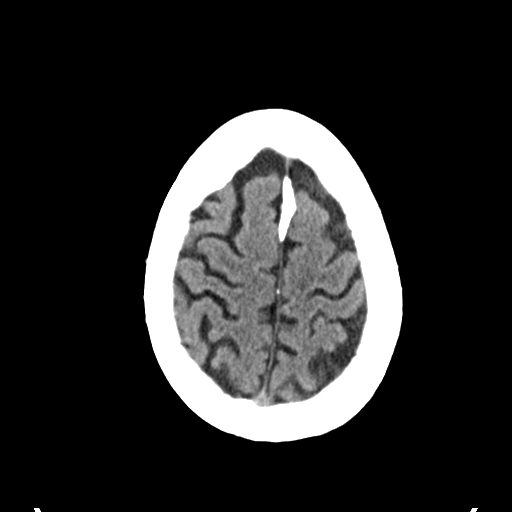
[im 26/35  brain]
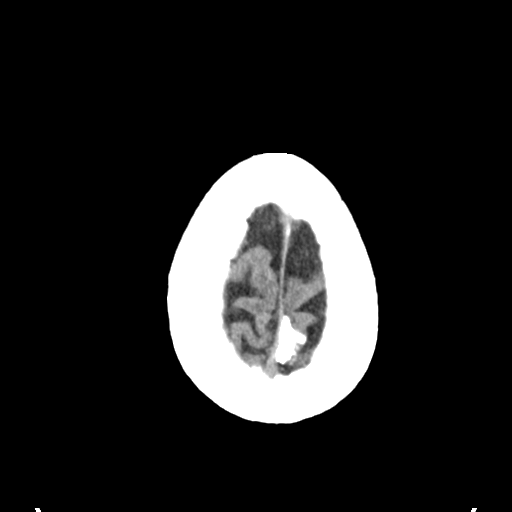
[im 29/35  brain]
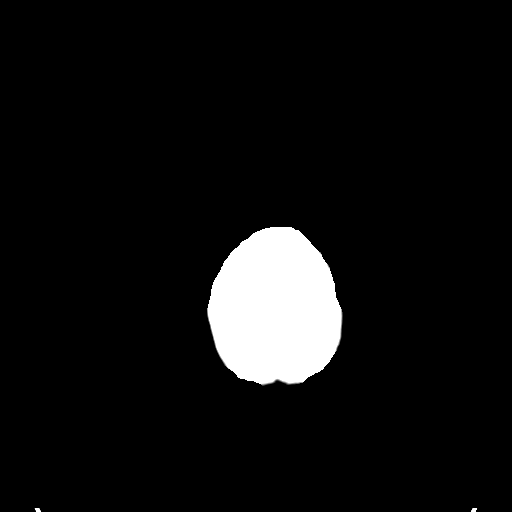
[im 29/35  bone]
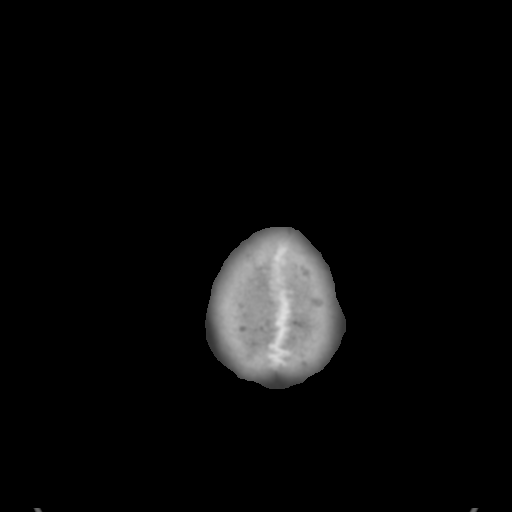
[im 32/35  brain]
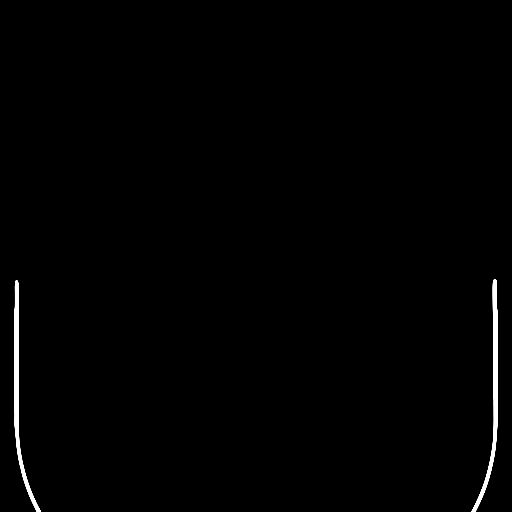

[Series 4: coronal soft tissue · coronal · 0.35mm/px · 3 of 70 slices shown]
[im 24/70  brain]
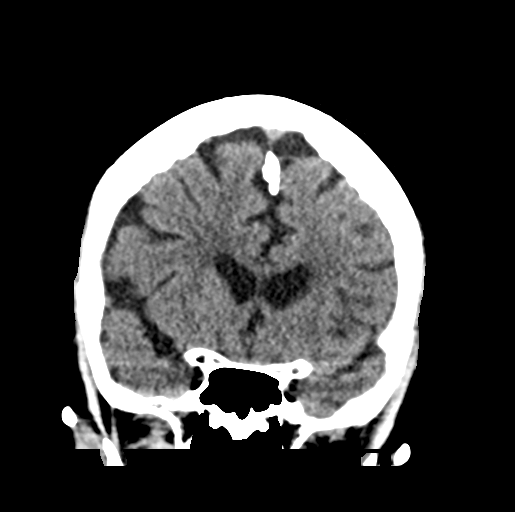
[im 31/70  brain]
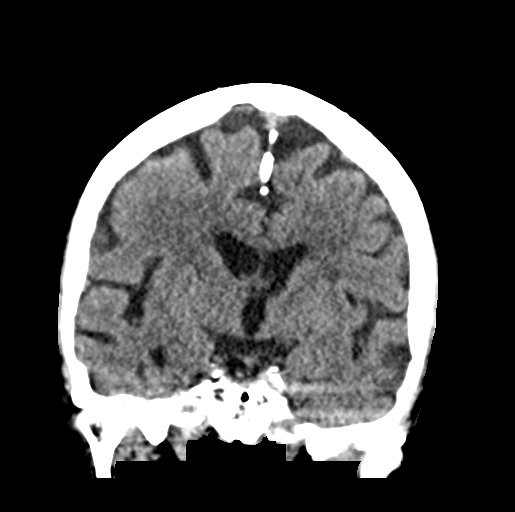
[im 39/70  brain]
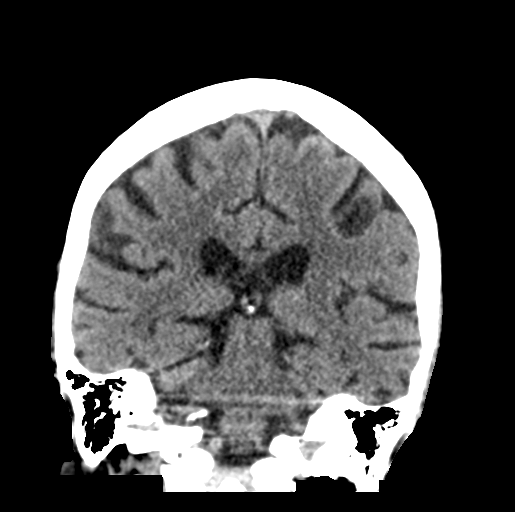

[Series 5: sagittal soft tissue · sagittal · 0.34mm/px · 3 of 56 slices shown]
[im 19/56  brain]
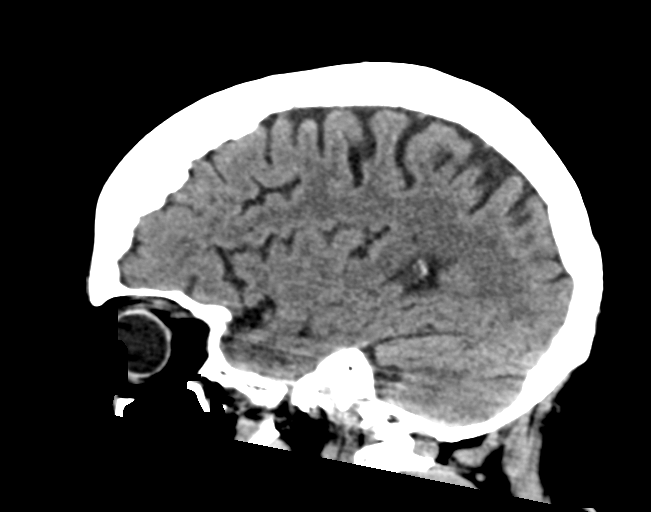
[im 28/56  brain]
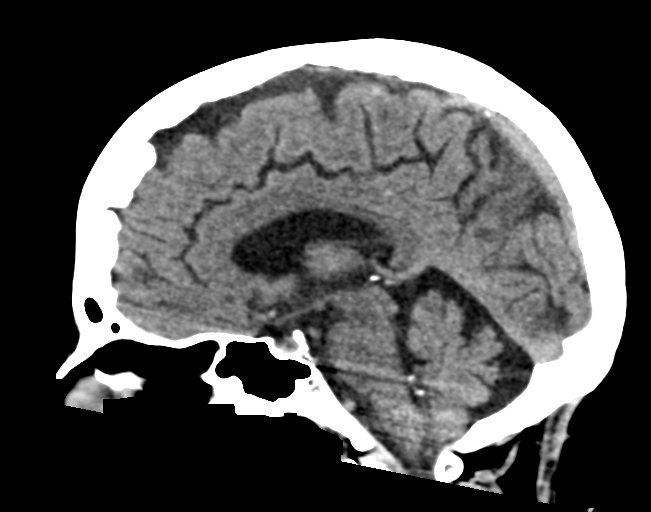
[im 37/56  brain]
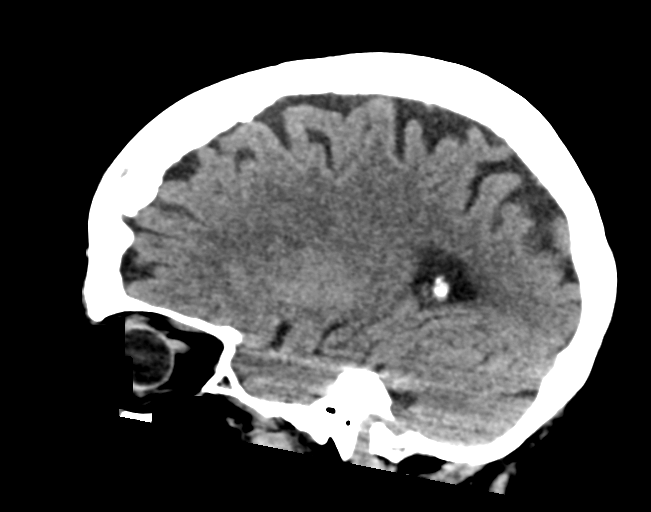

[16 of 47 positions shown; findings below may reference images not displayed]

FINDINGS: Brain: No evidence of acute infarction, hemorrhage, hydrocephalus,
extra-axial collection or mass lesion/mass effect.

Age related volume loss and mild chronic microvascular ischemic
change stable from the prior brain MRI.

Vascular: No hyperdense vessel or unexpected calcification.

Skull: Normal. Negative for fracture or focal lesion.

Sinuses/Orbits: Globes orbits are unremarkable. Visualized sinuses
and mastoid air cells are clear.

Other: None.
IMPRESSION: 1. No acute intracranial abnormalities.
2. Age related volume loss. Mild chronic microvascular ischemic
change.

## 2018-11-01 IMAGING — DX DG CHEST 2V
2 series · 2 of 2 positions shown · non-contrast
Comparison: Prior radiograph from 03/14/2016.

CLINICAL DATA: Initial evaluation for acute shortness of breath.
History of COPD.

EXAM:
CHEST  2 VIEW

[chest lat]
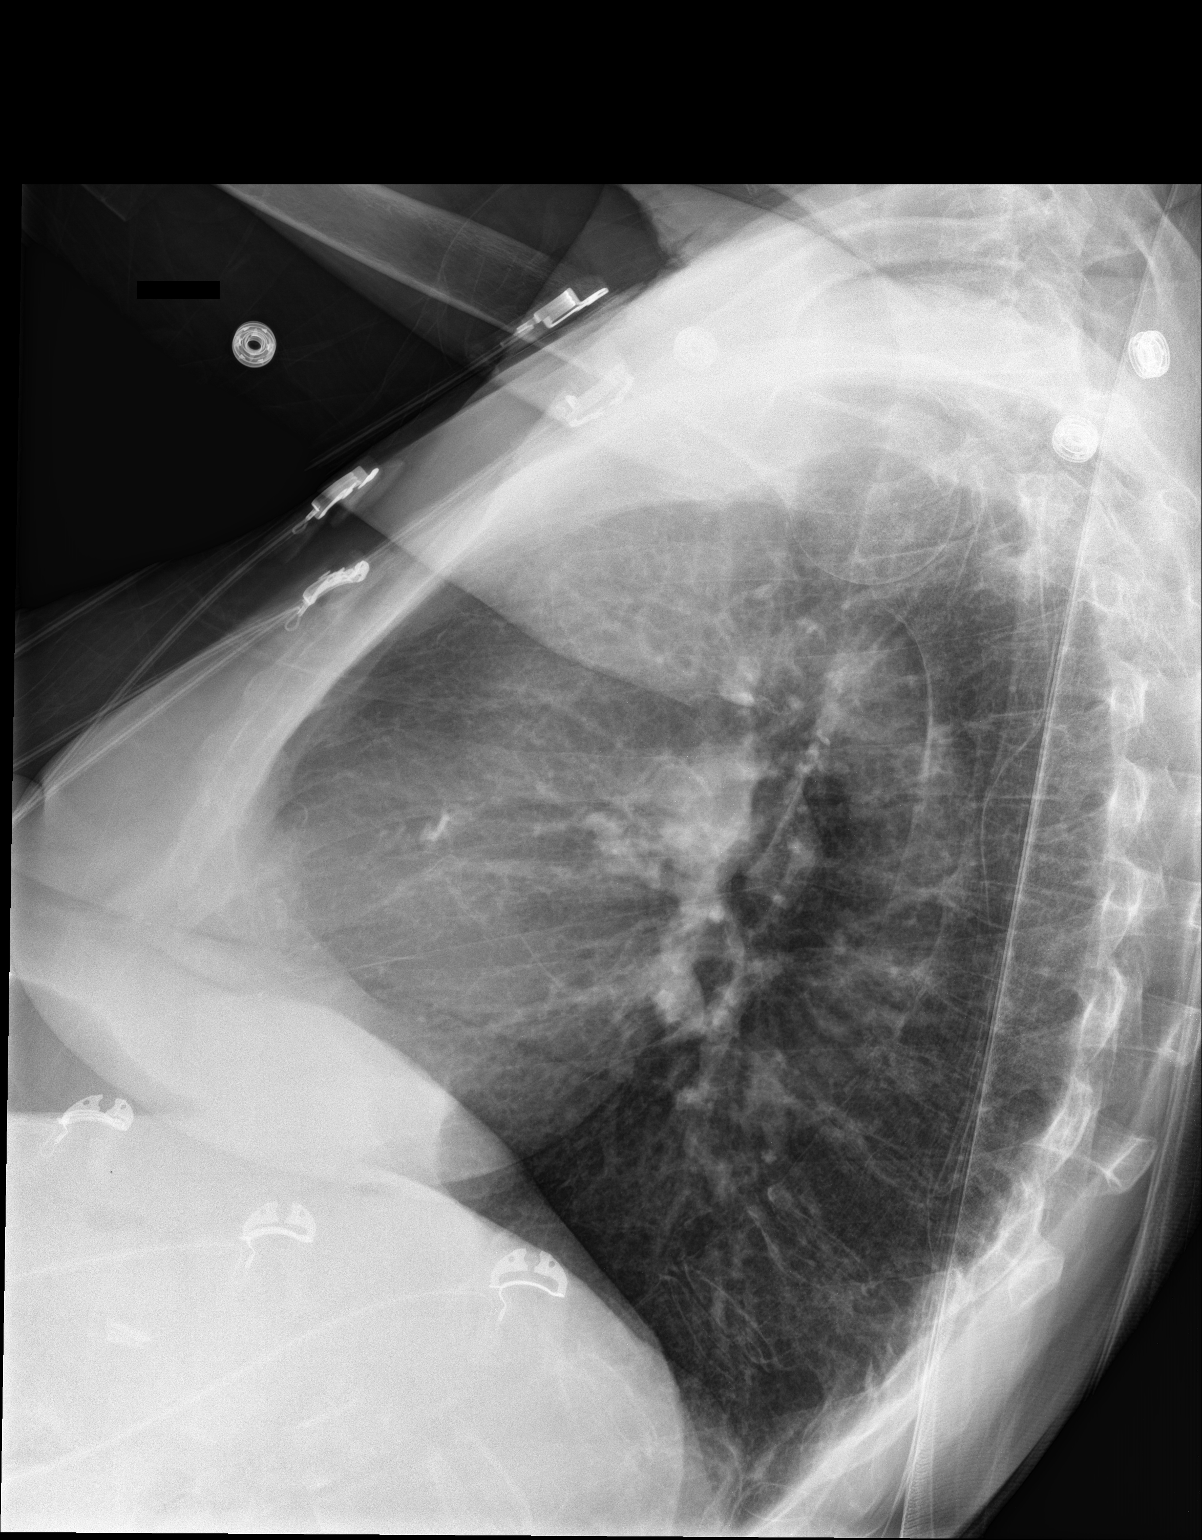

[chest ap]
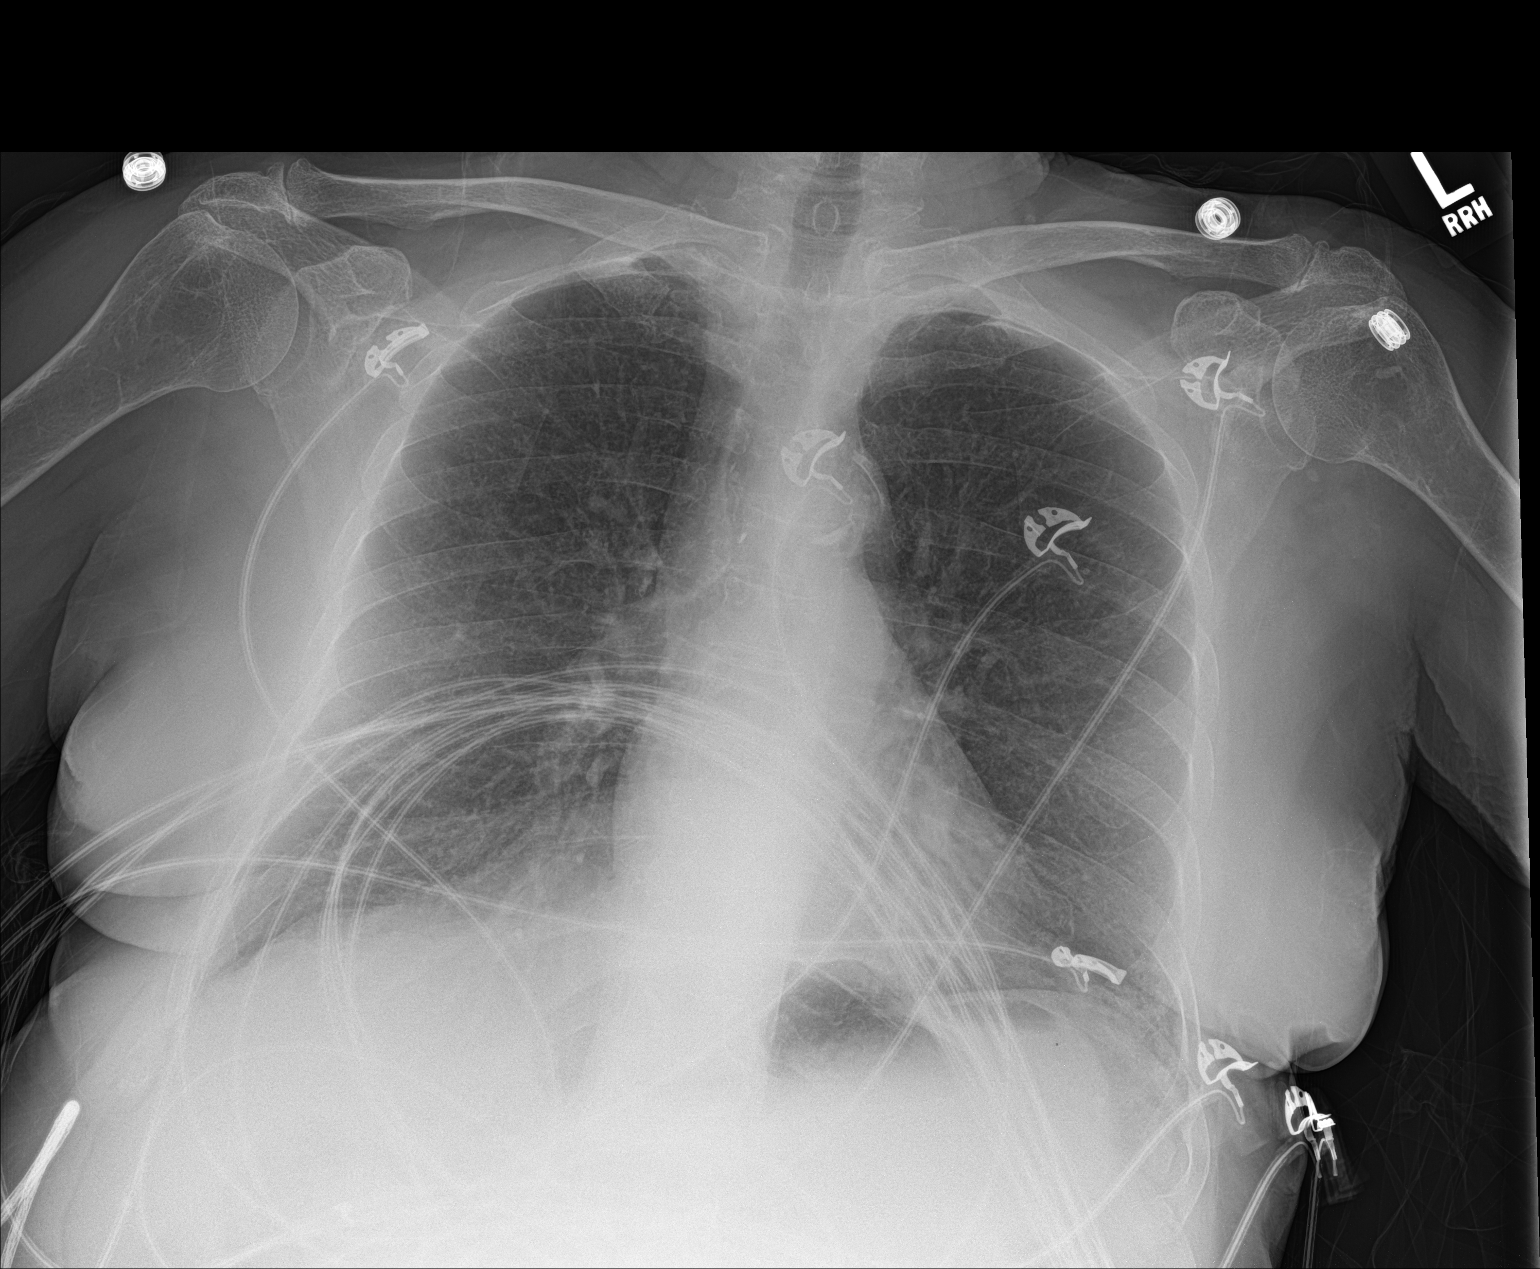

[2 of 2 positions shown; findings below may reference images not displayed]

FINDINGS: The cardiac and mediastinal silhouettes are stable in size and
contour, and remain within normal limits. Aortic atherosclerosis.

The lungs are mildly hyperinflated. Changes related COPD. No
airspace consolidation, pleural effusion, or pulmonary edema is
identified. There is no pneumothorax.

No acute osseous abnormality identified.  Osteopenia noted.
IMPRESSION: 1. COPD.  No other active cardiopulmonary disease.
2. Aortic atherosclerosis.

## 2021-08-02 NOTE — Telephone Encounter (Signed)
Formatting of this note might be different from the original.  No appt since 2021.   Electronically signed by Ladell Pier, NP at 08/02/2021  8:25 AM MDT

## 2021-09-20 NOTE — Telephone Encounter (Signed)
Formatting of this note might be different from the original.  called left vm  Electronically signed by Phebe Colla at 09/20/2021  9:41 AM EDT

## 2021-09-20 NOTE — Telephone Encounter (Signed)
Formatting of this note might be different from the original.  Error   Electronically signed by Phebe Colla at 09/20/2021  9:36 AM EDT

## 2021-09-20 NOTE — Other (Signed)
LVM to schedule PAT APPT

## 2021-09-21 NOTE — Telephone Encounter (Signed)
Formatting of this note might be different from the original.  called pt daughter verified dob she stated she will call back  Electronically signed by Phebe Colla at 09/21/2021 10:15 AM EDT

## 2021-09-22 NOTE — Other (Signed)
PAT : 9-7 @ 1;30(scheduled by Blondell Reveal)

## 2021-10-28 ENCOUNTER — Inpatient Hospital Stay: Primary: Family Medicine

## 2021-10-28 NOTE — Other (Signed)
SCHEDULED NEW PAT: 9-12 @ 1/30. PT NOTIFIED.(Per request by supervisor-Sigrun)

## 2021-11-02 ENCOUNTER — Inpatient Hospital Stay: Admit: 2021-11-02 | Payer: MEDICARE | Primary: Family Medicine

## 2021-11-02 DIAGNOSIS — Z01818 Encounter for other preprocedural examination: Secondary | ICD-10-CM

## 2021-11-02 LAB — CBC WITH AUTO DIFFERENTIAL
Absolute Immature Granulocyte: 0 10*3/uL (ref 0.00–0.04)
Basophils %: 1 % (ref 0–1)
Basophils Absolute: 0.1 10*3/uL (ref 0.0–0.1)
Eosinophils %: 5 % (ref 0–7)
Eosinophils Absolute: 0.3 10*3/uL (ref 0.0–0.4)
Hematocrit: 40.9 % (ref 35.0–47.0)
Hemoglobin: 12.4 g/dL (ref 11.5–16.0)
Immature Granulocytes: 0 % (ref 0.0–0.5)
Lymphocytes %: 15 % (ref 12–49)
Lymphocytes Absolute: 1 10*3/uL (ref 0.8–3.5)
MCH: 28.4 PG (ref 26.0–34.0)
MCHC: 30.3 g/dL (ref 30.0–36.5)
MCV: 93.8 FL (ref 80.0–99.0)
MPV: 10.1 FL (ref 8.9–12.9)
Monocytes %: 8 % (ref 5–13)
Monocytes Absolute: 0.6 10*3/uL (ref 0.0–1.0)
Neutrophils %: 71 % (ref 32–75)
Neutrophils Absolute: 5 10*3/uL (ref 1.8–8.0)
Nucleated RBCs: 0 PER 100 WBC
Platelets: 237 10*3/uL (ref 150–400)
RBC: 4.36 M/uL (ref 3.80–5.20)
RDW: 15.1 % — ABNORMAL HIGH (ref 11.5–14.5)
WBC: 7.1 10*3/uL (ref 3.6–11.0)
nRBC: 0 10*3/uL (ref 0.00–0.01)

## 2021-11-02 LAB — EKG 12-LEAD
Atrial Rate: 70 {beats}/min
Diagnosis: NORMAL
P Axis: 83 degrees
P-R Interval: 202 ms
Q-T Interval: 406 ms
QRS Duration: 88 ms
QTc Calculation (Bazett): 438 ms
R Axis: 0 degrees
T Axis: 27 degrees
Ventricular Rate: 70 {beats}/min

## 2021-11-02 LAB — BASIC METABOLIC PANEL
Anion Gap: 3 mmol/L — ABNORMAL LOW (ref 5–15)
BUN: 16 MG/DL (ref 6–20)
Bun/Cre Ratio: 13 (ref 12–20)
CO2: 31 mmol/L (ref 21–32)
Calcium: 9.1 MG/DL (ref 8.5–10.1)
Chloride: 104 mmol/L (ref 97–108)
Creatinine: 1.22 MG/DL — ABNORMAL HIGH (ref 0.55–1.02)
Est, Glom Filt Rate: 44 mL/min/{1.73_m2} — ABNORMAL LOW (ref >60)
Glucose: 88 mg/dL (ref 65–100)
Potassium: 4.1 mmol/L (ref 3.5–5.1)
Sodium: 138 mmol/L (ref 136–145)

## 2021-11-02 LAB — APTT: PTT: 41 s — ABNORMAL HIGH (ref 22.1–31.0)

## 2021-11-02 LAB — PROTIME-INR
INR: 1 (ref 0.9–1.1)
Protime: 10.9 s (ref 9.0–11.1)

## 2021-11-02 NOTE — Other (Signed)
JUDY FROM DR Mare Ferrari OFFICE CALLED BACK ,NOTIFIED WITH ABNORMAL PTT 41.0, NO ORDERS RECEIVED. ALL LABS  AND EKG HAVE BEEN FAXED TO OFFICE.

## 2021-11-02 NOTE — Other (Signed)
[facility]  PREOPERATIVE INSTRUCTIONS    Surgery Date:   11/09/2021    Your surgeon's office or [employer] staff will call you between 4 PM- 8 PM the day before surgery with your arrival time. If your surgery is on a Monday, you will receive a call the preceding Friday.     Please report to [facility] Patient Access/Admitting on the 1st floor.  Bring your insurance card, photo identification, and any copayment ( if applicable).   If you are going home the same day of your surgery, you must have a responsible adult to drive you home. You need to have a responsible adult to stay with you the first 24 hours after surgery and you should not drive a car for 24 hours following your surgery.  Nothing to eat or drink after midnight the night before surgery. This includes no water, gum, mints, coffee, juice, etc.  Please note special instructions, if applicable, below for medications.  Do NOT drink alcohol or smoke 24 hours before surgery. STOP smoking for 14 days prior as it helps with breathing and healing after surgery.  If you are being admitted to the hospital, please leave personal belongings/luggage in your car until you have an assigned hospital room number.  Please wear comfortable clothes. Wear your glasses instead of contacts. We ask that all money, jewelry and valuables be left at home. Wear no make up, particularly mascara, the day of surgery.    All body piercings, rings, and jewelry need to be removed and left at home. Please remove any nail polish or artificial nails from your fingernails. Please wear your hair loose or down. Please no pony-tails, buns, or any metal hair accessories. If you shower the morning of surgery, please do not apply any lotions or powders afterwards.  You may wear deodorant, unless having breast surgery. Do not shave any body area within 24 hours of your surgery.  Please follow all instructions to avoid any potential surgical cancellation.  Should your physical condition  change, (i.e. fever, cold, flu, etc.) please notify your surgeon as soon as possible.  It is important to be on time. If a situation occurs where you may be delayed, please call:  [phone] / [phone] on the day of surgery.  The Preadmission Testing staff can be reached at [phone].  Special instructions: BRING ADVANCE DIRECTIVE, IF COMPLETED      Current Outpatient Medications   Medication Sig    levothyroxine (SYNTHROID) 125 MCG tablet Take 1 tablet by mouth every morning    pantoprazole (PROTONIX) 20 MG tablet Take 1 tablet by mouth every morning    propranolol (INDERAL) 20 MG tablet Take 1 tablet by mouth 3 times daily    citalopram (CELEXA) 40 MG tablet Take 1 tablet by mouth Daily with lunch    buPROPion (WELLBUTRIN SR) 150 MG extended release tablet Take 1 tablet by mouth 2 times daily    losartan (COZAAR) 25 MG tablet Take 1 tablet by mouth Daily with lunch    HYDROcodone-acetaminophen (NORCO) 5-325 MG per tablet Take 0.5-1 tablets by mouth every 12 hours as needed for Pain. Max Daily Amount: 2 tablets    gabapentin (NEURONTIN) 600 MG tablet Take 1 tablet by mouth 3 times daily. Max Daily Amount: 1,800 mg    diclofenac (VOLTAREN) 75 MG EC tablet Take 1 tablet by mouth daily    calcitRIOL (ROCALTROL) 0.25 MCG capsule Take 1 capsule by mouth three times a week  cephALEXin (KEFLEX) 250 MG capsule Take 1 capsule by mouth Daily with lunch    lipase-protease-amylase (CREON) 36000-114000 units CPEP delayed release capsule Take 2 capsules by mouth 3 times daily (with meals) AND 1 CAPSULE WITH SNACKS, MAX 8/DAY    Lactobacillus (ACIDOPHILUS/BIFIDUS PO) Take 1 tablet by mouth daily    pyridoxine (B-6) 100 MG tablet Take 0.5 tablets by mouth 2 times daily    vitamin B-12 (CYANOCOBALAMIN) 500 MCG tablet Take 1 tablet by mouth four times a week    aspirin 325 MG tablet Take 1 tablet by mouth Daily with lunch    Calcium Citrate-Vitamin D (CALCIUM CITRATE+D3 PO) Take 630 mg by mouth in the morning and at  bedtime    VITAMIN D PO Take 1,000 Int'l Units/1.66m2 by mouth 2 times daily    Biotin 31517 MCG TABS Take 1 tablet by mouth daily    vitamin C (ASCORBIC ACID) 500 MG tablet Take 2 tablets by mouth 2 times daily    Multiple Vitamins-Minerals (PRESERVISION AREDS 2 PO) Take 1 tablet by mouth in the morning and at bedtime    D-Mannose 500 MG CAPS Take 2,000 mg by mouth in the morning and at bedtime    acetaminophen (TYLENOL) 325 MG tablet Take 2 tablets by mouth every 6 hours as needed for Pain    raNITIdine HCl (RANITIDINE 150 MAX STRENGTH PO) Take by mouth as needed    Alpha-D-Galactosidase (BEANO PO) Take by mouth as needed    SIMETHICONE PO Take by mouth as needed     No current facility-administered medications for this encounter.       YOU MUST ONLY TAKE THESE MEDICATIONS THE MORNING OF SURGERY WITH A SIP OF WATER: GABAPENTIN, BUPROPION, PROPRANOLOL, PANTOPRAZOLE,     MEDICATIONS TO TAKE THE MORNING OF SURGERY ONLY IF NEEDED: HYDROCODONE (AT LEAST 4 HRS PRIOR  TO SURGERY TIME), TYLENOL,     Stop all vitamins, herbal medicines and Aspirin containing products 7 days prior to surgery.     Stop any non-steroidal anti-inflammatory drugs (i.e. Ibuprofen, Naproxen, Advil, Aleve) 3 days before surgery. You may take Tylenol.        Preventing Infections Before and After - Your Surgery    IMPORTANT INSTRUCTIONS      You play an important role in your health and preparation for surgery. To reduce the germs on your skin you will need to shower with CHG soap (Chorhexidine gluconate 4%) two times before surgery.    CHG soap (Hibiclens, Hex-A-Clens or store brand)  CHG soap will be provided at your Preadmission Testing (PAT) appointment.  If you do not have a PAT appointment before surgery, you may arrange to pick up CHG soap from our office or purchase CHG soap at a pharmacy, grocery or department store.  You need to purchase TWO 4 ounce bottles to use for your 2 showers.    Steps to follow:  Wash your hair with your normal  shampoo and your body with regular soap and rinse well to remove shampoo and soap from your skin.  Wet a clean washcloth and turn off the shower.  Put CHG soap on washcloth and apply to your entire body from the neck down. Do not use on your head, face or private parts(genitals). Do not use CHG soap on open sores, wounds or areas of skin irritation.  Wash you body gently for 5 minutes. Do not wash your skin too hard. This soap does not create lather. Pay special attention to  your underarms and from your belly button to your feet.  Turn the shower back on and rinse well to get CHG soap off your body.  Pat your skin dry with a clean, dry towel. Do not apply lotions or moisturizer.  Put on clean clothes and sleep on fresh bed sheets and do not allow pets to sleep with you.    Shower with CHG soap 2 times before your surgery  The evening before your surgery  The morning of your surgery      Tips to help prevent infections after your surgery:  Protect your surgical wound from germs:  Hand washing is the most important thing you and your caregivers can do to prevent infections.  Keep your bandage clean and dry!  Do not touch your surgical wound.  Use clean, freshly washed towels and washcloths every time you shower; do not share bath linens with others.  Until your surgical wound is healed, wear clothing and sleep on bed linens each day that are clean and freshly washed.  Do not allow pets to sleep in your bed with you or touch your surgical wound.  Do not smoke - smoking delays wound healing. This may be a good time to stop smoking.  If you have diabetes, it is important for you to manage your blood sugar levels properly before your surgery as well as after your surgery. Poorly managed blood sugar levels slow down wound healing and prevent you from healing completely.          Patient Information Regarding COVID Restrictions    Day of Procedure    Please park in the parking deck or any designated visitor parking  lot.  Enter the facility through the Main Entrance of the hospital.  On the day of surgery, please provide the cell phone number of the person who will be waiting for you to the Patient Access representative at the time of registration.  Masks are highly recommended in the hospital, but not required.  Once your procedure and the immediate recovery period is completed, a nurse in the recovery area will contact your designated visitor to inform them of your room number or to otherwise review other pertinent information regarding your care.    Social distancing practices are strongly encouraged in waiting areas and the cafeteria.       The patient was contacted in person.   She verbalized understanding of all instructions does not need reinforcement.

## 2021-11-02 NOTE — Other (Signed)
CALLED [facility] OFF[facility] AND LEFT VOICEMAIL IN REGARDS TO ABNORMAL PTT 41.0.        ABNORMAL LABS FAXED TO PCP VIA Epic.

## 2021-11-09 ENCOUNTER — Inpatient Hospital Stay
Admit: 2021-11-09 | Discharge: 2021-11-10 | Disposition: A | Discharge status: Home / Self Care | Payer: Medicare Other | Attending: Plastic and Reconstructive Surgery | Admitting: Plastic and Reconstructive Surgery

## 2021-11-09 DIAGNOSIS — T8543XA Leakage of breast prosthesis and implant, initial encounter: Secondary | ICD-10-CM

## 2021-11-09 MED ORDER — MIDAZOLAM HCL (PF) 2 MG/2ML IJ SOLN
2 MG/ML | Freq: Once | INTRAMUSCULAR | Status: DC | PRN
Start: 2021-11-09 — End: 2021-11-09

## 2021-11-09 MED ORDER — SODIUM CHLORIDE 0.9 % IV SOLN
0.9 % | INTRAVENOUS | Status: DC | PRN
Start: 2021-11-09 — End: 2021-11-09

## 2021-11-09 MED ORDER — BENZOCAINE-MENTHOL 6-10 MG MT LOZG
6-10 | OROMUCOSAL | Status: DC | PRN
Start: 2021-11-09 — End: 2021-11-10

## 2021-11-09 MED ORDER — EPHEDRINE SULFATE (PRESSORS) 50 MG/ML IV SOLN
50 MG/ML | INTRAVENOUS | Status: AC
Start: 2021-11-09 — End: ?

## 2021-11-09 MED ORDER — CEFAZOLIN SODIUM 1 G IJ SOLR
1 g | Freq: Three times a day (TID) | INTRAMUSCULAR | Status: DC
Start: 2021-11-09 — End: 2021-11-10
  Administered 2021-11-10 (×2): 2000 mg via INTRAVENOUS

## 2021-11-09 MED ORDER — PROPRANOLOL HCL 20 MG PO TABS
20 MG | Freq: Three times a day (TID) | ORAL | Status: DC
Start: 2021-11-09 — End: 2021-11-10
  Administered 2021-11-10 (×2): 20 mg via ORAL

## 2021-11-09 MED ORDER — EPHEDRINE SULFATE (PRESSORS) 50 MG/ML IV SOLN
50 MG/ML | INTRAVENOUS | Status: DC | PRN
Start: 2021-11-09 — End: 2021-11-09
  Administered 2021-11-09 (×3): 5 via INTRAVENOUS

## 2021-11-09 MED ORDER — ROCURONIUM BROMIDE 50 MG/5ML IV SOLN
50 MG/5ML | INTRAVENOUS | Status: AC
Start: 2021-11-09 — End: ?

## 2021-11-09 MED ORDER — GLYCOPYRROLATE PF 0.4 MG/2ML IJ SOSY
0.4 MG/2ML | INTRAMUSCULAR | Status: AC
Start: 2021-11-09 — End: ?

## 2021-11-09 MED ORDER — SODIUM CHLORIDE 0.9 % IV SOLN
0.9 % | INTRAVENOUS | Status: DC | PRN
Start: 2021-11-09 — End: 2021-11-09
  Administered 2021-11-09: 17:00:00 20 via INTRAVENOUS

## 2021-11-09 MED ORDER — KCL-LACTATED RINGERS-D5W 20 MEQ/L IV SOLN
20 MEQ/L | INTRAVENOUS | Status: DC
Start: 2021-11-09 — End: 2021-11-10
  Administered 2021-11-09 – 2021-11-10 (×2): via INTRAVENOUS

## 2021-11-09 MED ORDER — BUPROPION HCL ER (SR) 150 MG PO TB12
150 MG | Freq: Two times a day (BID) | ORAL | Status: DC
Start: 2021-11-09 — End: 2021-11-10
  Administered 2021-11-10 (×2): 150 mg via ORAL

## 2021-11-09 MED ORDER — PANTOPRAZOLE SODIUM 20 MG PO TBEC
20 MG | Freq: Every day | ORAL | Status: DC
Start: 2021-11-09 — End: 2021-11-10
  Administered 2021-11-10: 11:00:00 20 mg via ORAL

## 2021-11-09 MED ORDER — PROPOFOL 200 MG/20ML IV EMUL
200 MG/20ML | INTRAVENOUS | Status: DC | PRN
Start: 2021-11-09 — End: 2021-11-09
  Administered 2021-11-09: 17:00:00 100 via INTRAVENOUS
  Administered 2021-11-09: 17:00:00 50 via INTRAVENOUS

## 2021-11-09 MED ORDER — LIDOCAINE HCL (PF) 2 % IJ SOLN
2 % | INTRAMUSCULAR | Status: DC | PRN
Start: 2021-11-09 — End: 2021-11-09
  Administered 2021-11-09: 17:00:00 80 via INTRAVENOUS

## 2021-11-09 MED ORDER — ONDANSETRON HCL 4 MG/2ML IJ SOLN
4 MG/2ML | INTRAMUSCULAR | Status: DC | PRN
Start: 2021-11-09 — End: 2021-11-09
  Administered 2021-11-09: 19:00:00 4 via INTRAVENOUS

## 2021-11-09 MED ORDER — SODIUM CHLORIDE (PF) 0.9 % IJ SOLN
0.9 % | INTRAMUSCULAR | Status: AC
Start: 2021-11-09 — End: ?

## 2021-11-09 MED ORDER — LOSARTAN POTASSIUM 25 MG PO TABS
25 MG | Freq: Every day | ORAL | Status: DC
Start: 2021-11-09 — End: 2021-11-10

## 2021-11-09 MED ORDER — FENTANYL CITRATE (PF) 100 MCG/2ML IJ SOLN
100 MCG/2ML | INTRAMUSCULAR | Status: AC
Start: 2021-11-09 — End: ?

## 2021-11-09 MED ORDER — DEXAMETHASONE SODIUM PHOSPHATE 4 MG/ML IJ SOLN
4 MG/ML | INTRAMUSCULAR | Status: AC
Start: 2021-11-09 — End: ?

## 2021-11-09 MED ORDER — ONDANSETRON HCL 4 MG/2ML IJ SOLN
4 MG/2ML | Freq: Four times a day (QID) | INTRAMUSCULAR | Status: DC | PRN
Start: 2021-11-09 — End: 2021-11-10

## 2021-11-09 MED ORDER — NEOSTIGMINE METHYLSULFATE 3 MG/3ML IV SOSY
3 MG/ML | INTRAVENOUS | Status: AC
Start: 2021-11-09 — End: ?

## 2021-11-09 MED ORDER — STERILE WATER FOR INJECTION (MIXTURES ONLY)
1 g | INTRAMUSCULAR | Status: AC
Start: 2021-11-09 — End: 2021-11-09
  Administered 2021-11-09: 17:00:00 2000 mg via INTRAVENOUS

## 2021-11-09 MED ORDER — PROCHLORPERAZINE EDISYLATE 10 MG/2ML IJ SOLN
10 MG/2ML | Freq: Once | INTRAMUSCULAR | Status: DC | PRN
Start: 2021-11-09 — End: 2021-11-09

## 2021-11-09 MED ORDER — FENTANYL CITRATE (PF) 100 MCG/2ML IJ SOLN
100 MCG/2ML | Freq: Once | INTRAMUSCULAR | Status: DC | PRN
Start: 2021-11-09 — End: 2021-11-09

## 2021-11-09 MED ORDER — LIDOCAINE HCL (PF) 2 % IJ SOLN
2 % | INTRAMUSCULAR | Status: AC
Start: 2021-11-09 — End: ?

## 2021-11-09 MED ORDER — CEFAZOLIN SODIUM 1 G IJ SOLR
1 g | INTRAMUSCULAR | Status: AC
Start: 2021-11-09 — End: ?

## 2021-11-09 MED ORDER — OXYCODONE HCL 5 MG PO TABS
5 MG | Freq: Once | ORAL | Status: DC | PRN
Start: 2021-11-09 — End: 2021-11-09

## 2021-11-09 MED ORDER — GABAPENTIN 300 MG PO CAPS
300 MG | Freq: Three times a day (TID) | ORAL | Status: DC
Start: 2021-11-09 — End: 2021-11-10
  Administered 2021-11-10 (×2): 600 mg via ORAL

## 2021-11-09 MED ORDER — LIDOCAINE-EPINEPHRINE 1 %-1:100000 IJ SOLN
1 %-:00000 | INTRAMUSCULAR | Status: AC
Start: 2021-11-09 — End: ?

## 2021-11-09 MED ORDER — SUCCINYLCHOLINE CHLORIDE 200 MG/10ML IV SOSY
200 MG/10ML | INTRAVENOUS | Status: AC
Start: 2021-11-09 — End: ?

## 2021-11-09 MED ORDER — THERAPEUTIC MULTIVIT/MINERAL PO TABS
Freq: Every day | ORAL | Status: DC
Start: 2021-11-09 — End: 2021-11-10
  Administered 2021-11-10: 12:00:00 1 via ORAL

## 2021-11-09 MED ORDER — HYDRALAZINE HCL 20 MG/ML IJ SOLN
20 MG/ML | INTRAMUSCULAR | Status: DC | PRN
Start: 2021-11-09 — End: 2021-11-09

## 2021-11-09 MED ORDER — HYDROMORPHONE HCL PF 1 MG/ML IJ SOLN
1 MG/ML | INTRAMUSCULAR | Status: DC | PRN
Start: 2021-11-09 — End: 2021-11-09

## 2021-11-09 MED ORDER — OCUVITE-LUTEIN PO TABS
Freq: Two times a day (BID) | ORAL | Status: DC
Start: 2021-11-09 — End: 2021-11-09

## 2021-11-09 MED ORDER — LIDOCAINE HCL (PF) 1 % IJ SOLN
1 % | Freq: Once | INTRAMUSCULAR | Status: DC | PRN
Start: 2021-11-09 — End: 2021-11-09

## 2021-11-09 MED ORDER — LACTATED RINGERS IV SOLN
INTRAVENOUS | Status: DC
Start: 2021-11-09 — End: 2021-11-09
  Administered 2021-11-09: 16:00:00 via INTRAVENOUS

## 2021-11-09 MED ORDER — NORMAL SALINE FLUSH 0.9 % IV SOLN
0.9 % | INTRAVENOUS | Status: DC | PRN
Start: 2021-11-09 — End: 2021-11-09

## 2021-11-09 MED ORDER — NORMAL SALINE FLUSH 0.9 % IV SOLN
0.9 % | Freq: Two times a day (BID) | INTRAVENOUS | Status: DC
Start: 2021-11-09 — End: 2021-11-09

## 2021-11-09 MED ORDER — METHYLENE BLUE (ANTIDOTE) 50 MG/10ML IV SOLN
50 MG/10ML | INTRAVENOUS | Status: AC
Start: 2021-11-09 — End: ?

## 2021-11-09 MED ORDER — BUPIVACAINE LIPOSOME 1.3 % IJ SUSP
1.3 % | INTRAMUSCULAR | Status: AC
Start: 2021-11-09 — End: ?

## 2021-11-09 MED ORDER — GLYCOPYRROLATE PF 0.4 MG/2ML IJ SOSY
0.4 MG/2ML | INTRAMUSCULAR | Status: DC | PRN
Start: 2021-11-09 — End: 2021-11-09
  Administered 2021-11-09: 18:00:00 .2 via INTRAVENOUS
  Administered 2021-11-09: 19:00:00 .4 via INTRAVENOUS

## 2021-11-09 MED ORDER — PROPOFOL 200 MG/20ML IV EMUL
200 MG/20ML | INTRAVENOUS | Status: AC
Start: 2021-11-09 — End: ?

## 2021-11-09 MED ORDER — DEXAMETHASONE SODIUM PHOSPHATE 4 MG/ML IJ SOLN
4 MG/ML | INTRAMUSCULAR | Status: DC | PRN
Start: 2021-11-09 — End: 2021-11-09
  Administered 2021-11-09: 17:00:00 4 via INTRAVENOUS

## 2021-11-09 MED ORDER — ONDANSETRON HCL 4 MG/2ML IJ SOLN
4 MG/2ML | INTRAMUSCULAR | Status: AC
Start: 2021-11-09 — End: ?

## 2021-11-09 MED ORDER — NEOSTIGMINE METHYLSULFATE 3 MG/3ML IV SOSY
3 MG/ML | INTRAVENOUS | Status: DC | PRN
Start: 2021-11-09 — End: 2021-11-09
  Administered 2021-11-09: 19:00:00 3 via INTRAVENOUS

## 2021-11-09 MED ORDER — ACETAMINOPHEN 325 MG PO TABS
325 MG | Freq: Four times a day (QID) | ORAL | Status: DC | PRN
Start: 2021-11-09 — End: 2021-11-10
  Administered 2021-11-10: 01:00:00 650 mg via ORAL

## 2021-11-09 MED ORDER — PANCRELIPASE (LIP-PROT-AMYL) 15000-47000-63,000 UNITS PO CPEP
15000-47000 units | Freq: Three times a day (TID) | ORAL | Status: DC
Start: 2021-11-09 — End: 2021-11-10
  Administered 2021-11-09: 23:00:00 15000 [IU] via ORAL
  Administered 2021-11-10 (×2): 30000 [IU] via ORAL

## 2021-11-09 MED ORDER — PHENYLEPHRINE HCL (PRESSORS) 0.4 MG/10ML IV SOSY
0.4 MG/10ML | INTRAVENOUS | Status: AC
Start: 2021-11-09 — End: ?

## 2021-11-09 MED ORDER — HYDROCODONE-ACETAMINOPHEN 5-325 MG PO TABS
5-325 MG | Freq: Two times a day (BID) | ORAL | Status: DC | PRN
Start: 2021-11-09 — End: 2021-11-10
  Administered 2021-11-09 – 2021-11-10 (×2): 1 via ORAL

## 2021-11-09 MED ORDER — GABAPENTIN 600 MG PO TABS
600 MG | Freq: Three times a day (TID) | ORAL | Status: DC
Start: 2021-11-09 — End: 2021-11-09

## 2021-11-09 MED ORDER — SODIUM CHLORIDE 0.9 % IR SOLN
0.9 % | Status: DC | PRN
Start: 2021-11-09 — End: 2021-11-09
  Administered 2021-11-09: 18:00:00 400

## 2021-11-09 MED ORDER — SODIUM CHLORIDE 0.9 % IV SOLN
0.9 % | INTRAVENOUS | Status: DC | PRN
Start: 2021-11-09 — End: 2021-11-09
  Administered 2021-11-09: 18:00:00 50 via SUBCUTANEOUS

## 2021-11-09 MED ORDER — FENTANYL CITRATE (PF) 100 MCG/2ML IJ SOLN
100 MCG/2ML | INTRAMUSCULAR | Status: DC | PRN
Start: 2021-11-09 — End: 2021-11-09
  Administered 2021-11-09 (×2): 25 via INTRAVENOUS
  Administered 2021-11-09: 17:00:00 50 via INTRAVENOUS

## 2021-11-09 MED ORDER — ACETAMINOPHEN 500 MG PO TABS
500 MG | Freq: Once | ORAL | Status: AC
Start: 2021-11-09 — End: 2021-11-09
  Administered 2021-11-09: 16:00:00 1000 mg via ORAL

## 2021-11-09 MED ORDER — GENTAMICIN IN SALINE 0.8 MG/ML IV SOLN
0.8 MG/ML | Freq: Once | INTRAVENOUS | Status: DC
Start: 2021-11-09 — End: 2021-11-10

## 2021-11-09 MED ORDER — ROCURONIUM BROMIDE 50 MG/5ML IV SOLN
50 MG/5ML | INTRAVENOUS | Status: DC | PRN
Start: 2021-11-09 — End: 2021-11-09
  Administered 2021-11-09: 17:00:00 25 via INTRAVENOUS
  Administered 2021-11-09: 17:00:00 5 via INTRAVENOUS

## 2021-11-09 MED ORDER — CITALOPRAM HYDROBROMIDE 20 MG PO TABS
20 MG | Freq: Every day | ORAL | Status: DC
Start: 2021-11-09 — End: 2021-11-10
  Administered 2021-11-10: 17:00:00 40 mg via ORAL

## 2021-11-09 MED ORDER — BUPIVACAINE HCL (PF) 0.5 % IJ SOLN
0.5 % | INTRAMUSCULAR | Status: AC
Start: 2021-11-09 — End: ?

## 2021-11-09 MED ORDER — ONDANSETRON HCL 4 MG/2ML IJ SOLN
4 MG/2ML | Freq: Once | INTRAMUSCULAR | Status: DC | PRN
Start: 2021-11-09 — End: 2021-11-09

## 2021-11-09 MED ORDER — SUCCINYLCHOLINE CHLORIDE 200 MG/10ML IV SOSY
200 MG/10ML | INTRAVENOUS | Status: DC | PRN
Start: 2021-11-09 — End: 2021-11-09
  Administered 2021-11-09: 17:00:00 100 via INTRAVENOUS

## 2021-11-09 MED ORDER — FENTANYL CITRATE (PF) 100 MCG/2ML IJ SOLN
100 MCG/2ML | INTRAMUSCULAR | Status: DC | PRN
Start: 2021-11-09 — End: 2021-11-09

## 2021-11-09 MED ORDER — CEFAZOLIN SODIUM 1 G IJ SOLR
1 g | INTRAMUSCULAR | Status: DC | PRN
Start: 2021-11-09 — End: 2021-11-09
  Administered 2021-11-09: 18:00:00 400

## 2021-11-09 MED FILL — XYLOCAINE/EPINEPHRINE 1 %-1:100000 IJ SOLN: 1 %-:00000 | INTRAMUSCULAR | Qty: 20

## 2021-11-09 MED FILL — SODIUM CHLORIDE (PF) 0.9 % IJ SOLN: 0.9 % | INTRAMUSCULAR | Qty: 10

## 2021-11-09 MED FILL — SUCCINYLCHOLINE CHLORIDE 200 MG/10ML IV SOSY: 200 MG/10ML | INTRAVENOUS | Qty: 10

## 2021-11-09 MED FILL — THERAPEUTIC-M/LUTEIN PO TABS: ORAL | Qty: 1

## 2021-11-09 MED FILL — EXPAREL 1.3 % IJ SUSP: 1.3 % | INTRAMUSCULAR | Qty: 20

## 2021-11-09 MED FILL — GENTAMICIN IN SALINE 0.8-0.9 MG/ML-% IV SOLN: INTRAVENOUS | Qty: 100

## 2021-11-09 MED FILL — THERA M PLUS PO TABS: ORAL | Qty: 1

## 2021-11-09 MED FILL — KCL-LACTATED RINGERS-D5W 20 MEQ/L IV SOLN: 20 MEQ/L | INTRAVENOUS | Qty: 1000

## 2021-11-09 MED FILL — CEFAZOLIN SODIUM 1 G IJ SOLR: 1 g | INTRAMUSCULAR | Qty: 1000

## 2021-11-09 MED FILL — NORMAL SALINE FLUSH 0.9 % IV SOLN: 0.9 % | INTRAVENOUS | Qty: 40

## 2021-11-09 MED FILL — GABAPENTIN 600 MG PO TABS: 600 MG | ORAL | Qty: 1

## 2021-11-09 MED FILL — ACETAMINOPHEN EXTRA STRENGTH 500 MG PO TABS: 500 MG | ORAL | Qty: 2

## 2021-11-09 MED FILL — GLYCOPYRROLATE PF 0.4 MG/2ML IJ SOSY: 0.4 MG/2ML | INTRAMUSCULAR | Qty: 2

## 2021-11-09 MED FILL — NEOSTIGMINE METHYLSULFATE 3 MG/3ML IV SOSY: 3 MG/ML | INTRAVENOUS | Qty: 3

## 2021-11-09 MED FILL — DIPRIVAN 200 MG/20ML IV EMUL: 200 MG/20ML | INTRAVENOUS | Qty: 20

## 2021-11-09 MED FILL — LIDOCAINE HCL (PF) 2 % IJ SOLN: 2 % | INTRAMUSCULAR | Qty: 5

## 2021-11-09 MED FILL — PHENYLEPHRINE HCL (PRESSORS) 0.4 MG/10ML IV SOSY: 0.4 MG/10ML | INTRAVENOUS | Qty: 10

## 2021-11-09 MED FILL — FENTANYL CITRATE (PF) 100 MCG/2ML IJ SOLN: 100 MCG/2ML | INTRAMUSCULAR | Qty: 2

## 2021-11-09 MED FILL — EPHEDRINE SULFATE (PRESSORS) 50 MG/ML IV SOLN: 50 MG/ML | INTRAVENOUS | Qty: 1

## 2021-11-09 MED FILL — LACTATED RINGERS IV SOLN: INTRAVENOUS | Qty: 1000

## 2021-11-09 MED FILL — ROCURONIUM BROMIDE 50 MG/5ML IV SOLN: 50 MG/5ML | INTRAVENOUS | Qty: 5

## 2021-11-09 MED FILL — HYDROCODONE-ACETAMINOPHEN 5-325 MG PO TABS: 5-325 MG | ORAL | Qty: 1

## 2021-11-09 MED FILL — PROVAYBLUE 50 MG/10ML IV SOLN: 50 MG/10ML | INTRAVENOUS | Qty: 10

## 2021-11-09 MED FILL — ONDANSETRON HCL 4 MG/2ML IJ SOLN: 4 MG/2ML | INTRAMUSCULAR | Qty: 2

## 2021-11-09 MED FILL — ZENPEP 15000-47000 UNITS PO CPEP: 15000-47000 units | ORAL | Qty: 2

## 2021-11-09 MED FILL — SENSORCAINE-MPF 0.5 % IJ SOLN: 0.5 % | INTRAMUSCULAR | Qty: 30

## 2021-11-09 MED FILL — CEFAZOLIN SODIUM 1 G IJ SOLR: 1 g | INTRAMUSCULAR | Qty: 2000

## 2021-11-09 MED FILL — DEXAMETHASONE SODIUM PHOSPHATE 4 MG/ML IJ SOLN: 4 MG/ML | INTRAMUSCULAR | Qty: 1

## 2021-11-09 NOTE — Op Note (Signed)
[place] [facility]  OPERATIVE REPORT    Name:  Amy Galloway, Amy Galloway  MR#:  [id number]  DOB:  [birth year]  ACCOUNT #:  [id number]  DATE OF SERVICE:  11/09/2021    PREOPERATIVE DIAGNOSIS:  Leaking silicone gel breast implant with Baker grade IV capsular contracture after previous mastectomy and breast reconstruction with latissimus and breast implant for breast cancer done 39 years ago.    POSTOPERATIVE DIAGNOSIS:  Leaking silicone gel breast implant with Baker grade IV capsular contracture after previous mastectomy and breast reconstruction with latissimus and breast implant for breast cancer done 39 years ago.    PROCEDURES PERFORMED:  1.  Removal of leaking silicone gel breast implant.  2.  Complete capsulectomy left breast.  3.  Delayed reconstruction left breast with 680 mL Mentor moderate profile smooth round silicone gel breast implant.  4.  Revision of reconstructed left breast with excision of mastectomy scar and advancement flap closure of breast wound.    SURGEON:  Lindwood Qua, MD    ASSISTANT:  Antionette Poles assistant.    ANESTHESIA:  General.    COMPLICATIONS:  none.    SPECIMENS REMOVED:  left breast capsule.    IMPLANTS:  see op note.    ESTIMATED BLOOD LOSS:  Approximately 50 mL.    INDICATIONS:  The patient was brought to the operating room today for surgical treatment of a leaking silicone gel breast implant and Baker IV capsular contracture of the left breast.  She had undergone a left mastectomy in 1983.  In 1984, she had undergone delayed breast reconstruction with latissimus myocutaneous flap and a silicone gel breast implant.  She had developed a painful hard breast that on ultrasound showed leaking of her gel implant.  She was brought to the operating room today for removal of her leaking gel implant, complete capsulectomy, and reconstruction with a new silicone gel implant.  Preoperatively, she had been marked for surgery and the details of the planned procedure were discussed with her  including the scars which would result, the risk involved, the potential complications, the postoperative recovery, the expected results, and the healing process.  She appeared to understand all of these considerations.    FINDINGS AND PROCEDURE:  The patient was brought to the operating room and general anesthesia was induced.  Local anesthesia was induced in the planned incision at the superior aspect of the latissimus skin island and around the left breast with the usual mixture of lidocaine, Marcaine, and epinephrine.  Chest was then prepped and draped into a sterile field.  I began the procedure by making an incision at the junction of the superior aspect of the latissimus skin island and the patient's breast skin.  This was carried through the subcutaneous tissue down to the pectoralis major muscle.  Using the cautery, the muscle was divided down to the capsule.  The capsule was densely calcified.  Using the cautery and scissors, I began to dissect around the capsule and was able to free it from the surrounding tissue superiorly, medially, laterally, and inferiorly down to the chest wall.  Medially, I then entered the pocket and gel immediately spilled into the wound.  When this happened, I opened the capsule and removed all of the gel and the breast implant material.  I then completed the complete capsulectomy.  All removed capsular tissue was sent for permanent pathologic examination.  There did not appear to be any fluid within the capsule separate from the gel.  After  this was done, new gloves and instruments were used that had not been contaminated with the patient's silicone gel leakage.  Copious irrigation of the pocket was carried out removing all silicone.  I dissected under the pectoralis major muscle for a short distance to increase the superior size of the pocket.  A careful check for hemostasis was then done.  Preplaced sutures of 3-0 Vicryl were then placed in the muscle.  A single 15 round  Jackson-Pratt drain was left in the breast pocket space.  After another check for hemostasis, I then sized the pocket and it was my judgment that a 680 mL Mentor moderate profile breast implant was the best option for the patient to give her symmetry with the other side.  Therefore, one of these was opened and soaked in antibiotic irrigation.  After another careful check for hemostasis, the implant was carefully placed into the pocket not touching the skin with the implant.  The preplaced 3-0 Vicryl sutures were then tied in the muscle.  The patient was sat up on the operating room table and it was judged that the implant reconstruction looked symmetric with the other side.  The patient's mastectomy scar at the junction of the latissimus flap and the normal mastectomy skin was then excised to smooth the skin closure for the reconstruction.  A careful check for hemostasis was then done.  Final skin closure was with interrupted 3-0 Vicryl in the deep dermis with buried knots and running subcuticular 3-0 Monocryl on the skin.  A sterile dressing was applied and the patient was placed into a surgical bra.  She woke up from the anesthetic and went to the recovery room in good condition.  Final sponge and needle counts were reported to be correct.      Coutney Wildermuth Milbert Coulter III, MD      [id number]  D:  11/09/2021 15:34  T:  11/09/2021 18:22  JOB #:  [id number]

## 2021-11-09 NOTE — Anesthesia Post-Procedure Evaluation (Signed)
Department of Anesthesiology  Postprocedure Note    Patient: Amy Galloway  MRN: [id number]  Birthdate: 1935/02/26  Date of evaluation: 11/09/2021      Procedure Summary     Date: 11/09/21 Room / Location: [facility] OR 15 / [place] MA[facility] OR    Anesthesia Start: 1245 Anesthesia Stop: 1509    Procedure: REVISION OF RECONSTRUCTED BREAST WITH REMOVAL OF LEAKING GEL IMPLANT, CAPSULECTOMY AND RECONSTRUCTION LEFT BREAST WITH NEW GEL IMPLANT (Breast) Diagnosis:       Deformity of reconstructed breast      Capsular contracture of breast implant, initial encounter      Leakage of breast implant, initial encounter      Personal history of malignant neoplasm of breast      Status post left mastectomy      Mastodynia      (Deformity of reconstructed breast [N65.0])      (Capsular contracture of breast implant, initial encounter [T85.44XA])      (Leakage of breast implant, initial encounter [T85.43XA])      (Personal history of malignant neoplasm of breast [Z85.3])      (Status post left mastectomy [Z90.12])      (Mastodynia [N64.4])    Providers: Verlene Mayer, MD Responsible Provider: Jari Pigg, MD    Anesthesia Type: General ASA Status: 2          Anesthesia Type: General    Aldrete Phase I: Aldrete Score: 10    Aldrete Phase II:        Anesthesia Post Evaluation    Patient location during evaluation: PACU  Level of consciousness: awake  Airway patency: patent  Nausea & Vomiting: no nausea  Complications: no  Cardiovascular status: blood pressure returned to baseline  Respiratory status: acceptable  Hydration status: euvolemic  Comments: ---------------------------               11/09/21                      1620         ---------------------------   BP:        (!) 149/75       Pulse:         71           Resp:          16           Temp:   97.5 F (36.4 C)   SpO2:          96%         ---------------------------

## 2021-11-09 NOTE — Anesthesia Pre-Procedure Evaluation (Addendum)
Department of Anesthesiology  Preprocedure Note       Name:  Amy Galloway   Age:  86 y.o.  DOB:  05/09/1935                                          MRN:  [id number]         Date:  11/09/2021      Surgeon: Moishe SpiceSurgeon(s):  Jetta LoutIsaac L Wornom III, MD    Procedure: Procedure(s):  REVISION OF RECONSTRUCTED BREAST WITH REMOVAL OF LEAKING GEL IMPLANT, CAPSULECTOMY AND RECONSTRUCTION LEFT BREAST WITH NEW GEL IMPLANT    Medications prior to admission:   Prior to Admission medications    Medication Sig Start Date End Date Taking? Authorizing Provider   levothyroxine (SYNTHROID) 125 MCG tablet Take 1 tablet by mouth every morning    Historical Provider, MD   pantoprazole (PROTONIX) 20 MG tablet Take 1 tablet by mouth every morning    Historical Provider, MD   propranolol (INDERAL) 20 MG tablet Take 1 tablet by mouth 3 times daily    Historical Provider, MD   citalopram (CELEXA) 40 MG tablet Take 1 tablet by mouth Daily with lunch    Historical Provider, MD   buPROPion (WELLBUTRIN SR) 150 MG extended release tablet Take 1 tablet by mouth 2 times daily    Historical Provider, MD   losartan (COZAAR) 25 MG tablet Take 1 tablet by mouth Daily with lunch    Historical Provider, MD   HYDROcodone-acetaminophen (NORCO) 5-325 MG per tablet Take 0.5-1 tablets by mouth every 12 hours as needed for Pain. Max Daily Amount: 2 tablets    Historical Provider, MD   gabapentin (NEURONTIN) 600 MG tablet Take 1 tablet by mouth 3 times daily. Max Daily Amount: 1,800 mg    Historical Provider, MD   diclofenac (VOLTAREN) 75 MG EC tablet Take 1 tablet by mouth daily    Historical Provider, MD   calcitRIOL (ROCALTROL) 0.25 MCG capsule Take 1 capsule by mouth three times a week    Historical Provider, MD   cephALEXin (KEFLEX) 250 MG capsule Take 1 capsule by mouth Daily with lunch    Historical Provider, MD   lipase-protease-amylase (CREON) 36000-114000 units CPEP delayed release capsule Take 2 capsules by mouth 3 times daily (with meals) AND 1 CAPSULE WITH  SNACKS, MAX 8/DAY    Historical Provider, MD   Lactobacillus (ACIDOPHILUS/BIFIDUS PO) Take 1 tablet by mouth daily    Historical Provider, MD   pyridoxine (B-6) 100 MG tablet Take 0.5 tablets by mouth 2 times daily    Historical Provider, MD   vitamin B-12 (CYANOCOBALAMIN) 500 MCG tablet Take 1 tablet by mouth four times a week    Historical Provider, MD   aspirin 325 MG tablet Take 1 tablet by mouth Daily with lunch    Historical Provider, MD   Calcium Citrate-Vitamin D (CALCIUM CITRATE+D3 PO) Take 630 mg by mouth in the morning and at bedtime    Historical Provider, MD   VITAMIN D PO Take 1,000 Int'l Units/1.597m2 by mouth 2 times daily    Historical Provider, MD   Biotin 4098110000 MCG TABS Take 1 tablet by mouth daily    Historical Provider, MD   vitamin C (ASCORBIC ACID) 500 MG tablet Take 2 tablets by mouth 2 times daily    Historical Provider, MD   Multiple Vitamins-Minerals (PRESERVISION AREDS 2  PO) Take 1 tablet by mouth in the morning and at bedtime    Historical Provider, MD   D-Mannose 500 MG CAPS Take 2,000 mg by mouth in the morning and at bedtime    Historical Provider, MD   acetaminophen (TYLENOL) 325 MG tablet Take 2 tablets by mouth every 6 hours as needed for Pain    Historical Provider, MD   raNITIdine HCl (RANITIDINE 150 MAX STRENGTH PO) Take by mouth as needed    Historical Provider, MD   Alpha-D-Galactosidase (BEANO PO) Take by mouth as needed    Historical Provider, MD   SIMETHICONE PO Take by mouth as needed    Historical Provider, MD       Current medications:    No current facility-administered medications for this encounter.       Allergies:    Allergies   Allergen Reactions   . Gluten      CELIAC DISEASE   . Adhesive Tape Rash       Problem List:    Patient Active Problem List   Diagnosis Code   . PAC (premature atrial contraction) I49.1   . PVC (premature ventricular contraction) I49.3   . Palpitation R00.2       Past Medical History:        Diagnosis Date   . Anxiety and depression    .  Arthritis    . Carpal tunnel syndrome    . Celiac disease    . Chronic UTI    . CKD (chronic kidney disease) stage 3, GFR 30-59 ml/min (HCC)    . Essential tremor    . Exocrine pancreatic insufficiency    . GERD (gastroesophageal reflux disease)    . Hypertension    . Hypothyroidism    . IBS (irritable bowel syndrome)    . Macular degeneration    . Osteoporosis    . PSVT (paroxysmal supraventricular tachycardia) ([place])    . Vitamin D deficiency        Past Surgical History:        Procedure Laterality Date   . BREAST RECONSTRUCTION  1982   . CARDIAC CATHETERIZATION  2007   . CARPAL TUNNEL RELEASE Right 1995   . CARPAL TUNNEL RELEASE Left 2015   . CATARACT REMOVAL Right 2018   . CATARACT REMOVAL Left 2018   . DILATION AND CURETTAGE OF UTERUS  1975   . GASTRIC BYPASS SURGERY  2000    LAPAROSCOPIC, GASTRIC BYPASS REPAIR 2001   . HEMORRHOID SURGERY  1978    WITH REPAIR OF ANAL ULCER   . HYSTERECTOMY, TOTAL ABDOMINAL (CERVIX REMOVED)  1976   . INCISIONAL HERNIA REPAIR  2016    LUMBAR, ALSO REPAIR FAILED HERNIA SURGERY 2016   . KYPHOSIS SURGERY  2014   . LUMBAR FUSION  2012    L4,5   . LUMBAR FUSION  2015    L3,4,5   . MASTECTOMY, MODIFIED RADICAL Left 1979   . ORIF WRIST FRACTURE  03/2018   . REPAIR RETINAL TEAR/HOLE  2006   . ROTATOR CUFF REPAIR Right 2005   . SIGMOIDECTOMY  2018   . SPINAL CORD STIMULATOR IMPLANT  10/2018       Social History:    Social History     Tobacco Use   . Smoking status: Former     Types: Cigarettes   . Smokeless tobacco: Never   Substance Use Topics   . Alcohol use: Yes     Comment: RARELY  Counseling given: Not Answered      Vital Signs (Current): There were no vitals filed for this visit.                                           BP Readings from Last 3 Encounters:   11/02/21 (!) 147/82       NPO Status:                                                                                 BMI:   Wt Readings from Last 3 Encounters:   11/02/21 92.8 kg (204 lb 9.4 oz)      There is no height or weight on file to calculate BMI.    CBC:   Lab Results   Component Value Date/Time    WBC 7.1 11/02/2021 02:18 PM    RBC 4.36 11/02/2021 02:18 PM    HGB 12.4 11/02/2021 02:18 PM    HCT 40.9 11/02/2021 02:18 PM    MCV 93.8 11/02/2021 02:18 PM    RDW 15.1 11/02/2021 02:18 PM    PLT 237 11/02/2021 02:18 PM       CMP:   Lab Results   Component Value Date/Time    NA 138 11/02/2021 02:18 PM    K 4.1 11/02/2021 02:18 PM    CL 104 11/02/2021 02:18 PM    CO2 31 11/02/2021 02:18 PM    BUN 16 11/02/2021 02:18 PM    CREATININE 1.22 11/02/2021 02:18 PM    LABGLOM 44 11/02/2021 02:18 PM    GLUCOSE 88 11/02/2021 02:18 PM    CALCIUM 9.1 11/02/2021 02:18 PM       POC Tests: No results for input(s): "POCGLU", "POCNA", "POCK", "POCCL", "POCBUN", "POCHEMO", "POCHCT" in the last 72 hours.    Coags:   Lab Results   Component Value Date/Time    PROTIME 10.9 11/02/2021 02:18 PM    INR 1.0 11/02/2021 02:18 PM    APTT 41.0 11/02/2021 02:18 PM       HCG (If Applicable): No results found for: "PREGTESTUR", "PREGSERUM", "HCG", "HCGQUANT"     ABGs: No results found for: "PHART", "PO2ART", "PCO2ART", "HCO3ART", "BEART", "O2SATART"     Type & Screen (If Applicable):  No results found for: "LABABO", "LABRH"    Drug/Infectious Status (If Applicable):  No results found for: "HIV", "HEPCAB"    COVID-19 Screening (If Applicable): No results found for: "COVID19"        Anesthesia Evaluation  Patient summary reviewed and Nursing notes reviewed no history of anesthetic complications:   Airway: Mallampati: II  TM distance: >3 FB   Neck ROM: full  Mouth opening: > = 3 FB   Dental: normal exam         Pulmonary:Negative Pulmonary ROS breath sounds clear to auscultation                             Cardiovascular:Negative CV ROS    (+) hypertension:, dysrhythmias: PVC and PAC,  Rhythm: regular                      Neuro/Psych:   Negative Neuro/Psych ROS  (+) psychiatric history: stable with treatment            GI/Hepatic/Renal:  Neg GI/Hepatic/Renal ROS  (+) GERD: well controlled, renal disease: CRI,           Endo/Other: Negative Endo/Other ROS   (+) hypothyroidism::., .                 Abdominal:              PE comment: Deferred   Vascular: negative vascular ROS.         Other Findings:           Anesthesia Plan      general     ASA 2       Induction: intravenous.    MIPS: Prophylactic antiemetics administered.  Anesthetic plan and risks discussed with patient.      Plan discussed with CRNA.    Attending anesthesiologist reviewed and agrees with Preprocedure content                Jeannette How, MD   11/09/2021

## 2021-11-09 NOTE — Brief Op Note (Signed)
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]     * Capsular contracture of breast implant, initial encounter [T85.44XA]     * Leakage of breast implant, initial encounter [T85.43XA]     * Personal history of malignant neoplasm of breast [Z85.3]     * Status post left mastectomy [Z90.12]     * Mastodynia [N64.4]    Post-Op Diagnosis: Same       Procedure(s):  REVISION OF RECONSTRUCTED BREAST WITH REMOVAL OF LEAKING GEL IMPLANT, CAPSULECTOMY AND RECONSTRUCTION LEFT BREAST WITH NEW GEL IMPLANT    Surgeon(s):  Verlene Mayer, MD    Assistant:  Surgical Assistant: Everlene Other; Alfonzo Beers    Anesthesia: General    Estimated Blood Loss (mL): less than 50     Complications: None    Specimens:   ID Type Source Tests Collected by Time Destination   A : LEFT BREAST CAPSULE Tissue Tissue SURGICAL PATHOLOGY Corrinne Eagle III, MD 2021/11/11 1335        Implants:  Implant Name Type Inv. Item Serial No. Manufacturer Lot No. LRB No. Used Action   IMPLANT BRST GEL 3.9 CM PROJCT 17 CM 680 CC SMOOTH RND - [id number] Breast IMPLANT BRST GEL 3.9 CM PROJCT 17 CM 680 CC [employer] RND [id number] [employer] [id number] Left 1 Implanted         Drains: * 1 JP left    Findings: leaking silicone gel breast implant. All leaking gel removed with complete capsulectomy as well. Nice result with new implant in the OR.      Electronically signed by Beatris Ship, MD on 11-11-2021 at 2:47 PM

## 2021-11-09 NOTE — Progress Notes (Deleted)
Feels well. Eating better. Afebrile. Wound VAC in place.     Plan to debride the wound and place a split thickness skin graft tomorrow if the wound looks good enough. Plan wound VAC placement for skin graft bolster.    I discussed the planned procedure with the patient and her husband in detail including the scars, risks, healing process, potential complications and post op recovery.  They appear to understand.

## 2021-11-09 NOTE — H&P (Signed)
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  Past medical history:   Past Medical History:   Diagnosis Date    Anxiety and depression     Arthritis     Carpal tunnel syndrome     Celiac disease     Chronic UTI     CKD (chronic kidney disease) stage 3, GFR 30-59 ml/min (HCC)     Essential tremor     Exocrine pancreatic insufficiency     GERD (gastroesophageal reflux disease)     Hypertension     Hypothyroidism     IBS (irritable bowel syndrome)     Macular degeneration     Osteoporosis     PSVT (paroxysmal supraventricular tachycardia) ([place])     Vitamin D deficiency       Past surgical history:   Past Surgical History:   Procedure Laterality Date    BREAST RECONSTRUCTION  1982    CARDIAC CATHETERIZATION  2007    CARPAL TUNNEL RELEASE Right 1995    CARPAL TUNNEL RELEASE Left 2015    CATARACT REMOVAL Right 2018    CATARACT REMOVAL Left 2018    DILATION AND CURETTAGE OF UTERUS  1975    GASTRIC BYPASS SURGERY  2000    LAPAROSCOPIC, GASTRIC BYPASS REPAIR 2001    HEMORRHOID SURGERY  1978    WITH REPAIR OF ANAL ULCER    HYSTERECTOMY, TOTAL ABDOMINAL (CERVIX REMOVED)  1976    INCISIONAL HERNIA REPAIR  2016    LUMBAR, ALSO REPAIR  FAILED HERNIA SURGERY 2016    KYPHOSIS SURGERY  2014    LUMBAR FUSION  2012    L4,5    LUMBAR FUSION  2015    L3,4,5    MASTECTOMY, MODIFIED RADICAL Left 1979    ORIF WRIST FRACTURE  03/2018    REPAIR RETINAL TEAR/HOLE  2006    ROTATOR CUFF REPAIR Right 2005    SIGMOIDECTOMY  2018    SPINAL CORD STIMULATOR IMPLANT  10/2018      Family history: History reviewed. No pertinent family history.   Social history:   Social History     Socioeconomic History    Marital status: Married     Spouse name: Not on file    Number of children: Not on file    Years of education: Not on file    Highest education level: Not on file   Occupational History    Not on file   Tobacco Use    Smoking status: Former     Types: Cigarettes    Smokeless tobacco: Never   Vaping Use    Vaping Use: Never used   Substance and Sexual Activity    Alcohol use: Yes     Comment: RARELY    Drug use: Never  Sexual activity: Not on file   Other Topics Concern    Not on file   Social History Narrative    Not on file     Social Determinants of Health     Financial Resource Strain: Not on file   Food Insecurity: Not on file   Transportation Needs: Not on file   Physical Activity: Not on file   Stress: Not on file   Social Connections: Not on file   Intimate Partner Violence: Not on file   Housing Stability: Not on file      Home Medications:   Prior to Admission medications    Medication Sig Start Date End Date Taking? Authorizing Provider   levothyroxine (SYNTHROID) 125 MCG tablet Take 1 tablet by mouth every morning  Patient not taking: Reported on 11/09/2021    Historical Provider, MD   pantoprazole (PROTONIX) 20 MG tablet Take 1 tablet by mouth every morning    Historical Provider, MD   propranolol (INDERAL) 20 MG tablet Take 1 tablet by mouth 3 times daily    Historical Provider, MD   citalopram (CELEXA) 40 MG tablet Take 1 tablet by mouth Daily with lunch    Historical Provider, MD   buPROPion (WELLBUTRIN SR) 150 MG extended release tablet Take 1 tablet  by mouth 2 times daily    Historical Provider, MD   losartan (COZAAR) 25 MG tablet Take 1 tablet by mouth Daily with lunch    Historical Provider, MD   HYDROcodone-acetaminophen (NORCO) 5-325 MG per tablet Take 0.5-1 tablets by mouth every 12 hours as needed for Pain.    Historical Provider, MD   gabapentin (NEURONTIN) 600 MG tablet Take 1 tablet by mouth 3 times daily. Max Daily Amount: 1,800 mg    Historical Provider, MD   diclofenac (VOLTAREN) 75 MG EC tablet Take 1 tablet by mouth daily    Historical Provider, MD   calcitRIOL (ROCALTROL) 0.25 MCG capsule Take 1 capsule by mouth three times a week    Historical Provider, MD   cephALEXin (KEFLEX) 250 MG capsule Take 1 capsule by mouth Daily with lunch    Historical Provider, MD   lipase-protease-amylase (CREON) 36000-114000 units CPEP delayed release capsule Take 2 capsules by mouth 3 times daily (with meals) AND 1 CAPSULE WITH SNACKS, MAX 8/DAY    Historical Provider, MD   Lactobacillus (ACIDOPHILUS/BIFIDUS PO) Take 1 tablet by mouth daily    Historical Provider, MD   pyridoxine (B-6) 100 MG tablet Take 0.5 tablets by mouth 2 times daily    Historical Provider, MD   vitamin B-12 (CYANOCOBALAMIN) 500 MCG tablet Take 1 tablet by mouth four times a week    Historical Provider, MD   aspirin 325 MG tablet Take 1 tablet by mouth Daily with lunch    Historical Provider, MD   Calcium Citrate-Vitamin D (CALCIUM CITRATE+D3 PO) Take 630 mg by mouth in the morning and at bedtime    Historical Provider, MD   VITAMIN D PO Take 1,000 Int'l Units/1.92m2 by mouth 2 times daily    Historical Provider, MD   Biotin 32992 MCG TABS Take 1 tablet by mouth daily    Historical Provider, MD   vitamin C (ASCORBIC ACID) 500 MG tablet Take 2 tablets by mouth 2 times daily    Historical Provider, MD   Multiple Vitamins-Minerals (PRESERVISION AREDS 2 PO) Take 1 tablet by mouth in the morning and at bedtime    Historical Provider, MD   D-Mannose 500 MG CAPS  Take 2,000 mg by mouth in the morning  and at bedtime    Historical Provider, MD   acetaminophen (TYLENOL) 325 MG tablet Take 2 tablets by mouth every 6 hours as needed for Pain    Historical Provider, MD   raNITIdine HCl (RANITIDINE 150 MAX STRENGTH PO) Take by mouth as needed    Historical Provider, MD   Alpha-D-Galactosidase (BEANO PO) Take by mouth as needed    Historical Provider, MD   SIMETHICONE PO Take by mouth as needed    Historical Provider, MD      Allergies:   Allergies   Allergen Reactions    Gluten      CELIAC DISEASE    Adhesive Tape Rash      Review of systems:  Denies headache, fever, chills, weight change, congestion, sore throat, chest pain, shortness of breath, nausea, vomiting, diarrhea, constipation, abdominal pain, generalized weakness, muscle or joint pain, and rash.    Physical Exam:  Vitals: Blood pressure (!) 140/95, pulse 61, temperature 98 F (36.7 C), temperature source Oral, resp. rate 16, SpO2 98 %.   General: awake and alert, NAD  Neck: supple  Breasts:  Reconstructed left breast with Baker 4 capsule and healthy looking latis skin island. No masses. No palpable lymph nodes.  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

## 2021-11-09 NOTE — Other (Signed)
TRANSFER - OUT REPORT:    Verbal report given to Maheen(name) on Drinda Butts  being transferred to (unit) for routine post-op       Report consisted of patient's Situation, Background, Assessment and   Recommendations(SBAR).     Time Pre op antibiotic given:1420  Anesthesia Stop time: 8099    Information from the following report(s) SBAR, OR Summary, Procedure Summary, Intake/Output, MAR, and Cardiac Rhythm SR  was reviewed with the receiving nurse.    Opportunity for questions and clarification was provided.     Is the patient on 02? Yes       L/Min 2       Other N/A    Is the patient on a monitor? No    Is the nurse transporting with the patient? No    Surgical Waiting Area notified of patient's transfer from PACU? Yes

## 2021-11-09 NOTE — Progress Notes (Signed)
While getting report from PACU nurse, noted that attending MD put in a note stating patient had wound VAC and patient to be NPO at midnight for skin graft tomorrow.     Clarified with PACU nurse who stated that patient doesn't have a wound vac and only a left JP drain.     [employer] attending to clarify. MD stated that he had put in the note under the wrong patient. This patient does NOT have a wound vac and no skin graft procedure is scheduled for her. Telephone orders received to go ahead and discontinue NPO at midnight orders and to discontinue consent order as well.     Patient okay to be on a regular diet.     TRANSFER - IN REPORT:    Verbal report received from T. Timmothy Sours, RN on Drinda Butts  being received from PACU for routine post-op      Report consisted of patient's Situation, Background, Assessment and   Recommendations(SBAR).     Information from the following report(s) Nurse Handoff Report, Index, Adult Overview, Surgery Report, Intake/Output, MAR, and Recent Results was reviewed with the receiving nurse.    Opportunity for questions and clarification was provided.      Assessment completed upon patient's arrival to unit and care assumed.

## 2021-11-09 NOTE — Progress Notes (Signed)
1930: Bedside and Verbal shift change report given to C.Petra Dumler, RN (oncoming nurse) by M.Tajuddin, RN (offgoing nurse). Report included the following information Nurse Handoff Report, Index, Adult Overview, Surgery Report, Intake/Output, MAR, and Recent Results.

## 2021-11-09 NOTE — Other (Signed)
Pt left lower leg has 1+edema and is tender in places.  Pt states it has been that way for awhile.  Dr Zachary George aware.  Ted hose ordered and applied.

## 2021-11-09 NOTE — Other (Signed)
11/09/21 1325   Family Communication   Contact Person Relationship to Patient Daughter   Contact Person Phone Number Amy Galloway [phone]   Family/Significant Other Update Called   Delivery Origin Nurse   Message Disposition Family present - message delivered   Update Given Yes   Family Communication   Family Update Message Procedure started

## 2021-11-10 MED FILL — THERAPEUTIC-M/LUTEIN PO TABS: ORAL | Qty: 1

## 2021-11-10 MED FILL — PANTOPRAZOLE SODIUM 20 MG PO TBEC: 20 MG | ORAL | Qty: 1

## 2021-11-10 MED FILL — BUPROPION HCL ER (SR) 150 MG PO TB12: 150 MG | ORAL | Qty: 1

## 2021-11-10 MED FILL — ACETAMINOPHEN 325 MG PO TABS: 325 MG | ORAL | Qty: 2

## 2021-11-10 MED FILL — GABAPENTIN 300 MG PO CAPS: 300 MG | ORAL | Qty: 2

## 2021-11-10 MED FILL — CEFAZOLIN SODIUM 1 G IJ SOLR: 1 g | INTRAMUSCULAR | Qty: 2000

## 2021-11-10 MED FILL — KCL-LACTATED RINGERS-D5W 20 MEQ/L IV SOLN: 20 MEQ/L | INTRAVENOUS | Qty: 1000

## 2021-11-10 MED FILL — PROPRANOLOL HCL 20 MG PO TABS: 20 MG | ORAL | Qty: 1

## 2021-11-10 MED FILL — CITALOPRAM HYDROBROMIDE 20 MG PO TABS: 20 MG | ORAL | Qty: 2

## 2021-11-10 MED FILL — HYDROCODONE-ACETAMINOPHEN 5-325 MG PO TABS: 5-325 MG | ORAL | Qty: 1

## 2021-11-10 NOTE — Discharge Instructions (Signed)
JACKSON-PRATT DRAIN CARE - HOME CARE INSTRUCTIONS      You are going home with a drain called a Jackson-Pratt (sometimes called a "J.P.").  You will need to empty and measure the drainage during the first few days of recovery.   In the picture below, # 1 indicates where your drain is connected to the reservoir.  You will not need to work with this part of the drain.  See the instructions below for references to # 3 and #4.             Empty the drain whenever it is close to full or at least 3 times per day     Wash your hands prior to emptying the drain   Un-pin the drainage reservoir (the lined container) from your   clothing     Hold the reservoir with the emptying port at the top (#3)   Remove the drainage plug (#4) from the emptying port (#3)   Estimate the amount of drainage by the gradation   marks on the side of the reservoir   Record the drainage amount on the Drainage Record  on the reverse side of this form   Empty the drainage into the toilet   Squeeze the reservoir and replug it (recharge)   Remember, every time the drainage reservoir is fully expanded it is considered uncharged and it should be emptied, squeezed and replugged (recharged)     To prevent infection, avoid touching the end of the emptying port (#3) and the drainage plug (#4) with your hands (see diagram).  For the drain to work it needs to be in the "squeezed" position.  Keep the area around the tube dry and covered with a dressing.  The color and amount of drainage may change.  When you return for your post operative visit please bring the Record with you.  When the drainage is less than 30 - 40 cc in a 24-hour period OR when the drains     have been in for 2 weeks, please call the office [phone]) to schedule removal.  If you have any questions or concerns about your drain or the above instructions     please call our office at [phone].    [facility]  JACKSON-PRATT DRAINAGE RECORD    Patient Name:   __________________________________________________    Amy Galloway # 1    Date   Amount   Amount   Amount Daily Total Amount                                                                                                           Drain # 2    Date   Amount   Amount   Amount Daily Total Amount  Channing Mutters, M.D., F.A.C.S.  Barnetta Hammersmith, M.D., F.A.C.S.,  Erlean Mealor Milbert Coulter, III, M.D., F.A.C.S.  Annie Paras, M.D.,  Salomon Mast, M.D.      POSTOPERATIVE INSTRUCTIONS    Breast Reconstruction      1. You must have a responsible adult with you for the first 24 hours after surgery.    2. Please wear loose clothing that does not pull over your head on the day of your surgery.    3. Take clear liquids until nausea has subsided. You may resume normal diet once nausea has subsided.    4. Bed rest is recommended for the first 24 hours. You may be up for meals and go to the bathroom with assistance. Minimize lifting and arm extension for the first 2 weeks. No lifting over 10 pounds for the first 2 weeks.     5. Wear your support (TED) hose for the first 2-3 days until moving about normally.     6. Take your prescription medication as directed for 2-3 days and then as needed. Do not use any medications containing aspirin or ibuprofen for 2 weeks. You may take non aspirin or non ibuprofen headache medication such as Tylenol Extra Strength. You may resume your normal medications. You have been prescribed ______________________________.    7. Do no drive a car or operate heavy machinery while on the pain medication.    8. Do not drink alcohol or take tranquilizers while on the pain medication.    9. You may resume work in ____ weeks. You may resume driving when you are moving normally and no longer on pain medication.     10. You may remove the bra and dressings and shower in _24___ hours and daily  thereafter.   Keep the area dry where the JP drain comes out of your body and replace a light gauze dressing over it.  OK to cover your incision with a light maxipad daily. Do NOT remove the white strips of tape over your incisions if they have been placed over your incision, just pat dry and replace the bra afterwards. Wear the bra 24 hours a day for _2___ weeks. Apply ice to the incision for 24 hours. You may wash, dry, and reapply garment. Keep your dressings clean and replace when soiled or wet. You may apply a light dressing for comfort under your bra.    11. You may find it more comfortable to sleep in a recliner for the first couple of nights.      12. If you have increasing pain or swelling on one side or the other with a feeling of tightness, call the office at [phone].    13. You will need to return to the office in 2 weeks for your postop appointment and suture removal. Your appointment is _________________.    14. You will be asked to start massaging your breasts daily starting in 2 weeks after surgery to keep the implant pockets loose and implants soft. This gives a good self exam also. You will be instructed in this procedure on your postop visit to the doctor.    15. Please call the office at [phone] if you have any questions after the surgery.    16. COMPLICATIONS:  Call the office at [phone] if any of the following occur-  Fever over 101 degrees taken by mouth or 100 degrees rectally.  Pain not relieved by medications prescribed.  Blood soaked dressing (small amounts of oozing may  be normal.)  Unable to urinate or completely empty your bladder.  Nausea or vomiting lasting longer than 24 hours.  Numbness, tingling or cold fingers or toes.  Swelling around incision.  Increasing and progressive drainage from incision or exam site.  Increased redness, warmth or hardness around incision or exam site  If you experience shortness of breath, chest pain or swelling of legs or feet, go immediately to the  emergency room      IF YOU NEED IMMEDIATE ATTENTION, GO TO [facility].      I acknowledge receipt of the above discharge instructions:    Patient/Guardian Signature _______________________________________ Date___________________    Nurse's Signature_______________________________________________ Date___________________

## 2021-11-10 NOTE — Progress Notes (Addendum)
Bedside and Verbal shift change report given to M. Roi Jafari, RN (Soil scientist) by C. Robb Matar, RN Engineer, drilling). Report included the following information Nurse Handoff Report, Index, Adult Overview, Surgery Report, Intake/Output, MAR, and Recent Results.      1335- I have reviewed discharge instructions with patient and daughter. Patient and daughter verbalized understanding and all questions answered

## 2021-11-10 NOTE — Discharge Summary (Signed)
Feels well. Up and around and voiding and eating well. Breast looks good with no hematoma and healing incision. Usual JP output.     Plan to discharge home later today. Resume preop meds.  She already has pain meds.    Usual wound and JP care see me in two weeks.

## 2021-11-10 NOTE — Plan of Care (Signed)
Problem: Physical Therapy - Adult  Goal: By Discharge: Performs mobility at highest level of function for planned discharge setting.  See evaluation for individualized goals.  Description: FUNCTIONAL STATUS PRIOR TO ADMISSION: Pt reports using a rollator for functional mobility at home with a significant fall hx and multiple injuries/fractures. Pt reports she is only ambulating brief distances in her home before her back pain prevents further activity. Pt reports her family assists her with steps and walking in home intermittently.    HOME SUPPORT PRIOR TO ADMISSION: Pt lives with her DTR (works from home) who assists with ADLs and mobility safety. Of note: pt's spouse has recently fractured his UE and she is also helping him. DTR may benefit from supportive resources in home setting. Pt open to HHPT/OT.    Physical Therapy Goals  Initiated 11/10/2021  1.  Patient will move from supine to sit and sit to supine in bed with modified independence within 7 day(s).    2.  Patient will perform sit to stand with supervision/set-up within 7 day(s).  3.  Patient will transfer from bed to chair and chair to bed with supervision/set-up using the least restrictive device within 7 day(s).  4.  Patient will ambulate with supervision/set-up for 25 feet with the least restrictive device within 7 day(s).   5.  Patient will ascend/descend 5 stairs with B handrail(s) per home needs with minimal assistance within 7 day(s).    Outcome: Progressing   PHYSICAL THERAPY EVALUATION    Patient: Amy Galloway ([age] y.o. 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]  Capsular contracture of breast implant, initial encounter [T85.44XA]  Leakage of breast implant, initial encounter [T85.43XA]  Personal history of malignant neoplasm of breast [Z85.3]  Status post left mastectomy [Z90.12]  Mastodynia [N64.4]  Complication of breast implant, sequela [T85.49XS]  Procedure(s) (LRB):  REVISION OF RECONSTRUCTED  BREAST WITH REMOVAL OF LEAKING GEL IMPLANT, CAPSULECTOMY AND RECONSTRUCTION LEFT BREAST WITH NEW GEL IMPLANT (N/A) 1 Day Post-Op   Precautions: Fall Risk (L JP drain)                    ASSESSMENT :   DEFICITS/IMPAIRMENTS:   The patient is limited by decreased ROM, strength, sensation, activity tolerance, coordination, balance, increased pain levels resulting in decreased functional mobility safety and independence. Per pt report, she has been unsteady and suffered multiple falls at baseline. OH suspected from reports of dizziness, though BP stable throughout transfers and gait training this morning. Pt in chair in NAD when session ended. Anticipate pt will be able to return home with her DTR's assistance once medically stable; however, pt will benefit from HHPT and HHOT. DTR is caring for her father as well, as he has suffered a recent injury.    Based on the impairments listed above pt remains at a significant risk for falls.    Patient will benefit from skilled intervention to address the above impairments.    Functional Outcome Measure:  The patient scored 18 on the AMPAC outcome measure which is indicative of Cutoff score ?171,2,3 had higher odds of discharging home with home health or need of SNF/IPR.           PLAN :  Recommendations and Planned Interventions:   bed mobility training, transfer training, gait training, therapeutic exercises, edema management/control, patient and family training/education, and therapeutic activities    Frequency/Duration: Patient will be followed by physical therapy to address goals, PT Plan of Care:  5 times/week to address goals.    Recommendation for discharge: (in order for the patient to meet his/her long term goals): Therapy 2 days/week in the home and also see "other factors to consider" below for additional discharge concerns/needs, HHPT and HHOT; if DTR is unable to properly care for pt and her father, pt may benefit from brief IPR stay prior to return home for  balance/gait training and safety    Other factors to consider for discharge: high risk for falls, concern for safely navigating or managing the home environment, and physical assistance for ADLs and mobility recommended    IF patient discharges home will need the following DME: patient owns DME required for discharge       SUBJECTIVE:   Patient stated "I have hit my head a few times and I broke both wrists..."    OBJECTIVE DATA SUMMARY:       Past Medical History:   Diagnosis Date    Anxiety and depression     Arthritis     Carpal tunnel syndrome     Celiac disease     Chronic UTI     CKD (chronic kidney disease) stage 3, GFR 30-59 ml/min (HCC)     Essential tremor     Exocrine pancreatic insufficiency     GERD (gastroesophageal reflux disease)     Hypertension     Hypothyroidism     IBS (irritable bowel syndrome)     Macular degeneration     Osteoporosis     PSVT (paroxysmal supraventricular tachycardia) (HCC)     Vitamin D deficiency      Past Surgical History:   Procedure Laterality Date    BREAST RECONSTRUCTION  1982    BREAST RECONSTRUCTION N/A 11/09/2021    REVISION OF RECONSTRUCTED BREAST WITH REMOVAL OF LEAKING GEL IMPLANT, CAPSULECTOMY AND RECONSTRUCTION LEFT BREAST WITH NEW GEL IMPLANT performed by Jetta Lout III, MD at [facility] MAIN OR    CARDIAC CATHETERIZATION  2007    CARPAL TUNNEL RELEASE Right 1995    CARPAL TUNNEL RELEASE Left 2015    CATARACT REMOVAL Right 2018    CATARACT REMOVAL Left 2018    DILATION AND CURETTAGE OF UTERUS  1975    GASTRIC BYPASS SURGERY  2000    LAPAROSCOPIC, GASTRIC BYPASS REPAIR 2001    HEMORRHOID SURGERY  1978    WITH REPAIR OF ANAL ULCER    HYSTERECTOMY, TOTAL ABDOMINAL (CERVIX REMOVED)  1976    INCISIONAL HERNIA REPAIR  2016    LUMBAR, ALSO REPAIR FAILED HERNIA SURGERY 2016    KYPHOSIS SURGERY  2014    LUMBAR FUSION  2012    L4,5    LUMBAR FUSION  2015    L3,4,5    MASTECTOMY, MODIFIED RADICAL Left 1979    ORIF WRIST FRACTURE  03/2018    REPAIR RETINAL TEAR/HOLE  2006     ROTATOR CUFF REPAIR Right 2005    SIGMOIDECTOMY  2018    SPINAL CORD STIMULATOR IMPLANT  10/2018       Home Situation:  Social/Functional History  Lives With: Spouse, Daughter  Type of Home: House  Home Layout: One level  Home Access: Stairs to enter with rails  Entrance Stairs - Number of Steps: 5  Entrance Stairs - Rails: Both  Bathroom Shower/Tub: Medical sales representative: Handicap height  Bathroom Equipment: Tub transfer bench, Grab bars in shower  Bathroom Accessibility: Accessible  Home Equipment: Walker, rolling, Cane, Rollator  Has the patient had  two or more falls in the past year or any fall with injury in the past year?: Yes  Receives Help From: Family  ADL Assistance: Needs assistance  Homemaking Assistance: Needs assistance  Ambulation Assistance: Needs assistance  Transfer Assistance: Needs assistance  Active Driver: No    Cognitive/Behavioral Status:  Orientation  Orientation Level: Appropriate for developmental age       Skin: No drainage noted from bra/binder area; JP intact    Edema: LLE>RLE; ted hose donned and RN reports little concern for DVT at this time    Hearing:   Hearing  Hearing: Exceptions to [facility]  Hearing Exceptions: Hard of hearing/hearing concerns    Strength:    Strength: Generally decreased, functional    Tone & Sensation:   Tone: Normal  Sensation: Impaired    Coordination:  Coordination: Generally decreased, functional    Range Of Motion:  AROM: Generally decreased, functional       Functional Mobility:  Bed Mobility:     Bed Mobility Training  Bed Mobility Training: No  Transfers:     Pharmacologist: Yes  Sit to Stand: Contact-guard assistance;Additional time (cues to push from support surface)  Stand to Sit: Contact-guard assistance;Additional time (cues for alignment, reach back, controlled descent)  Balance:               Balance  Sitting: Intact  Standing: Impaired  Standing - Static: Constant support;Fair  Standing - Dynamic: Constant  support;Fair  Ambulation/Gait Training:                       Gait  Overall Level of Assistance: Contact-guard assistance;Additional time  Interventions: Safety awareness training;Verbal cues  Base of Support: Center of gravity altered  Speed/Cadence: Slow;Shuffled  Step Length: Right shortened;Left shortened  Gait Abnormalities: Antalgic;Decreased step clearance;Trunk sway increased;Shuffling gait (pt with some LOB at RW (L); pt flexed at trunk and +back pain with brief ambulation)  Distance (ft): 15 Feet (+20)  Assistive Device: Gait belt;Walker, [place]  Basic Mobility Inpatient Short Form (6-Clicks) Version 2  How much HELP from another person do you currently need... (If the patient hasn't done an activity recently, how much help from another person do you think they would need if they tried?) Total A Lot A Little None   1.  Turning from your back to your side while in a flat bed without using bedrails?   1   2   3    4   2.  Moving from lying on your back to sitting on the side of a flat bed without using bedrails?   1   2   3    4   3.  Moving to and from a bed to a chair (including a wheelchair)?   1   2   3    4   4. Standing up from a chair using your arms (e.g. wheelchair or bedside chair)?   1   2   3    4   5.  Walking in hospital room?   1   2   3    4   6.  Climbing 3-5 steps with a railing?   1   2   3    4     Raw Score: 18/24                            Cutoff score ?171,2,3 had higher odds of discharging home with home health or need of SNF/IPR.    1. Emelia Loron, Janeece Riggers, Vinoth Fransico Meadow, Lupe Carney Passek, Thornton Dales. Cassandria Anger.  Validity of the  AM-PAC "6-Clicks" Inpatient Daily Activity and Basic Mobility Short Forms. Physical Therapy Mar 2014, [phone]) [phone]; DOI: 10.2522/ptj.20130199  2. Venetia Night. Association of AM-PAC "6-Clicks" Basic Mobility and Daily Activity Scores With Discharge Destination. Phys Ther. 2021 Apr 4;101(4):pzab043. doi: 10.1093/ptj/pzab043. PMID: 16109604.  3. Herbold J, Rajaraman D, Lubertha Basque, Agayby K, [place] S. Activity Measure for Post-Acute Care "6-Clicks" Basic Mobility Scores Predict Discharge Destination After Acute Care Hospitalization in Select Patient Groups: A Retrospective, Observational Study. Arch Rehabil Res Clin Transl. 2022 Jul 16;4(3):100204. doi: 10.1016/j.[email or web address].[email or web address]. PMID: 78295621; PMCID: [id number].  4. Josefina Do, Coster W, Ni P. AM-PAC Short Forms Manual 4.0. Revised 03/2018.  Pain Rating:  Pt reports pain is well controlled at this time    Activity Tolerance:   Poor and pt reports pain in back limiting her activity after approx 68ft- discussed activity titration for home    After treatment:   Patient left in no apparent distress sitting up in chair, Call bell within reach, and Heels elevated for pressure relief    COMMUNICATION/EDUCATION:   The patient's plan of care was discussed with: registered nurse    Patient Education  Education Given To: Patient  Education Provided: Role of Therapy;Home Exercise Program;Equipment;Precautions;Fall Prevention Strategies;Transfer Social worker Provided Comments: Pt educated on safety measures with regards to dizziness with activity (though pt neg for OH today)  Education Method: Verbal  Barriers to Learning: Hearing  Education Outcome: Verbalized understanding;Continued education  needed    Thank you for this referral.  Zola Runion L Halana Deisher, PT  Minutes: 51      Physical Therapy Evaluation Charge Determination   History Examination Presentation Decision-Making   MEDIUM  Complexity : 1-2 comorbidities / personal factors will impact the outcome/ POC  LOW Complexity : 1-2 Standardized tests and measures addressing body structure, function, activity limitation and / or participation in recreation  LOW Complexity : Stable, uncomplicated  AM-PAC  LOW    Based on the above components, the patient evaluation is determined to be of the following complexity level: Low
